# Patient Record
Sex: Female | Born: 1970 | Race: Black or African American | Hispanic: No | Marital: Married | Smoking: Former smoker
Health system: Southern US, Community
[De-identification: ages and names within clinical notes are randomized; demographics above are authoritative.]

## PROBLEM LIST (undated history)

## (undated) ENCOUNTER — Ambulatory Visit (HOSPITAL_COMMUNITY): Admission: EM

## (undated) DIAGNOSIS — R51 Headache: Secondary | ICD-10-CM

## (undated) DIAGNOSIS — I639 Cerebral infarction, unspecified: Secondary | ICD-10-CM

## (undated) DIAGNOSIS — I2510 Atherosclerotic heart disease of native coronary artery without angina pectoris: Secondary | ICD-10-CM

## (undated) DIAGNOSIS — I10 Essential (primary) hypertension: Secondary | ICD-10-CM

## (undated) DIAGNOSIS — E46 Unspecified protein-calorie malnutrition: Secondary | ICD-10-CM

## (undated) DIAGNOSIS — E8809 Other disorders of plasma-protein metabolism, not elsewhere classified: Secondary | ICD-10-CM

## (undated) DIAGNOSIS — Z87442 Personal history of urinary calculi: Secondary | ICD-10-CM

## (undated) DIAGNOSIS — I219 Acute myocardial infarction, unspecified: Secondary | ICD-10-CM

## (undated) DIAGNOSIS — E119 Type 2 diabetes mellitus without complications: Secondary | ICD-10-CM

## (undated) DIAGNOSIS — J189 Pneumonia, unspecified organism: Secondary | ICD-10-CM

## (undated) DIAGNOSIS — I214 Non-ST elevation (NSTEMI) myocardial infarction: Secondary | ICD-10-CM

## (undated) DIAGNOSIS — R519 Headache, unspecified: Secondary | ICD-10-CM

## (undated) DIAGNOSIS — S36209A Unspecified injury of unspecified part of pancreas, initial encounter: Secondary | ICD-10-CM

## (undated) HISTORY — DX: Unspecified protein-calorie malnutrition: E46

## (undated) HISTORY — DX: Atherosclerotic heart disease of native coronary artery without angina pectoris: I25.10

## (undated) HISTORY — PX: LAPAROSCOPIC CHOLECYSTECTOMY: SUR755

## (undated) HISTORY — PX: FRACTURE SURGERY: SHX138

## (undated) HISTORY — DX: Other disorders of plasma-protein metabolism, not elsewhere classified: E88.09

## (undated) HISTORY — DX: Non-ST elevation (NSTEMI) myocardial infarction: I21.4

## (undated) HISTORY — PX: TUBAL LIGATION: SHX77

---

## 2007-07-02 ENCOUNTER — Emergency Department (HOSPITAL_COMMUNITY): Admission: EM | Admit: 2007-07-02 | Discharge: 2007-07-03 | Payer: Self-pay | Admitting: Emergency Medicine

## 2007-07-30 HISTORY — PX: FACIAL RECONSTRUCTION SURGERY: SHX631

## 2007-08-08 ENCOUNTER — Emergency Department (HOSPITAL_COMMUNITY): Admission: EM | Admit: 2007-08-08 | Discharge: 2007-08-09 | Payer: Self-pay | Admitting: Emergency Medicine

## 2007-12-24 ENCOUNTER — Emergency Department (HOSPITAL_COMMUNITY): Admission: EM | Admit: 2007-12-24 | Discharge: 2007-12-25 | Payer: Self-pay | Admitting: Emergency Medicine

## 2008-03-22 ENCOUNTER — Emergency Department (HOSPITAL_COMMUNITY): Admission: EM | Admit: 2008-03-22 | Discharge: 2008-03-22 | Payer: Self-pay | Admitting: Emergency Medicine

## 2008-04-22 ENCOUNTER — Emergency Department (HOSPITAL_COMMUNITY): Admission: EM | Admit: 2008-04-22 | Discharge: 2008-04-22 | Payer: Self-pay | Admitting: Family Medicine

## 2008-05-26 ENCOUNTER — Emergency Department (HOSPITAL_COMMUNITY): Admission: EM | Admit: 2008-05-26 | Discharge: 2008-05-26 | Payer: Self-pay | Admitting: Emergency Medicine

## 2008-06-10 ENCOUNTER — Ambulatory Visit (HOSPITAL_COMMUNITY): Admission: RE | Admit: 2008-06-10 | Discharge: 2008-06-10 | Payer: Self-pay | Admitting: Oral Surgery

## 2008-06-27 ENCOUNTER — Emergency Department (HOSPITAL_COMMUNITY): Admission: EM | Admit: 2008-06-27 | Discharge: 2008-06-27 | Payer: Self-pay | Admitting: Emergency Medicine

## 2008-07-31 ENCOUNTER — Emergency Department (HOSPITAL_COMMUNITY): Admission: EM | Admit: 2008-07-31 | Discharge: 2008-07-31 | Payer: Self-pay | Admitting: Family Medicine

## 2008-11-25 ENCOUNTER — Emergency Department (HOSPITAL_COMMUNITY): Admission: EM | Admit: 2008-11-25 | Discharge: 2008-11-25 | Payer: Self-pay | Admitting: Emergency Medicine

## 2009-01-09 ENCOUNTER — Emergency Department (HOSPITAL_COMMUNITY): Admission: EM | Admit: 2009-01-09 | Discharge: 2009-01-09 | Payer: Self-pay | Admitting: Family Medicine

## 2009-08-29 ENCOUNTER — Emergency Department (HOSPITAL_COMMUNITY): Admission: EM | Admit: 2009-08-29 | Discharge: 2009-08-29 | Payer: Self-pay | Admitting: Family Medicine

## 2009-09-24 ENCOUNTER — Emergency Department (HOSPITAL_COMMUNITY): Admission: EM | Admit: 2009-09-24 | Discharge: 2009-09-24 | Payer: Self-pay | Admitting: Emergency Medicine

## 2010-04-07 ENCOUNTER — Emergency Department (HOSPITAL_COMMUNITY): Admission: EM | Admit: 2010-04-07 | Discharge: 2010-04-07 | Payer: Self-pay | Admitting: Emergency Medicine

## 2010-11-07 LAB — GLUCOSE, CAPILLARY: Glucose-Capillary: 172 mg/dL — ABNORMAL HIGH (ref 70–99)

## 2010-12-11 NOTE — Op Note (Signed)
[name], [name]             ACCOUNT NO.:  [id number]   MEDICAL RECORD NO.:  [id number]          PATIENT TYPE:  AMB   LOCATION:  SDS                          FACILITY:  [facility]   PHYSICIAN:  Georgia Lopes, M.D.  DATE OF BIRTH:  Aug 19, 1970   DATE OF PROCEDURE:  06/10/2008  DATE OF DISCHARGE:  06/10/2008                               OPERATIVE REPORT   PREOPERATIVE DIAGNOSIS:  Left zygoma fracture.   POSTOPERATIVE DIAGNOSIS:  Left zygoma fracture.   PROCEDURE:  Open reduction left zygomatic arch fracture.   SURGEON:  Georgia Lopes, MD   ANESTHESIA:  General oral.   ESTIMATED BLOOD LOSS:  Minimal.   COMPLICATIONS:  None.   SPECIMENS:  None.   PROCEDURE:  The patient was taken to the operating room and placed on  the table on supine position.  General anesthesia was administered  intravenously and oral endotracheal tube was placed and secured, the  eyelids are lubricated and protected.  The table was turned and the  patient was prepped and draped after shaving a small area in the  temporal region in the left side.  Then 1% lidocaine of 1:100,000  epinephrine was infiltrated total 4.5 mL in the left temporal region.  Then a #15 blade was used to make a 3-cm incision through the skin and  subcutaneous tissue.  Bleeding vessels were cauterized with Bovie  electrocautery.  Then dissection was carried down to the deep layer of  temporal fascia, and the plane was developed here at the wound  inferiorly under the zygomatic arch region using a Therapist, nutritional.  Then  a zygomatic arch elevator was placed so that the tip of the instrument  was underneath the depressed zygomatic arch fracture and bimanual  elevating pressure was used to reduce the fracture.  Then, the area was  cauterized and closed with 4-0 chromic and 4-0 Prolene for skin.  The  patient was extubated and taken to the recovery room breathing  spontaneously and in good condition.     ______________________________  Georgia Lopes, M.D.     SMJ/MEDQ  D:  06/10/2008  T:  06/10/2008  Job:  [id number]

## 2011-04-18 LAB — URINE CULTURE

## 2011-04-18 LAB — URINE MICROSCOPIC-ADD ON

## 2011-04-18 LAB — POCT CARDIAC MARKERS: Operator id: 288831

## 2011-04-18 LAB — DIFFERENTIAL
Basophils Relative: 0
Lymphocytes Relative: 37
Lymphs Abs: 3.8
Monocytes Absolute: 0.5
Monocytes Relative: 5

## 2011-04-18 LAB — CBC
HCT: 46.5 — ABNORMAL HIGH
Hemoglobin: 15.7 — ABNORMAL HIGH
MCV: 94

## 2011-04-18 LAB — BASIC METABOLIC PANEL
BUN: 7
Calcium: 9.5
GFR calc Af Amer: >60
GFR calc non Af Amer: >60

## 2011-04-18 LAB — URINALYSIS, ROUTINE W REFLEX MICROSCOPIC
Glucose, UA: >1000 — AB
Hgb urine dipstick: NEGATIVE
Protein, ur: NEGATIVE
Specific Gravity, Urine: 1.03
pH: 6

## 2011-04-24 LAB — CBC
HCT: 40.9
MCV: 94.1
RBC: 4.35
RDW: 13

## 2011-04-24 LAB — URINE MICROSCOPIC-ADD ON

## 2011-04-24 LAB — POCT I-STAT, CHEM 8
BUN: 8
Calcium, Ion: 1.13
Chloride: 101
Creatinine, Ser: 0.6
Glucose, Bld: 383 — ABNORMAL HIGH
HCT: 41
Hemoglobin: 13.9
Potassium: 2.9 — ABNORMAL LOW
Sodium: 139
TCO2: 27

## 2011-04-24 LAB — DIFFERENTIAL
Basophils Absolute: 0.1
Eosinophils Absolute: 0.2
Eosinophils Relative: 3
Lymphs Abs: 3.1

## 2011-04-24 LAB — HCG, QUANTITATIVE, PREGNANCY: hCG, Beta Chain, Quant, S: <2

## 2011-04-24 LAB — URINALYSIS, ROUTINE W REFLEX MICROSCOPIC
Bilirubin Urine: NEGATIVE
Ketones, ur: 40 — AB
Nitrite: NEGATIVE
pH: 7

## 2011-04-24 LAB — GC/CHLAMYDIA PROBE AMP, GENITAL
Chlamydia, DNA Probe: NEGATIVE
GC Probe Amp, Genital: NEGATIVE

## 2011-04-24 LAB — WET PREP, GENITAL: Yeast Wet Prep HPF POC: NONE SEEN

## 2011-04-29 LAB — GLUCOSE, CAPILLARY

## 2011-04-29 LAB — URINALYSIS, ROUTINE W REFLEX MICROSCOPIC
Bilirubin Urine: NEGATIVE
Hgb urine dipstick: NEGATIVE
Specific Gravity, Urine: 1.041 — ABNORMAL HIGH
pH: 6.5

## 2011-04-29 LAB — POCT I-STAT, CHEM 8
BUN: 9
Chloride: 106
Creatinine, Ser: 0.7
Glucose, Bld: 334 — ABNORMAL HIGH
Potassium: 3.9
Sodium: 140

## 2011-04-29 LAB — URINE MICROSCOPIC-ADD ON

## 2011-04-30 LAB — URINE MICROSCOPIC-ADD ON

## 2011-04-30 LAB — POCT CARDIAC MARKERS
CKMB, poc: <1 — ABNORMAL LOW
Myoglobin, poc: 37.3
Troponin i, poc: <0.05

## 2011-04-30 LAB — CBC
HCT: 47.1 — ABNORMAL HIGH
MCV: 93.7
MCV: 95.8
Platelets: 267
Platelets: 270
RBC: 4.86
RDW: 13.1
WBC: 5.1
WBC: 8.8

## 2011-04-30 LAB — BASIC METABOLIC PANEL
BUN: 6
Chloride: 104
Creatinine, Ser: 0.65
GFR calc non Af Amer: >60
Glucose, Bld: 261 — ABNORMAL HIGH
Potassium: 4

## 2011-04-30 LAB — URINALYSIS, ROUTINE W REFLEX MICROSCOPIC
Bilirubin Urine: NEGATIVE
Hgb urine dipstick: NEGATIVE
Ketones, ur: 15 — AB
Specific Gravity, Urine: >1.046 — ABNORMAL HIGH
pH: 6

## 2011-04-30 LAB — DIFFERENTIAL
Basophils Absolute: 0.1
Eosinophils Absolute: 0.2
Eosinophils Relative: 2
Lymphocytes Relative: 38
Monocytes Absolute: 0.4

## 2011-04-30 LAB — COMPREHENSIVE METABOLIC PANEL
ALT: 13
AST: 16
Albumin: 3.7
CO2: 25
Chloride: 105
Creatinine, Ser: 0.42
GFR calc Af Amer: >60
Potassium: 3.3 — ABNORMAL LOW
Sodium: 137
Total Bilirubin: 0.8

## 2011-04-30 LAB — GLUCOSE, CAPILLARY
Glucose-Capillary: 253 — ABNORMAL HIGH
Glucose-Capillary: 285 — ABNORMAL HIGH

## 2011-05-06 LAB — URINALYSIS, ROUTINE W REFLEX MICROSCOPIC
Bilirubin Urine: NEGATIVE
Ketones, ur: NEGATIVE
Leukocytes, UA: NEGATIVE
Nitrite: NEGATIVE
Specific Gravity, Urine: 1.043 — ABNORMAL HIGH
Urobilinogen, UA: 2 — ABNORMAL HIGH
pH: 6.5

## 2011-05-06 LAB — URINE MICROSCOPIC-ADD ON

## 2011-05-06 LAB — PREGNANCY, URINE: Preg Test, Ur: NEGATIVE

## 2012-05-03 ENCOUNTER — Emergency Department (HOSPITAL_COMMUNITY)
Admission: EM | Admit: 2012-05-03 | Discharge: 2012-05-03 | Disposition: A | Discharge status: Home / Self Care | Payer: Self-pay | Attending: Emergency Medicine | Admitting: Emergency Medicine

## 2012-05-03 ENCOUNTER — Encounter (HOSPITAL_COMMUNITY): Payer: Self-pay | Admitting: Emergency Medicine

## 2012-05-03 DIAGNOSIS — Z794 Long term (current) use of insulin: Secondary | ICD-10-CM | POA: Insufficient documentation

## 2012-05-03 DIAGNOSIS — F17200 Nicotine dependence, unspecified, uncomplicated: Secondary | ICD-10-CM | POA: Insufficient documentation

## 2012-05-03 DIAGNOSIS — I10 Essential (primary) hypertension: Secondary | ICD-10-CM | POA: Insufficient documentation

## 2012-05-03 DIAGNOSIS — Z79899 Other long term (current) drug therapy: Secondary | ICD-10-CM | POA: Insufficient documentation

## 2012-05-03 DIAGNOSIS — E119 Type 2 diabetes mellitus without complications: Secondary | ICD-10-CM | POA: Insufficient documentation

## 2012-05-03 DIAGNOSIS — K047 Periapical abscess without sinus: Secondary | ICD-10-CM | POA: Insufficient documentation

## 2012-05-03 HISTORY — DX: Essential (primary) hypertension: I10

## 2012-05-03 MED ORDER — OXYCODONE-ACETAMINOPHEN 5-325 MG PO TABS: 2.0000 | ORAL_TABLET | ORAL | Status: DC | PRN

## 2012-05-03 MED ORDER — LORATADINE 10 MG PO TABS: 10.0000 mg | ORAL_TABLET | Freq: Every day | ORAL | Status: DC

## 2012-05-03 MED ORDER — TRIAMCINOLONE ACETONIDE 0.1 % EX CREA: TOPICAL_CREAM | Freq: Four times a day (QID) | CUTANEOUS | Status: DC | PRN

## 2012-05-03 MED ORDER — PENICILLIN V POTASSIUM 500 MG PO TABS: 1000.0000 mg | ORAL_TABLET | Freq: Two times a day (BID) | ORAL | Status: DC

## 2012-05-03 MED ORDER — OXYCODONE-ACETAMINOPHEN 5-325 MG PO TABS
2.0000 | ORAL_TABLET | Freq: Once | ORAL | Status: AC
  Administered 2012-05-03: 2 via ORAL
  Filled 2012-05-03: qty 2

## 2012-05-03 MED ORDER — DIPHENHYDRAMINE HCL 25 MG PO TABS: 50.0000 mg | ORAL_TABLET | ORAL | Status: DC | PRN

## 2012-05-03 NOTE — ED Notes (Signed)
Pt sts left sided dental pain x 3 days and rash after taking OTC meds for toothache; pt noted to be htn at present but tearful with pain and sts takes meds for htn

## 2012-05-03 NOTE — ED Provider Notes (Signed)
History  This chart was scribed for Lindsey Horn, MD by Erskine Emery. This patient was seen in room TR10C/TR10C and the patient's care was started at 17:50.   CSN: [id number]  Arrival date & time 05/03/12  1537   None     Chief Complaint  Patient presents with  . Dental Pain    (Consider location/radiation/quality/duration/timing/severity/associated sxs/prior treatment) The history is provided by the patient. No language interpreter was used.  [name] is a 41 y.o. female who presents to the Emergency Department complaining of left sided dental pain for the past 3 days. Pt denies any associated emesis, chest pain, SOB, or abdominal pain. Pt reports she had a similar episode in February. Pt was cared for in [facility] and was supposed to have the tooth pulled but was never treated because the pain subsided on its own. Pt reports applying some toothache medicine topically that created a burning sensation and generalized rash so she ceased taking the medication. Pt has a h/o DM and HTN but is otherwise healthy.  Pt does not have a dentist.  Past Medical History  Diagnosis Date  . Diabetes mellitus   . Hypertension     History reviewed. No pertinent past surgical history.  History reviewed. No pertinent family history.  History  Substance Use Topics  . Smoking status: Current Every Day Smoker  . Smokeless tobacco: Not on file  . Alcohol Use: No    OB History    Grav Para Term Preterm Abortions TAB SAB Ect Mult Living                  Review of Systems 10 Systems reviewed and are negative for acute change except as noted in the HPI.   Allergies  Hydrochlorothiazide; Morphine and related; Sulfa antibiotics; and Eugenol  Home Medications   Current Outpatient Rx  Name Route Sig Dispense Refill  . EUGENOL SOLN Oral Take 1 application by mouth daily as needed. For tooth pain    . INSULIN GLARGINE 100 UNIT/ML [place] SOLN Subcutaneous Inject 25-75 Units into the skin 2  (two) times daily. 25 units in the am & 75 units as night    . INSULIN LISPRO (HUMAN) 100 UNIT/ML [place] SOLN Subcutaneous Inject 5-20 Units into the skin 3 (three) times daily before meals. Takes 5-20 units as directed per sliding scale    . LISINOPRIL 30 MG PO TABS Oral Take 30 mg by mouth daily.    Marland Kitchen PRAMOXINE-MENTHOL 1-1 % EX CREA Apply externally Apply 1 application topically daily as needed. For hives    . DIPHENHYDRAMINE HCL 25 MG PO TABS Oral Take 2 tablets (50 mg total) by mouth every 4 (four) hours as needed for itching. 20 tablet 0  . LORATADINE 10 MG PO TABS Oral Take 1 tablet (10 mg total) by mouth daily. One po daily x 5 days 5 tablet 0  . OXYCODONE-ACETAMINOPHEN 5-325 MG PO TABS Oral Take 2 tablets by mouth every 4 (four) hours as needed for pain. 20 tablet 0  . PENICILLIN V POTASSIUM 500 MG PO TABS Oral Take 2 tablets (1,000 mg total) by mouth 2 (two) times daily. X 7 days 28 tablet 0  . TRIAMCINOLONE ACETONIDE 0.1 % EX CREA Topical Apply topically 4 (four) times daily as needed. To rash up to 1 week 15 g 0    Triage Vitals: BP 203/110  Pulse 109  Temp 99.2 F (37.3 C) (Oral)  Resp 16  SpO2 99%  Physical Exam  Nursing note and vitals reviewed. Constitutional:       Awake, alert, nontoxic appearance.  HENT:  Head: Atraumatic.  Mouth/Throat: Oropharynx is clear and moist. No oropharyngeal exudate.       Tooth #13: tender to percussion with localized genvial tenderness and no fluctuants or gingival swelling.   Eyes: Right eye exhibits no discharge. Left eye exhibits no discharge.  Neck: Neck supple.  Cardiovascular: Normal rate, regular rhythm and normal heart sounds.   No murmur heard. Pulmonary/Chest: Effort normal and breath sounds normal. No respiratory distress. She has no wheezes. She exhibits no tenderness.  Abdominal: Soft. There is no tenderness. There is no rebound.  Musculoskeletal: She exhibits no tenderness.       Baseline ROM, no obvious new focal weakness.    Lymphadenopathy:    She has no cervical adenopathy.  Neurological:       Mental status and motor strength appears baseline for patient and situation.  Skin: No rash noted.       Tiny erythematous papules on forearms bilaterally.  Psychiatric: She has a normal mood and affect.    ED Course  Procedures (including critical care time) DIAGNOSTIC STUDIES: Oxygen Saturation is 99% on room air, normal by my interpretation.    COORDINATION OF CARE: 17:50--Patient / Family / Caregiver informed of clinical course, understand medical decision-making process, and agree with plan.   Labs Reviewed - No data to display No results found.   1. Dental abscess       MDM  Patient / Family / Caregiver informed of clinical course, understand medical decision-making process, and agree with plan. I personally performed the services described in this documentation, which was scribed in my presence. The recorded information has been reviewed and considered. I doubt any other EMC precluding discharge at this time including, but not necessarily limited to the following:airway compromise.    Lindsey Horn, MD 05/04/12 [phone]

## 2012-09-29 ENCOUNTER — Emergency Department (HOSPITAL_COMMUNITY)
Admission: EM | Admit: 2012-09-29 | Discharge: 2012-09-29 | Disposition: A | Discharge status: Home / Self Care | Payer: No Typology Code available for payment source | Attending: Emergency Medicine | Admitting: Emergency Medicine

## 2012-09-29 ENCOUNTER — Emergency Department (HOSPITAL_COMMUNITY): Payer: No Typology Code available for payment source

## 2012-09-29 ENCOUNTER — Encounter (HOSPITAL_COMMUNITY): Payer: Self-pay | Admitting: *Deleted

## 2012-09-29 DIAGNOSIS — Y9389 Activity, other specified: Secondary | ICD-10-CM | POA: Insufficient documentation

## 2012-09-29 DIAGNOSIS — E119 Type 2 diabetes mellitus without complications: Secondary | ICD-10-CM | POA: Insufficient documentation

## 2012-09-29 DIAGNOSIS — W010XXA Fall on same level from slipping, tripping and stumbling without subsequent striking against object, initial encounter: Secondary | ICD-10-CM | POA: Insufficient documentation

## 2012-09-29 DIAGNOSIS — I10 Essential (primary) hypertension: Secondary | ICD-10-CM | POA: Insufficient documentation

## 2012-09-29 DIAGNOSIS — S20229A Contusion of unspecified back wall of thorax, initial encounter: Secondary | ICD-10-CM | POA: Insufficient documentation

## 2012-09-29 DIAGNOSIS — Y929 Unspecified place or not applicable: Secondary | ICD-10-CM | POA: Insufficient documentation

## 2012-09-29 DIAGNOSIS — Z794 Long term (current) use of insulin: Secondary | ICD-10-CM | POA: Insufficient documentation

## 2012-09-29 DIAGNOSIS — W1809XA Striking against other object with subsequent fall, initial encounter: Secondary | ICD-10-CM | POA: Insufficient documentation

## 2012-09-29 DIAGNOSIS — F17200 Nicotine dependence, unspecified, uncomplicated: Secondary | ICD-10-CM | POA: Insufficient documentation

## 2012-09-29 DIAGNOSIS — Z79899 Other long term (current) drug therapy: Secondary | ICD-10-CM | POA: Insufficient documentation

## 2012-09-29 DIAGNOSIS — S0990XA Unspecified injury of head, initial encounter: Secondary | ICD-10-CM | POA: Insufficient documentation

## 2012-09-29 MED ORDER — HYDROCODONE-ACETAMINOPHEN 5-325 MG PO TABS
2.0000 | ORAL_TABLET | Freq: Once | ORAL | Status: AC
  Administered 2012-09-29: 2 via ORAL
  Filled 2012-09-29: qty 2

## 2012-09-29 MED ORDER — NAPROXEN 375 MG PO TABS: 375.0000 mg | ORAL_TABLET | Freq: Two times a day (BID) | ORAL | Status: DC | PRN

## 2012-09-29 NOTE — ED Notes (Signed)
PT was stepping out of her tahoe and slipped on ice, falling back and hitting the step on her truck with the back of her head/neck.  No loc.  Presently c/o of occipital pain, neck and shoulder pain.

## 2012-10-01 ENCOUNTER — Emergency Department (HOSPITAL_COMMUNITY): Payer: No Typology Code available for payment source

## 2012-10-01 ENCOUNTER — Encounter (HOSPITAL_COMMUNITY): Payer: Self-pay

## 2012-10-01 ENCOUNTER — Emergency Department (HOSPITAL_COMMUNITY)
Admission: EM | Admit: 2012-10-01 | Discharge: 2012-10-01 | Disposition: A | Discharge status: Home / Self Care | Payer: No Typology Code available for payment source | Attending: Emergency Medicine | Admitting: Emergency Medicine

## 2012-10-01 DIAGNOSIS — Y939 Activity, unspecified: Secondary | ICD-10-CM | POA: Insufficient documentation

## 2012-10-01 DIAGNOSIS — Z794 Long term (current) use of insulin: Secondary | ICD-10-CM | POA: Insufficient documentation

## 2012-10-01 DIAGNOSIS — Y929 Unspecified place or not applicable: Secondary | ICD-10-CM | POA: Insufficient documentation

## 2012-10-01 DIAGNOSIS — IMO0002 Reserved for concepts with insufficient information to code with codable children: Secondary | ICD-10-CM | POA: Insufficient documentation

## 2012-10-01 DIAGNOSIS — I10 Essential (primary) hypertension: Secondary | ICD-10-CM | POA: Insufficient documentation

## 2012-10-01 DIAGNOSIS — S46909A Unspecified injury of unspecified muscle, fascia and tendon at shoulder and upper arm level, unspecified arm, initial encounter: Secondary | ICD-10-CM | POA: Insufficient documentation

## 2012-10-01 DIAGNOSIS — F17200 Nicotine dependence, unspecified, uncomplicated: Secondary | ICD-10-CM | POA: Insufficient documentation

## 2012-10-01 DIAGNOSIS — W1809XA Striking against other object with subsequent fall, initial encounter: Secondary | ICD-10-CM | POA: Insufficient documentation

## 2012-10-01 DIAGNOSIS — E119 Type 2 diabetes mellitus without complications: Secondary | ICD-10-CM | POA: Insufficient documentation

## 2012-10-01 DIAGNOSIS — R51 Headache: Secondary | ICD-10-CM | POA: Insufficient documentation

## 2012-10-01 DIAGNOSIS — M542 Cervicalgia: Secondary | ICD-10-CM | POA: Insufficient documentation

## 2012-10-01 DIAGNOSIS — W010XXA Fall on same level from slipping, tripping and stumbling without subsequent striking against object, initial encounter: Secondary | ICD-10-CM | POA: Insufficient documentation

## 2012-10-01 DIAGNOSIS — Z79899 Other long term (current) drug therapy: Secondary | ICD-10-CM | POA: Insufficient documentation

## 2012-10-01 MED ORDER — LISINOPRIL 20 MG PO TABS: 10.0000 mg | ORAL_TABLET | Freq: Every day | ORAL | Status: DC

## 2012-10-01 MED ORDER — TRAMADOL HCL 50 MG PO TABS: 50.0000 mg | ORAL_TABLET | Freq: Four times a day (QID) | ORAL | Status: DC | PRN

## 2012-10-01 NOTE — Progress Notes (Signed)
Orthopedic Tech Progress Note Patient Details:  [name] 04-12-1971 [id number]  Ortho Devices Ortho Device/Splint Location: (R) UE Ortho Device/Splint Interventions: Application   Jennye Moccasin 10/01/2012, 3:37 PM

## 2012-10-01 NOTE — ED Provider Notes (Signed)
History    42 year old female presenting after a slip and fall on the ice. Patient fell backwards and struck the back of her head against running board of her SUV and fell onto her butt. Posterior headache, neck and butt pain since then. No loss of consciousness. No acute visual changes. No use of blood thinning medication. No nausea or vomiting. Patient has been in Lip[facility] he since her accident with little difficulty. No intervention prior to arrival.   CSN: [id number]  Arrival date & time 09/29/12  1205   First MD Initiated Contact with Patient 09/29/12 1248      Chief Complaint  Patient presents with  . Fall    (Consider location/radiation/quality/duration/timing/severity/associated sxs/prior treatment) HPI  Past Medical History  Diagnosis Date  . Diabetes mellitus   . Hypertension     Past Surgical History  Procedure Laterality Date  . Cholecystectomy    . Tubal ligation      No family history on file.  History  Substance Use Topics  . Smoking status: Current Every Day Smoker -- 0.50 packs/day    Types: Cigarettes  . Smokeless tobacco: Not on file  . Alcohol Use: No    OB History   Grav Para Term Preterm Abortions TAB SAB Ect Mult Living                  Review of Systems  All systems reviewed and negative, other than as noted in HPI.   Allergies  Hydrochlorothiazide; Morphine and related; Sulfa antibiotics; and Eugenol  Home Medications   Current Outpatient Rx  Name  Route  Sig  Dispense  Refill  . Ascorbic Acid (VITAMIN C PO)   Oral   Take 1 tablet by mouth daily.         . Cyanocobalamin (VITAMIN B12 PO)   Oral   Take 1 tablet by mouth daily.         . insulin glargine (LANTUS) 100 UNIT/ML injection   Subcutaneous   Inject 25-75 Units into the skin 2 (two) times daily. 25 units in the am & 75 units as night         . insulin lispro (HUMALOG) 100 UNIT/ML injection   Subcutaneous   Inject 5-20 Units into the skin 3 (three) times daily  before meals. Takes 5-20 units as directed per sliding scale         . lisinopril (PRINIVIL,ZESTRIL) 30 MG tablet   Oral   Take 30 mg by mouth daily.         Marland Kitchen OVER THE COUNTER MEDICATION   Oral   Take 1 tablet by mouth daily. Sinus tablet         . OVER THE COUNTER MEDICATION   Nasal   Place 2 sprays into the nose 2 (two) times daily. Sinus nasal spray         . naproxen (NAPROSYN) 375 MG tablet   Oral   Take 1 tablet (375 mg total) by mouth 2 (two) times daily as needed (pain).   20 tablet   0     BP 204/111  Pulse 95  Temp(Src) 99.3 F (37.4 C) (Oral)  Resp 16  SpO2 98%  LMP 09/15/2012  Physical Exam  Nursing note and vitals reviewed. Constitutional: She is oriented to person, place, and time. She appears well-developed and well-nourished. No distress.  HENT:  Head: Normocephalic and atraumatic.  Eyes: Conjunctivae are normal. Right eye exhibits no discharge. Left eye exhibits no discharge.  Neck: Neck supple.  Cardiovascular: Normal rate, regular rhythm and normal heart sounds.  Exam reveals no gallop and no friction rub.   No murmur heard. Pulmonary/Chest: Effort normal and breath sounds normal. No respiratory distress.  Abdominal: Soft. She exhibits no distension. There is no tenderness.  Musculoskeletal: She exhibits no edema and no tenderness.  Mild blind mid cervical cervical spine tenderness. Tenderness over the sacrum and coccygeal region.  Neurological: She is alert and oriented to person, place, and time. No cranial nerve deficit. She exhibits normal muscle tone. Coordination normal.  Skin: Skin is warm and dry.  Psychiatric: She has a normal mood and affect. Her behavior is normal. Thought content normal.    ED Course  Procedures (including critical care time)  Labs Reviewed - No data to display No results found.  Dg Sacrum/coccyx  09/29/2012  *RADIOLOGY REPORT*  Clinical Data: Fall, pain.  SACRUM AND COCCYX - 2+ VIEW  Comparison: None.   Findings: Imaged bones, joints and soft tissues appear normal.  IMPRESSION: Negative exam.   Original Report Authenticated By: Holley Dexter, M.D.    Ct Head Wo Contrast  09/29/2012  *RADIOLOGY REPORT*  Clinical Data:  Fall.  Occipital headache. Posterior neck pain.  CT HEAD WITHOUT e  CONTRAST CT CERVICAL SPINE WITHOUT CONTRAST  Technique:  Multidetector CT imaging of the head and cervical spine was performed following the standard protocol without intravenous contrast.  Multiplanar CT image reconstructions of the cervical spine were also generated.  Comparison:  05/26/2008  CT HEAD  Findings: No skull fracture or intracranial hemorrhage.  No intracranial mass lesion detected on this unenhanced exam.  No CT evidence of large acute infarct.  IMPRESSION: No skull fracture or intracranial hemorrhage.  CT CERVICAL SPINE  Findings: No acute cervical spine fracture.  Mild straightening of the cervical spine may be related to head positioning or spasm.  Advanced atherosclerotic type changes with calcification carotid bifurcation.  IMPRESSION: No acute cervical spine fracture.  Please see above.   Original Report Authenticated By: Lacy Duverney, M.D.    Ct Cervical Spine Wo Contrast  09/29/2012  *RADIOLOGY REPORT*  Clinical Data:  Fall.  Occipital headache. Posterior neck pain.  CT HEAD WITHOUT e  CONTRAST CT CERVICAL SPINE WITHOUT CONTRAST  Technique:  Multidetector CT imaging of the head and cervical spine was performed following the standard protocol without intravenous contrast.  Multiplanar CT image reconstructions of the cervical spine were also generated.  Comparison:  05/26/2008  CT HEAD  Findings: No skull fracture or intracranial hemorrhage.  No intracranial mass lesion detected on this unenhanced exam.  No CT evidence of large acute infarct.  IMPRESSION: No skull fracture or intracranial hemorrhage.  CT CERVICAL SPINE  Findings: No acute cervical spine fracture.  Mild straightening of the cervical spine  may be related to head positioning or spasm.  Advanced atherosclerotic type changes with calcification carotid bifurcation.  IMPRESSION: No acute cervical spine fracture.  Please see above.   Original Report Authenticated By: Lacy Duverney, M.D.     1. Minor head injury, initial encounter   2. Back contusion, unspecified laterality, initial encounter       MDM  41-y/o female with head, neck and lower back pain after mechanical fall. Nonfocal neurological examination. Imaging unremarkable. Suspect contusion/strain. Head instructions were discussed. As needed NSAIDs. Emergent return cautions discussed.        Raeford Razor, MD 10/01/12 [phone]

## 2012-10-01 NOTE — ED Notes (Signed)
Returned from xray

## 2012-10-01 NOTE — ED Provider Notes (Signed)
History     CSN: [id number]  Arrival date & time 10/01/12  1300   First MD Initiated Contact with Patient 10/01/12 1314      No chief complaint on file.   (Consider location/radiation/quality/duration/timing/severity/associated sxs/prior treatment) HPI Comments: 42 y/o female presents to the ED with worsening right shoulder and lower back pain x 2 days after a fall. She slipped on ice after getting out of her car falling on her tailbone and hitting her head posteriorly against the step of the car.  Was seen in the ED 2 days ago for the same complaint and prescribed naproxen which has not provided any relief. Shoulder pain is 10/10, constant, "sharp and shooting" radiating to the right shoulder blade aggravated with shoulder movement. Lower back pain is 6/10, dull and constant. Also states worsening 8/10, throbbing headache at back of head. Denies syncope, fever, chills, shortness of breath, urinary/bowel incontinence. Imaging done two days ago unremarkable. She had xray of her sacrum/coccyx, CT head and CT c-spine. No shoulder imaging performed at that time.  The history is provided by the patient.    Past Medical History  Diagnosis Date  . Diabetes mellitus   . Hypertension     Past Surgical History  Procedure Laterality Date  . Cholecystectomy    . Tubal ligation      History reviewed. No pertinent family history.  History  Substance Use Topics  . Smoking status: Current Every Day Smoker -- 0.50 packs/day    Types: Cigarettes  . Smokeless tobacco: Not on file  . Alcohol Use: No    OB History   Grav Para Term Preterm Abortions TAB SAB Ect Mult Living                  Review of Systems  Constitutional: Negative for fever and chills.  HENT: Positive for neck pain. Negative for neck stiffness.   Eyes: Negative for visual disturbance.  Respiratory: Negative for shortness of breath.   Cardiovascular: Negative for chest pain.  Musculoskeletal: Positive for back pain and  arthralgias (positive for right shoulder pain).  Neurological: Positive for headaches. Negative for dizziness and syncope.  Psychiatric/Behavioral: Negative for confusion.  All other systems reviewed and are negative.    Allergies  Hydrochlorothiazide; Morphine and related; Sulfa antibiotics; and Eugenol  Home Medications   Current Outpatient Rx  Name  Route  Sig  Dispense  Refill  . Ascorbic Acid (VITAMIN C PO)   Oral   Take 1 tablet by mouth daily.         . Cyanocobalamin (VITAMIN B12 PO)   Oral   Take 1 tablet by mouth daily.         . insulin glargine (LANTUS) 100 UNIT/ML injection   Subcutaneous   Inject 25-75 Units into the skin 2 (two) times daily. 25 units in the am & 75 units as night         . insulin lispro (HUMALOG) 100 UNIT/ML injection   Subcutaneous   Inject 5-20 Units into the skin 3 (three) times daily before meals. Takes 5-20 units as directed per sliding scale         . lisinopril (PRINIVIL,ZESTRIL) 30 MG tablet   Oral   Take 30 mg by mouth daily.         . naproxen (NAPROSYN) 375 MG tablet   Oral   Take 1 tablet (375 mg total) by mouth 2 (two) times daily as needed (pain).   20 tablet  0   . naproxen sodium (ANAPROX) 220 MG tablet   Oral   Take 220 mg by mouth 2 (two) times daily as needed (for pain).         Marland Kitchen OVER THE COUNTER MEDICATION   Oral   Take 1 tablet by mouth daily. Sinus tablet         . OVER THE COUNTER MEDICATION   Nasal   Place 2 sprays into the nose 2 (two) times daily. Sinus nasal spray           BP 210/103  Pulse 83  Temp(Src) 98.6 F (37 C) (Oral)  Resp 20  SpO2 98%  LMP 09/15/2012  Physical Exam  Nursing note and vitals reviewed. Constitutional: She is oriented to person, place, and time. She appears well-developed and well-nourished. No distress.  HENT:  Head: Normocephalic and atraumatic.  Mouth/Throat: Oropharynx is clear and moist.  Eyes: Conjunctivae and EOM are normal.  Neck: Normal  range of motion. Neck supple.  Full ROM of neck, however pain with movement.   Cardiovascular: Normal rate, regular rhythm, normal heart sounds and intact distal pulses.   Pulmonary/Chest: Effort normal and breath sounds normal. No respiratory distress.  Abdominal: Soft. Bowel sounds are normal. There is no tenderness.  Musculoskeletal: She exhibits no edema.  Decreased R shoulder ROM with flexion to 110 degrees due to pain. Pain with lateral rotation. Full ROM of left shoulder. Normal grip strength bilateral. Tenderness to palpation of R trapezius with spasm noted. Tenderness to palpation of lumbosacral and coccygeal region.  Neurological: She is alert and oriented to person, place, and time. She has normal strength. No cranial nerve deficit or sensory deficit. Coordination normal.  Sensation intact bilaterally. Normal gait.  Skin: Skin is warm and dry. She is not diaphoretic.  Psychiatric: She has a normal mood and affect. Her behavior is normal.    ED Course  Procedures (including critical care time)  Labs Reviewed - No data to display Dg Sacrum/coccyx  09/29/2012  *RADIOLOGY REPORT*  Clinical Data: Fall, pain.  SACRUM AND COCCYX - 2+ VIEW  Comparison: None.  Findings: Imaged bones, joints and soft tissues appear normal.  IMPRESSION: Negative exam.   Original Report Authenticated By: Holley Dexter, M.D.    Ct Head Wo Contrast  09/29/2012  *RADIOLOGY REPORT*  Clinical Data:  Fall.  Occipital headache. Posterior neck pain.  CT HEAD WITHOUT e  CONTRAST CT CERVICAL SPINE WITHOUT CONTRAST  Technique:  Multidetector CT imaging of the head and cervical spine was performed following the standard protocol without intravenous contrast.  Multiplanar CT image reconstructions of the cervical spine were also generated.  Comparison:  05/26/2008  CT HEAD  Findings: No skull fracture or intracranial hemorrhage.  No intracranial mass lesion detected on this unenhanced exam.  No CT evidence of large acute  infarct.  IMPRESSION: No skull fracture or intracranial hemorrhage.  CT CERVICAL SPINE  Findings: No acute cervical spine fracture.  Mild straightening of the cervical spine may be related to head positioning or spasm.  Advanced atherosclerotic type changes with calcification carotid bifurcation.  IMPRESSION: No acute cervical spine fracture.  Please see above.   Original Report Authenticated By: Lacy Duverney, M.D.    Ct Cervical Spine Wo Contrast  09/29/2012  *RADIOLOGY REPORT*  Clinical Data:  Fall.  Occipital headache. Posterior neck pain.  CT HEAD WITHOUT e  CONTRAST CT CERVICAL SPINE WITHOUT CONTRAST  Technique:  Multidetector CT imaging of the head and cervical spine was performed  following the standard protocol without intravenous contrast.  Multiplanar CT image reconstructions of the cervical spine were also generated.  Comparison:  05/26/2008  CT HEAD  Findings: No skull fracture or intracranial hemorrhage.  No intracranial mass lesion detected on this unenhanced exam.  No CT evidence of large acute infarct.  IMPRESSION: No skull fracture or intracranial hemorrhage.  CT CERVICAL SPINE  Findings: No acute cervical spine fracture.  Mild straightening of the cervical spine may be related to head positioning or spasm.  Advanced atherosclerotic type changes with calcification carotid bifurcation.  IMPRESSION: No acute cervical spine fracture.  Please see above.   Original Report Authenticated By: Lacy Duverney, M.D.      1. Fall, subsequent encounter   2. Shoulder pain, right   3. Hypertension       MDM  42 year old female with continuing pain after being discharged 2 days ago after a fall. No abnormalities seen on initial imaging at that visit. I did obtain imaging of her shoulder today which was normal. I will discharge her with Ultram as I do not fill narcotic pain medication as necessary at this time. Sling provided. Her blood pressure is also elevated today and she has not been taking the  lisinopril for "very long time". I placed her back on lisinopril. She does not have a PCP, resource guide given. Importance of followup explained in detail. Patient states understanding of plan and is agreeable. She is stable for discharge.        Trevor Mace, PA-C 10/01/12 1522  Trevor Mace, PA-C 10/01/12 1527

## 2012-10-01 NOTE — ED Notes (Signed)
Right shoulder pain since slipping in the ice Tuesday. Landed on concrete surface.

## 2012-10-01 NOTE — ED Notes (Signed)
Pt presents with R shoulder pain after slipping on ice on Tuesday.  Pt reports she landed on her back, struck her head on step of SUV.  -LOC, pt reports she was holding onto door handle with fall pulling R arm.  Pt reports pain from base of her neck into R shoulder and into axilla.

## 2012-10-02 NOTE — ED Provider Notes (Signed)
Medical screening examination/treatment/procedure(s) were performed by non-physician practitioner and as supervising physician I was immediately available for consultation/collaboration.    Senay Sistrunk D Brytani Voth, MD 10/02/12 0433 

## 2013-06-14 ENCOUNTER — Encounter (HOSPITAL_COMMUNITY): Payer: Self-pay | Admitting: Emergency Medicine

## 2013-06-14 ENCOUNTER — Emergency Department (HOSPITAL_COMMUNITY)
Admission: EM | Admit: 2013-06-14 | Discharge: 2013-06-14 | Discharge status: Against Medical Advice | Payer: Self-pay | Attending: Emergency Medicine | Admitting: Emergency Medicine

## 2013-06-14 DIAGNOSIS — I10 Essential (primary) hypertension: Secondary | ICD-10-CM | POA: Insufficient documentation

## 2013-06-14 DIAGNOSIS — E119 Type 2 diabetes mellitus without complications: Secondary | ICD-10-CM | POA: Insufficient documentation

## 2013-06-14 DIAGNOSIS — F17200 Nicotine dependence, unspecified, uncomplicated: Secondary | ICD-10-CM | POA: Insufficient documentation

## 2013-06-14 LAB — CBC WITH DIFFERENTIAL/PLATELET
Basophils Relative: 0 % (ref 0–1)
Eosinophils Absolute: 0.2 10*3/uL (ref 0.0–0.7)
Eosinophils Relative: 2 % (ref 0–5)
Hemoglobin: 14.5 g/dL (ref 12.0–15.0)
Lymphs Abs: 3.4 10*3/uL (ref 0.7–4.0)
MCH: 31.4 pg (ref 26.0–34.0)
MCHC: 36.2 g/dL — ABNORMAL HIGH (ref 30.0–36.0)
MCV: 86.8 fL (ref 78.0–100.0)
Monocytes Absolute: 0.4 10*3/uL (ref 0.1–1.0)
Monocytes Relative: 4 % (ref 3–12)
Neutrophils Relative %: 59 % (ref 43–77)
RBC: 4.62 MIL/uL (ref 3.87–5.11)

## 2013-06-14 LAB — COMPREHENSIVE METABOLIC PANEL
Albumin: 3.9 g/dL (ref 3.5–5.2)
Alkaline Phosphatase: 83 U/L (ref 39–117)
BUN: 12 mg/dL (ref 6–23)
Calcium: 9.2 mg/dL (ref 8.4–10.5)
Creatinine, Ser: 0.51 mg/dL (ref 0.50–1.10)
GFR calc Af Amer: >90 mL/min (ref >90)
Glucose, Bld: 405 mg/dL — ABNORMAL HIGH (ref 70–99)
Total Protein: 7.4 g/dL (ref 6.0–8.3)

## 2013-06-14 NOTE — ED Notes (Signed)
The pt states she does not want to wait any longer, I explained to the pt that we are getting a bed ready for her and that her labs have resulted but she says she will return later if she continues to feel bad

## 2013-06-14 NOTE — ED Notes (Signed)
Pt c/o htn today while at follow up visit at [facility] and sent here for eval; pt c/o generalized HA x years

## 2013-07-17 ENCOUNTER — Encounter (HOSPITAL_COMMUNITY): Payer: Self-pay | Admitting: Emergency Medicine

## 2013-07-17 ENCOUNTER — Emergency Department (HOSPITAL_COMMUNITY)
Admission: EM | Admit: 2013-07-17 | Discharge: 2013-07-17 | Disposition: A | Discharge status: Home / Self Care | Payer: Self-pay | Attending: Emergency Medicine | Admitting: Emergency Medicine

## 2013-07-17 DIAGNOSIS — I10 Essential (primary) hypertension: Secondary | ICD-10-CM | POA: Insufficient documentation

## 2013-07-17 DIAGNOSIS — Z794 Long term (current) use of insulin: Secondary | ICD-10-CM | POA: Insufficient documentation

## 2013-07-17 DIAGNOSIS — Z79899 Other long term (current) drug therapy: Secondary | ICD-10-CM | POA: Insufficient documentation

## 2013-07-17 DIAGNOSIS — H538 Other visual disturbances: Secondary | ICD-10-CM | POA: Insufficient documentation

## 2013-07-17 DIAGNOSIS — F17200 Nicotine dependence, unspecified, uncomplicated: Secondary | ICD-10-CM | POA: Insufficient documentation

## 2013-07-17 DIAGNOSIS — E119 Type 2 diabetes mellitus without complications: Secondary | ICD-10-CM | POA: Insufficient documentation

## 2013-07-17 DIAGNOSIS — R51 Headache: Secondary | ICD-10-CM | POA: Insufficient documentation

## 2013-07-17 NOTE — ED Provider Notes (Signed)
Medical screening examination/treatment/procedure(s) were performed by non-physician practitioner and as supervising physician I was immediately available for consultation/collaboration.    Olivia Mackie, MD 07/17/13 [phone]

## 2013-07-17 NOTE — ED Notes (Signed)
The pt is c/o a headache  .  She was off her bp meds for 2-3 months .  She just started back on the same meds a month ago.  She no longer has a regular doctor.  She saw a doctor in [place] in a free standing clinic.  She has had a headache every day for one month

## 2013-07-17 NOTE — ED Provider Notes (Signed)
CSN: [id number]     Arrival date & time 07/17/13  1308 History   First MD Initiated Contact with Patient 07/17/13 0601     Chief Complaint  Patient presents with  . Hypertension   (Consider location/radiation/quality/duration/timing/severity/associated sxs/prior Treatment) HPI Comments: Patient is a 42 year old female with past medical history of diabetes hypertension who presents to the emergency department complaining of headaches intermittently over the past 2-3 months. She was off of her blood pressure medications for about 2 months, went to an urgent care and was started back up on lisinopril. She does not have a primary care physician. Headaches have been on-and-off throughout the day, worse at night. With the headaches she occasionally has blurry vision. Denies chest pain, shortness of breath, nausea, vomiting, abdominal pain, weakness. Admits to lower extremity swelling, worse at night, however states this is nothing new. It is noted on arrival to the ED her blood pressure was 185/104, rechecked a few minutes later once she sat down and was 179/88.  Patient is a 42 y.o. female presenting with hypertension. The history is provided by the patient.  Hypertension Associated symptoms include headaches.    Past Medical History  Diagnosis Date  . Diabetes mellitus   . Hypertension    Past Surgical History  Procedure Laterality Date  . Cholecystectomy    . Tubal ligation     No family history on file. History  Substance Use Topics  . Smoking status: Current Every Day Smoker -- 0.50 packs/day    Types: Cigarettes  . Smokeless tobacco: Not on file  . Alcohol Use: No   OB History   Grav Para Term Preterm Abortions TAB SAB Ect Mult Living                 Review of Systems  Eyes: Positive for visual disturbance.  Neurological: Positive for headaches.  All other systems reviewed and are negative.    Allergies  Hydrochlorothiazide; Morphine and related; Sulfa antibiotics;  Eugenol; and Percocet  Home Medications   Current Outpatient Rx  Name  Route  Sig  Dispense  Refill  . insulin glargine (LANTUS) 100 UNIT/ML injection   Subcutaneous   Inject 25-75 Units into the skin 2 (two) times daily. 25 units in the am & 75 units as night         . insulin lispro (HUMALOG) 100 UNIT/ML injection   Subcutaneous   Inject 5-20 Units into the skin 3 (three) times daily before meals. Takes 5-20 units as directed per sliding scale         . lisinopril (PRINIVIL,ZESTRIL) 30 MG tablet   Oral   Take 30 mg by mouth daily.          BP 160/91  Pulse 81  Temp(Src) 98.8 F (37.1 C)  Resp 18  Ht 4' 11.5" (1.511 m)  Wt 202 lb (91.627 kg)  BMI 40.13 kg/m2  SpO2 98%  LMP 07/07/2013 Physical Exam  Nursing note and vitals reviewed. Constitutional: She is oriented to person, place, and time. She appears well-developed and well-nourished. No distress.  HENT:  Head: Normocephalic and atraumatic.  Mouth/Throat: Oropharynx is clear and moist.  Eyes: Conjunctivae and EOM are normal. Pupils are equal, round, and reactive to light.  Neck: Normal range of motion. Neck supple.  Cardiovascular: Normal rate, regular rhythm and normal heart sounds.   Pulmonary/Chest: Effort normal and breath sounds normal.  Musculoskeletal: Normal range of motion. She exhibits no edema.  Neurological: She is alert and oriented  to person, place, and time. She has normal strength. No cranial nerve deficit or sensory deficit. She displays a negative Romberg sign. Coordination and gait normal.  Skin: Skin is warm and dry. She is not diaphoretic.  Psychiatric: She has a normal mood and affect. Her behavior is normal.    ED Course  Procedures (including critical care time) Labs Review Labs Reviewed - No data to display Imaging Review No results found.  EKG Interpretation   None       MDM   1. Hypertension   2. Headache    Patient presenting with hypertension and headaches. She is  well appearing and in no apparent distress. Blood pressure 185/104 initially, a few minutes later 179/88 without any intervention. Blood pressure remained stable throughout entire visit, 160/91 one hour later. No red flags concerning patient's headache, unremarkable neurologic exam, no focal deficits, afebrile, no meningeal signs. No chest pain. She is stable for discharge, advised her to followup at the wellness clinic as she does not have any insurance at this time. Continue taking her lisinopril, monitor blood pressure at home. Return precautions given. Patient states understanding of treatment care plan and is agreeable.     Trevor Mace, PA-C 07/17/13 [phone]

## 2013-07-29 ENCOUNTER — Encounter (HOSPITAL_COMMUNITY): Payer: Self-pay | Admitting: Emergency Medicine

## 2013-07-29 ENCOUNTER — Emergency Department (HOSPITAL_COMMUNITY)
Admission: EM | Admit: 2013-07-29 | Discharge: 2013-07-29 | Disposition: A | Discharge status: Home / Self Care | Payer: Self-pay | Attending: Emergency Medicine | Admitting: Emergency Medicine

## 2013-07-29 DIAGNOSIS — Z79899 Other long term (current) drug therapy: Secondary | ICD-10-CM | POA: Insufficient documentation

## 2013-07-29 DIAGNOSIS — I10 Essential (primary) hypertension: Secondary | ICD-10-CM

## 2013-07-29 DIAGNOSIS — Z794 Long term (current) use of insulin: Secondary | ICD-10-CM | POA: Insufficient documentation

## 2013-07-29 DIAGNOSIS — E119 Type 2 diabetes mellitus without complications: Secondary | ICD-10-CM | POA: Insufficient documentation

## 2013-07-29 DIAGNOSIS — F17200 Nicotine dependence, unspecified, uncomplicated: Secondary | ICD-10-CM | POA: Insufficient documentation

## 2013-07-29 DIAGNOSIS — Z202 Contact with and (suspected) exposure to infections with a predominantly sexual mode of transmission: Secondary | ICD-10-CM

## 2013-07-29 MED ORDER — AZITHROMYCIN 250 MG PO TABS
1000.0000 mg | ORAL_TABLET | Freq: Once | ORAL | Status: AC
  Administered 2013-07-29: 1000 mg via ORAL
  Filled 2013-07-29: qty 4

## 2013-07-29 MED ORDER — LISINOPRIL 30 MG PO TABS: 30.0000 mg | ORAL_TABLET | Freq: Every day | ORAL | Status: DC

## 2013-07-29 MED ORDER — AMLODIPINE BESYLATE 5 MG PO TABS: 5.0000 mg | ORAL_TABLET | Freq: Every day | ORAL | Status: DC

## 2013-07-29 NOTE — ED Notes (Signed)
Pt states her husband was dx with chlamydia and needs a prescription for same.  Pt in NAD, A&O.

## 2013-07-29 NOTE — Discharge Instructions (Signed)
Hypertension As your heart beats, it forces blood through your arteries. This force is your blood pressure. If the pressure is too high, it is called hypertension (HTN) or high blood pressure. HTN is dangerous because you may have it and not know it. High blood pressure may mean that your heart has to work harder to pump blood. Your arteries may be narrow or stiff. The extra work puts you at risk for heart disease, stroke, and other problems.  Blood pressure consists of two numbers, a higher number over a lower, 110/72, for example. It is stated as "110 over 72." The ideal is below 120 for the top number (systolic) and under 80 for the bottom (diastolic). Write down your blood pressure today. You should pay close attention to your blood pressure if you have certain conditions such as:  Heart failure.  Prior heart attack.  Diabetes  Chronic kidney disease.  Prior stroke.  Multiple risk factors for heart disease. To see if you have HTN, your blood pressure should be measured while you are seated with your arm held at the level of the heart. It should be measured at least twice. A one-time elevated blood pressure reading (especially in the Emergency Department) does not mean that you need treatment. There may be conditions in which the blood pressure is different between your right and left arms. It is important to see your caregiver soon for a recheck. Most people have essential hypertension which means that there is not a specific cause. This type of high blood pressure may be lowered by changing lifestyle factors such as:  Stress.  Smoking.  Lack of exercise.  Excessive weight.  Drug/tobacco/alcohol use.  Eating less salt. Most people do not have symptoms from high blood pressure until it has caused damage to the body. Effective treatment can often prevent, delay or reduce that damage. TREATMENT  When a cause has been identified, treatment for high blood pressure is directed at the  cause. There are a large number of medications to treat HTN. These fall into several categories, and your caregiver will help you select the medicines that are best for you. Medications may have side effects. You should review side effects with your caregiver. If your blood pressure stays high after you have made lifestyle changes or started on medicines,   Your medication(s) may need to be changed.  Other problems may need to be addressed.  Be certain you understand your prescriptions, and know how and when to take your medicine.  Be sure to follow up with your caregiver within the time frame advised (usually within two weeks) to have your blood pressure rechecked and to review your medications.  If you are taking more than one medicine to lower your blood pressure, make sure you know how and at what times they should be taken. Taking two medicines at the same time can result in blood pressure that is too low. SEEK IMMEDIATE MEDICAL CARE IF:  You develop a severe headache, blurred or changing vision, or confusion.  You have unusual weakness or numbness, or a faint feeling.  You have severe chest or abdominal pain, vomiting, or breathing problems. MAKE SURE YOU:   Understand these instructions.  Will watch your condition.  Will get help right away if you are not doing well or get worse. Document Released: 07/15/2005 Document Revised: 10/07/2011 Document Reviewed: 03/04/2008 [facility] Patient Information 2014 [employer], [place]. Chlamydia, Female Chlamydia is an infection caused by bacteria. It is spread through sexual contact. Chlamydia  can be in different areas of the body. These areas include the cervix, urethra, throat, or rectum. If you are infected, you must finish all treatments and follow up with a caregiver.  CAUSES  Chlamydia is a sexually transmitted disease. It is passed from an infected partner during intimate contact. This contact could be with the genitals, mouth, or rectal  area. Infections can also be passed from mothers to babies during birth. SYMPTOMS  There may not be any symptoms. This is often the case early in the infection. Symptoms you may notice include:  Mild pain and discomfort when urinating.  Inflammation of the rectum.  Vaginal discharge.  Painful intercourse.  Abdominal pain.  Bleeding between menstrual periods. DIAGNOSIS  To diagnose this infection, your caregiver will do a pelvic exam. Cultures will be taken of the vagina, cervix, urine, and possibly the rectum to see if the infection is chlamydia. TREATMENT You will be given antibiotic medicines. Any sexual partners should also be treated, even if they do not show symptoms. Take the medicine for the prescribed length of time. If you are pregnant, do not take tetracycline-type antibiotics. HOME CARE INSTRUCTIONS   Take your antibiotics as directed. Finish them even if you start to feel better.  Only take over-the-counter or prescription medicines for pain, discomfort, or fever as directed by your caregiver.  Inform any sexual partners about the infection. They should be treated also.  Do not have sexual contact until your caregiver tells you it is okay.  Get plenty of rest.  Eat a well-balanced diet, and drink enough fluids to keep your urine clear or pale yellow.  Keep all follow-up appointments and tests. SEEK IMMEDIATE MEDICAL CARE IF:   Your symptoms return.  You have a fever. MAKE SURE YOU:   Understand these instructions.  Will watch your condition.  Will get help right away if you are not doing well or get worse. Document Released: 04/24/2005 Document Revised: 10/07/2011 Document Reviewed: 03/02/2008 [facility] Patient Information 2014 [place], [place].

## 2013-07-29 NOTE — ED Provider Notes (Signed)
CSN: [id number]     Arrival date & time 07/29/13  0911 History   First MD Initiated Contact with Patient 07/29/13 0919     Chief Complaint  Patient presents with  . Exposure to STD   (Consider location/radiation/quality/duration/timing/severity/associated sxs/prior Treatment) Patient is a 43 y.o. female presenting with STD exposure.  Exposure to STD   Pt with two complaints. First she reports her husband was diagnosed with Chlamydia during and ED visit a few days ago. She has had unprotected sex with him since then but denies any symptoms. No vaginal bleeding, dysuria or pelvic pain.   She also reports she has been out of her lisinopril for about 2 weeks. She denies any specific HTN related symptoms but states her BP was still elevated even while taking her meds. She has new PCP appointment scheduled in about a week.   Past Medical History  Diagnosis Date  . Diabetes mellitus   . Hypertension    Past Surgical History  Procedure Laterality Date  . Cholecystectomy    . Tubal ligation    . Facial reconstruction surgery     History reviewed. No pertinent family history. History  Substance Use Topics  . Smoking status: Current Every Day Smoker -- 0.01 packs/day    Types: Cigarettes  . Smokeless tobacco: Never Used  . Alcohol Use: No   OB History   Grav Para Term Preterm Abortions TAB SAB Ect Mult Living                 Review of Systems All other systems reviewed and are negative except as noted in HPI.   Allergies  Hydrochlorothiazide; Morphine and related; Sulfa antibiotics; Eugenol; and Percocet  Home Medications   Current Outpatient Rx  Name  Route  Sig  Dispense  Refill  . insulin glargine (LANTUS) 100 UNIT/ML injection   Subcutaneous   Inject 25-75 Units into the skin 2 (two) times daily. 25 units in the am & 75 units as night         . insulin lispro (HUMALOG) 100 UNIT/ML injection   Subcutaneous   Inject 5-20 Units into the skin 3 (three) times daily before  meals. Takes 5-20 units as directed per sliding scale         . lisinopril (PRINIVIL,ZESTRIL) 30 MG tablet   Oral   Take 30 mg by mouth daily.          BP 213/109  Pulse 97  Temp(Src) 98.4 F (36.9 C) (Oral)  Resp 17  SpO2 97%  LMP 07/07/2013 Physical Exam  Nursing note and vitals reviewed. Constitutional: She is oriented to person, place, and time. She appears well-developed and well-nourished.  HENT:  Head: Normocephalic and atraumatic.  Eyes: EOM are normal. Pupils are equal, round, and reactive to light.  Neck: Normal range of motion. Neck supple.  Cardiovascular: Normal rate, normal heart sounds and intact distal pulses.   Pulmonary/Chest: Effort normal and breath sounds normal.  Abdominal: Bowel sounds are normal. She exhibits no distension. There is no tenderness.  Musculoskeletal: Normal range of motion. She exhibits no edema and no tenderness.  Neurological: She is alert and oriented to person, place, and time. She has normal strength. No cranial nerve deficit or sensory deficit.  Skin: Skin is warm and dry. No rash noted.  Psychiatric: She has a normal mood and affect.    ED Course  Procedures (including critical care time) Labs Review Labs Reviewed - No data to display Imaging Review No  results found.  EKG Interpretation   None       MDM   1. Exposure to STD   2. HTN (hypertension)     Pt offered screening for other STDs including gonorrhea, trichomonas, HIV, syphilis etc, but states she will defer that to her PCP. She just wants treatment for chlamydia today. Will also refill Lisinopril and add Norvasc 5mg  until she can followup with PCP.     Mariella Blackwelder B. Bernette Mayers, MD 07/29/13 [phone]

## 2013-08-26 ENCOUNTER — Inpatient Hospital Stay: Payer: Self-pay | Admitting: Internal Medicine

## 2013-09-01 ENCOUNTER — Emergency Department (INDEPENDENT_AMBULATORY_CARE_PROVIDER_SITE_OTHER)
Admission: EM | Admit: 2013-09-01 | Discharge: 2013-09-01 | Disposition: A | Discharge status: Nursing Home (Includes ICF) | Payer: Self-pay | Source: Home / Self Care | Attending: Emergency Medicine | Admitting: Emergency Medicine

## 2013-09-01 ENCOUNTER — Encounter (HOSPITAL_COMMUNITY): Payer: Self-pay | Admitting: Emergency Medicine

## 2013-09-01 ENCOUNTER — Emergency Department (HOSPITAL_COMMUNITY)
Admission: EM | Admit: 2013-09-01 | Discharge: 2013-09-01 | Disposition: A | Discharge status: Home / Self Care | Payer: Self-pay | Attending: Emergency Medicine | Admitting: Emergency Medicine

## 2013-09-01 ENCOUNTER — Emergency Department (INDEPENDENT_AMBULATORY_CARE_PROVIDER_SITE_OTHER): Payer: Self-pay

## 2013-09-01 DIAGNOSIS — F17200 Nicotine dependence, unspecified, uncomplicated: Secondary | ICD-10-CM | POA: Insufficient documentation

## 2013-09-01 DIAGNOSIS — Z79899 Other long term (current) drug therapy: Secondary | ICD-10-CM | POA: Insufficient documentation

## 2013-09-01 DIAGNOSIS — R197 Diarrhea, unspecified: Secondary | ICD-10-CM | POA: Insufficient documentation

## 2013-09-01 DIAGNOSIS — E119 Type 2 diabetes mellitus without complications: Secondary | ICD-10-CM | POA: Insufficient documentation

## 2013-09-01 DIAGNOSIS — Z794 Long term (current) use of insulin: Secondary | ICD-10-CM | POA: Insufficient documentation

## 2013-09-01 DIAGNOSIS — E1100 Type 2 diabetes mellitus with hyperosmolarity without nonketotic hyperglycemic-hyperosmolar coma (NKHHC): Secondary | ICD-10-CM

## 2013-09-01 DIAGNOSIS — J069 Acute upper respiratory infection, unspecified: Secondary | ICD-10-CM | POA: Insufficient documentation

## 2013-09-01 DIAGNOSIS — I10 Essential (primary) hypertension: Secondary | ICD-10-CM | POA: Insufficient documentation

## 2013-09-01 DIAGNOSIS — R1084 Generalized abdominal pain: Secondary | ICD-10-CM | POA: Insufficient documentation

## 2013-09-01 DIAGNOSIS — R739 Hyperglycemia, unspecified: Secondary | ICD-10-CM

## 2013-09-01 DIAGNOSIS — Z3202 Encounter for pregnancy test, result negative: Secondary | ICD-10-CM | POA: Insufficient documentation

## 2013-09-01 DIAGNOSIS — R112 Nausea with vomiting, unspecified: Secondary | ICD-10-CM | POA: Insufficient documentation

## 2013-09-01 DIAGNOSIS — R69 Illness, unspecified: Principal | ICD-10-CM

## 2013-09-01 DIAGNOSIS — J111 Influenza due to unidentified influenza virus with other respiratory manifestations: Secondary | ICD-10-CM

## 2013-09-01 DIAGNOSIS — E1101 Type 2 diabetes mellitus with hyperosmolarity with coma: Secondary | ICD-10-CM

## 2013-09-01 LAB — URINALYSIS, ROUTINE W REFLEX MICROSCOPIC
Bilirubin Urine: NEGATIVE
Glucose, UA: >1000 mg/dL — AB
HGB URINE DIPSTICK: NEGATIVE
KETONES UR: NEGATIVE mg/dL
LEUKOCYTES UA: NEGATIVE
Nitrite: NEGATIVE
PROTEIN: NEGATIVE mg/dL
Specific Gravity, Urine: 1.02 (ref 1.005–1.030)
Urobilinogen, UA: 0.2 mg/dL (ref 0.0–1.0)
pH: 6 (ref 5.0–8.0)

## 2013-09-01 LAB — POCT I-STAT, CHEM 8
BUN: 10 mg/dL (ref 6–23)
CALCIUM ION: 1.3 mmol/L — AB (ref 1.12–1.23)
CREATININE: 0.8 mg/dL (ref 0.50–1.10)
Chloride: 97 mEq/L (ref 96–112)
GLUCOSE: 462 mg/dL — AB (ref 70–99)
HCT: 46 % (ref 36.0–46.0)
Hemoglobin: 15.6 g/dL — ABNORMAL HIGH (ref 12.0–15.0)
POTASSIUM: 4 meq/L (ref 3.7–5.3)
Sodium: 133 mEq/L — ABNORMAL LOW (ref 137–147)
TCO2: 24 mmol/L (ref 0–100)

## 2013-09-01 LAB — COMPREHENSIVE METABOLIC PANEL
ALT: 14 U/L (ref 0–35)
AST: 17 U/L (ref 0–37)
Albumin: 3.4 g/dL — ABNORMAL LOW (ref 3.5–5.2)
Alkaline Phosphatase: 87 U/L (ref 39–117)
BILIRUBIN TOTAL: 0.4 mg/dL (ref 0.3–1.2)
BUN: 9 mg/dL (ref 6–23)
CHLORIDE: 98 meq/L (ref 96–112)
CO2: 27 meq/L (ref 19–32)
CREATININE: 0.6 mg/dL (ref 0.50–1.10)
Calcium: 8.9 mg/dL (ref 8.4–10.5)
GFR calc Af Amer: >90 mL/min (ref >90)
GFR calc non Af Amer: >90 mL/min (ref >90)
Glucose, Bld: 266 mg/dL — ABNORMAL HIGH (ref 70–99)
Potassium: 4.3 mEq/L (ref 3.7–5.3)
Sodium: 135 mEq/L — ABNORMAL LOW (ref 137–147)
Total Protein: 7.2 g/dL (ref 6.0–8.3)

## 2013-09-01 LAB — CBC
HEMATOCRIT: 40.4 % (ref 36.0–46.0)
Hemoglobin: 14.3 g/dL (ref 12.0–15.0)
MCH: 31.8 pg (ref 26.0–34.0)
MCHC: 35.4 g/dL (ref 30.0–36.0)
MCV: 89.8 fL (ref 78.0–100.0)
PLATELETS: 270 10*3/uL (ref 150–400)
RBC: 4.5 MIL/uL (ref 3.87–5.11)
RDW: 13.5 % (ref 11.5–15.5)
WBC: 7.9 10*3/uL (ref 4.0–10.5)

## 2013-09-01 LAB — GLUCOSE, CAPILLARY
Glucose-Capillary: 270 mg/dL — ABNORMAL HIGH (ref 70–99)
Glucose-Capillary: 209 mg/dL — ABNORMAL HIGH (ref 70–99)

## 2013-09-01 LAB — URINE MICROSCOPIC-ADD ON

## 2013-09-01 LAB — POCT PREGNANCY, URINE: Preg Test, Ur: NEGATIVE

## 2013-09-01 LAB — LIPASE, BLOOD: LIPASE: 34 U/L (ref 11–59)

## 2013-09-01 LAB — PREGNANCY, URINE: Preg Test, Ur: NEGATIVE

## 2013-09-01 MED ORDER — BENZONATATE 100 MG PO CAPS: 100.0000 mg | ORAL_CAPSULE | Freq: Three times a day (TID) | ORAL | Status: DC

## 2013-09-01 MED ORDER — INSULIN LISPRO 100 UNIT/ML ~~LOC~~ SOLN: 5.0000 [IU] | Freq: Three times a day (TID) | SUBCUTANEOUS | Status: DC

## 2013-09-01 MED ORDER — INSULIN GLARGINE 100 UNIT/ML ~~LOC~~ SOLN: 25.0000 [IU] | Freq: Every day | SUBCUTANEOUS | Status: DC

## 2013-09-01 MED ORDER — SODIUM CHLORIDE 0.9 % IV SOLN
INTRAVENOUS | Status: DC
Start: 2013-09-01 — End: 2013-09-01
  Administered 2013-09-01 (×2): via INTRAVENOUS

## 2013-09-01 MED ORDER — DM-GUAIFENESIN ER 30-600 MG PO TB12: 1.0000 | ORAL_TABLET | Freq: Two times a day (BID) | ORAL | Status: DC

## 2013-09-01 MED ORDER — ONDANSETRON HCL 4 MG/2ML IJ SOLN
4.0000 mg | Freq: Once | INTRAMUSCULAR | Status: AC
  Administered 2013-09-01: 4 mg via INTRAVENOUS
  Filled 2013-09-01: qty 2

## 2013-09-01 MED ORDER — KETOROLAC TROMETHAMINE 30 MG/ML IJ SOLN
30.0000 mg | Freq: Once | INTRAMUSCULAR | Status: AC
  Administered 2013-09-01: 30 mg via INTRAVENOUS
  Filled 2013-09-01: qty 1

## 2013-09-01 MED ORDER — ONDANSETRON 4 MG PO TBDP
ORAL_TABLET | ORAL | Status: AC
  Filled 2013-09-01: qty 2

## 2013-09-01 MED ORDER — SODIUM CHLORIDE 0.9 % IV BOLUS (SEPSIS)
1000.0000 mL | Freq: Once | INTRAVENOUS | Status: AC
  Administered 2013-09-01: 1000 mL via INTRAVENOUS

## 2013-09-01 MED ORDER — ONDANSETRON 4 MG PO TBDP
8.0000 mg | ORAL_TABLET | Freq: Once | ORAL | Status: AC
  Administered 2013-09-01: 8 mg via ORAL

## 2013-09-01 NOTE — ED Notes (Signed)
Lab called, Lipase added on to existing specimens

## 2013-09-01 NOTE — ED Provider Notes (Signed)
Chief Complaint   Chief Complaint  Patient presents with  . Emesis    History of Present Illness   [name] is a 43 year old diabetic female who has had a two-day history of diarrhea, nausea, vomiting, crampy, lower abdominal pain, and headache. She's had a temperature of up to 101, chills, myalgias, sore throat, nasal congestion, rhinorrhea, productive cough, and wheezing. She states her home blood sugars have been running between 600 and 700.  Review of Systems   Other than as noted above, the patient denies any of the following symptoms: Systemic:  No fevers, chills, sweats, or myalgias. Eye:  No redness or discharge. ENT:  No ear pain, headache, nasal congestion, drainage, sinus pressure, or sore throat. Neck:  No neck pain, stiffness, or swollen glands. Lungs:  No cough, sputum production, hemoptysis, wheezing, chest tightness, shortness of breath or chest pain. GI:  No abdominal pain, nausea, vomiting or diarrhea.  PMFSH   Past medical history, family history, social history, meds, and allergies were reviewed. She is allergic to morphine, hydrochlorothiazide, and sulfa. She takes Humalog by sliding scale, Lantus insulin 75 units at bedtime and 25 units in the morning, lisinopril, and amlodipine. She has type 2 diabetes and high blood pressure.  Physical exam   Vital signs:  BP 176/93  Pulse 110  Temp(Src) 98.4 F (36.9 C) (Oral)  Resp 18  SpO2 100%  LMP 08/29/2013 General:  Alert and oriented.  In no distress.  Skin warm and dry. Eye:  No conjunctival injection or drainage. Lids were normal. ENT:  TMs and canals were normal, without erythema or inflammation.  Nasal mucosa was clear and uncongested, without drainage.  Mucous membranes were moist.  Pharynx was clear with no exudate or drainage.  There were no oral ulcerations or lesions. Neck:  Supple, no adenopathy, tenderness or mass. Lungs:  No respiratory distress.  Lungs were clear to auscultation,  without wheezes, rales or rhonchi.  Breath sounds were clear and equal bilaterally.  Heart:  Regular rhythm, without gallops, murmers or rubs. Skin:  Clear, warm, and dry, without rash or lesions.  Labs   Results for orders placed during the hospital encounter of 09/01/13  POCT I-STAT, CHEM 8      Result Value Range   Sodium 133 (*) 137 - 147 mEq/L   Potassium 4.0  3.7 - 5.3 mEq/L   Chloride 97  96 - 112 mEq/L   BUN 10  6 - 23 mg/dL   Creatinine, Ser 1.610.80  0.50 - 1.10 mg/dL   Glucose, Bld 096462 (*) 70 - 99 mg/dL   Calcium, Ion 0.451.30 (*) 1.12 - 1.23 mmol/L   TCO2 24  0 - 100 mmol/L   Hemoglobin 15.6 (*) 12.0 - 15.0 g/dL   HCT 40.946.0  81.136.0 - 91.446.0 %     Radiology   Dg Chest 2 View  09/01/2013   CLINICAL DATA:  Fever  EXAM: CHEST  2 VIEW  COMPARISON:  09/24/2009  FINDINGS: Normal heart size. Clear lungs. No pneumothorax. No pleural effusion.  IMPRESSION: No active cardiopulmonary disease.   Electronically Signed   By: Maryclare BeanArt  Hoss M.D.   On: 09/01/2013 16:28    Course in [facility]gent [facility]   She was started on IV normal saline.  Assessment     The primary encounter diagnosis was Influenza-like illness. A diagnosis of Hyperosmolar non-ketotic state in patient with type 2 diabetes mellitus was also pertinent to this visit.  She is dehydrated and in need IV  rehydration and blood sugar control.  Plan    The patient was transferred to the ED via [facility] in stable condition.  Medical Decision Making     43 year old diabetic female with 2 day history of flu like illness:  Diarrhea, nausea, vomiting, crampy abdominal pain, headache, fever, chills, aches, sore throat, productive cough and wheezing.  States her home CBGs were very hight in 600 to 700 range.  Here her iStat 8 shows a glucose of 476 and Na of 133.  She will need IV fluids and glucose control.  Her CXR was normal.    Reuben Likes, MD 09/01/13 [phone]

## 2013-09-01 NOTE — Discharge Instructions (Signed)
We have determined that your problem requires further evaluation in the emergency department.  We will take care of your transport there.  Once at the emergency department, you will be evaluated by a provider and they will order whatever treatment or tests they deem necessary.  We cannot guarantee that they will do any specific test or do any specific treatment.  ° °

## 2013-09-01 NOTE — ED Notes (Signed)
Pt  Re[ports  Symptoms  Of  Body aches         Diarrhea          /.  Vomiting         With     Symptoms       X sev  Days  Pt  States  Her  Blood  Sugar  Was   High  At  Home today

## 2013-09-01 NOTE — ED Notes (Signed)
[facility] patient had [facility] for 2 days, afebrile but has had aches and chills, also c/o headahce.  [facility] now [facility], hx of diabetes and htn

## 2013-09-01 NOTE — Discharge Instructions (Signed)
Call for a follow up appointment with a Family or Primary Care Provider.  Return if Symptoms worsen.   Take medication as prescribed.  Continue to monitor your blood sugars. Drink plenty of fluids.

## 2013-09-01 NOTE — ED Provider Notes (Signed)
CSN: [id number]     Arrival date & time 09/01/13  1714 History   First MD Initiated Contact with Patient 09/01/13 1735     Chief Complaint  Patient presents with  . Hyperglycemia  . Cough  . Nausea   (Consider location/radiation/quality/duration/timing/severity/associated sxs/prior Treatment) HPI Comments: [name] is a 43 year old poorly controlled diabetic female who has had a two-day history of vomiting and diarrhea who was evaluated by Urgent Care today and was sent to the ED due to her elevated glucose.  She reports a productive cough, sore throat, nasal congestion for several days.  She also reports associated frontal headache. She reports her home glucose run high and she took 40 humalog today, 20 units prior to arival at the Urgent care, and 25 units of Lantus.   She also reports a fever of 101 yesterday, did not take antipyretics today. No PCP Patient's last menstrual period was 08/29/2013.   The history is provided by the patient and medical records. No language interpreter was used.    Past Medical History  Diagnosis Date  . Diabetes mellitus   . Hypertension    Past Surgical History  Procedure Laterality Date  . Cholecystectomy    . Tubal ligation    . Facial reconstruction surgery     No family history on file. History  Substance Use Topics  . Smoking status: Current Every Day Smoker -- 0.01 packs/day    Types: Cigarettes  . Smokeless tobacco: Never Used  . Alcohol Use: No   OB History   Grav Para Term Preterm Abortions TAB SAB Ect Mult Living                 Review of Systems  Constitutional: Positive for fever and chills.  HENT: Positive for congestion, rhinorrhea and sore throat.   Gastrointestinal: Positive for nausea, vomiting and diarrhea.  Endocrine: Positive for polydipsia and polyuria.  Genitourinary: Positive for frequency. Negative for dysuria.  Neurological: Positive for headaches.    Allergies  Eugenol; Hydrochlorothiazide;  Morphine and related; Percocet; and Sulfa antibiotics  Home Medications   Current Outpatient Rx  Name  Route  Sig  Dispense  Refill  . amLODipine (NORVASC) 5 MG tablet   Oral   Take 1 tablet (5 mg total) by mouth daily.   30 tablet   0   . insulin glargine (LANTUS) 100 UNIT/ML injection   Subcutaneous   Inject 25-75 Units into the skin 2 (two) times daily. 25 units in the am & 75 units as night         . insulin lispro (HUMALOG) 100 UNIT/ML injection   Subcutaneous   Inject 5-20 Units into the skin 3 (three) times daily before meals. Takes 5-20 units as directed per sliding scale         . lisinopril (PRINIVIL,ZESTRIL) 30 MG tablet   Oral   Take 1 tablet (30 mg total) by mouth daily.   30 tablet   0   . benzonatate (TESSALON) 100 MG capsule   Oral   Take 1 capsule (100 mg total) by mouth every 8 (eight) hours.   21 capsule   0   . dextromethorphan-guaiFENesin (MUCINEX DM) 30-600 MG per 12 hr tablet   Oral   Take 1 tablet by mouth 2 (two) times daily.   12 tablet   0   . insulin glargine (LANTUS) 100 UNIT/ML injection   Subcutaneous   Inject 0.25-0.75 mLs (25-75 Units total) into the skin at bedtime.  10 mL   11   . insulin lispro (HUMALOG) 100 UNIT/ML injection   Subcutaneous   Inject 5-20 Units into the skin 3 (three) times daily with meals. 5-20 units per sliding scale.   10 mL   11    BP 125/74  Pulse 73  Temp(Src) 98.9 F (37.2 C) (Oral)  Resp 20  SpO2 99%  LMP 08/29/2013 Physical Exam  Nursing note and vitals reviewed. Constitutional: She appears well-developed and well-nourished. No distress.  HENT:  Head: Normocephalic and atraumatic.  Nose: Rhinorrhea present. Right sinus exhibits frontal sinus tenderness. Left sinus exhibits frontal sinus tenderness.  Mouth/Throat: Mucous membranes are dry. Posterior oropharyngeal edema present. No oropharyngeal exudate or posterior oropharyngeal erythema.  Eyes: EOM are normal. Pupils are equal, round,  and reactive to light. No scleral icterus.  Neck: Neck supple.  Cardiovascular: Normal rate and regular rhythm.   Pulmonary/Chest: Effort normal and breath sounds normal. No respiratory distress. She has no wheezes. She has no rales.  Abdominal: Soft. There is generalized tenderness. There is no rebound, no guarding, no tenderness at McBurney's point and negative Murphy's sign.  Mild generalized tenderness to palpation.  Neurological: She is alert. She is not disoriented. No cranial nerve deficit or sensory deficit. She exhibits normal muscle tone. Coordination normal.  Psychiatric: She has a normal mood and affect. Her speech is normal and behavior is normal.    ED Course  Procedures (including critical care time) Labs Review Labs Reviewed  GLUCOSE, CAPILLARY - Abnormal; Notable for the following:    Glucose-Capillary 270 (*)    All other components within normal limits  COMPREHENSIVE METABOLIC PANEL - Abnormal; Notable for the following:    Sodium 135 (*)    Glucose, Bld 266 (*)    Albumin 3.4 (*)    All other components within normal limits  URINALYSIS, ROUTINE W REFLEX MICROSCOPIC - Abnormal; Notable for the following:    Glucose, UA >1000 (*)    All other components within normal limits  GLUCOSE, CAPILLARY - Abnormal; Notable for the following:    Glucose-Capillary 209 (*)    All other components within normal limits  CBC  PREGNANCY, URINE  LIPASE, BLOOD  URINE MICROSCOPIC-ADD ON  POCT PREGNANCY, URINE   Imaging Review Dg Chest 2 View  09/01/2013   CLINICAL DATA:  Fever  EXAM: CHEST  2 VIEW  COMPARISON:  09/24/2009  FINDINGS: Normal heart size. Clear lungs. No pneumothorax. No pleural effusion.  IMPRESSION: No active cardiopulmonary disease.   Electronically Signed   By: Maryclare BeanArt  Hoss M.D.   On: 09/01/2013 16:28    EKG Interpretation   None       MDM   1. Hyperglycemia   2. Diarrhea   3. Upper respiratory virus    Pt presents via UC due to blood glucose 478, was  given a bolus and CBG is 270 on arrival to ED.  Patient is in NAD. Chest XR performed at UC was without acute findings, Chem 8 was performed, will evaluate electrolytes after fluids and Liver due to vomiting. CBC shows no abnormality.  CMP shoes glucose 266, Na 135.  Likely viral sinus infection/upper respiratory symtoms. GI symptoms due to elevated glucose. Anion gap 10. Re-eval: Patient reports headache improving, requesting to be discharged home.  Was able to tolerate PO fluids and crackers in ED. CBG: 209 Discussed patient history, condition, and labs with Dr. Rubin PayorPickering who agrees the patient can be evaluated as an out-pt. Will refill insulin due to  the patient being low. Discussed lab results, imaging results, and treatment plan with the patient. Return precautions given. Reports understanding and no other concerns at this time.  Patient is stable for discharge at this time.  Meds given in ED:  Medications  sodium chloride 0.9 % bolus 1,000 mL (0 mLs Intravenous Stopped 09/01/13 1949)  ketorolac (TORADOL) 30 MG/ML injection 30 mg (30 mg Intravenous Given 09/01/13 1920)  ondansetron (ZOFRAN) injection 4 mg (4 mg Intravenous Given 09/01/13 1920)    Discharge Medication List as of 09/01/2013  9:36 PM    START taking these medications   Details  benzonatate (TESSALON) 100 MG capsule Take 1 capsule (100 mg total) by mouth every 8 (eight) hours., Starting 09/01/2013, Until Discontinued, Print    dextromethorphan-guaiFENesin (MUCINEX DM) 30-600 MG per 12 hr tablet Take 1 tablet by mouth 2 (two) times daily., Starting 09/01/2013, Until Discontinued, Print    !! insulin glargine (LANTUS) 100 UNIT/ML injection Inject 0.25-0.75 mLs (25-75 Units total) into the skin at bedtime., Starting 09/01/2013, Until Discontinued, Print    !! insulin lispro (HUMALOG) 100 UNIT/ML injection Inject 5-20 Units into the skin 3 (three) times daily with meals. 5-20 units per sliding scale., Starting 09/01/2013, Until Discontinued,  Print     !! - Potential duplicate medications found. Please discuss with provider.        Clabe Seal, PA-C 09/02/13 1447

## 2013-09-06 NOTE — ED Provider Notes (Signed)
Medical screening examination/treatment/procedure(s) were performed by non-physician practitioner and as supervising physician I was immediately available for consultation/collaboration.  EKG Interpretation   None        Juliet RudeNathan R. Rubin PayorPickering, MD 09/06/13 2039

## 2014-01-26 ENCOUNTER — Ambulatory Visit: Payer: Self-pay

## 2014-02-12 ENCOUNTER — Emergency Department (HOSPITAL_COMMUNITY): Payer: Self-pay

## 2014-02-12 ENCOUNTER — Emergency Department (HOSPITAL_COMMUNITY)
Admission: EM | Admit: 2014-02-12 | Discharge: 2014-02-12 | Disposition: A | Discharge status: Home / Self Care | Payer: Self-pay | Attending: Emergency Medicine | Admitting: Emergency Medicine

## 2014-02-12 ENCOUNTER — Encounter (HOSPITAL_COMMUNITY): Payer: Self-pay | Admitting: Emergency Medicine

## 2014-02-12 DIAGNOSIS — W108XXA Fall (on) (from) other stairs and steps, initial encounter: Secondary | ICD-10-CM | POA: Insufficient documentation

## 2014-02-12 DIAGNOSIS — Z7982 Long term (current) use of aspirin: Secondary | ICD-10-CM | POA: Insufficient documentation

## 2014-02-12 DIAGNOSIS — S9030XA Contusion of unspecified foot, initial encounter: Secondary | ICD-10-CM | POA: Insufficient documentation

## 2014-02-12 DIAGNOSIS — S90122A Contusion of left lesser toe(s) without damage to nail, initial encounter: Secondary | ICD-10-CM

## 2014-02-12 DIAGNOSIS — Y9301 Activity, walking, marching and hiking: Secondary | ICD-10-CM | POA: Insufficient documentation

## 2014-02-12 DIAGNOSIS — Z794 Long term (current) use of insulin: Secondary | ICD-10-CM | POA: Insufficient documentation

## 2014-02-12 DIAGNOSIS — Z79899 Other long term (current) drug therapy: Secondary | ICD-10-CM | POA: Insufficient documentation

## 2014-02-12 DIAGNOSIS — M25579 Pain in unspecified ankle and joints of unspecified foot: Secondary | ICD-10-CM | POA: Insufficient documentation

## 2014-02-12 DIAGNOSIS — I10 Essential (primary) hypertension: Secondary | ICD-10-CM | POA: Insufficient documentation

## 2014-02-12 DIAGNOSIS — S9032XA Contusion of left foot, initial encounter: Secondary | ICD-10-CM

## 2014-02-12 DIAGNOSIS — M25572 Pain in left ankle and joints of left foot: Secondary | ICD-10-CM

## 2014-02-12 DIAGNOSIS — Y9289 Other specified places as the place of occurrence of the external cause: Secondary | ICD-10-CM | POA: Insufficient documentation

## 2014-02-12 DIAGNOSIS — S90129A Contusion of unspecified lesser toe(s) without damage to nail, initial encounter: Secondary | ICD-10-CM | POA: Insufficient documentation

## 2014-02-12 DIAGNOSIS — F17200 Nicotine dependence, unspecified, uncomplicated: Secondary | ICD-10-CM | POA: Insufficient documentation

## 2014-02-12 DIAGNOSIS — E119 Type 2 diabetes mellitus without complications: Secondary | ICD-10-CM | POA: Insufficient documentation

## 2014-02-12 MED ORDER — TRAMADOL HCL 50 MG PO TABS
100.0000 mg | ORAL_TABLET | Freq: Once | ORAL | Status: AC
  Administered 2014-02-12: 100 mg via ORAL
  Filled 2014-02-12: qty 2

## 2014-02-12 MED ORDER — TRAMADOL HCL 50 MG PO TABS: 50.0000 mg | ORAL_TABLET | Freq: Four times a day (QID) | ORAL | Status: DC | PRN

## 2014-02-12 MED ORDER — NAPROXEN 500 MG PO TABS: 500.0000 mg | ORAL_TABLET | Freq: Two times a day (BID) | ORAL | Status: DC

## 2014-02-12 NOTE — ED Provider Notes (Signed)
Medical screening examination/treatment/procedure(s) were performed by non-physician practitioner and as supervising physician I was immediately available for consultation/collaboration.   EKG Interpretation None        Megan E Docherty, MD 02/12/14 0957 

## 2014-02-12 NOTE — ED Provider Notes (Signed)
CSN: [id number]     Arrival date & time 02/12/14  0630 History   First MD Initiated Contact with Patient 02/12/14 [phone]     Chief Complaint  Patient presents with  . Toe Injury     (Consider location/radiation/quality/duration/timing/severity/associated sxs/prior Treatment)  HPI Comments: Patient presents with complaint of left foot and ankle injury that began acutely at approximately 10:30 PM yesterday. Patient states she was walking downstairs and misstepped. She had immediate pain in her left great toe, left foot, and left ankle. She is unable to bear weight on her foot. She took extra strength Tylenol without relief. No numbness or tingling. Patient denies knee or hip pain. No treatments prior to arrival. No other injuries. Onset of symptoms acute. Course is constant. Walking, palpation, movement the pain worse. Nothing makes it better.  The history is provided by the patient.    Past Medical History  Diagnosis Date  . Diabetes mellitus   . Hypertension    Past Surgical History  Procedure Laterality Date  . Cholecystectomy    . Tubal ligation    . Facial reconstruction surgery     No family history on file. History  Substance Use Topics  . Smoking status: Current Every Day Smoker -- 0.01 packs/day    Types: Cigarettes  . Smokeless tobacco: Never Used  . Alcohol Use: No   OB History   Grav Para Term Preterm Abortions TAB SAB Ect Mult Living                 Review of Systems  Constitutional: Positive for activity change.  Musculoskeletal: Positive for arthralgias and gait problem. Negative for back pain, joint swelling and neck pain.  Skin: Negative for wound.  Neurological: Negative for weakness and numbness.    Allergies  Eugenol; Hydrochlorothiazide; Morphine and related; Percocet; and Sulfa antibiotics  Home Medications   Prior to Admission medications   Medication Sig Start Date End Date Taking? Authorizing Provider  amLODipine (NORVASC) 5 MG tablet Take 1  tablet (5 mg total) by mouth daily. 07/29/13   Charles B. Bernette Mayers, MD  benzonatate (TESSALON) 100 MG capsule Take 1 capsule (100 mg total) by mouth every 8 (eight) hours. 09/01/13   Lauren Doretha Imus, PA-C  dextromethorphan-guaiFENesin [facility] DM) 30-600 MG per 12 hr tablet Take 1 tablet by mouth 2 (two) times daily. 09/01/13   Lauren Doretha Imus, PA-C  insulin glargine (LANTUS) 100 UNIT/ML injection Inject 25-75 Units into the skin 2 (two) times daily. 25 units in the am & 75 units as night    Historical Provider, MD  insulin glargine (LANTUS) 100 UNIT/ML injection Inject 0.25-0.75 mLs (25-75 Units total) into the skin at bedtime. 09/01/13   Lauren Doretha Imus, PA-C  insulin lispro (HUMALOG) 100 UNIT/ML injection Inject 5-20 Units into the skin 3 (three) times daily before meals. Takes 5-20 units as directed per sliding scale    Historical Provider, MD  insulin lispro (HUMALOG) 100 UNIT/ML injection Inject 5-20 Units into the skin 3 (three) times daily with meals. 5-20 units per sliding scale. 09/01/13   Lauren Doretha Imus, PA-C  lisinopril (PRINIVIL,ZESTRIL) 30 MG tablet Take 1 tablet (30 mg total) by mouth daily. 07/29/13   Charles B. Bernette Mayers, MD   BP 175/91  Pulse 95  Temp(Src) 98.3 F (36.8 C) (Oral)  Ht 4\' 11"  (1.499 m)  Wt 183 lb (83.008 kg)  BMI 36.94 kg/m2  SpO2 99%  LMP 02/05/2014  Physical Exam  Nursing note and vitals reviewed. Constitutional:  She appears well-developed and well-nourished.  HENT:  Head: Normocephalic and atraumatic.  Eyes: Pupils are equal, round, and reactive to light.  Neck: Normal range of motion. Neck supple.  Cardiovascular: Exam reveals no decreased pulses.   Pulses:      Dorsalis pedis pulses are 2+ on the left side.       Posterior tibial pulses are 2+ on the left side.  Musculoskeletal: She exhibits tenderness. She exhibits no edema.       Left hip: Normal.       Left knee: Normal.       Left ankle: She exhibits decreased range of motion. She exhibits no swelling.  Tenderness. Lateral malleolus and medial malleolus tenderness found. No head of 5th metatarsal and no proximal fibula tenderness found. Achilles tendon normal.       Left foot: She exhibits decreased range of motion, tenderness and bony tenderness.       Feet:  Neurological: She is alert. No sensory deficit.  Motor, sensation, and vascular distal to the injury is fully intact.   Skin: Skin is warm and dry.  Psychiatric: She has a normal mood and affect.    ED Course  Procedures (including critical care time) Labs Review Labs Reviewed - No data to display  Imaging Review Dg Ankle Complete Left  02/12/2014   CLINICAL DATA:  Injury.  Left ankle pain.  EXAM: LEFT ANKLE COMPLETE - 3+ VIEW  COMPARISON:  None.  FINDINGS: No fracture. Ankle mortise is normally spaced and aligned. There is soft tissue swelling that predominates laterally. Small plantar calcaneal spur.  IMPRESSION: No fracture or dislocation.   Electronically Signed   By: Amie Portlandavid  Ormond M.D.   On: 02/12/2014 08:12   Dg Foot Complete Left  02/12/2014   CLINICAL DATA:  Left foot and ankle injury.  Pain.  EXAM: LEFT FOOT - COMPLETE 3+ VIEW  COMPARISON:  08/29/2009  FINDINGS: There is no evidence of fracture or dislocation. There is no evidence of arthropathy or other focal bone abnormality. Soft tissues are unremarkable.  IMPRESSION: Negative.   Electronically Signed   By: Amie Portlandavid  Ormond M.D.   On: 02/12/2014 08:11     EKG Interpretation None      7:20 AM Patient seen and examined. Work-up initiated. Medications ordered.   Vital signs reviewed and are as follows: Filed Vitals:   02/12/14 0643  BP: 175/91  Pulse: 95  Temp: 98.3 F (36.8 C)   8:22 AM X-rays are negative. Pt informed. Will give crutches and ACE wrap.   Patient was counseled on RICE protocol and told to rest injury, use ice for no longer than 15 minutes every hour, compress the area, and elevate above the level of their heart as much as possible to reduce  swelling. Questions answered. Patient verbalized understanding.    Patient counseled on use of narcotic pain medications. Counseled not to combine these medications with others containing tylenol. Urged not to drink alcohol, drive, or perform any other activities that requires focus while taking these medications. The patient verbalizes understanding and agrees with the plan.   MDM   Final diagnoses:  Contusion of foot including toes, left, initial encounter  Ankle pain, left   Patient with foot contusion and possible ankle sprain. X-rays are negative. Avoid NSAIDs due to hypertension. Will give crutches and Ace wrap for conservative management. Orthopedic followup not improved. Lower extremity is neurovascularly intact.    Renne CriglerJoshua Providence Stivers, PA-C 02/12/14 [phone]

## 2014-02-12 NOTE — Discharge Instructions (Signed)
Please read and follow all provided instructions.  Your diagnoses today include:  1. Contusion of foot including toes, left, initial encounter   2. Ankle pain, left     Tests performed today include:  An x-ray of the affected area - does NOT show any broken bones  Vital signs. See below for your results today.   Medications prescribed:   Tramadol - narcotic-like pain medication  DO NOT drive or perform any activities that require you to be awake and alert because this medicine can make you drowsy.   Take any prescribed medications only as directed.  Home care instructions:   Follow any educational materials contained in this packet  Follow R.I.C.E. Protocol:  R - rest your injury   I  - use ice on injury without applying directly to skin  C - compress injury with bandage or splint  E - elevate the injury as much as possible  Follow-up instructions: Please follow-up with your primary care provider or the provided orthopedic physician (bone specialist) if you continue to have significant pain or trouble walking in 1 week. In this case you may have a severe injury that requires further care.   Return instructions:   Please return if your toes are numb or tingling, appear gray or blue, or you have severe pain (also elevate leg and loosen splint or wrap if you were given one)  Please return to the Emergency Department if you experience worsening symptoms.   Please return if you have any other emergent concerns.  Additional Information:  Your vital signs today were: BP 175/91   Pulse 95   Temp(Src) 98.3 F (36.8 C) (Oral)   Ht 4\' 11"  (1.499 m)   Wt 183 lb (83.008 kg)   BMI 36.94 kg/m2   SpO2 99%   LMP 02/05/2014 If your blood pressure (BP) was elevated above 135/85 this visit, please have this repeated by your doctor within one month. -------------- If prescribed crutches for your injury: use crutches with non-weight bearing for the first few days. Then, you may walk as  the pain allows, or as instructed. Start gradually with weight bearing on the affected side. Once you can walk pain free, then try jogging. When you can run forwards, then you can try moving side-to-side. If you cannot walk without crutches in one week, you need a re-check. --------------

## 2014-02-12 NOTE — ED Notes (Signed)
Pt. Stated, I was walking and missed a step and my toe felt like it went back.

## 2014-05-28 ENCOUNTER — Encounter (HOSPITAL_COMMUNITY): Payer: Self-pay | Admitting: Emergency Medicine

## 2014-05-28 ENCOUNTER — Emergency Department (HOSPITAL_COMMUNITY): Payer: Self-pay

## 2014-05-28 ENCOUNTER — Emergency Department (HOSPITAL_COMMUNITY)
Admission: EM | Admit: 2014-05-28 | Discharge: 2014-05-28 | Disposition: A | Discharge status: Home / Self Care | Payer: Self-pay | Attending: Emergency Medicine | Admitting: Emergency Medicine

## 2014-05-28 DIAGNOSIS — M6289 Other specified disorders of muscle: Secondary | ICD-10-CM

## 2014-05-28 DIAGNOSIS — Z9889 Other specified postprocedural states: Secondary | ICD-10-CM | POA: Insufficient documentation

## 2014-05-28 DIAGNOSIS — Z720 Tobacco use: Secondary | ICD-10-CM | POA: Insufficient documentation

## 2014-05-28 DIAGNOSIS — M952 Other acquired deformity of head: Secondary | ICD-10-CM | POA: Insufficient documentation

## 2014-05-28 DIAGNOSIS — I10 Essential (primary) hypertension: Secondary | ICD-10-CM | POA: Insufficient documentation

## 2014-05-28 DIAGNOSIS — Z794 Long term (current) use of insulin: Secondary | ICD-10-CM | POA: Insufficient documentation

## 2014-05-28 DIAGNOSIS — E119 Type 2 diabetes mellitus without complications: Secondary | ICD-10-CM | POA: Insufficient documentation

## 2014-05-28 DIAGNOSIS — M6281 Muscle weakness (generalized): Secondary | ICD-10-CM | POA: Insufficient documentation

## 2014-05-28 DIAGNOSIS — Z3202 Encounter for pregnancy test, result negative: Secondary | ICD-10-CM | POA: Insufficient documentation

## 2014-05-28 DIAGNOSIS — R531 Weakness: Secondary | ICD-10-CM

## 2014-05-28 DIAGNOSIS — G43409 Hemiplegic migraine, not intractable, without status migrainosus: Secondary | ICD-10-CM | POA: Insufficient documentation

## 2014-05-28 LAB — RAPID URINE DRUG SCREEN, HOSP PERFORMED
Amphetamines: NOT DETECTED
BARBITURATES: NOT DETECTED
BENZODIAZEPINES: NOT DETECTED
COCAINE: NOT DETECTED
Opiates: NOT DETECTED
Tetrahydrocannabinol: NOT DETECTED

## 2014-05-28 LAB — COMPREHENSIVE METABOLIC PANEL
ALBUMIN: 3.2 g/dL — AB (ref 3.5–5.2)
ALT: 10 U/L (ref 0–35)
AST: 12 U/L (ref 0–37)
Alkaline Phosphatase: 87 U/L (ref 39–117)
Anion gap: 13 (ref 5–15)
BUN: 9 mg/dL (ref 6–23)
CALCIUM: 8.5 mg/dL (ref 8.4–10.5)
CO2: 23 mEq/L (ref 19–32)
Chloride: 97 mEq/L (ref 96–112)
Creatinine, Ser: 0.51 mg/dL (ref 0.50–1.10)
GFR calc non Af Amer: >90 mL/min (ref >90)
Glucose, Bld: 387 mg/dL — ABNORMAL HIGH (ref 70–99)
Potassium: 3.5 mEq/L — ABNORMAL LOW (ref 3.7–5.3)
Sodium: 133 mEq/L — ABNORMAL LOW (ref 137–147)
TOTAL PROTEIN: 6.8 g/dL (ref 6.0–8.3)
Total Bilirubin: 0.5 mg/dL (ref 0.3–1.2)

## 2014-05-28 LAB — URINALYSIS, ROUTINE W REFLEX MICROSCOPIC
BILIRUBIN URINE: NEGATIVE
Glucose, UA: >1000 mg/dL — AB
Hgb urine dipstick: NEGATIVE
KETONES UR: NEGATIVE mg/dL
Leukocytes, UA: NEGATIVE
NITRITE: NEGATIVE
PH: 6.5 (ref 5.0–8.0)
Protein, ur: NEGATIVE mg/dL
Specific Gravity, Urine: 1.031 — ABNORMAL HIGH (ref 1.005–1.030)
Urobilinogen, UA: 0.2 mg/dL (ref 0.0–1.0)

## 2014-05-28 LAB — DIFFERENTIAL
BASOS ABS: 0.1 10*3/uL (ref 0.0–0.1)
BASOS PCT: 1 % (ref 0–1)
EOS PCT: 2 % (ref 0–5)
Eosinophils Absolute: 0.1 10*3/uL (ref 0.0–0.7)
Lymphocytes Relative: 43 % (ref 12–46)
Lymphs Abs: 3 10*3/uL (ref 0.7–4.0)
Monocytes Absolute: 0.3 10*3/uL (ref 0.1–1.0)
Monocytes Relative: 5 % (ref 3–12)
Neutro Abs: 3.6 10*3/uL (ref 1.7–7.7)
Neutrophils Relative %: 49 % (ref 43–77)

## 2014-05-28 LAB — CBG MONITORING, ED: Glucose-Capillary: 303 mg/dL — ABNORMAL HIGH (ref 70–99)

## 2014-05-28 LAB — I-STAT TROPONIN, ED: Troponin i, poc: 0.01 ng/mL (ref 0.00–0.08)

## 2014-05-28 LAB — CBC
HCT: 38.2 % (ref 36.0–46.0)
Hemoglobin: 13.7 g/dL (ref 12.0–15.0)
MCH: 31.3 pg (ref 26.0–34.0)
MCHC: 35.9 g/dL (ref 30.0–36.0)
MCV: 87.2 fL (ref 78.0–100.0)
PLATELETS: 301 10*3/uL (ref 150–400)
RBC: 4.38 MIL/uL (ref 3.87–5.11)
RDW: 12.6 % (ref 11.5–15.5)
WBC: 7.2 10*3/uL (ref 4.0–10.5)

## 2014-05-28 LAB — POC URINE PREG, ED: Preg Test, Ur: NEGATIVE

## 2014-05-28 LAB — PROTIME-INR
INR: 1.22 (ref 0.00–1.49)
Prothrombin Time: 15.5 seconds — ABNORMAL HIGH (ref 11.6–15.2)

## 2014-05-28 LAB — URINE MICROSCOPIC-ADD ON

## 2014-05-28 LAB — ETHANOL

## 2014-05-28 LAB — APTT: aPTT: 28 seconds (ref 24–37)

## 2014-05-28 MED ORDER — DIPHENHYDRAMINE HCL 50 MG/ML IJ SOLN
25.0000 mg | Freq: Once | INTRAMUSCULAR | Status: AC
  Administered 2014-05-28: 25 mg via INTRAVENOUS
  Filled 2014-05-28: qty 1

## 2014-05-28 MED ORDER — SODIUM CHLORIDE 0.9 % IV BOLUS (SEPSIS)
500.0000 mL | Freq: Once | INTRAVENOUS | Status: AC
  Administered 2014-05-28: 500 mL via INTRAVENOUS

## 2014-05-28 MED ORDER — LISINOPRIL 20 MG PO TABS
30.0000 mg | ORAL_TABLET | Freq: Once | ORAL | Status: AC
  Administered 2014-05-28: 30 mg via ORAL
  Filled 2014-05-28 (×2): qty 1

## 2014-05-28 MED ORDER — METOCLOPRAMIDE HCL 5 MG/ML IJ SOLN
10.0000 mg | Freq: Once | INTRAMUSCULAR | Status: AC
  Administered 2014-05-28: 10 mg via INTRAVENOUS
  Filled 2014-05-28: qty 2

## 2014-05-28 MED ORDER — ACETAMINOPHEN 500 MG PO TABS
1000.0000 mg | ORAL_TABLET | Freq: Once | ORAL | Status: AC
  Administered 2014-05-28: 1000 mg via ORAL
  Filled 2014-05-28: qty 2

## 2014-05-28 MED ORDER — LISINOPRIL 30 MG PO TABS: 30.0000 mg | ORAL_TABLET | Freq: Every day | ORAL | Status: DC

## 2014-05-28 MED ORDER — KETOROLAC TROMETHAMINE 30 MG/ML IJ SOLN
30.0000 mg | Freq: Once | INTRAMUSCULAR | Status: AC
  Administered 2014-05-28: 30 mg via INTRAVENOUS
  Filled 2014-05-28: qty 1

## 2014-05-28 NOTE — ED Notes (Signed)
Pt presents to department for evaluation of headache and L sided numbness. Onset of symptoms last night @ 8:30pm. Upon arrival L sided facial droop noted. Pt also states entire L side of body feels numb. Also reports blurred vision and nausea/vomiting. Pt is alert and oriented x4.

## 2014-05-28 NOTE — Discharge Instructions (Signed)

## 2014-05-28 NOTE — ED Notes (Signed)
Pt given gingerale and crackers 

## 2014-05-28 NOTE — ED Provider Notes (Signed)
I saw and evaluated the patient, reviewed the resident's note and I agree with the findings and plan.   EKG Interpretation None     I agree with the dictated note the patient presented reporting that she had a facial droop that she noticed upon awakening from sleep. She also noted some headache in association with this. There were no associated fevers chills or infectious symptoms.  Patient's initial physical examination did have complete cranial nerve VII involvement but in addition to this some 3 out of 5 motor weakness on the left.  Patient's case was consult with neurology. MRI did not reveal any acute abnormalities. At this point time findings most consistent with complex migraine. Treatment was initiated for this which resulted in good symptom resolution. The patient has hypertension which has been untreated due to medication noncompliance. Patient's lisinopril will be refilled and she is instructed on the necessity of primary care physician follow-up.  Arby BarretteMarcy Domenica Weightman, MD 05/28/14 [phone]

## 2014-05-28 NOTE — Consult Note (Signed)
Consult Reason for Consult:left facial droop and weakness Referring Physician: Dr Donnald GarrePfeiffer [facility]  CC: headache and left sided weakness  HPI: [name] is an 43 y.o. female presenting for evaluation of acute onset left facial droop, left sided weakness and headache. Has associated headache, N/V and left sided paresthesias. Last known normal was 8:30pm on 10/30, awoke at 5am and noted facial droop. Has history of migraines in the past but nothing like this.   SBP in the ED has been elevated at 186-207. Patient notes this is her "typical BP". Does not see a PCP on a regular basis, has been prescribed BP meds in the past but not currently taking any.   MRI brain and MRA head completed and were unremarkable.   Past Medical History  Diagnosis Date  . Diabetes mellitus   . Hypertension     Past Surgical History  Procedure Laterality Date  . Cholecystectomy    . Tubal ligation    . Facial reconstruction surgery      No family history on file.  Social History:  reports that she has been smoking Cigarettes.  She has been smoking about 0.01 packs per day. She has never used smokeless tobacco. She reports that she does not drink alcohol or use illicit drugs.  Allergies  Allergen Reactions  . Eugenol Hives and Other (See Comments)    Burns mouth  . Hydrochlorothiazide Anaphylaxis  . Morphine And Related Anaphylaxis  . Percocet [Oxycodone-Acetaminophen] Anaphylaxis and Nausea And Vomiting  . Shellfish Allergy Anaphylaxis  . Sulfa Antibiotics Anaphylaxis  . Tomato Anaphylaxis    Medications: Prior to Admission:  (Not in a hospital admission)  ROS: Out of a complete 14 system review, the patient complains of only the following symptoms, and all other reviewed systems are negative.  Physical Examination: Blood pressure 186/110, pulse 82, temperature 98.8 F (37.1 C), temperature source Oral, resp. rate 20, height 4\' 11"  (1.499 m), weight 80.74 kg (178 lb), last menstrual  period 05/19/2014, SpO2 100.00%.  Neurologic Examination Mental Status: Alert, oriented, thought content appropriate.  Speech fluent without evidence of aphasia.  Able to follow 3 step commands without difficulty. Cranial Nerves: II: funduscopic exam wnl bilaterally, visual fields grossly normal, pupils equal, round, reactive to light and accommodation III,IV, VI: ptosis not present, extra-ocular motions intact bilaterally V,VII: smile symmetric,lower left facial weakness with smiling VIII: hearing normal bilaterally XI: trapezius strength/neck flexion strength normal bilaterally XII: tongue strength normal  Motor: Right : Upper extremity    Left:     Upper extremity 5/5 deltoid       4+ deltoid 5/5 biceps      5-5 biceps  5/5 triceps      5-5 triceps 5/5 hand grip      5-/5 hand grip  Lower extremity     Lower extremity 5/5 hip flexor      5-/5 hip flexor 5/5 quadricep      5-/5 quadriceps  5/5 hamstrings     5-/5 hamstrings 5/5 plantar flexion       5-/5 plantar flexion 5/5 plantar extension     5-/5 plantar extension Tone and bulk:normal tone throughout; no atrophy noted Sensory:subjective paresthesias of left side but LT intact throughout Deep Tendon Reflexes: 1+ and symmetric throughout Cerebellar: normal finger-to-nose, Gait: defer due to multiple leads  Laboratory Studies:   Basic Metabolic Panel:  Recent Labs Lab 05/28/14 0900  NA 133*  K 3.5*  CL 97  CO2 23  GLUCOSE 387*  BUN 9  CREATININE 0.51  CALCIUM 8.5    Liver Function Tests:  Recent Labs Lab 05/28/14 0900  AST 12  ALT 10  ALKPHOS 87  BILITOT 0.5  PROT 6.8  ALBUMIN 3.2*   No results found for this basename: LIPASE, AMYLASE,  in the last 168 hours No results found for this basename: AMMONIA,  in the last 168 hours  CBC:  Recent Labs Lab 05/28/14 0900  WBC 7.2  NEUTROABS 3.6  HGB 13.7  HCT 38.2  MCV 87.2  PLT 301    Cardiac Enzymes: No results found for this basename: CKTOTAL,  CKMB, CKMBINDEX, TROPONINI,  in the last 168 hours  BNP: No components found with this basename: POCBNP,   CBG:  Recent Labs Lab 05/28/14 0956  GLUCAP 303*    Microbiology: Results for orders placed during the hospital encounter of 12/24/07  WET PREP, GENITAL     Status: Abnormal   Collection Time    12/24/07 11:11 PM      Result Value Ref Range Status   Yeast Wet Prep HPF POC NONE SEEN   Final   Trich, Wet Prep NONE SEEN   Final   Clue Cells Wet Prep HPF POC FEW (*)  Final   WBC, Wet Prep HPF POC FEW (*)  Final    Coagulation Studies:  Recent Labs  05/28/14 0900  LABPROT 15.5*  INR 1.22    Urinalysis:  Recent Labs Lab 05/28/14 0951  COLORURINE GREEN*  LABSPEC 1.031*  PHURINE 6.5  GLUCOSEU >1000*  HGBUR NEGATIVE  BILIRUBINUR NEGATIVE  KETONESUR NEGATIVE  PROTEINUR NEGATIVE  UROBILINOGEN 0.2  NITRITE NEGATIVE  LEUKOCYTESUR NEGATIVE    Lipid Panel:  No results found for this basename: chol, trig, hdl, cholhdl, vldl, ldlcalc    HgbA1C:  No results found for this basename: HGBA1C    Urine Drug Screen:     Component Value Date/Time   LABOPIA NONE DETECTED 05/28/2014 0951   COCAINSCRNUR NONE DETECTED 05/28/2014 0951   LABBENZ NONE DETECTED 05/28/2014 0951   AMPHETMU NONE DETECTED 05/28/2014 0951   THCU NONE DETECTED 05/28/2014 0951   LABBARB NONE DETECTED 05/28/2014 0951    Alcohol Level:  Recent Labs Lab 05/28/14 0900  ETH <11    Imaging: Ct Head Wo Contrast  05/28/2014   CLINICAL DATA:  Left body numbness some blurry vision. Left facial weakness and droop. Stroke.  EXAM: CT HEAD WITHOUT CONTRAST  TECHNIQUE: Contiguous axial images were obtained from the base of the skull through the vertex without intravenous contrast.  COMPARISON:  09/29/2012  FINDINGS: No mass effect, midline shift, or acute hemorrhage. Ventricular system and extra-axial space are unremarkable. Brain parenchyma is stable. There is subtle patchy low density in the frontal  lobe white matter. Mastoid air cells clear. No skull fracture.  IMPRESSION: No acute intracranial pathology.  Patchy white matter disease in the frontal lobes. This can be seen with hypertension and chronic ischemic changes. Correlate clinically.   Electronically Signed   By: Maryclare BeanArt  Hoss M.D.   On: 05/28/2014 10:02   Mr Maxine GlennMra Head Wo Contrast  05/28/2014   CLINICAL DATA:  Left-sided weakness.  EXAM: MRI HEAD WITHOUT CONTRAST  MRA HEAD WITHOUT CONTRAST  TECHNIQUE: Multiplanar, multiecho pulse sequences of the brain and surrounding structures were obtained without intravenous contrast. Angiographic images of the head were obtained using MRA technique without contrast.  COMPARISON:  CT head 05/28/2014  FINDINGS: MRI HEAD FINDINGS  Negative for acute infarct.  Multiple small hyperintensities  in the frontal and parietal white matter bilaterally. No cortical infarct. Brainstem and cerebellum are normal.  Cerebral volume normal.  Ventricle size normal.  Negative for hemorrhage or fluid collection. Negative for mass or edema.  Pituitary normal in size.  Paranasal sinuses are clear.  MRA HEAD FINDINGS  Both vertebral arteries are widely patent to the basilar. Basilar widely patent. AICA, superior cerebellar, and posterior cerebral arteries are patent bilaterally. Tiny left PICA.  Internal carotid artery widely patent bilaterally. Anterior and middle cerebral arteries are widely patent bilaterally.  Negative for cerebral aneurysm.  IMPRESSION: Negative for acute infarct.  Scattered small white matter hyperintensities bilaterally which may be related to chronic microvascular ischemia. Correlate with risk factors. Vasculitis and migraine headaches could also give this appearance  Negative MRA head.   Electronically Signed   By: Marlan Palau M.D.   On: 05/28/2014 11:59   Mr Laqueta Jean ZO Contrast  05/28/2014   CLINICAL DATA:  Left-sided weakness.  EXAM: MRI HEAD WITHOUT CONTRAST  MRA HEAD WITHOUT CONTRAST  TECHNIQUE:  Multiplanar, multiecho pulse sequences of the brain and surrounding structures were obtained without intravenous contrast. Angiographic images of the head were obtained using MRA technique without contrast.  COMPARISON:  CT head 05/28/2014  FINDINGS: MRI HEAD FINDINGS  Negative for acute infarct.  Multiple small hyperintensities in the frontal and parietal white matter bilaterally. No cortical infarct. Brainstem and cerebellum are normal.  Cerebral volume normal.  Ventricle size normal.  Negative for hemorrhage or fluid collection. Negative for mass or edema.  Pituitary normal in size.  Paranasal sinuses are clear.  MRA HEAD FINDINGS  Both vertebral arteries are widely patent to the basilar. Basilar widely patent. AICA, superior cerebellar, and posterior cerebral arteries are patent bilaterally. Tiny left PICA.  Internal carotid artery widely patent bilaterally. Anterior and middle cerebral arteries are widely patent bilaterally.  Negative for cerebral aneurysm.  IMPRESSION: Negative for acute infarct.  Scattered small white matter hyperintensities bilaterally which may be related to chronic microvascular ischemia. Correlate with risk factors. Vasculitis and migraine headaches could also give this appearance  Negative MRA head.   Electronically Signed   By: Marlan Palau M.D.   On: 05/28/2014 11:59   Dg Chest Portable 1 View  05/28/2014   CLINICAL DATA:  Left-sided chest pain  EXAM: PORTABLE CHEST - 1 VIEW  COMPARISON:  None.  FINDINGS: The heart size and mediastinal contours are within normal limits. Both lungs are clear. The visualized skeletal structures are unremarkable.  IMPRESSION: No active disease.   Electronically Signed   By: Maryclare Bean M.D.   On: 05/28/2014 10:29     Assessment/Plan:  43y/o with prior history of HTN and migraine headaches presenting for acute onset left facial droop and left sided weakness. Associated symptoms of headache, nausea, photophobia and marked HTN (though notes this is  her baseline). MRI brain and MRA head unremarkable. Symptoms are most consistent with a complex migraine headache. Differential would also include a MRI negative infarct though history more consistent with migraine.   -agree with IV reglan, toradol and benadryl -if no improvement can consider IV depacon 500mg  x 2 doses and/or IV magnesium sulfate 1000mg  x 1 -will need outpatient neurology and primary care follow up  Elspeth Cho, DO [facility] [phone]  If 7pm- 7am, please page neurology on call as listed in AMION. 05/28/2014, 12:32 PM

## 2014-05-28 NOTE — ED Provider Notes (Signed)
CSN: [id number]     Arrival date & time 05/28/14  [id number] History   First MD Initiated Contact with Patient 05/28/14 0848     Chief Complaint  Patient presents with  . Numbness  . Headache    Patient is a 43 y.o. female presenting with Acute Neurological Problem. The history is provided by the patient.  Cerebrovascular Accident This is a new problem. The current episode started yesterday (last normal at 8:30pm; woke up at 5am and noticed facial droop). The problem occurs constantly. The problem has been unchanged. Associated symptoms include headaches, nausea, numbness, vomiting and weakness. Pertinent negatives include no abdominal pain, coughing, fever or rash. Nothing aggravates the symptoms. She has tried nothing for the symptoms. Improvement on treatment: n/a.    Past Medical History  Diagnosis Date  . Diabetes mellitus   . Hypertension    Past Surgical History  Procedure Laterality Date  . Cholecystectomy    . Tubal ligation    . Facial reconstruction surgery     No family history on file. History  Substance Use Topics  . Smoking status: Current Every Day Smoker -- 0.01 packs/day    Types: Cigarettes  . Smokeless tobacco: Never Used  . Alcohol Use: No   OB History   Grav Para Term Preterm Abortions TAB SAB Ect Mult Living                 Review of Systems  Constitutional: Negative for fever.  Respiratory: Negative for cough.   Gastrointestinal: Positive for nausea and vomiting. Negative for abdominal pain.  Skin: Negative for rash.  Neurological: Positive for facial asymmetry, weakness, numbness and headaches.  All other systems reviewed and are negative.   Allergies  Eugenol; Hydrochlorothiazide; Morphine and related; Percocet; Shellfish allergy; Sulfa antibiotics; and Tomato  Home Medications   Prior to Admission medications   Medication Sig Start Date End Date Taking? Authorizing Provider  insulin glargine (LANTUS) 100 UNIT/ML injection Inject 25-75 Units  into the skin 2 (two) times daily. 25 units in the am & 75 units as night    Historical Provider, MD  insulin lispro (HUMALOG) 100 UNIT/ML injection Inject 5-20 Units into the skin 3 (three) times daily before meals. Takes 5-20 units as directed per sliding scale    Historical Provider, MD   BP 207/109  Pulse 107  Temp(Src) 98.8 F (37.1 C) (Oral)  Resp 18  Ht 4\' 11"  (1.499 m)  Wt 178 lb (80.74 kg)  BMI 35.93 kg/m2  SpO2 99%  Physical Exam  Constitutional: She is oriented to person, place, and time. She appears well-developed and well-nourished. She is cooperative. No distress.  HENT:  Head: Normocephalic and atraumatic.  Right Ear: External ear normal.  Left Ear: External ear normal.  Eyes: Left eye exhibits abnormal extraocular motion (will not track past midline laterally).  Neck: Normal range of motion and phonation normal.  Cardiovascular: Normal rate and regular rhythm.   Pulmonary/Chest: Effort normal and breath sounds normal. No respiratory distress. She has no wheezes. She has no rales.  Abdominal: Soft. She exhibits no distension. There is no tenderness. There is no rebound and no guarding.  Neurological: She is alert and oriented to person, place, and time.  Left face upper and lower droop; left sided pronator drift; left sided strength 3/5 in upper and lower extremity; Right 5/5; sensation subjectively diminished in left upper and lower extremity; moving all extremities spontaneously  Skin: Skin is warm and dry. No rash noted. She  is not diaphoretic.    ED Course  Procedures   Labs Review  Results for orders placed during the hospital encounter of 05/28/14  ETHANOL      Result Value Ref Range   Alcohol, Ethyl (B) <11  0 - 11 mg/dL  PROTIME-INR      Result Value Ref Range   Prothrombin Time 15.5 (*) 11.6 - 15.2 seconds   INR 1.22  0.00 - 1.49  APTT      Result Value Ref Range   aPTT 28  24 - 37 seconds  CBC      Result Value Ref Range   WBC 7.2  4.0 - 10.5  K/uL   RBC 4.38  3.87 - 5.11 MIL/uL   Hemoglobin 13.7  12.0 - 15.0 g/dL   HCT 16.1  09.6 - 04.5 %   MCV 87.2  78.0 - 100.0 fL   MCH 31.3  26.0 - 34.0 pg   MCHC 35.9  30.0 - 36.0 g/dL   RDW 40.9  81.1 - 91.4 %   Platelets 301  150 - 400 K/uL  DIFFERENTIAL      Result Value Ref Range   Neutrophils Relative % 49  43 - 77 %   Neutro Abs 3.6  1.7 - 7.7 K/uL   Lymphocytes Relative 43  12 - 46 %   Lymphs Abs 3.0  0.7 - 4.0 K/uL   Monocytes Relative 5  3 - 12 %   Monocytes Absolute 0.3  0.1 - 1.0 K/uL   Eosinophils Relative 2  0 - 5 %   Eosinophils Absolute 0.1  0.0 - 0.7 K/uL   Basophils Relative 1  0 - 1 %   Basophils Absolute 0.1  0.0 - 0.1 K/uL  COMPREHENSIVE METABOLIC PANEL      Result Value Ref Range   Sodium 133 (*) 137 - 147 mEq/L   Potassium 3.5 (*) 3.7 - 5.3 mEq/L   Chloride 97  96 - 112 mEq/L   CO2 23  19 - 32 mEq/L   Glucose, Bld 387 (*) 70 - 99 mg/dL   BUN 9  6 - 23 mg/dL   Creatinine, Ser 7.82  0.50 - 1.10 mg/dL   Calcium 8.5  8.4 - 95.6 mg/dL   Total Protein 6.8  6.0 - 8.3 g/dL   Albumin 3.2 (*) 3.5 - 5.2 g/dL   AST 12  0 - 37 U/L   ALT 10  0 - 35 U/L   Alkaline Phosphatase 87  39 - 117 U/L   Total Bilirubin 0.5  0.3 - 1.2 mg/dL   GFR calc non Af Amer >90  >90 mL/min   GFR calc Af Amer >90  >90 mL/min   Anion gap 13  5 - 15  URINE RAPID DRUG SCREEN (HOSP PERFORMED)      Result Value Ref Range   Opiates NONE DETECTED  NONE DETECTED   Cocaine NONE DETECTED  NONE DETECTED   Benzodiazepines NONE DETECTED  NONE DETECTED   Amphetamines NONE DETECTED  NONE DETECTED   Tetrahydrocannabinol NONE DETECTED  NONE DETECTED   Barbiturates NONE DETECTED  NONE DETECTED  URINALYSIS, ROUTINE W REFLEX MICROSCOPIC      Result Value Ref Range   Color, Urine GREEN (*) YELLOW   APPearance CLEAR  CLEAR   Specific Gravity, Urine 1.031 (*) 1.005 - 1.030   pH 6.5  5.0 - 8.0   Glucose, UA >1000 (*) NEGATIVE mg/dL   Hgb urine dipstick NEGATIVE  NEGATIVE   Bilirubin Urine NEGATIVE   NEGATIVE   Ketones, ur NEGATIVE  NEGATIVE mg/dL   Protein, ur NEGATIVE  NEGATIVE mg/dL   Urobilinogen, UA 0.2  0.0 - 1.0 mg/dL   Nitrite NEGATIVE  NEGATIVE   Leukocytes, UA NEGATIVE  NEGATIVE  URINE MICROSCOPIC-ADD ON      Result Value Ref Range   Squamous Epithelial / LPF FEW (*) RARE  I-STAT TROPOININ, ED      Result Value Ref Range   Troponin i, poc 0.01  0.00 - 0.08 ng/mL   Comment 3           CBG MONITORING, ED      Result Value Ref Range   Glucose-Capillary 303 (*) 70 - 99 mg/dL   Comment 1 Notify RN     Comment 2 Documented in Chart    POC URINE PREG, ED      Result Value Ref Range   Preg Test, Ur NEGATIVE  NEGATIVE     Imaging Review Ct Head Wo Contrast  05/28/2014   CLINICAL DATA:  Left body numbness some blurry vision. Left facial weakness and droop. Stroke.  EXAM: CT HEAD WITHOUT CONTRAST  TECHNIQUE: Contiguous axial images were obtained from the base of the skull through the vertex without intravenous contrast.  COMPARISON:  09/29/2012  FINDINGS: No mass effect, midline shift, or acute hemorrhage. Ventricular system and extra-axial space are unremarkable. Brain parenchyma is stable. There is subtle patchy low density in the frontal lobe white matter. Mastoid air cells clear. No skull fracture.  IMPRESSION: No acute intracranial pathology.  Patchy white matter disease in the frontal lobes. This can be seen with hypertension and chronic ischemic changes. Correlate clinically.   Electronically Signed   By: Maryclare Bean M.D.   On: 05/28/2014 10:02   Mr [name] Head Wo Contrast  05/28/2014   CLINICAL DATA:  Left-sided weakness.  EXAM: MRI HEAD WITHOUT CONTRAST  MRA HEAD WITHOUT CONTRAST  TECHNIQUE: Multiplanar, multiecho pulse sequences of the brain and surrounding structures were obtained without intravenous contrast. Angiographic images of the head were obtained using MRA technique without contrast.  COMPARISON:  CT head 05/28/2014  FINDINGS: MRI HEAD FINDINGS  Negative for acute  infarct.  Multiple small hyperintensities in the frontal and parietal white matter bilaterally. No cortical infarct. Brainstem and cerebellum are normal.  Cerebral volume normal.  Ventricle size normal.  Negative for hemorrhage or fluid collection. Negative for mass or edema.  Pituitary normal in size.  Paranasal sinuses are clear.  MRA HEAD FINDINGS  Both vertebral arteries are widely patent to the basilar. Basilar widely patent. AICA, superior cerebellar, and posterior cerebral arteries are patent bilaterally. Tiny left PICA.  Internal carotid artery widely patent bilaterally. Anterior and middle cerebral arteries are widely patent bilaterally.  Negative for cerebral aneurysm.  IMPRESSION: Negative for acute infarct.  Scattered small white matter hyperintensities bilaterally which may be related to chronic microvascular ischemia. Correlate with risk factors. Vasculitis and migraine headaches could also give this appearance  Negative MRA head.   Electronically Signed   By: Marlan Palau M.D.   On: 05/28/2014 11:59   Mr Laqueta Jean ZO Contrast  05/28/2014   CLINICAL DATA:  Left-sided weakness.  EXAM: MRI HEAD WITHOUT CONTRAST  MRA HEAD WITHOUT CONTRAST  TECHNIQUE: Multiplanar, multiecho pulse sequences of the brain and surrounding structures were obtained without intravenous contrast. Angiographic images of the head were obtained using MRA technique without contrast.  COMPARISON:  CT head 05/28/2014  FINDINGS: MRI HEAD FINDINGS  Negative for acute infarct.  Multiple small hyperintensities in the frontal and parietal white matter bilaterally. No cortical infarct. Brainstem and cerebellum are normal.  Cerebral volume normal.  Ventricle size normal.  Negative for hemorrhage or fluid collection. Negative for mass or edema.  Pituitary normal in size.  Paranasal sinuses are clear.  MRA HEAD FINDINGS  Both vertebral arteries are widely patent to the basilar. Basilar widely patent. AICA, superior cerebellar, and posterior  cerebral arteries are patent bilaterally. Tiny left PICA.  Internal carotid artery widely patent bilaterally. Anterior and middle cerebral arteries are widely patent bilaterally.  Negative for cerebral aneurysm.  IMPRESSION: Negative for acute infarct.  Scattered small white matter hyperintensities bilaterally which may be related to chronic microvascular ischemia. Correlate with risk factors. Vasculitis and migraine headaches could also give this appearance  Negative MRA head.   Electronically Signed   By: Marlan Palauharles  Clark M.D.   On: 05/28/2014 11:59   Dg Chest Portable 1 View  05/28/2014   CLINICAL DATA:  Left-sided chest pain  EXAM: PORTABLE CHEST - 1 VIEW  COMPARISON:  None.  FINDINGS: The heart size and mediastinal contours are within normal limits. Both lungs are clear. The visualized skeletal structures are unremarkable.  IMPRESSION: No active disease.   Electronically Signed   By: Maryclare BeanArt  Hoss M.D.   On: 05/28/2014 10:29   EKG with NSR; Rate 77; No ST elevation or depression  MDM   Final diagnoses:  Left-sided weakness    43 y.o. female with a past medical history of DM, HTN, tobacco use (every day smoker). Presents to the ED due to headache and left sided facial droop. Also noted to have left upper and lower extremity weakness on exam. Per patient and female visitor at bedside, she was last "normal" at 8:30pm last night. She woke up at 5am with these symptoms. Because of the timeline (over 12 hours since last normal), Code Stroke was not called, however we are concerned she may have had a CVA vs hypertensive emergency vs complex migraine.   10:05 AM CT head with no acute process; will obtain MRI/MRA of the brain  12:39 PM Patient still with left sided facial droop of the upper and lower face, although improved from arrival; left upper extremity 5/5 strength; left lower extremity 4/5 with dorsiflexion and plantar flexion; neurology consulted; patient still hypertensive with headache; migraine  cocktail ordered  MRI/MRA negative. Per neurology, likely a complex migraine - I agree with this assessment. Will control her pain and reassess.   2:38 PM  Patient reports her headache is "better" after migraine cocktail, appears more comfortable; BP elevated; takes Lisinopril 30mg  PO at home (has been out of her medication); will give her a dose here  Her BP improved. Headache resolved. On repeat neuro exam still has upper and lower facial weakness with smiling and raising her eyebrows, improved from arrival. Ambulates with a steady gait, normal strength in bilateral upper and lower extremities  Strict return precautions discussed and given in writing. Rx for home lisinopril given since she has been out. Resources given to follow up with PCP. She was discharged home.   This case managed in conjunction with my attending, Dr. Donnald GarrePfeiffer.   Lindsey GlennAnn Lamark Schue-Marie Kluge, MD 05/28/14 [phone]

## 2014-05-28 NOTE — ED Notes (Signed)
Pt comfortable with discharge and follow up instructions. Prescriptions x1 

## 2014-05-28 NOTE — ED Notes (Signed)
Neurologist at bedside. 

## 2014-11-04 ENCOUNTER — Other Ambulatory Visit: Payer: Self-pay | Admitting: Family Medicine

## 2014-11-04 DIAGNOSIS — Z1231 Encounter for screening mammogram for malignant neoplasm of breast: Secondary | ICD-10-CM

## 2014-11-16 ENCOUNTER — Ambulatory Visit: Payer: Self-pay

## 2016-02-02 ENCOUNTER — Encounter (HOSPITAL_COMMUNITY): Payer: Self-pay | Admitting: Family Medicine

## 2016-02-02 ENCOUNTER — Ambulatory Visit (HOSPITAL_COMMUNITY)
Admission: EM | Admit: 2016-02-02 | Discharge: 2016-02-02 | Disposition: A | Discharge status: Home / Self Care | Payer: BLUE CROSS/BLUE SHIELD | Attending: Family Medicine | Admitting: Family Medicine

## 2016-02-02 DIAGNOSIS — I10 Essential (primary) hypertension: Secondary | ICD-10-CM | POA: Diagnosis not present

## 2016-02-02 DIAGNOSIS — R112 Nausea with vomiting, unspecified: Secondary | ICD-10-CM

## 2016-02-02 DIAGNOSIS — R739 Hyperglycemia, unspecified: Secondary | ICD-10-CM | POA: Diagnosis not present

## 2016-02-02 LAB — POCT I-STAT, CHEM 8
BUN: 12 mg/dL (ref 6–20)
CREATININE: 0.5 mg/dL (ref 0.44–1.00)
Calcium, Ion: 1.28 mmol/L (ref 1.13–1.30)
Chloride: 102 mmol/L (ref 101–111)
Glucose, Bld: 404 mg/dL — ABNORMAL HIGH (ref 65–99)
HEMATOCRIT: 46 % (ref 36.0–46.0)
Hemoglobin: 15.6 g/dL — ABNORMAL HIGH (ref 12.0–15.0)
Potassium: 4.2 mmol/L (ref 3.5–5.1)
Sodium: 136 mmol/L (ref 135–145)
TCO2: 24 mmol/L (ref 0–100)

## 2016-02-02 MED ORDER — ONDANSETRON 8 MG PO TBDP: 4.0000 mg | ORAL_TABLET | Freq: Once | ORAL | Status: DC

## 2016-02-02 MED ORDER — ONDANSETRON 4 MG PO TBDP
8.0000 mg | ORAL_TABLET | Freq: Once | ORAL | Status: AC
  Administered 2016-02-02: 8 mg via ORAL

## 2016-02-02 MED ORDER — ONDANSETRON 4 MG PO TBDP
ORAL_TABLET | ORAL | Status: AC
  Filled 2016-02-02: qty 2

## 2016-02-02 NOTE — ED Provider Notes (Signed)
CSN: [id number]     Arrival date & time 02/02/16  1310 History   First MD Initiated Contact with Patient 02/02/16 1502     Chief Complaint  Patient presents with  . Nausea  . Dizziness   (Consider location/radiation/quality/duration/timing/severity/associated sxs/prior Treatment) HPI  History of general malaise, headache, nausea, mild diarrhea. Patient took a BCs at the beginning of symptom onset which made symptoms worse per patient. She states that he checked her blood sugar and it was 360. 10 units of Humalog with only mild improvement in symptoms. Patient states she has poor sugar control due to the cost of her Lantus. States that it is common for her blood glucose to run in the 400-600 range. Denies any dysuria, frequency, fevers, chest pain, shortness of breath, palpitations, LOC.    Past Medical History  Diagnosis Date  . Diabetes mellitus   . Hypertension    Past Surgical History  Procedure Laterality Date  . Cholecystectomy    . Tubal ligation    . Facial reconstruction surgery     Family History  Problem Relation Age of Onset  . Hypertension Mother   . Diabetes Mother   . Hypertension Father   . Diabetes Father    Social History  Substance Use Topics  . Smoking status: Current Every Day Smoker -- 0.01 packs/day    Types: Cigarettes  . Smokeless tobacco: Never Used  . Alcohol Use: No   OB History    No data available     Review of Systems Per HPI with all other pertinent systems negative.   Allergies  Eugenol; Hydrochlorothiazide; Morphine and related; Percocet; Shellfish allergy; Sulfa antibiotics; and Tomato  Home Medications   Prior to Admission medications   Medication Sig Start Date End Date Taking? Authorizing Provider  insulin glargine (LANTUS) 100 UNIT/ML injection Inject 25-75 Units into the skin 2 (two) times daily. 25 units in the am & 75 units as night   Yes Historical Provider, MD  insulin lispro (HUMALOG) 100 UNIT/ML injection Inject 5-20  Units into the skin 3 (three) times daily before meals. Takes 5-20 units as directed per sliding scale   Yes Historical Provider, MD  lisinopril (PRINIVIL,ZESTRIL) 30 MG tablet Take 1 tablet (30 mg total) by mouth daily. 05/28/14  Yes [name], MD  MetFORMIN HCl (GLUCOPHAGE PO) Take by mouth.    Historical Provider, MD  ondansetron (ZOFRAN-ODT) 8 MG disintegrating tablet Take 0.5 tablets (4 mg total) by mouth once. 02/02/16   Ozella Rocksavid J Matty Vanroekel, MD  triamcinolone (NASACORT ALLERGY 24HR) 55 MCG/ACT AERO nasal inhaler Place 1 spray into both nostrils every other day.    Historical Provider, MD   Meds Ordered and Administered this Visit   Medications  ondansetron (ZOFRAN-ODT) disintegrating tablet 8 mg (8 mg Oral Given 02/02/16 1534)    BP 192/106 mmHg  Pulse 96  Temp(Src) 98.4 F (36.9 C) (Oral)  Resp 16  SpO2 99%  LMP 01/29/2016 No data found.   Physical Exam Physical Exam  Constitutional: oriented to person, place, and time. appears well-developed and well-nourished. No distress.  HENT:  Head: Normocephalic and atraumatic.  Eyes: EOMI. PERRL.  Neck: Normal range of motion.  Cardiovascular: RRR, no m/r/g, 2+ distal pulses,  Pulmonary/Chest: Effort normal and breath sounds normal. No respiratory distress.  Abdominal: Soft. Bowel sounds are normal. NonTTP, no distension.  Musculoskeletal: Normal range of motion. Non ttp, no effusion.  Neurological: alert and oriented to person, place, and time.  Skin: Skin is warm. No  rash noted. non diaphoretic.  Psychiatric: normal mood and affect. behavior is normal. Judgment and thought content normal.   ED Course  Procedures (including critical care time)  Labs Review Labs Reviewed  POCT I-STAT, CHEM 8 - Abnormal; Notable for the following:    Glucose, Bld 404 (*)    Hemoglobin 15.6 (*)    All other components within normal limits    Imaging Review No results found.   Visual Acuity Review  Right Eye Distance:   Left Eye Distance:    Bilateral Distance:    Right Eye Near:   Left Eye Near:    Bilateral Near:         MDM   1. Hyperglycemia   2. Non-intractable vomiting with nausea, vomiting of unspecified type   3. Essential hypertension    Difficult to say whether patient's current episode is from a viral gastroenteritis infection or onset of diabetic gastroparesis. Basic chemistries drawn showing hyperglycemia without metabolic acidosis. Given the fact the patient typically is in this range of hyperglycemia we will not treat at this time. Patient to treat when she gets home. Patient with emergency room if her symptoms get worse. Counseled patient significantly on the importance of tighter hyperglycemic control. Zofran 8 mg given in clinic. Patient go home and take blood pressure medications.  Ozella Rocksavid J Shriley Joffe, MD 02/02/16 [phone]

## 2016-02-02 NOTE — Discharge Instructions (Signed)
The cause of your symptoms is likely from a combination of high sugars causing gastroparesis as well as a possible viral got infection called gastroenteritis. Please drink plenty of fluids, increase your insulin dosing to keep her sugars below 200, and uses Zofran for additional relief. If you're unable to get relief where she does get worse please go to the emergency room.

## 2016-02-02 NOTE — ED Notes (Signed)
Pt c/o feeling light headed, nauseas and weak onset yest Reports her sugar level this am was 367 Has not had Glucophage x2 months... Also does not take lisinopril regularly ... BP today = 192/106 A&O x4... NAD... Family at bedside.

## 2016-08-07 ENCOUNTER — Other Ambulatory Visit: Payer: Self-pay | Admitting: *Deleted

## 2016-08-07 ENCOUNTER — Emergency Department (HOSPITAL_COMMUNITY): Payer: BLUE CROSS/BLUE SHIELD

## 2016-08-07 ENCOUNTER — Inpatient Hospital Stay (HOSPITAL_COMMUNITY)
Admission: EM | Admit: 2016-08-07 | Discharge: 2016-08-19 | DRG: 233 | Disposition: A | Discharge status: Rehabilitation Facility | Payer: BLUE CROSS/BLUE SHIELD | Attending: Cardiothoracic Surgery | Admitting: Cardiothoracic Surgery

## 2016-08-07 ENCOUNTER — Encounter (HOSPITAL_COMMUNITY)
Admission: EM | Disposition: A | Discharge status: Rehabilitation Facility | Payer: Self-pay | Source: Home / Self Care | Attending: Cardiothoracic Surgery

## 2016-08-07 ENCOUNTER — Encounter (HOSPITAL_COMMUNITY): Payer: Self-pay | Admitting: Emergency Medicine

## 2016-08-07 DIAGNOSIS — I10 Essential (primary) hypertension: Secondary | ICD-10-CM | POA: Diagnosis present

## 2016-08-07 DIAGNOSIS — Z01810 Encounter for preprocedural cardiovascular examination: Secondary | ICD-10-CM | POA: Diagnosis not present

## 2016-08-07 DIAGNOSIS — R414 Neurologic neglect syndrome: Secondary | ICD-10-CM

## 2016-08-07 DIAGNOSIS — E1159 Type 2 diabetes mellitus with other circulatory complications: Secondary | ICD-10-CM | POA: Diagnosis not present

## 2016-08-07 DIAGNOSIS — E1151 Type 2 diabetes mellitus with diabetic peripheral angiopathy without gangrene: Secondary | ICD-10-CM | POA: Diagnosis present

## 2016-08-07 DIAGNOSIS — Z6834 Body mass index (BMI) 34.0-34.9, adult: Secondary | ICD-10-CM

## 2016-08-07 DIAGNOSIS — I25110 Atherosclerotic heart disease of native coronary artery with unstable angina pectoris: Secondary | ICD-10-CM | POA: Diagnosis present

## 2016-08-07 DIAGNOSIS — Z79899 Other long term (current) drug therapy: Secondary | ICD-10-CM

## 2016-08-07 DIAGNOSIS — R29810 Facial weakness: Secondary | ICD-10-CM | POA: Diagnosis not present

## 2016-08-07 DIAGNOSIS — R079 Chest pain, unspecified: Secondary | ICD-10-CM | POA: Diagnosis present

## 2016-08-07 DIAGNOSIS — D62 Acute posthemorrhagic anemia: Secondary | ICD-10-CM | POA: Diagnosis not present

## 2016-08-07 DIAGNOSIS — Z951 Presence of aortocoronary bypass graft: Secondary | ICD-10-CM

## 2016-08-07 DIAGNOSIS — J9811 Atelectasis: Secondary | ICD-10-CM | POA: Diagnosis not present

## 2016-08-07 DIAGNOSIS — E669 Obesity, unspecified: Secondary | ICD-10-CM

## 2016-08-07 DIAGNOSIS — Z9114 Patient's other noncompliance with medication regimen: Secondary | ICD-10-CM | POA: Diagnosis not present

## 2016-08-07 DIAGNOSIS — E785 Hyperlipidemia, unspecified: Secondary | ICD-10-CM | POA: Diagnosis present

## 2016-08-07 DIAGNOSIS — E1165 Type 2 diabetes mellitus with hyperglycemia: Secondary | ICD-10-CM | POA: Diagnosis present

## 2016-08-07 DIAGNOSIS — I69351 Hemiplegia and hemiparesis following cerebral infarction affecting right dominant side: Secondary | ICD-10-CM | POA: Diagnosis not present

## 2016-08-07 DIAGNOSIS — G8191 Hemiplegia, unspecified affecting right dominant side: Secondary | ICD-10-CM | POA: Diagnosis present

## 2016-08-07 DIAGNOSIS — D72829 Elevated white blood cell count, unspecified: Secondary | ICD-10-CM | POA: Diagnosis present

## 2016-08-07 DIAGNOSIS — I639 Cerebral infarction, unspecified: Secondary | ICD-10-CM | POA: Diagnosis not present

## 2016-08-07 DIAGNOSIS — I2510 Atherosclerotic heart disease of native coronary artery without angina pectoris: Secondary | ICD-10-CM | POA: Diagnosis not present

## 2016-08-07 DIAGNOSIS — E1169 Type 2 diabetes mellitus with other specified complication: Secondary | ICD-10-CM | POA: Diagnosis not present

## 2016-08-07 DIAGNOSIS — I25810 Atherosclerosis of coronary artery bypass graft(s) without angina pectoris: Secondary | ICD-10-CM | POA: Diagnosis not present

## 2016-08-07 DIAGNOSIS — F17210 Nicotine dependence, cigarettes, uncomplicated: Secondary | ICD-10-CM | POA: Diagnosis present

## 2016-08-07 DIAGNOSIS — I69398 Other sequelae of cerebral infarction: Secondary | ICD-10-CM | POA: Diagnosis not present

## 2016-08-07 DIAGNOSIS — I214 Non-ST elevation (NSTEMI) myocardial infarction: Secondary | ICD-10-CM | POA: Diagnosis present

## 2016-08-07 DIAGNOSIS — E46 Unspecified protein-calorie malnutrition: Secondary | ICD-10-CM | POA: Diagnosis present

## 2016-08-07 DIAGNOSIS — Z794 Long term (current) use of insulin: Secondary | ICD-10-CM

## 2016-08-07 DIAGNOSIS — E118 Type 2 diabetes mellitus with unspecified complications: Secondary | ICD-10-CM

## 2016-08-07 DIAGNOSIS — R0789 Other chest pain: Secondary | ICD-10-CM | POA: Diagnosis not present

## 2016-08-07 DIAGNOSIS — L8990 Pressure ulcer of unspecified site, unspecified stage: Secondary | ICD-10-CM | POA: Insufficient documentation

## 2016-08-07 DIAGNOSIS — G43909 Migraine, unspecified, not intractable, without status migrainosus: Secondary | ICD-10-CM | POA: Diagnosis present

## 2016-08-07 DIAGNOSIS — R0682 Tachypnea, not elsewhere classified: Secondary | ICD-10-CM

## 2016-08-07 DIAGNOSIS — E8770 Fluid overload, unspecified: Secondary | ICD-10-CM | POA: Diagnosis not present

## 2016-08-07 DIAGNOSIS — J9589 Other postprocedural complications and disorders of respiratory system, not elsewhere classified: Secondary | ICD-10-CM

## 2016-08-07 DIAGNOSIS — J189 Pneumonia, unspecified organism: Secondary | ICD-10-CM | POA: Diagnosis not present

## 2016-08-07 DIAGNOSIS — I249 Acute ischemic heart disease, unspecified: Secondary | ICD-10-CM | POA: Diagnosis not present

## 2016-08-07 DIAGNOSIS — I6340 Cerebral infarction due to embolism of unspecified cerebral artery: Secondary | ICD-10-CM | POA: Diagnosis not present

## 2016-08-07 DIAGNOSIS — IMO0001 Reserved for inherently not codable concepts without codable children: Secondary | ICD-10-CM | POA: Diagnosis present

## 2016-08-07 DIAGNOSIS — R262 Difficulty in walking, not elsewhere classified: Secondary | ICD-10-CM

## 2016-08-07 DIAGNOSIS — Z9689 Presence of other specified functional implants: Secondary | ICD-10-CM

## 2016-08-07 DIAGNOSIS — R269 Unspecified abnormalities of gait and mobility: Secondary | ICD-10-CM | POA: Diagnosis not present

## 2016-08-07 DIAGNOSIS — I6312 Cerebral infarction due to embolism of basilar artery: Secondary | ICD-10-CM | POA: Diagnosis not present

## 2016-08-07 HISTORY — DX: Headache: R51

## 2016-08-07 HISTORY — DX: Pneumonia, unspecified organism: J18.9

## 2016-08-07 HISTORY — PX: CARDIAC CATHETERIZATION: SHX172

## 2016-08-07 HISTORY — DX: Type 2 diabetes mellitus without complications: E11.9

## 2016-08-07 HISTORY — DX: Personal history of urinary calculi: Z87.442

## 2016-08-07 HISTORY — DX: Non-ST elevation (NSTEMI) myocardial infarction: I21.4

## 2016-08-07 HISTORY — DX: Headache, unspecified: R51.9

## 2016-08-07 HISTORY — DX: Unspecified injury of unspecified part of pancreas, initial encounter: S36.209A

## 2016-08-07 LAB — BASIC METABOLIC PANEL
ANION GAP: 7 (ref 5–15)
BUN: 9 mg/dL (ref 6–20)
CALCIUM: 8.9 mg/dL (ref 8.9–10.3)
CO2: 21 mmol/L — ABNORMAL LOW (ref 22–32)
Chloride: 109 mmol/L (ref 101–111)
Creatinine, Ser: 0.52 mg/dL (ref 0.44–1.00)
GFR calc Af Amer: >60 mL/min (ref >60)
Glucose, Bld: 107 mg/dL — ABNORMAL HIGH (ref 65–99)
Potassium: 3.1 mmol/L — ABNORMAL LOW (ref 3.5–5.1)
SODIUM: 137 mmol/L (ref 135–145)

## 2016-08-07 LAB — CBC
HEMATOCRIT: 38.2 % (ref 36.0–46.0)
HEMOGLOBIN: 13.5 g/dL (ref 12.0–15.0)
MCH: 30.3 pg (ref 26.0–34.0)
MCHC: 35.3 g/dL (ref 30.0–36.0)
MCV: 85.7 fL (ref 78.0–100.0)
Platelets: 269 10*3/uL (ref 150–400)
RBC: 4.46 MIL/uL (ref 3.87–5.11)
RDW: 13.5 % (ref 11.5–15.5)
WBC: 10.8 10*3/uL — ABNORMAL HIGH (ref 4.0–10.5)

## 2016-08-07 LAB — GLUCOSE, CAPILLARY
GLUCOSE-CAPILLARY: 127 mg/dL — AB (ref 65–99)
Glucose-Capillary: 264 mg/dL — ABNORMAL HIGH (ref 65–99)
Glucose-Capillary: 398 mg/dL — ABNORMAL HIGH (ref 65–99)

## 2016-08-07 LAB — HEPATIC FUNCTION PANEL
ALBUMIN: 3.2 g/dL — AB (ref 3.5–5.0)
ALT: 12 U/L — ABNORMAL LOW (ref 14–54)
AST: 16 U/L (ref 15–41)
Alkaline Phosphatase: 86 U/L (ref 38–126)
BILIRUBIN TOTAL: 0.7 mg/dL (ref 0.3–1.2)
Bilirubin, Direct: <0.1 mg/dL — ABNORMAL LOW (ref 0.1–0.5)
Total Protein: 6.9 g/dL (ref 6.5–8.1)

## 2016-08-07 LAB — APTT: APTT: 26 s (ref 24–36)

## 2016-08-07 LAB — I-STAT TROPONIN, ED
Troponin i, poc: 0.03 ng/mL (ref 0.00–0.08)
Troponin i, poc: 0.08 ng/mL (ref 0.00–0.08)

## 2016-08-07 LAB — TROPONIN I
TROPONIN I: 0.1 ng/mL — AB (ref <0.03)
TROPONIN I: 0.06 ng/mL — AB (ref <0.03)
Troponin I: 0.06 ng/mL (ref <0.03)

## 2016-08-07 LAB — PROTIME-INR
INR: 1.16
PROTHROMBIN TIME: 14.8 s (ref 11.4–15.2)

## 2016-08-07 LAB — HEPARIN LEVEL (UNFRACTIONATED): HEPARIN UNFRACTIONATED: 1.18 [IU]/mL — AB (ref 0.30–0.70)

## 2016-08-07 LAB — POCT ACTIVATED CLOTTING TIME: ACTIVATED CLOTTING TIME: 268 s

## 2016-08-07 LAB — CBG MONITORING, ED: Glucose-Capillary: 102 mg/dL — ABNORMAL HIGH (ref 65–99)

## 2016-08-07 LAB — LIPASE, BLOOD: Lipase: 26 U/L (ref 11–51)

## 2016-08-07 SURGERY — LEFT HEART CATH AND CORONARY ANGIOGRAPHY

## 2016-08-07 MED ORDER — MIDAZOLAM HCL 2 MG/2ML IJ SOLN
INTRAMUSCULAR | Status: AC
  Filled 2016-08-07: qty 2

## 2016-08-07 MED ORDER — ONDANSETRON HCL 4 MG/2ML IJ SOLN
4.0000 mg | Freq: Four times a day (QID) | INTRAMUSCULAR | Status: DC | PRN
  Administered 2016-08-08: 4 mg via INTRAVENOUS
  Filled 2016-08-07 (×2): qty 2

## 2016-08-07 MED ORDER — FENTANYL CITRATE (PF) 100 MCG/2ML IJ SOLN
INTRAMUSCULAR | Status: DC | PRN
  Administered 2016-08-07: 50 ug via INTRAVENOUS
  Administered 2016-08-07: 25 ug via INTRAVENOUS

## 2016-08-07 MED ORDER — ZOLPIDEM TARTRATE 5 MG PO TABS: 5.0000 mg | ORAL_TABLET | Freq: Every evening | ORAL | Status: DC | PRN

## 2016-08-07 MED ORDER — ACETAMINOPHEN 325 MG PO TABS
650.0000 mg | ORAL_TABLET | ORAL | Status: DC | PRN
  Administered 2016-08-07: 650 mg via ORAL
  Filled 2016-08-07 (×2): qty 2

## 2016-08-07 MED ORDER — NITROGLYCERIN 1 MG/10 ML FOR IR/CATH LAB
INTRA_ARTERIAL | Status: AC
  Filled 2016-08-07: qty 10

## 2016-08-07 MED ORDER — NITROGLYCERIN IN D5W 200-5 MCG/ML-% IV SOLN
INTRAVENOUS | Status: DC | PRN
  Administered 2016-08-07: 20 ug/min via INTRAVENOUS

## 2016-08-07 MED ORDER — ACETAMINOPHEN 325 MG PO TABS
650.0000 mg | ORAL_TABLET | ORAL | Status: DC | PRN
  Administered 2016-08-08 (×2): 650 mg via ORAL
  Filled 2016-08-07: qty 2

## 2016-08-07 MED ORDER — VERAPAMIL HCL 2.5 MG/ML IV SOLN
INTRAVENOUS | Status: DC | PRN
  Administered 2016-08-07: 10 mL via INTRA_ARTERIAL

## 2016-08-07 MED ORDER — ASPIRIN 81 MG PO CHEW
324.0000 mg | CHEWABLE_TABLET | Freq: Once | ORAL | Status: AC
  Administered 2016-08-07: 324 mg via ORAL
  Filled 2016-08-07: qty 4

## 2016-08-07 MED ORDER — TRIAMCINOLONE ACETONIDE 55 MCG/ACT NA AERO
1.0000 | INHALATION_SPRAY | Freq: Every day | NASAL | Status: DC
  Filled 2016-08-07: qty 10.8

## 2016-08-07 MED ORDER — HEPARIN (PORCINE) IN NACL 2-0.9 UNIT/ML-% IJ SOLN
INTRAMUSCULAR | Status: AC
  Filled 2016-08-07: qty 500

## 2016-08-07 MED ORDER — SODIUM CHLORIDE 0.9 % WEIGHT BASED INFUSION
1.0000 mL/kg/h | INTRAVENOUS | Status: AC
  Administered 2016-08-07: 15:00:00 1 mL/kg/h via INTRAVENOUS

## 2016-08-07 MED ORDER — IOPAMIDOL (ISOVUE-370) INJECTION 76%
INTRAVENOUS | Status: AC
  Filled 2016-08-07: qty 100

## 2016-08-07 MED ORDER — NITROGLYCERIN 0.4 MG SL SUBL
0.4000 mg | SUBLINGUAL_TABLET | SUBLINGUAL | Status: AC | PRN
Start: 2016-08-07 — End: 2016-08-07
  Administered 2016-08-07 (×3): 0.4 mg via SUBLINGUAL
  Filled 2016-08-07: qty 1

## 2016-08-07 MED ORDER — NITROGLYCERIN 0.4 MG SL SUBL: 0.4000 mg | SUBLINGUAL_TABLET | SUBLINGUAL | Status: DC | PRN

## 2016-08-07 MED ORDER — ALBUTEROL SULFATE (2.5 MG/3ML) 0.083% IN NEBU: 5.0000 mg | INHALATION_SOLUTION | Freq: Once | RESPIRATORY_TRACT | Status: DC

## 2016-08-07 MED ORDER — MIDAZOLAM HCL 2 MG/2ML IJ SOLN
INTRAMUSCULAR | Status: DC | PRN
  Administered 2016-08-07 (×3): 1 mg via INTRAVENOUS

## 2016-08-07 MED ORDER — HEPARIN SODIUM (PORCINE) 1000 UNIT/ML IJ SOLN
INTRAMUSCULAR | Status: AC
  Filled 2016-08-07: qty 1

## 2016-08-07 MED ORDER — ADENOSINE 12 MG/4ML IV SOLN
INTRAVENOUS | Status: AC
  Filled 2016-08-07: qty 16

## 2016-08-07 MED ORDER — LIDOCAINE HCL (PF) 1 % IJ SOLN
INTRAMUSCULAR | Status: DC | PRN
  Administered 2016-08-07: 3 mL via INTRADERMAL

## 2016-08-07 MED ORDER — ~~LOC~~ CARDIAC SURGERY, PATIENT & FAMILY EDUCATION
Freq: Once | Status: AC
  Administered 2016-08-07: 21:00:00
  Filled 2016-08-07: qty 1

## 2016-08-07 MED ORDER — HEPARIN BOLUS VIA INFUSION
3500.0000 [IU] | Freq: Once | INTRAVENOUS | Status: AC
  Administered 2016-08-07: 3500 [IU] via INTRAVENOUS
  Filled 2016-08-07: qty 3500

## 2016-08-07 MED ORDER — FENTANYL CITRATE (PF) 100 MCG/2ML IJ SOLN
25.0000 ug | INTRAMUSCULAR | Status: DC | PRN
  Administered 2016-08-07 – 2016-08-08 (×5): 50 ug via INTRAVENOUS
  Filled 2016-08-07 (×5): qty 2

## 2016-08-07 MED ORDER — MOVING RIGHT ALONG BOOK
Freq: Once | Status: AC
  Administered 2016-08-07: 21:00:00
  Filled 2016-08-07: qty 1

## 2016-08-07 MED ORDER — SODIUM CHLORIDE 0.9% FLUSH: 3.0000 mL | Freq: Two times a day (BID) | INTRAVENOUS | Status: DC

## 2016-08-07 MED ORDER — ONDANSETRON HCL 4 MG/2ML IJ SOLN
4.0000 mg | Freq: Once | INTRAMUSCULAR | Status: AC
  Administered 2016-08-07: 4 mg via INTRAVENOUS
  Filled 2016-08-07: qty 2

## 2016-08-07 MED ORDER — INSULIN ASPART 100 UNIT/ML ~~LOC~~ SOLN: 10.0000 [IU] | Freq: Three times a day (TID) | SUBCUTANEOUS | Status: DC

## 2016-08-07 MED ORDER — MAGNESIUM HYDROXIDE 400 MG/5ML PO SUSP
30.0000 mL | ORAL | Status: DC | PRN
  Administered 2016-08-08: 30 mL via ORAL
  Filled 2016-08-07 (×2): qty 30

## 2016-08-07 MED ORDER — FENTANYL CITRATE (PF) 100 MCG/2ML IJ SOLN
INTRAMUSCULAR | Status: AC
  Filled 2016-08-07: qty 2

## 2016-08-07 MED ORDER — HEPARIN SODIUM (PORCINE) 5000 UNIT/ML IJ SOLN: 5000.0000 [IU] | Freq: Three times a day (TID) | INTRAMUSCULAR | Status: DC

## 2016-08-07 MED ORDER — SODIUM CHLORIDE 0.9 % IV SOLN: 250.0000 mL | INTRAVENOUS | Status: DC | PRN

## 2016-08-07 MED ORDER — INSULIN ASPART 100 UNIT/ML ~~LOC~~ SOLN
0.0000 [IU] | Freq: Three times a day (TID) | SUBCUTANEOUS | Status: DC
  Administered 2016-08-07: 8 [IU] via SUBCUTANEOUS
  Administered 2016-08-08: 3 [IU] via SUBCUTANEOUS
  Administered 2016-08-08: 07:00:00 2 [IU] via SUBCUTANEOUS

## 2016-08-07 MED ORDER — ENOXAPARIN SODIUM 40 MG/0.4ML ~~LOC~~ SOLN: 40.0000 mg | SUBCUTANEOUS | Status: DC

## 2016-08-07 MED ORDER — HYDRALAZINE HCL 20 MG/ML IJ SOLN
10.0000 mg | Freq: Three times a day (TID) | INTRAMUSCULAR | Status: DC | PRN
  Administered 2016-08-08: 10 mg via INTRAVENOUS
  Filled 2016-08-07: qty 1

## 2016-08-07 MED ORDER — HEPARIN (PORCINE) IN NACL 100-0.45 UNIT/ML-% IJ SOLN
700.0000 [IU]/h | INTRAMUSCULAR | Status: DC
  Administered 2016-08-07: 700 [IU]/h via INTRAVENOUS
  Filled 2016-08-07: qty 250

## 2016-08-07 MED ORDER — SODIUM CHLORIDE 0.9% FLUSH: 3.0000 mL | INTRAVENOUS | Status: DC | PRN

## 2016-08-07 MED ORDER — ALPRAZOLAM 0.25 MG PO TABS: 0.2500 mg | ORAL_TABLET | Freq: Two times a day (BID) | ORAL | Status: DC | PRN

## 2016-08-07 MED ORDER — IOPAMIDOL (ISOVUE-370) INJECTION 76%
INTRAVENOUS | Status: DC | PRN
  Administered 2016-08-07: 210 mL via INTRA_ARTERIAL

## 2016-08-07 MED ORDER — LIDOCAINE HCL (PF) 1 % IJ SOLN
INTRAMUSCULAR | Status: AC
  Filled 2016-08-07: qty 30

## 2016-08-07 MED ORDER — ONDANSETRON HCL 4 MG/2ML IJ SOLN
4.0000 mg | Freq: Four times a day (QID) | INTRAMUSCULAR | Status: DC | PRN
Start: 2016-08-07 — End: 2016-08-08
  Administered 2016-08-07: 17:00:00 4 mg via INTRAVENOUS

## 2016-08-07 MED ORDER — ONDANSETRON HCL 4 MG/2ML IJ SOLN: 4.0000 mg | Freq: Four times a day (QID) | INTRAMUSCULAR | Status: DC | PRN

## 2016-08-07 MED ORDER — VERAPAMIL HCL 2.5 MG/ML IV SOLN
INTRAVENOUS | Status: AC
  Filled 2016-08-07: qty 2

## 2016-08-07 MED ORDER — HYDRALAZINE HCL 20 MG/ML IJ SOLN: 10.0000 mg | Freq: Three times a day (TID) | INTRAMUSCULAR | Status: DC | PRN

## 2016-08-07 MED ORDER — NITROGLYCERIN IN D5W 200-5 MCG/ML-% IV SOLN
20.0000 ug/min | INTRAVENOUS | Status: DC
  Administered 2016-08-07: 20 ug/min via INTRAVENOUS

## 2016-08-07 MED ORDER — ASPIRIN 81 MG PO CHEW
81.0000 mg | CHEWABLE_TABLET | Freq: Every day | ORAL | Status: DC
  Administered 2016-08-08: 81 mg via ORAL
  Filled 2016-08-07: qty 1

## 2016-08-07 MED ORDER — HEPARIN (PORCINE) IN NACL 2-0.9 UNIT/ML-% IJ SOLN
INTRAMUSCULAR | Status: DC | PRN
  Administered 2016-08-07: 1000 mL

## 2016-08-07 MED ORDER — LISINOPRIL 10 MG PO TABS
20.0000 mg | ORAL_TABLET | Freq: Once | ORAL | Status: AC
  Administered 2016-08-07: 18:00:00 20 mg via ORAL
  Filled 2016-08-07: qty 2

## 2016-08-07 MED ORDER — POTASSIUM CHLORIDE CRYS ER 20 MEQ PO TBCR
40.0000 meq | EXTENDED_RELEASE_TABLET | Freq: Once | ORAL | Status: AC
  Administered 2016-08-07: 40 meq via ORAL
  Filled 2016-08-07: qty 2

## 2016-08-07 MED ORDER — HEPARIN SODIUM (PORCINE) 1000 UNIT/ML IJ SOLN
INTRAMUSCULAR | Status: DC | PRN
  Administered 2016-08-07: 4000 [IU] via INTRAVENOUS
  Administered 2016-08-07: 1000 [IU] via INTRAVENOUS
  Administered 2016-08-07: 4000 [IU] via INTRAVENOUS

## 2016-08-07 MED ORDER — NITROGLYCERIN IN D5W 200-5 MCG/ML-% IV SOLN
INTRAVENOUS | Status: AC
  Filled 2016-08-07: qty 250

## 2016-08-07 MED ORDER — INSULIN ASPART 100 UNIT/ML ~~LOC~~ SOLN
0.0000 [IU] | Freq: Every day | SUBCUTANEOUS | Status: DC
  Administered 2016-08-07: 5 [IU] via SUBCUTANEOUS

## 2016-08-07 MED ORDER — INSULIN ASPART PROT & ASPART (70-30 MIX) 100 UNIT/ML ~~LOC~~ SUSP
30.0000 [IU] | Freq: Two times a day (BID) | SUBCUTANEOUS | Status: DC
  Administered 2016-08-07 – 2016-08-08 (×3): 30 [IU] via SUBCUTANEOUS
  Filled 2016-08-07 (×2): qty 10

## 2016-08-07 MED ORDER — HEPARIN (PORCINE) IN NACL 100-0.45 UNIT/ML-% IJ SOLN
1050.0000 [IU]/h | INTRAMUSCULAR | Status: DC
  Administered 2016-08-07: 20:00:00 700 [IU]/h via INTRAVENOUS
  Administered 2016-08-08: 1050 [IU]/h via INTRAVENOUS
  Filled 2016-08-07 (×2): qty 250

## 2016-08-07 MED ORDER — SODIUM CHLORIDE 0.9% FLUSH
3.0000 mL | Freq: Two times a day (BID) | INTRAVENOUS | Status: DC
  Administered 2016-08-08: 3 mL via INTRAVENOUS

## 2016-08-07 SURGICAL SUPPLY — 17 items
CATH EXPO 5F FL3.5 (CATHETERS) ×3 IMPLANT
CATH INFINITI 5 FR AR1 MOD (CATHETERS) ×3 IMPLANT
CATH INFINITI 5FR AL1 (CATHETERS) ×3 IMPLANT
CATH INFINITI JR4 5F (CATHETERS) ×3 IMPLANT
CATH LAUNCHER 5F EBU3.0 (CATHETERS) ×1 IMPLANT
CATHETER LAUNCHER 5F EBU3.0 (CATHETERS) ×3
DEVICE RAD COMP TR BAND LRG (VASCULAR PRODUCTS) ×3 IMPLANT
GLIDESHEATH SLEND A-KIT 6F 22G (SHEATH) ×3 IMPLANT
GUIDEWIRE INQWIRE 1.5J.035X260 (WIRE) ×1 IMPLANT
GUIDEWIRE PRESSURE COMET II (WIRE) ×3 IMPLANT
INQWIRE 1.5J .035X260CM (WIRE) ×3
KIT ENCORE 26 ADVANTAGE (KITS) ×3 IMPLANT
KIT HEART LEFT (KITS) ×3 IMPLANT
PACK CARDIAC CATHETERIZATION (CUSTOM PROCEDURE TRAY) ×3 IMPLANT
TRANSDUCER W/STOPCOCK (MISCELLANEOUS) ×3 IMPLANT
TUBING CIL FLEX 10 FLL-RA (TUBING) ×3 IMPLANT
WIRE HI TORQ VERSACORE-J 145CM (WIRE) ×3 IMPLANT

## 2016-08-07 NOTE — Progress Notes (Signed)
TR BAND REMOVAL  LOCATION:    right radial  DEFLATED PER PROTOCOL:    Yes.    TIME BAND OFF / DRESSING APPLIED:    19:30   SITE UPON ARRIVAL:    Level 0  SITE AFTER BAND REMOVAL:    Level 0  CIRCULATION SENSATION AND MOVEMENT:    Within Normal Limits   Yes.    COMMENTS:   Post TR band instructions given. Pt tolerated well. 

## 2016-08-07 NOTE — Consult Note (Signed)
[address]       [place] [address]             [phone]        [name] [facility] Medical Record #[id number] Date of Birth: 03-Dec-1970  Referring: No ref. provider found Primary Care: No PCP Per Patient  Chief Complaint:    Chief Complaint  Patient presents with  . Shortness of Breath    History of Present Illness:   The patient is a 46 year old female who presented to ER On today's date for he presented after an episode of substernal chest pain at approximately 1 AM. The pain was severe radiated 10 over 10. Associated with diaphoresis as well as significant shortness of breath. She also felt quite weak. EMS was called and she was given aspirin and nitroglycerin and the pain continued. She has significant cardiac risk factors including poorly controlled diabetes. Her initial EKG showed no significant acute EKG changes. She was seen by cardiology in consultation. It was felt that she should proceed promptly to the cardiac catheterization lab. She was found to have severe multivessel coronary artery disease and we are consult and for consideration of CABG. Peak troponin I so far 0.10. She has not had any previous similar episodes in the past.  Current Activity/ Functional Status: Patient is independent with mobility/ambulation, transfers, ADL's, IADL's.   Zubrod Score: At the time of surgery this patient's most appropriate activity status/level should be described as: [x]     0    Normal activity, no symptoms []     1    Restricted in physical strenuous activity but ambulatory, able to do out light work []     2    Ambulatory and capable of self care, unable to do work activities, up and about                 more than 50%  Of the time                            []     3    Only limited self care, in bed greater than 50% of waking hours []     4    Completely disabled, no self care, confined to bed or chair []     5    Moribund  Past Medical  History:  Diagnosis Date  . History of kidney stones    "passed them"  . Hypertension   . Migraine    "daily" (08/07/2016)  . Pancreatic injury    "damage to pancrease related to gallbladder OR"  . Pneumonia X 1  . Type II diabetes mellitus (HCC)     Past Surgical History:  Procedure Laterality Date  . CARDIAC CATHETERIZATION  08/07/2016  . CARDIAC CATHETERIZATION N/A 08/07/2016   Procedure: Left Heart Cath and Coronary Angiography;  Surgeon: Lyn Records, MD;  Location[facility] INVASIVE CV LAB;  Service: Cardiovascular;  Laterality: N/A;  . CARDIAC CATHETERIZATION N/A 08/07/2016   Procedure: Intravascular Pressure Wire/FFR Study;  Surgeon: Lyn Records, MD;  Location: [facility] INVASIVE CV LAB;  Service: Cardiovascular;  Laterality: N/A;  . FACIAL RECONSTRUCTION SURGERY  2009   "facial fractures"  . FRACTURE SURGERY    . LAPAROSCOPIC CHOLECYSTECTOMY    . TUBAL LIGATION      History  Smoking Status  . Current Every Day Smoker  . Packs/day: 0.50  . Years: 20.00  .  Types: Cigarettes  Smokeless Tobacco  . Never Used    History  Alcohol Use No    Social History   Social History  . Marital status: Married    Spouse name: N/A  . Number of children: N/A  . Years of education: N/A   Occupational History  . Supervisor [employer]   Social History Main Topics  . Smoking status: Current Every Day Smoker    Packs/day: 0.50    Years: 20.00    Types: Cigarettes  . Smokeless tobacco: Never Used  . Alcohol use No  . Drug use: No  . Sexual activity: Yes    Birth control/ protection: None   Other Topics Concern  . Not on file   Social History Narrative   Lives with husband and daughter    Allergies  Allergen Reactions  . Eugenol Hives and Other (See Comments)    Burns mouth  . Hydrochlorothiazide Anaphylaxis  . Morphine And Related Anaphylaxis  . Percocet [Oxycodone-Acetaminophen] Anaphylaxis and Nausea And Vomiting  . Shellfish Allergy Anaphylaxis  . Sulfa Antibiotics  Anaphylaxis  . Tomato Anaphylaxis    Current Facility-Administered Medications  Medication Dose Route Frequency Provider Last Rate Last Dose  . fentaNYL (SUBLIMAZE) injection 25-50 mcg  25-50 mcg Intravenous Q2H PRN Joline Salt Barrett, PA-C   50 mcg at 08/07/16 0957  . heparin ADULT infusion 100 units/mL (25000 units/253mL sodium chloride 0.45%)  700 Units/hr Intravenous Continuous Marquita Palms, RPH 7 mL/hr at 08/07/16 0803 700 Units/hr at 08/07/16 0803  . insulin aspart protamine- aspart (NOVOLOG MIX 70/30) injection 30 Units  30 Units Subcutaneous BID WC Rhonda G Barrett, PA-C      . ondansetron (ZOFRAN) injection 4 mg  4 mg Intravenous Q6H PRN Rhonda G Barrett, PA-C      . triamcinolone (NASACORT) nasal inhaler 1 spray  1 spray Each Nare Daily Rhonda G Barrett, PA-C        Prescriptions Prior to Admission  Medication Sig Dispense Refill Last Dose  . insulin lispro (HUMALOG) 100 UNIT/ML injection Inject 5-20 Units into the skin 3 (three) times daily before meals. Takes 5-20 units as directed per sliding scale   08/06/2016 at Unknown time  . triamcinolone (NASACORT ALLERGY 24HR) 55 MCG/ACT AERO nasal inhaler Place 1 spray into both nostrils daily.    08/06/2016 at Unknown time  . insulin NPH-regular Human (NOVOLIN 70/30) (70-30) 100 UNIT/ML injection Inject 25-75 Units into the skin 2 (two) times daily. 75 units in the morning and 25 units in the evening   Not Taking at Unknown time    Family History  Problem Relation Age of Onset  . Hypertension Mother   . Diabetes Mother   . Hypertension Father   . Diabetes Father   . Heart attack Father 53    had CABG    Review of Systems  Constitutional: Positive for diaphoresis. Negative for chills, fever, malaise/fatigue and weight loss.  HENT: Positive for congestion. Negative for ear discharge, ear pain, hearing loss, nosebleeds, sore throat and tinnitus.   Eyes: Negative for blurred vision, double vision, photophobia, pain, discharge and redness.        Sees floaters Diagnosed with bilat cateracts  Respiratory: Positive for shortness of breath. Negative for cough, hemoptysis, sputum production, wheezing and stridor.   Cardiovascular: Positive for chest pain, orthopnea, claudication, leg swelling and PND. Negative for palpitations.  Gastrointestinal: Positive for abdominal pain, constipation and nausea. Negative for blood in stool, diarrhea, heartburn, melena and vomiting.  Genitourinary: Positive for frequency and urgency. Negative for dysuria, flank pain and hematuria.  Musculoskeletal: Negative.   Skin: Negative for itching and rash.  Neurological: Positive for tingling, sensory change, loss of consciousness and headaches. Negative for tremors, speech change, focal weakness, seizures and weakness.       Has passed out d/yt low sugars  Endo/Heme/Allergies: Positive for environmental allergies and polydipsia. Bruises/bleeds easily.  Psychiatric/Behavioral: Negative for depression, hallucinations, memory loss, substance abuse and suicidal ideas. The patient is nervous/anxious. The patient does not have insomnia.    Physical Exam: BP (!) 181/90 (BP Location: Right Arm)   Pulse 96   Temp 98.6 F (37 C) (Oral)   Resp 15   Ht 4\' 11"  (1.499 m)   Wt 172 lb (78 kg)   LMP 07/19/2016   SpO2 99%   BMI 34.74 kg/m   Physical Exam  Constitutional: She appears acutely ill.  HENT:  Nose: Nose normal.  Mouth/Throat: No dental caries. Oropharynx is clear. Pharynx is normal.  Upper - edentulous  Eyes: Conjunctivae are normal. Pupils are equal, round, and reactive to light.  Neck: Normal range of motion and thyroid normal. Neck supple. No JVD present. No neck adenopathy. No thyromegaly present.  Pulmonary/Chest: Effort normal and breath sounds normal. She has no wheezes. She has no rales. She exhibits no tenderness.  Abdominal: Soft. Bowel sounds are normal. She exhibits no distension and no mass. There is no hepatomegaly. There is no  tenderness.  Musculoskeletal: She exhibits no edema, tenderness or deformity.  Neurological: She is alert and oriented to person, place, and time. She has normal motor skills.  Skin: Skin is warm. No rash noted. No cyanosis. No jaundice or pallor. Nails show no clubbing.      Diagnostic Studies & Laboratory data:     Recent Radiology Findings:   Dg Chest 2 View  Result Date: 08/07/2016 CLINICAL DATA:  Shortness of breath with pain across the top of the chest tonight. EXAM: CHEST  2 VIEW COMPARISON:  05/28/2014 FINDINGS: Normal heart size and pulmonary vascularity. No focal airspace disease or consolidation in the lungs. No blunting of costophrenic angles. No pneumothorax. Mediastinal contours appear intact. Mild degenerative changes in the spine. Surgical clips in the right upper quadrant. IMPRESSION: No active cardiopulmonary disease. Electronically Signed   By: Burman Nieves M.D.   On: 08/07/2016 02:18      The left ventricular systolic function is normal.  The left ventricular ejection fraction is 50-55% by visual estimate.  1st Mrg lesion, 75 %stenosed.  Ost 2nd Mrg to 2nd Mrg lesion, 50 %stenosed.  Ost 3rd Mrg to 3rd Mrg lesion, 90 %stenosed.  3rd Mrg lesion, 75 %stenosed.  Lat 3rd Mrg lesion, 90 %stenosed.  Ost LPDA to LPDA lesion, 50 %stenosed.  Prox LAD to Mid LAD lesion, 80 %stenosed.  Ost 1st Diag to 1st Diag lesion, 85 %stenosed.    Severe diffuse diabetic coronary disease with segmental mid LAD stenosis in the 70-80% range, with abnormal FFR of 0.79 from contrast-induced hyperemia; severe first and second diagonal disease (the diagonals are relatively small).  Severe diffuse disease in the circumflex territory with 90% obstruction in the mid body of the first obtuse marginal, diffuse disease in the second obtuse marginal which is a relatively small branching vessel, 80 and 70% tandem stenoses in the large trifurcating third obtuse marginal, and moderate diffuse  disease in the circumflex PDA and left ventricular branches. Left dominant coronary circulation.  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 The right coronary is nondominant.  Selective engagement was not possible. It appears that the RCA supplies only the right ventricle.  Overall normal LV function with EF 50%. Mild mid anterior wall hypokinesis. Elevated end-diastolic pressure.  RECOMMENDATIONS:   TCTS consultation to consider LIMA to LAD and saphenous vein grafting of the circumflex and diagonal branches with critical disease. Also, consider grafting the nondominant right coronary if large enough since it was never selectively engaged and likely has coronary disease if it is still patent.  IV heparin  IV nitroglycerin  Hold thienopyridine.   Indications   NSTEMI (non-ST elevated myocardial infarction) (HCC) [I21.4 (ICD-10-CM)]  Procedural Details/Technique   Technical Details The right radial area was sterilely prepped and draped. Intravenous sedation with Versed and fentanyl was administered. 1% Xylocaine was infiltrated to achieve local analgesia. A double wall stick with an angiocath was utilized to obtain intra-arterial access. The modified Seldinger technique was used to place a 56F " Slender" sheath in the right radial artery. Weight based heparin was administered. Coronary angiography was done using 5 F catheters. Right coronary angiography was performed with a JR4. Left ventricular hemodymic recordings and angiography was done using the JR 4 catheter and hand injection. Left coronary angiography was performed with a JL 3.5 cm.  Hemostasis was achieved using a pneumatic band.  During this procedure the patient is administered a total of Versed 3 mg and Fentanyl 75 mg to achieve and maintain moderate conscious sedation. The patient's heart rate, blood pressure, and oxygen saturation are monitored continuously during the procedure. The period of  conscious sedation is 61 minutes, of which I was present face-to-face 100% of this time.   Estimated blood loss <50 mL.  During this procedure the patient was administered the following to achieve and maintain moderate conscious sedation: Versed 3 mg, Fentanyl 75 mcg, while the patient's heart rate, blood pressure, and oxygen saturation were continuously monitored. The period of conscious sedation was 61 minutes, of which I was present face-to-face 100% of this time.    Coronary Findings   Dominance: Left  Left Anterior Descending  Prox LAD to Mid LAD lesion, 80% stenosed. The lesion is segmental, tubular and eccentric. Pressure wire/FFR was performed on the lesion. FFR: 0.78.  First Diagonal Branch  Vessel is small in size.  Ost 1st Diag to 1st Diag lesion, 85% stenosed.  Lateral First Diagonal Branch  Vessel is small in size.  Second Diagonal Branch  Vessel is small in size.  Ramus Intermedius  Vessel is small.  Left Circumflex  First Obtuse Marginal Branch  Vessel is small in size.  1st Mrg lesion, 75% stenosed.  Second Obtuse Marginal Branch  Vessel is small in size.  Ost 2nd Mrg to 2nd Mrg lesion, 50% stenosed.  Lateral Second Obtuse Marginal Branch  Vessel is small in size.  Third Obtuse Marginal Branch  Ost 3rd Mrg to 3rd Mrg lesion, 90% stenosed.  3rd Mrg lesion, 75% stenosed.  Lateral Third Obtuse Marginal Branch  Lat 3rd Mrg lesion, 90% stenosed.  Left Posterior Descending Artery  Ost LPDA to LPDA lesion, 50% stenosed. The lesion is tubular.  Wall Motion   Resting               Left Heart   Left Ventricle The left ventricular size is normal. The left ventricular systolic function is normal. The left ventricular ejection fraction is 50-55% by visual estimate.    Coronary Diagrams   Diagnostic  Diagram          I have independently reviewed the above radiologic studies.  Recent Lab Findings: Lab Results  Component Value Date   WBC 10.8 (H)  08/07/2016   HGB 13.5 08/07/2016   HCT 38.2 08/07/2016   PLT 269 08/07/2016   GLUCOSE 107 (H) 08/07/2016   ALT 12 (L) 08/07/2016   AST 16 08/07/2016   NA 137 08/07/2016   K 3.1 (L) 08/07/2016   CL 109 08/07/2016   CREATININE 0.52 08/07/2016   BUN 9 08/07/2016   CO2 21 (L) 08/07/2016   INR 1.16 08/07/2016      Assessment / Plan:   Severe multivessel coronary artery disease, with significant distal disease but also proximal disease  Poorly controlled diabetes mellitus. Poorly controlled hypertension Unknown lipid status Multiple allergies reported as causing anaphylaxis- she says caused her "heart to race" Risks and options discussed with the patient and her husband  Plan: CABG Friday   Delight Ovens MD      [address] [place] [place] [address] Office [phone]   Beeper [phone]

## 2016-08-07 NOTE — ED Notes (Signed)
Permit signed

## 2016-08-07 NOTE — Care Management Note (Signed)
Case Management Note  Patient Details  Name: [name] MRN: [id number] Date of Birth: 06/01/1971  Subjective/Objective:    Presents with chest pain, elevated trop, s/p cath has severe CAD,plan for CABG.  NCM will cont to follow for dc needs.                Action/Plan:   Expected Discharge Date:                  Expected Discharge Plan:  Home/Self Care  In-House Referral:     Discharge planning Services  CM Consult  Post Acute Care Choice:    Choice offered to:     DME Arranged:    DME Agency:     HH Arranged:    HH Agency:     Status of Service:  In process, will continue to follow  If discussed at Long Length of Stay Meetings, dates discussed:    Additional Comments:  Leone Havenaylor, Revis Whalin Clinton, RN 08/07/2016, 3:53 PM

## 2016-08-07 NOTE — H&P (Signed)
History and Physical   Patient ID: [name] MRN: [id number], DOB/AGE: July 29, 1971 46 y.o. Date of Encounter: 08/07/2016  Primary Physician: No PCP Per Patient Primary Cardiologist: New  Chief Complaint:  NSTEMI  HPI: [name] is a 46 y.o. female with a history of DM, HTN. She has not had rx for HTN, has been able to get OTC insulin.  Pt was fine yesterday am. Last pm, she took pm insulin after supper. She was wakened about 1 am with substernal chest pain, 10/10, diaphoretic, very sob. Felt weak. Got something to drink, but no change. CBG 118.   EMS called, in ER she got ASA and SL NTG x 3, which helped the pain but it has not resolved. Still SOB.   She has never had this before.    Past Medical History:  Diagnosis Date  . Diabetes mellitus   . Hypertension     Surgical History:  Past Surgical History:  Procedure Laterality Date  . CHOLECYSTECTOMY    . FACIAL RECONSTRUCTION SURGERY    . TUBAL LIGATION       I have reviewed the patient's current medications. Prior to Admission medications   Medication Sig Start Date End Date Taking? Authorizing Provider  insulin lispro (HUMALOG) 100 UNIT/ML injection Inject 5-20 Units into the skin 3 (three) times daily before meals. Takes 5-20 units as directed per sliding scale   Yes Historical Provider, MD  triamcinolone (NASACORT ALLERGY 24HR) 55 MCG/ACT AERO nasal inhaler Place 1 spray into both nostrils daily.    Yes Historical Provider, MD  insulin NPH-regular Human (NOVOLIN 70/30) (70-30) 100 UNIT/ML injection Inject 25-75 Units into the skin 2 (two) times daily. 75 units in the morning and 25 units in the evening    Historical Provider, MD   Scheduled Meds: . potassium chloride  40 mEq Oral Once   Continuous Infusions: . heparin 700 Units/hr (08/07/16 0803)   PRN Meds:.  Allergies:  Allergies  Allergen Reactions  . Eugenol Hives and Other (See Comments)    Burns mouth  . Hydrochlorothiazide  Anaphylaxis  . Morphine And Related Anaphylaxis  . Percocet [Oxycodone-Acetaminophen] Anaphylaxis and Nausea And Vomiting  . Shellfish Allergy Anaphylaxis  . Sulfa Antibiotics Anaphylaxis  . Tomato Anaphylaxis    Social History   Social History  . Marital status: Married    Spouse name: N/A  . Number of children: N/A  . Years of education: N/A   Occupational History  . Supervisor [employer]   Social History Main Topics  . Smoking status: Current Every Day Smoker    Packs/day: 0.01    Types: Cigarettes  . Smokeless tobacco: Never Used  . Alcohol use No  . Drug use: No  . Sexual activity: Yes    Birth control/ protection: None   Other Topics Concern  . Not on file   Social History Narrative   Lives with husband and daughter    Family History  Problem Relation Age of Onset  . Hypertension Mother   . Diabetes Mother   . Hypertension Father   . Diabetes Father   . Heart attack Father 9    had CABG   Family Status  Relation Status  . Mother Alive  . Father Deceased    Review of Systems:   Full 14-point review of systems otherwise negative except as noted above.  Physical Exam: Blood pressure 153/87, pulse 90, temperature 98.1 F (36.7 C), temperature source Oral, resp. rate 18, height 4\' 11"  (  1.499 m), weight 172 lb (78 kg), last menstrual period 07/19/2016, SpO2 100 %. General: Well developed, well nourished,female in no acute distress. Head: Normocephalic, atraumatic, sclera non-icteric, no xanthomas, nares are without discharge. Dentition: good Neck: No carotid bruits. JVD not elevated. No thyromegally Lungs: Good expansion bilaterally. without wheezes or rhonchi.  Heart: Regular rate and rhythm with S1 S2.  No S3 or S4.  No murmur, no rubs, or gallops appreciated. Abdomen: Soft, non-tender, non-distended with normoactive bowel sounds. No hepatomegaly. No rebound/guarding. No obvious abdominal masses. Msk:  Strength and tone appear normal for age. No joint  deformities or effusions, no spine or costo-vertebral angle tenderness. Extremities: No clubbing or cyanosis. No edema.  Distal pedal pulses are 2+ in 4 extrem Neuro: Alert and oriented X 3. Moves all extremities spontaneously. No focal deficits noted. Psych:  Responds to questions appropriately with a normal affect. Skin: No rashes or lesions noted  Labs:   Lab Results  Component Value Date   WBC 10.8 (H) 08/07/2016   HGB 13.5 08/07/2016   HCT 38.2 08/07/2016   MCV 85.7 08/07/2016   PLT 269 08/07/2016    Recent Labs  08/07/16 0620  INR 1.16     Recent Labs Lab 08/07/16 0215 08/07/16 0237  NA  --  137  K  --  3.1*  CL  --  109  CO2  --  21*  BUN  --  9  CREATININE  --  0.52  CALCIUM  --  8.9  PROT 6.9  --   BILITOT 0.7  --   ALKPHOS 86  --   ALT 12*  --   AST 16  --   GLUCOSE  --  107*    Recent Labs  08/07/16 0621  TROPONINI 0.10*    Recent Labs  08/07/16 0239 08/07/16 0448  TROPIPOC 0.03 0.08    Radiology/Studies: Dg Chest 2 View  Result Date: 08/07/2016 CLINICAL DATA:  Shortness of breath with pain across the top of the chest tonight. EXAM: CHEST  2 VIEW COMPARISON:  05/28/2014 FINDINGS: Normal heart size and pulmonary vascularity. No focal airspace disease or consolidation in the lungs. No blunting of costophrenic angles. No pneumothorax. Mediastinal contours appear intact. Mild degenerative changes in the spine. Surgical clips in the right upper quadrant. IMPRESSION: No active cardiopulmonary disease. Electronically Signed   By: Burman Nieves M.D.   On: 08/07/2016 02:18     Cardiac Cath: n/a  Echo: n/a  ECG: 01/10 SR, mild J point elevation V1&2 is unchanged from 2015 Diffuse ST depression, minimal is different from 2015  ASSESSMENT AND PLAN:  Principal Problem:   ACS (acute coronary syndrome) (HCC) - pt with troponin trending up, although not very high - ECG is not a STEMI - however, with ongoing pain, mult CRFs, not controlled, cath  indicated - The risks and benefits of a cardiac catheterization including, but not limited to, death, stroke, MI, kidney damage and bleeding were discussed with the patient who indicates understanding and agrees to proceed.   Otherwise, assess for CRFs and their control. Active Problems:   Hypertension   Diabetes mellitus   Signed, Leanna Battles 08/07/2016 10:10 AM Beeper [phone]  History and all data above reviewed.  Patient examined.  I agree with the findings as above.  Chest pain started at rest at 1 am.  Consistent with new onset rest angina.  Significant cardiovascular risk with poorly controlled A1C.  Still with chest pain but without acute EKG changes.  The patient exam reveals COR:RRR, no rub   ,  Lungs: Clear  ,  Abd: Positive bowel sounds, no rebound no guarding, Ext No edema  .  All available labs, radiology testing, previous records reviewed. Agree with documented assessment and plan. Unstable angina:  Plan for urgent cath given ongoing pain.  The patient understands that risks included but are not limited to stroke (1 in 1000), death (1 in 1000), kidney failure [usually temporary] (1 in 500), bleeding (1 in 200), allergic reaction [possibly serious] (1 in 200).  The patient understands and agrees to proceed.   DM:  She needs a primary care physician and we will arrange that follow up before discharge.  Dyslipidemia: No recent lipids but I will start Lipitor 80 mg.     Fayrene FearingJames Cesario Weidinger  10:15 AM  08/07/2016

## 2016-08-07 NOTE — Progress Notes (Signed)
ANTICOAGULATION CONSULT NOTE - FOLLOW UP  Pharmacy Consult:  Heparin Indication: chest pain/ACS s/p cath, awaiting TCTS evaluation  Allergies  Allergen Reactions  . Eugenol Hives and Other (See Comments)    Burns mouth  . Hydrochlorothiazide Anaphylaxis  . Morphine And Related Anaphylaxis  . Percocet [Oxycodone-Acetaminophen] Anaphylaxis and Nausea And Vomiting  . Shellfish Allergy Anaphylaxis  . Sulfa Antibiotics Anaphylaxis  . Tomato Anaphylaxis    Patient Measurements: Height: 4\' 11"  (149.9 cm) Weight: 172 lb (78 kg) IBW/kg (Calculated) : 43.2 Heparin Dosing Weight: 61 kg  Vital Signs: Temp: 98.6 F (37 C) (01/10 1237) Temp Source: Oral (01/10 1237) BP: 181/90 (01/10 1237) Pulse Rate: 96 (01/10 1237)  Labs:  Recent Labs  08/07/16 0237 08/07/16 0620 08/07/16 0621 08/07/16 1355 08/07/16 1538  HGB 13.5  --   --   --   --   HCT 38.2  --   --   --   --   PLT 269  --   --   --   --   APTT  --  26  --   --   --   LABPROT  --  14.8  --   --   --   INR  --  1.16  --   --   --   HEPARINUNFRC  --   --   --  1.18*  --   CREATININE 0.52  --   --   --   --   TROPONINI  --   --  0.10*  --  0.06*    Estimated Creatinine Clearance: 80.9 mL/min (by C-G formula based on SCr of 0.52 mg/dL).    Assessment: [age] YOF with history of DM2 and HTN presented with sudden onset SOB, weakness, nausea, diaphoresis and chest pain.  Patient was started on IV heparin and then taken to the cath lab.  Pharmacy consulted to resume IV heparin now while awaiting TCTS evaluation.  No bleeding reported.   Goal of Therapy:  Heparin level 0.3-0.7 units/ml Monitor platelets by anticoagulation protocol: Yes    Plan:  - D/C heparin SQ - Resume heparin gtt at 700 units/hr, no bolus post cath - Check 6 hr heparin level - Daily heparin level and CBC   Izik Bingman D. Laney Potashang, PharmD, BCPS Pager:  [phone] 08/07/2016, 5:22 PM

## 2016-08-07 NOTE — ED Triage Notes (Signed)
Per EMS, pt from home. Pt woke up in the middle of the night feeling hot with chills, SOB, diaphoretic. Pt reports she also had some chest pain across chest. CBG 118. HX diabetes and HTN. Pt took both humulin & novolog before bed. Pt reports not taking Lisinopril past year.

## 2016-08-07 NOTE — ED Provider Notes (Signed)
[facility]-EMERGENCY DEPT Provider Note   CSN: [id number] Arrival date & time: 08/07/16  0130   By signing my name below, I, [name], attest that this documentation has been prepared under the direction and in the presence of Lindsey Rhineonald Marie Chow, MD  Electronically Signed: Clovis PuAvnee Vega, ED Scribe. 08/07/16. 2:09 AM.   History   Chief Complaint Chief Complaint  Patient presents with  . Shortness of Breath   The history is provided by the patient. No language interpreter was used.  Shortness of Breath  This is a new problem. The current episode started 1 to 2 hours ago. The problem has not changed since onset.Associated symptoms include chest pain and leg swelling. Pertinent negatives include no fever, no vomiting and no abdominal pain. She has tried nothing for the symptoms. Associated medical issues do not include past MI.   HPI Comments:  [name] is a 46 y.o. female, with a hx of DM, HTN and PSHx of cholecystectomy, who presents to the Emergency Department complaining of sudden onset SOB x prior to arrival. Pt also reports weakness, nausea, diaphoresis and chest pain which she describes as a pressure. Pt states it feels like there is a cinder block on her chest. Pt states she felt fine before she went to sleep. No alleviating factors noted. Pt denies a fever, vomiting, lower abdominal pain, a hx of stroke and a hx of MI.  Pt is a smoker.   Past Medical History:  Diagnosis Date  . Diabetes mellitus   . Hypertension     There are no active problems to display for this patient.   Past Surgical History:  Procedure Laterality Date  . CHOLECYSTECTOMY    . FACIAL RECONSTRUCTION SURGERY    . TUBAL LIGATION      OB History    No data available       Home Medications    Prior to Admission medications   Medication Sig Start Date End Date Taking? Authorizing Provider  insulin glargine (LANTUS) 100 UNIT/ML injection Inject 25-75 Units into the skin 2 (two) times daily.  25 units in the am & 75 units as night    Historical Provider, MD  insulin lispro (HUMALOG) 100 UNIT/ML injection Inject 5-20 Units into the skin 3 (three) times daily before meals. Takes 5-20 units as directed per sliding scale    Historical Provider, MD  lisinopril (PRINIVIL,ZESTRIL) 30 MG tablet Take 1 tablet (30 mg total) by mouth daily. 05/28/14   Lindsey GlennAnn Batista, MD  MetFORMIN HCl (GLUCOPHAGE PO) Take by mouth.    Historical Provider, MD  ondansetron (ZOFRAN-ODT) 8 MG disintegrating tablet Take 0.5 tablets (4 mg total) by mouth once. 02/02/16   [name], MD  triamcinolone (NASACORT ALLERGY 24HR) 55 MCG/ACT AERO nasal inhaler Place 1 spray into both nostrils every other day.    Historical Provider, MD    Family History Family History  Problem Relation Age of Onset  . Hypertension Mother   . Diabetes Mother   . Hypertension Father   . Diabetes Father     Social History Social History  Substance Use Topics  . Smoking status: Current Every Day Smoker    Packs/day: 0.01    Types: Cigarettes  . Smokeless tobacco: Never Used  . Alcohol use No     Allergies   Eugenol; Hydrochlorothiazide; Morphine and related; Percocet [oxycodone-acetaminophen]; Shellfish allergy; Sulfa antibiotics; and Tomato   Review of Systems Review of Systems  Constitutional: Positive for diaphoresis. Negative for fever.  Respiratory:  Positive for shortness of breath.   Cardiovascular: Positive for chest pain and leg swelling.  Gastrointestinal: Positive for nausea. Negative for abdominal pain and vomiting.  Neurological: Positive for weakness.  All other systems reviewed and are negative.  Physical Exam Updated Vital Signs BP 152/75 (BP Location: Right Arm)   Pulse 80   Temp 98.1 F (36.7 C) (Oral)   Resp 15   Ht 4\' 11"  (1.499 m)   Wt 172 lb (78 kg)   LMP 07/19/2016   SpO2 100%   BMI 34.74 kg/m   Physical Exam CONSTITUTIONAL: Well developed/well nourished HEAD:  Normocephalic/atraumatic EYES: EOMI/PERRL ENMT: Mucous membranes moist NECK: supple no meningeal signs SPINE/BACK:entire spine nontender CV: S1/S2 noted, no murmurs/rubs/gallops noted LUNGS: Lungs are clear to auscultation bilaterally, no apparent distress ABDOMEN: soft, nontender, no rebound or guarding, bowel sounds noted throughout abdomen GU:no cva tenderness NEURO: Pt is awake/alert/appropriate, moves all extremitiesx4.  No facial droop.   EXTREMITIES: pulses normal/equal, full ROM SKIN: warm, color normal PSYCH: no abnormalities of mood noted, alert and oriented to situation   ED Treatments / Results  DIAGNOSTIC STUDIES:  Oxygen Saturation is 100% on RA, normal by my interpretation.    COORDINATION OF CARE:  2:07 AM Discussed treatment plan with pt at bedside and pt agreed to plan.  Labs (all labs ordered are listed, but only abnormal results are displayed) Labs Reviewed  BASIC METABOLIC PANEL - Abnormal; Notable for the following:       Result Value   Potassium 3.1 (*)    CO2 21 (*)    Glucose, Bld 107 (*)    All other components within normal limits  CBC - Abnormal; Notable for the following:    WBC 10.8 (*)    All other components within normal limits  HEPATIC FUNCTION PANEL - Abnormal; Notable for the following:    Albumin 3.2 (*)    ALT 12 (*)    Bilirubin, Direct <0.1 (*)    All other components within normal limits  TROPONIN I - Abnormal; Notable for the following:    Troponin I 0.10 (*)    All other components within normal limits  CBG MONITORING, ED - Abnormal; Notable for the following:    Glucose-Capillary 102 (*)    All other components within normal limits  LIPASE, BLOOD  APTT  PROTIME-INR  I-STAT TROPOININ, ED  I-STAT TROPOININ, ED    EKG  EKG Interpretation  Date/Time:  Wednesday August 07 2016 03:59:49 EST Ventricular Rate:  100 PR Interval:    QRS Duration: 82 QT Interval:  403 QTC Calculation: 520 R Axis:   75 Text  Interpretation:  Sinus tachycardia LAE, consider biatrial enlargement Borderline ST depression, diffuse leads Borderline ST elevation, anterior leads Prolonged QT interval Baseline wander in lead(s) V2 V5 rate is faster when compared to prior Confirmed by Bebe Shaggy  MD, Sanja Elizardo ([id number]) on 08/07/2016 4:04:45 AM       Radiology Dg Chest 2 View  Result Date: 08/07/2016 CLINICAL DATA:  Shortness of breath with pain across the top of the chest tonight. EXAM: CHEST  2 VIEW COMPARISON:  05/28/2014 FINDINGS: Normal heart size and pulmonary vascularity. No focal airspace disease or consolidation in the lungs. No blunting of costophrenic angles. No pneumothorax. Mediastinal contours appear intact. Mild degenerative changes in the spine. Surgical clips in the right upper quadrant. IMPRESSION: No active cardiopulmonary disease. Electronically Signed   By: Burman Nieves M.D.   On: 08/07/2016 02:18    Procedures Procedures CRITICAL  CARE Performed by: Joya Gaskins Total critical care time: 33 minutes Critical care time was exclusive of separately billable procedures and treating other patients. Critical care was necessary to treat or prevent imminent or life-threatening deterioration. Critical care was time spent personally by me on the following activities: development of treatment plan with patient and/or surrogate as well as nursing, discussions with consultants, evaluation of patient's response to treatment, examination of patient, obtaining history from patient or surrogate, ordering and performing treatments and interventions, ordering and review of laboratory studies, ordering and review of radiographic studies, pulse oximetry and re-evaluation of patient's condition. PATIENT WITH NON-STEMI, PATIENT GIVEN ASA/NITRO AND HEPARIN ORDERED.  CARDIOLOGY CONSULTED   Medications Ordered in ED Medications  nitroGLYCERIN (NITROSTAT) SL tablet 0.4 mg (0.4 mg Sublingual Given 08/07/16 0405)  aspirin chewable  tablet 324 mg (324 mg Oral Given 08/07/16 0222)     Initial Impression / Assessment and Plan / ED Course  I have reviewed the triage vital signs and the nursing notes.  Pertinent labs & imaging results that were available during my care of the patient were reviewed by me and considered in my medical decision making (see chart for details).  Clinical Course     4:05 AM Pt reporting continued chest pain/pressure Repeat EKG unchanged Pt receiving NTG at this time Will need repeat EKG HEART score - 4 5:17 AM Pt with rising troponin No EKG changes Her pain is now improved with NTG Will need admission D/w cardiology fellow, he requests repeat troponin in one hour 7:28 AM Troponin elevated Pt with non-stemi Her pain is controlled at this time She is awake/alert, resting comfortably Will place on heparin for non-stemi Will admit to cardiology  Final Clinical Impressions(s) / ED Diagnoses   Final diagnoses:  Non-STEMI (non-ST elevated myocardial infarction) [facility])    New Prescriptions New Prescriptions   No medications on file  I personally performed the services described in this documentation, which was scribed in my presence. The recorded information has been reviewed and is accurate.        Lindsey Rhine, MD 08/07/16 [phone]

## 2016-08-07 NOTE — Interval H&P Note (Signed)
Cath Lab Visit (complete for each Cath Lab visit)  Clinical Evaluation Leading to the Procedure:   ACS: Yes.    Non-ACS:    Anginal Classification: CCS IV  Anti-ischemic medical therapy: Minimal Therapy (1 class of medications)  Non-Invasive Test Results: No non-invasive testing performed  Prior CABG: No previous CABG      History and Physical Interval Note:  08/07/2016 10:49 AM  [name]  has presented today for surgery, with the diagnosis of urgent  The various methods of treatment have been discussed with the patient and family. After consideration of risks, benefits and other options for treatment, the patient has consented to  Procedure(s): Left Heart Cath and Coronary Angiography (N/A) as a surgical intervention .  The patient's history has been reviewed, patient examined, no change in status, stable for surgery.  I have reviewed the patient's chart and labs.  Questions were answered to the patient's satisfaction.     Lyn RecordsHenry W Ibeth Fahmy III

## 2016-08-07 NOTE — Progress Notes (Signed)
ANTICOAGULATION CONSULT NOTE - Initial Consult  Pharmacy Consult for Heparin Indication: chest pain/ACS  Allergies  Allergen Reactions  . Eugenol Hives and Other (See Comments)    Burns mouth  . Hydrochlorothiazide Anaphylaxis  . Morphine And Related Anaphylaxis  . Percocet [Oxycodone-Acetaminophen] Anaphylaxis and Nausea And Vomiting  . Shellfish Allergy Anaphylaxis  . Sulfa Antibiotics Anaphylaxis  . Tomato Anaphylaxis    Patient Measurements: Height: 4\' 11"  (149.9 cm) Weight: 172 lb (78 kg) IBW/kg (Calculated) : 43.2 Heparin Dosing Weight: 61 kg  Vital Signs: Temp: 98.1 F (36.7 C) (01/10 0138) Temp Source: Oral (01/10 0138) BP: 148/100 (01/10 0715) Pulse Rate: 85 (01/10 0715)  Labs:  Recent Labs  08/07/16 0237 08/07/16 0620 08/07/16 0621  HGB 13.5  --   --   HCT 38.2  --   --   PLT 269  --   --   APTT  --  26  --   LABPROT  --  14.8  --   INR  --  1.16  --   CREATININE 0.52  --   --   TROPONINI  --   --  0.10*    Estimated Creatinine Clearance: 80.9 mL/min (by C-G formula based on SCr of 0.52 mg/dL).   Medical History: Past Medical History:  Diagnosis Date  . Diabetes mellitus   . Hypertension     Medications:   (Not in a hospital admission) Scheduled:  Infusions:   Assessment: 46yo female with history of DM2 and HTN presents with sudden onset SOB, weakness, nausea, diaphoresis and chest pain. Pharmacy is consulted to dose heparin for ACS/chest pain.   Goal of Therapy:  Heparin level 0.3-0.7 units/ml Monitor platelets by anticoagulation protocol: Yes   Plan:  Give 3500 units bolus x 1 Start heparin infusion at 700 units/hr Check anti-Xa level in 6 hours and daily while on heparin Continue to monitor H&H and platelets  Arlean Hoppingorey M. Newman PiesBall, PharmD, BCPS Clinical Pharmacist Pager [phone] 08/07/2016,7:32 AM

## 2016-08-07 NOTE — Progress Notes (Signed)
Patient complained of headache, PA paged and patient given fentanyl 50 mcg IV slowly and tylenol 650 mg PO for pain. No other pain medication available. Patient complained of headache without relief and high blood pressure. Patient on Nitroglycerin infusion at 20 mcg with continuing pain below left breast. Symptoms variable and change from pressure, to sharp pain at times. O2 2 liters on and patient resting in bed. Denies shortness of breath. Complained of nausea and given zofran 4mg  IV at 1635. Notified PA H Meng about patients headache and elevated blood pressure. Blood pressure slowly elevating, 216/93 at 1700. Orders given for lisinopril 20 mg po. Given at 1744 when available from pyxis. Sleeping quietly after lisinopril given. Blood pressure slowly decreasing. Report given to Sander Radonoselle Zarsona RN at [phone]. (order for hydralazine given my Azalee CourseHao Meng PA. Not given because of concern with patients severe allergies. Azalee CourseHao Meng PA notified and aware that blood pressure slowly decreasing with lisinopril given.)

## 2016-08-07 NOTE — ED Notes (Signed)
Pt continues to c/o pressure over left chest "like someone is pushing on it" and occasional sharp pains under left breast.

## 2016-08-08 ENCOUNTER — Inpatient Hospital Stay (HOSPITAL_COMMUNITY): Payer: BLUE CROSS/BLUE SHIELD

## 2016-08-08 DIAGNOSIS — I214 Non-ST elevation (NSTEMI) myocardial infarction: Principal | ICD-10-CM

## 2016-08-08 DIAGNOSIS — Z01810 Encounter for preprocedural cardiovascular examination: Secondary | ICD-10-CM

## 2016-08-08 DIAGNOSIS — I25110 Atherosclerotic heart disease of native coronary artery with unstable angina pectoris: Secondary | ICD-10-CM

## 2016-08-08 DIAGNOSIS — R079 Chest pain, unspecified: Secondary | ICD-10-CM

## 2016-08-08 LAB — VAS US DOPPLER PRE CABG
LEFT ECA DIAS: -11 cm/s
LEFT VERTEBRAL DIAS: 17 cm/s
Left CCA dist dias: -30 cm/s
Left CCA dist sys: -93 cm/s
Left CCA prox dias: 24 cm/s
Left CCA prox sys: 99 cm/s
Left ICA dist dias: -30 cm/s
Left ICA dist sys: -78 cm/s
Left ICA prox dias: -24 cm/s
Left ICA prox sys: -70 cm/s
RIGHT ECA DIAS: -11 cm/s
RIGHT VERTEBRAL DIAS: 24 cm/s
Right CCA prox dias: 18 cm/s
Right CCA prox sys: 101 cm/s
Right cca dist sys: -135 cm/s

## 2016-08-08 LAB — ECHOCARDIOGRAM COMPLETE
HEIGHTINCHES: 59 in
WEIGHTICAEL: 2752 [oz_av]

## 2016-08-08 LAB — URINALYSIS, COMPLETE (UACMP) WITH MICROSCOPIC
Bilirubin Urine: NEGATIVE
Glucose, UA: >=500 mg/dL — AB
Hgb urine dipstick: NEGATIVE
Ketones, ur: NEGATIVE mg/dL
Leukocytes, UA: NEGATIVE
Nitrite: NEGATIVE
Protein, ur: NEGATIVE mg/dL
Specific Gravity, Urine: 1.01 (ref 1.005–1.030)
pH: 5.5 (ref 5.0–8.0)

## 2016-08-08 LAB — COMPREHENSIVE METABOLIC PANEL
ALBUMIN: 2.7 g/dL — AB (ref 3.5–5.0)
ALK PHOS: 75 U/L (ref 38–126)
ALT: 10 U/L — AB (ref 14–54)
AST: 16 U/L (ref 15–41)
Anion gap: 6 (ref 5–15)
BUN: 8 mg/dL (ref 6–20)
CALCIUM: 8.5 mg/dL — AB (ref 8.9–10.3)
CO2: 22 mmol/L (ref 22–32)
CREATININE: 0.55 mg/dL (ref 0.44–1.00)
Chloride: 109 mmol/L (ref 101–111)
GFR calc Af Amer: >60 mL/min (ref >60)
GFR calc non Af Amer: >60 mL/min (ref >60)
GLUCOSE: 168 mg/dL — AB (ref 65–99)
Potassium: 3.6 mmol/L (ref 3.5–5.1)
SODIUM: 137 mmol/L (ref 135–145)
Total Bilirubin: 0.4 mg/dL (ref 0.3–1.2)
Total Protein: 5.7 g/dL — ABNORMAL LOW (ref 6.5–8.1)

## 2016-08-08 LAB — GLUCOSE, CAPILLARY
GLUCOSE-CAPILLARY: 147 mg/dL — AB (ref 65–99)
GLUCOSE-CAPILLARY: 186 mg/dL — AB (ref 65–99)
GLUCOSE-CAPILLARY: 135 mg/dL — AB (ref 65–99)
Glucose-Capillary: 79 mg/dL (ref 65–99)

## 2016-08-08 LAB — PULMONARY FUNCTION TEST
FEF 25-75 Post: 1.54 L/sec
FEF 25-75 Pre: 2.27 L/sec
FEF2575-%Change-Post: -32 %
FEF2575-%Pred-Post: 66 %
FEF2575-%Pred-Pre: 97 %
FEV1-%Change-Post: -7 %
FEV1-%Pred-Post: 92 %
FEV1-%Pred-Pre: 99 %
FEV1-Post: 1.86 L
FEV1-Pre: 2.01 L
FEV1FVC-%Change-Post: 1 %
FEV1FVC-%Pred-Pre: 99 %
FEV6-%Change-Post: -7 %
FEV6-%Pred-Post: 92 %
FEV6-%Pred-Pre: 99 %
FEV6-Post: 2.22 L
FEV6-Pre: 2.4 L
FEV6FVC-%Change-Post: 1 %
FEV6FVC-%Pred-Post: 102 %
FEV6FVC-%Pred-Pre: 101 %
FVC-%Change-Post: -8 %
FVC-%Pred-Post: 90 %
FVC-%Pred-Pre: 98 %
FVC-Post: 2.23 L
FVC-Pre: 2.44 L
Post FEV1/FVC ratio: 83 %
Post FEV6/FVC ratio: 99 %
Pre FEV1/FVC ratio: 82 %
Pre FEV6/FVC Ratio: 98 %

## 2016-08-08 LAB — CBC
HCT: 35.1 % — ABNORMAL LOW (ref 36.0–46.0)
HEMOGLOBIN: 12 g/dL (ref 12.0–15.0)
MCH: 30 pg (ref 26.0–34.0)
MCHC: 34.2 g/dL (ref 30.0–36.0)
MCV: 87.8 fL (ref 78.0–100.0)
Platelets: 223 10*3/uL (ref 150–400)
RBC: 4 MIL/uL (ref 3.87–5.11)
RDW: 14.3 % (ref 11.5–15.5)
WBC: 9.7 10*3/uL (ref 4.0–10.5)

## 2016-08-08 LAB — HEPARIN LEVEL (UNFRACTIONATED)
HEPARIN UNFRACTIONATED: 0.43 [IU]/mL (ref 0.30–0.70)
HEPARIN UNFRACTIONATED: 0.13 [IU]/mL — AB (ref 0.30–0.70)
Heparin Unfractionated: 0.29 IU/mL — ABNORMAL LOW (ref 0.30–0.70)

## 2016-08-08 LAB — BLOOD GAS, ARTERIAL
Acid-Base Excess: 1.8 mmol/L (ref 0.0–2.0)
Bicarbonate: 24.3 mmol/L (ref 20.0–28.0)
Drawn by: 244901
FIO2: 21
O2 Saturation: 98.5 %
Patient temperature: 98.6
pCO2 arterial: 28.8 mmHg — ABNORMAL LOW (ref 32.0–48.0)
pH, Arterial: 7.536 — ABNORMAL HIGH (ref 7.350–7.450)
pO2, Arterial: 110 mmHg — ABNORMAL HIGH (ref 83.0–108.0)

## 2016-08-08 LAB — LIPID PANEL
Cholesterol: 120 mg/dL (ref 0–200)
HDL: 35 mg/dL — AB (ref >40)
LDL CALC: 67 mg/dL (ref 0–99)
Total CHOL/HDL Ratio: 3.4 RATIO
Triglycerides: 89 mg/dL (ref <150)
VLDL: 18 mg/dL (ref 0–40)

## 2016-08-08 LAB — SURGICAL PCR SCREEN
MRSA, PCR: NEGATIVE
Staphylococcus aureus: NEGATIVE

## 2016-08-08 LAB — PROTIME-INR
INR: 1.15
Prothrombin Time: 14.8 seconds (ref 11.4–15.2)

## 2016-08-08 LAB — HEMOGLOBIN A1C
Hgb A1c MFr Bld: 13.3 % — ABNORMAL HIGH (ref 4.8–5.6)
MEAN PLASMA GLUCOSE: 335 mg/dL

## 2016-08-08 LAB — TROPONIN I: Troponin I: 0.07 ng/mL (ref <0.03)

## 2016-08-08 LAB — APTT: aPTT: 43 seconds — ABNORMAL HIGH (ref 24–36)

## 2016-08-08 MED ORDER — DEXTROSE 5 % IV SOLN
1.5000 g | INTRAVENOUS | Status: AC
  Administered 2016-08-09: .75 g via INTRAVENOUS
  Administered 2016-08-09: 1.5 g via INTRAVENOUS
  Filled 2016-08-08: qty 1.5

## 2016-08-08 MED ORDER — SODIUM CHLORIDE 0.9 % IV SOLN
INTRAVENOUS | Status: AC
  Administered 2016-08-09: 1.5 [IU]/h via INTRAVENOUS
  Filled 2016-08-08: qty 2.5

## 2016-08-08 MED ORDER — NITROGLYCERIN IN D5W 200-5 MCG/ML-% IV SOLN
2.0000 ug/min | INTRAVENOUS | Status: DC
  Filled 2016-08-08: qty 250

## 2016-08-08 MED ORDER — DOPAMINE-DEXTROSE 3.2-5 MG/ML-% IV SOLN
0.0000 ug/kg/min | INTRAVENOUS | Status: AC
  Administered 2016-08-09: 3 ug/kg/min via INTRAVENOUS
  Filled 2016-08-08: qty 250

## 2016-08-08 MED ORDER — DEXMEDETOMIDINE HCL IN NACL 400 MCG/100ML IV SOLN: 0.1000 ug/kg/h | INTRAVENOUS | Status: DC

## 2016-08-08 MED ORDER — METOPROLOL TARTRATE 25 MG PO TABS
25.0000 mg | ORAL_TABLET | Freq: Two times a day (BID) | ORAL | Status: DC
  Administered 2016-08-08 (×2): 25 mg via ORAL
  Filled 2016-08-08 (×2): qty 1

## 2016-08-08 MED ORDER — BISACODYL 5 MG PO TBEC: 5.0000 mg | DELAYED_RELEASE_TABLET | Freq: Once | ORAL | Status: DC

## 2016-08-08 MED ORDER — ATORVASTATIN CALCIUM 40 MG PO TABS
40.0000 mg | ORAL_TABLET | Freq: Every day | ORAL | Status: DC
  Administered 2016-08-08: 40 mg via ORAL
  Filled 2016-08-08: qty 1

## 2016-08-08 MED ORDER — EPINEPHRINE PF 1 MG/ML IJ SOLN: 0.0000 ug/min | INTRAVENOUS | Status: DC

## 2016-08-08 MED ORDER — TRANEXAMIC ACID (OHS) BOLUS VIA INFUSION: 15.0000 mg/kg | INTRAVENOUS | Status: DC

## 2016-08-08 MED ORDER — MAGNESIUM SULFATE 50 % IJ SOLN
40.0000 meq | INTRAMUSCULAR | Status: DC
  Filled 2016-08-08: qty 10

## 2016-08-08 MED ORDER — PLASMA-LYTE 148 IV SOLN: INTRAVENOUS | Status: DC

## 2016-08-08 MED ORDER — SODIUM CHLORIDE 0.9 % IV SOLN: INTRAVENOUS | Status: DC

## 2016-08-08 MED ORDER — DEXTROSE 5 % IV SOLN: 750.0000 mg | INTRAVENOUS | Status: DC

## 2016-08-08 MED ORDER — SODIUM CHLORIDE 0.9 % IV SOLN: 1.5000 mg/kg/h | INTRAVENOUS | Status: DC

## 2016-08-08 MED ORDER — ALBUTEROL SULFATE (2.5 MG/3ML) 0.083% IN NEBU
2.5000 mg | INHALATION_SOLUTION | Freq: Once | RESPIRATORY_TRACT | Status: AC
  Administered 2016-08-08: 14:00:00 2.5 mg via RESPIRATORY_TRACT

## 2016-08-08 MED ORDER — DEXTROSE 5 % IV SOLN
750.0000 mg | INTRAVENOUS | Status: DC
  Filled 2016-08-08: qty 750

## 2016-08-08 MED ORDER — VANCOMYCIN HCL 10 G IV SOLR
1250.0000 mg | INTRAVENOUS | Status: AC
  Administered 2016-08-09: 1250 mg via INTRAVENOUS
  Filled 2016-08-08: qty 1250

## 2016-08-08 MED ORDER — VANCOMYCIN HCL 10 G IV SOLR: 1250.0000 mg | INTRAVENOUS | Status: DC

## 2016-08-08 MED ORDER — MAGNESIUM SULFATE 50 % IJ SOLN: 40.0000 meq | INTRAMUSCULAR | Status: DC

## 2016-08-08 MED ORDER — METOPROLOL TARTRATE 12.5 MG HALF TABLET: 12.5000 mg | ORAL_TABLET | Freq: Once | ORAL | Status: DC

## 2016-08-08 MED ORDER — POTASSIUM CHLORIDE 2 MEQ/ML IV SOLN
80.0000 meq | INTRAVENOUS | Status: DC
  Filled 2016-08-08: qty 40

## 2016-08-08 MED ORDER — CHLORHEXIDINE GLUCONATE CLOTH 2 % EX PADS
6.0000 | MEDICATED_PAD | Freq: Once | CUTANEOUS | Status: AC
  Administered 2016-08-08: 6 via TOPICAL

## 2016-08-08 MED ORDER — TRANEXAMIC ACID (OHS) PUMP PRIME SOLUTION: 2.0000 mg/kg | INTRAVENOUS | Status: DC

## 2016-08-08 MED ORDER — DOPAMINE-DEXTROSE 3.2-5 MG/ML-% IV SOLN: 0.0000 ug/kg/min | INTRAVENOUS | Status: DC

## 2016-08-08 MED ORDER — LISINOPRIL 10 MG PO TABS
20.0000 mg | ORAL_TABLET | Freq: Every day | ORAL | Status: DC
  Administered 2016-08-08: 20 mg via ORAL
  Filled 2016-08-08: qty 2

## 2016-08-08 MED ORDER — TRANEXAMIC ACID 1000 MG/10ML IV SOLN
1.5000 mg/kg/h | INTRAVENOUS | Status: DC
  Filled 2016-08-08: qty 25

## 2016-08-08 MED ORDER — SODIUM CHLORIDE 0.9 % IV SOLN
INTRAVENOUS | Status: DC
  Filled 2016-08-08: qty 30

## 2016-08-08 MED ORDER — CEFUROXIME SODIUM 1.5 G IJ SOLR: 1.5000 g | INTRAMUSCULAR | Status: DC

## 2016-08-08 MED ORDER — PROMETHAZINE HCL 25 MG/ML IJ SOLN
25.0000 mg | Freq: Once | INTRAMUSCULAR | Status: AC
  Administered 2016-08-08: 25 mg via INTRAVENOUS
  Filled 2016-08-08: qty 1

## 2016-08-08 MED ORDER — PHENYLEPHRINE HCL 10 MG/ML IJ SOLN: 30.0000 ug/min | INTRAVENOUS | Status: DC

## 2016-08-08 MED ORDER — TRANEXAMIC ACID (OHS) PUMP PRIME SOLUTION
2.0000 mg/kg | INTRAVENOUS | Status: DC
  Filled 2016-08-08: qty 1.56

## 2016-08-08 MED ORDER — FLEET ENEMA 7-19 GM/118ML RE ENEM
1.0000 | ENEMA | Freq: Every day | RECTAL | Status: DC | PRN
  Administered 2016-08-08: 1 via RECTAL
  Filled 2016-08-08 (×2): qty 1

## 2016-08-08 MED ORDER — PHENYLEPHRINE HCL 10 MG/ML IJ SOLN
30.0000 ug/min | INTRAVENOUS | Status: DC
  Filled 2016-08-08: qty 2

## 2016-08-08 MED ORDER — PLASMA-LYTE 148 IV SOLN
INTRAVENOUS | Status: AC
  Administered 2016-08-09: 4 mL
  Filled 2016-08-08: qty 2.5

## 2016-08-08 MED ORDER — EPINEPHRINE PF 1 MG/ML IJ SOLN
0.0000 ug/min | INTRAVENOUS | Status: DC
  Filled 2016-08-08: qty 4

## 2016-08-08 MED ORDER — POTASSIUM CHLORIDE 2 MEQ/ML IV SOLN: 80.0000 meq | INTRAVENOUS | Status: DC

## 2016-08-08 MED ORDER — DEXMEDETOMIDINE HCL IN NACL 400 MCG/100ML IV SOLN
0.1000 ug/kg/h | INTRAVENOUS | Status: DC
  Filled 2016-08-08: qty 100

## 2016-08-08 MED ORDER — TEMAZEPAM 15 MG PO CAPS: 15.0000 mg | ORAL_CAPSULE | Freq: Once | ORAL | Status: DC | PRN

## 2016-08-08 MED ORDER — TRANEXAMIC ACID (OHS) BOLUS VIA INFUSION
15.0000 mg/kg | INTRAVENOUS | Status: AC
  Administered 2016-08-09: 1170 mg via INTRAVENOUS
  Filled 2016-08-08: qty 1170

## 2016-08-08 MED ORDER — CHLORHEXIDINE GLUCONATE 0.12 % MT SOLN
15.0000 mL | Freq: Once | OROMUCOSAL | Status: AC
Start: 2016-08-09 — End: 2016-08-09
  Administered 2016-08-09: 15 mL via OROMUCOSAL
  Filled 2016-08-08: qty 15

## 2016-08-08 MED ORDER — NITROGLYCERIN IN D5W 200-5 MCG/ML-% IV SOLN: 2.0000 ug/min | INTRAVENOUS | Status: DC

## 2016-08-08 MED ORDER — CHLORHEXIDINE GLUCONATE CLOTH 2 % EX PADS
6.0000 | MEDICATED_PAD | Freq: Once | CUTANEOUS | Status: AC
  Administered 2016-08-09: 6 via TOPICAL

## 2016-08-08 NOTE — Progress Notes (Signed)
CARDIAC REHAB PHASE I   Pt in bed, c/o headache, declines ambulation at this time. Cardiac surgery pre-op education completed with pt and husband at bedside. Reviewed IS, sternal precautions, activity progression, cardiac surgery booklet and cardiac surgery guidelines. Pt verbalized understanding. Pt has instructions to view cardiac surgery videos. Pt in bed, call bell within reach. Will follow post-op.   [phone] Joylene GrapesEmily C Sir Mallis, RN, BSN 08/08/2016 8:38 AM

## 2016-08-08 NOTE — Progress Notes (Signed)
Patient Name: [name] Date of Encounter: 08/08/2016  Primary Cardiologist: Dr. [facility] Problem List     Principal Problem:   Hypertension Active Problems:   ACS (acute coronary syndrome) (HCC)   Diabetes mellitus with cardiac complication (HCC)   Non-STEMI (non-ST elevated myocardial infarction) (HCC)   NSTEMI (non-ST elevated myocardial infarction) (HCC)     Subjective   Headache and nausea this AM and not feeling well.  No chest pain but she is just uncomfortable.    Inpatient Medications    Scheduled Meds: . aspirin  81 mg Oral Daily  . insulin aspart  0-15 Units Subcutaneous TID WC  . insulin aspart  0-5 Units Subcutaneous QHS  . insulin aspart protamine- aspart  30 Units Subcutaneous BID WC  . sodium chloride flush  3 mL Intravenous Q12H  . sodium chloride flush  3 mL Intravenous Q12H  . triamcinolone  1 spray Each Nare Daily   Continuous Infusions: . heparin 900 Units/hr (08/08/16 0500)   PRN Meds: sodium chloride, sodium chloride, acetaminophen, acetaminophen, ALPRAZolam, fentaNYL (SUBLIMAZE) injection, hydrALAZINE, magnesium hydroxide, nitroGLYCERIN, ondansetron (ZOFRAN) IV, ondansetron (ZOFRAN) IV, ondansetron (ZOFRAN) IV, sodium chloride flush, sodium chloride flush, zolpidem   Vital Signs    Vitals:   08/08/16 0300 08/08/16 0400 08/08/16 0500 08/08/16 0804  BP: (!) 150/82 (!) 165/83 (!) 151/83 (!) 166/79  Pulse: 88 86 84 87  Resp:    16  Temp:    98.6 F (37 C)  TempSrc:    Oral  SpO2: 99% 100% 100% 100%  Weight:      Height:        Intake/Output Summary (Last 24 hours) at 08/08/16 0956 Last data filed at 08/08/16 0807  Gross per 24 hour  Intake          1135.24 ml  Output             2150 ml  Net         -1014.76 ml   Filed Weights   08/07/16 0139  Weight: 172 lb (78 kg)    Physical Exam    GEN: NAD.  Neck:  No JVD Cardiac: Regular Rate and Rhythm, no murmurs, rubs, or gallops.  No edema.  Radials/DP/PT 2+   and equal bilaterally. Right wrist without bruising or bleeding Respiratory:  Respirations  regular and unlabored, clear to auscultation bilaterally. GI: Soft, nontender, nondistended, BS + x 4. Skin: warm and dry, no rash. Neuro:   Strength and sensation are intact. Psych:  AAOx3.  Normal affect.  Labs    CBC  Recent Labs  08/07/16 0237 08/08/16 0304  WBC 10.8* 9.7  HGB 13.5 12.0  HCT 38.2 35.1*  MCV 85.7 87.8  PLT 269 223   Basic Metabolic Panel  Recent Labs  08/07/16 0237 08/08/16 0304  NA 137 137  K 3.1* 3.6  CL 109 109  CO2 21* 22  GLUCOSE 107* 168*  BUN 9 8  CREATININE 0.52 0.55  CALCIUM 8.9 8.5*   Liver Function Tests  Recent Labs  08/07/16 0215 08/08/16 0304  AST 16 16  ALT 12* 10*  ALKPHOS 86 75  BILITOT 0.7 0.4  PROT 6.9 5.7*  ALBUMIN 3.2* 2.7*    Recent Labs  08/07/16 0215  LIPASE 26   Cardiac Enzymes  Recent Labs  08/07/16 1538 08/07/16 2053 08/08/16 0304  TROPONINI 0.06* 0.06* 0.07*   BNP Invalid input(s): POCBNP D-Dimer No results for input(s): DDIMER in the last 72 hours.  Hemoglobin A1C  Recent Labs  08/07/16 1538  HGBA1C 13.3*   Fasting Lipid Panel  Recent Labs  08/08/16 0304  CHOL 120  HDL 35*  LDLCALC 67  TRIG 89  CHOLHDL 3.4   Thyroid Function Tests No results for input(s): TSH, T4TOTAL, T3FREE, THYROIDAB in the last 72 hours.  Invalid input(s): FREET3  Telemetry    NSR - Personally Reviewed  ECG    NSR, rate 84, axis WNL, intervals WNL, lateral T wave inversion in I, AVL  - Personally Reviewed  Radiology    Dg Chest 2 View  Result Date: 08/07/2016 CLINICAL DATA:  Shortness of breath with pain across the top of the chest tonight. EXAM: CHEST  2 VIEW COMPARISON:  05/28/2014 FINDINGS: Normal heart size and pulmonary vascularity. No focal airspace disease or consolidation in the lungs. No blunting of costophrenic angles. No pneumothorax. Mediastinal contours appear intact. Mild degenerative changes  in the spine. Surgical clips in the right upper quadrant. IMPRESSION: No active cardiopulmonary disease. Electronically Signed   By: Burman NievesWilliam  Stevens M.D.   On: 08/07/2016 02:18    Cardiac Studies   Cath  The left ventricular systolic function is normal.  The left ventricular ejection fraction is 50-55% by visual estimate.  1st Mrg lesion, 75 %stenosed.  Ost 2nd Mrg to 2nd Mrg lesion, 50 %stenosed.  Ost 3rd Mrg to 3rd Mrg lesion, 90 %stenosed.  3rd Mrg lesion, 75 %stenosed.  Lat 3rd Mrg lesion, 90 %stenosed.  Ost LPDA to LPDA lesion, 50 %stenosed.  Prox LAD to Mid LAD lesion, 80 %stenosed. Ost 1st Diag to 1st Diag lesion, 85 %stenosed.  Patient Profile     [name] is a 46 y.o. female with a history of DM, HTN.   Presented with unstable angina and elevated troponin.  Diabetes is poorly controlled.    Assessment & Plan    NSTEMI:    Three vessel CAD.  CABG on Friday.   Continue heparin.  However, she is quite uncomfortable with headache.  I will stop the IV NTG.   Echo pending.   DM:    Poor control.  Consult diabetes management  Lab Results  Component Value Date   HGBA1C 13.3 (H) 08/07/2016   DYSLIPIDEMIA:  Start Lipitor Moderate dose.  Lab Results  Component Value Date   CHOL 120 08/08/2016   TRIG 89 08/08/2016   HDL 35 (L) 08/08/2016   LDLCALC 67 08/08/2016   HTN:  BP not well controlled.   Given lisinopril yesterday.  I will add this to her daily meds.   Signed, Rollene RotundaJames Jammy Plotkin, MD  08/08/2016, 9:56 AM

## 2016-08-08 NOTE — Progress Notes (Signed)
Pre-op Cardiac Surgery  Carotid Findings:  40-59% right ICA stenosis.  1-39% left ICA stenosis.  Vertebral artery flow is antegrade.   Upper Extremity Right Left  Brachial Pressures 168T 163T  Radial Waveforms T T  Ulnar Waveforms T T  Palmar Arch (Allen's Test) WNL WNL   Findings:      Lower  Extremity Right Left  Dorsalis Pedis    Anterior Tibial 205T 209T  Posterior Tibial 216T 232T  Ankle/Brachial Indices 1.29 1.4    Findings:  WNL

## 2016-08-08 NOTE — Progress Notes (Signed)
ANTICOAGULATION CONSULT NOTE - FOLLOW UP  Pharmacy Consult:  Heparin Indication: CAD - plan for CABG on Friday  Allergies  Allergen Reactions  . Eugenol Hives and Other (See Comments)    Burns mouth  . Hydrochlorothiazide Anaphylaxis  . Morphine And Related Anaphylaxis  . Percocet [Oxycodone-Acetaminophen] Anaphylaxis and Nausea And Vomiting  . Shellfish Allergy Anaphylaxis  . Sulfa Antibiotics Anaphylaxis  . Tomato Anaphylaxis    Patient Measurements: Height: 4\' 11"  (149.9 cm) Weight: 172 lb (78 kg) IBW/kg (Calculated) : 43.2 Heparin Dosing Weight: 61 kg  Vital Signs: Temp: 98.6 F (37 C) (01/11 0804) Temp Source: Oral (01/11 0804) BP: 166/79 (01/11 0804) Pulse Rate: 87 (01/11 0804)  Labs:  Recent Labs  08/07/16 0237 08/07/16 0620  08/07/16 1355 08/07/16 1538 08/07/16 2053 08/08/16 0028 08/08/16 0304 08/08/16 0745  HGB 13.5  --   --   --   --   --   --  12.0  --   HCT 38.2  --   --   --   --   --   --  35.1*  --   PLT 269  --   --   --   --   --   --  223  --   APTT  --  26  --   --   --   --   --  43*  --   LABPROT  --  14.8  --   --   --   --   --  14.8  --   INR  --  1.16  --   --   --   --   --  1.15  --   HEPARINUNFRC  --   --   --  1.18*  --   --  0.13*  --  0.29*  CREATININE 0.52  --   --   --   --   --   --  0.55  --   TROPONINI  --   --   < >  --  0.06* 0.06*  --  0.07*  --   < > = values in this interval not displayed.  Estimated Creatinine Clearance: 80.9 mL/min (by C-G formula based on SCr of 0.55 mg/dL).  Assessment: 45 YOF on heparin (post-cath which showed CAD) while awaiting CABG (planned for Friday). Heparin level now very slightly subtherapeutic (0.29) on rate increase to 900 units/hr. CBC stable. No issues with line or bleeding reported per RN.  Goal of Therapy:  Heparin level 0.3-0.7 units/ml Monitor platelets by anticoagulation protocol: Yes   Plan:  - Increase heparin gtt to 1050 units/hr - 6h heparin level - Daily heparin  level/CBC - Monitor for s/sx bleeding - CABG for 1/12   Babs BertinHaley Evamarie Raetz, PharmD, BCPS Clinical Pharmacist 08/08/2016 10:39 AM

## 2016-08-08 NOTE — Progress Notes (Signed)
Inpatient Diabetes Program Recommendations  AACE/ADA: New Consensus Statement on Inpatient Glycemic Control (2015)  Target Ranges:  Prepandial:   less than 140 mg/dL      Peak postprandial:   less than 180 mg/dL (1-2 hours)      Critically ill patients:  140 - 180 mg/dL   Lab Results  Component Value Date   GLUCAP 147 (H) 08/08/2016   HGBA1C 13.3 (H) 08/07/2016    Review of Glycemic Control Results for [name], [name] (MRN [id number]) as of 08/08/2016 11:43  Ref. Range 08/07/2016 04:08 08/07/2016 12:49 08/07/2016 17:25 08/07/2016 20:41 08/08/2016 06:43  Glucose-Capillary Latest Ref Range: 65 - 99 mg/dL 119102 (H) 147127 (H) 829264 (H) 398 (H) 147 (H)   Diabetes history: DM2 Outpatient Diabetes medications: 70/30 75 units am + 25 units pm + Humalog 5-20 units tid Current orders for Inpatient glycemic control: 70/30 30 units bid + Novolog correction 0-15 units tid + 0-5 units hs  Inpatient Diabetes Program Recommendations:  Attempted to speak to patient but patient states "I'm not feeling well today". Will follow patient while in the hospital. Discussed educational needs with RN.  Thank you, Billy FischerJudy E. Danette Weinfeld, RN, MSN, CDE Inpatient Glycemic Control Team Team Pager [phone] (8am-5pm) 08/08/2016 11:51 AM

## 2016-08-08 NOTE — Anesthesia Preprocedure Evaluation (Addendum)
Anesthesia Evaluation  Patient identified by MRN, date of birth, ID band Patient awake    Reviewed: Allergy & Precautions, H&P , NPO status , Patient's Chart, lab work & pertinent test results  History of Anesthesia Complications Negative for: history of anesthetic complications  Airway Mallampati: II  TM Distance: >3 FB Neck ROM: Full    Dental  (+) Teeth Intact   Pulmonary Current Smoker,    breath sounds clear to auscultation       Cardiovascular Exercise Tolerance: Good hypertension, Pt. on medications + angina + CAD and + Past MI   Rhythm:Regular     Neuro/Psych  Headaches, negative neurological ROS  negative psych ROS   GI/Hepatic negative GI ROS, Neg liver ROS,   Endo/Other  diabetes, Insulin DependentMorbid obesity  Renal/GU negative Renal ROS  negative genitourinary   Musculoskeletal   Abdominal   Peds  Hematology negative hematology ROS (+)   Anesthesia Other Findings   Reproductive/Obstetrics negative OB ROS                            Anesthesia Physical Anesthesia Plan  ASA: IV  Anesthesia Plan: General   Post-op Pain Management:    Induction: Intravenous  Airway Management Planned: Oral ETT  Additional Equipment: Arterial line, CVP, PA Cath, TEE and Ultrasound Guidance Line Placement  Intra-op Plan: Utilization Of Total Body Hypothermia per surgeon request  Post-operative Plan: Post-operative intubation/ventilation  Informed Consent: I have reviewed the patients History and Physical, chart, labs and discussed the procedure including the risks, benefits and alternatives for the proposed anesthesia with the patient or authorized representative who has indicated his/her understanding and acceptance.   Dental advisory given  Plan Discussed with: CRNA and Surgeon  Anesthesia Plan Comments:        Anesthesia Quick Evaluation

## 2016-08-08 NOTE — Progress Notes (Signed)
Patient ID: [name], female   DOB: 15-May-1971, 46 y.o.   MRN: [id number]      [address]       [place] [address]             [phone]                 1 Day Post-Op Procedure(s) (LRB): Left Heart Cath and Coronary Angiography (N/A) Intravascular Pressure Wire/FFR Study (N/A)  LOS: 1 day   Subjective: Some nausea today , abdomen soft, no vomiting   Objective: Vital signs in last 24 hours: Patient Vitals for the past 24 hrs:  BP Temp Temp src Pulse Resp SpO2  08/08/16 1601 (!) 189/101 99.4 F (37.4 C) Oral 96 18 100 %  08/08/16 1500 (!) 180/92 - - - (!) 21 -  08/08/16 1415 (!) 181/81 - - - - -  08/08/16 1300 (!) 186/79 - - 76 12 100 %  08/08/16 1205 (!) 163/111 97.8 F (36.6 C) Oral 82 17 100 %  08/08/16 1100 (!) 160/88 - - 75 (!) 24 98 %  08/08/16 1000 (!) 164/91 - - 80 (!) 21 99 %  08/08/16 0804 (!) 166/79 98.6 F (37 C) Oral 87 16 100 %  08/08/16 0700 135/88 - - 96 - 100 %  08/08/16 0600 (!) 154/77 - - 85 - 100 %  08/08/16 0500 (!) 151/83 - - 84 - 100 %  08/08/16 0400 (!) 165/83 - - 86 - 100 %  08/08/16 0300 (!) 150/82 - - 88 - 99 %  08/08/16 0200 (!) 147/82 - - 84 (!) 24 100 %  08/08/16 0100 139/76 - - 81 - 99 %  08/07/16 2300 (!) 143/67 - - 81 20 98 %  08/07/16 2200 (!) 169/98 - - 86 (!) 26 100 %  08/07/16 2100 (!) 163/78 - - 85 18 100 %  08/07/16 2030 (!) 169/84 - - 87 15 100 %  08/07/16 2025 (!) 196/92 - - 84 17 100 %  08/07/16 2017 (!) 180/72 - Oral 96 14 100 %  08/07/16 2000 (!) 219/103 - - 86 13 100 %  08/07/16 1930 (!) 183/72 - - 92 (!) 25 98 %  08/07/16 1900 (!) 188/84 - - 85 (!) 23 99 %  08/07/16 1830 (!) 213/102 - - 84 (!) 24 99 %    Filed Weights   08/07/16 0139  Weight: 172 lb (78 kg)    Hemodynamic parameters for last 24 hours:    Intake/Output from previous day: 01/10 0701 - 01/11 0700 In: 895.2 [P.O.:720; I.V.:175.2] Out: 1950 [Urine:1950] Intake/Output this shift: Total I/O In: 581 [P.O.:360;  I.V.:221] Out: 200 [Urine:200]  Scheduled Meds: . aspirin  81 mg Oral Daily  . atorvastatin  40 mg Oral q1800  . [START ON 08/09/2016] chlorhexidine  15 mL Mouth/Throat Once  . Chlorhexidine Gluconate Cloth  6 each Topical Once   And  . [START ON 08/09/2016] Chlorhexidine Gluconate Cloth  6 each Topical Once  . insulin aspart  0-15 Units Subcutaneous TID WC  . insulin aspart  0-5 Units Subcutaneous QHS  . insulin aspart protamine- aspart  30 Units Subcutaneous BID WC  . lisinopril  20 mg Oral Daily  . metoprolol tartrate  25 mg Oral BID  . sodium chloride flush  3 mL Intravenous Q12H  . triamcinolone  1 spray Each Nare Daily   Continuous Infusions: . heparin 1,050 Units/hr (08/08/16 1100)   PRN Meds:.sodium chloride, acetaminophen,  ALPRAZolam, fentaNYL (SUBLIMAZE) injection, hydrALAZINE, magnesium hydroxide, nitroGLYCERIN, ondansetron (ZOFRAN) IV, sodium chloride flush, sodium phosphate, zolpidem  General appearance: alert and cooperative Neurologic: intact Heart: regular rate and rhythm, S1, S2 normal, no murmur, click, rub or gallop Lungs: clear to auscultation bilaterally Abdomen: soft, non-tender; bowel sounds normal; no masses,  no organomegaly Extremities: extremities normal, atraumatic, no cyanosis or edema and Homans sign is negative, no sign of DVT Wound: radiacl cath site ok  Lab Results: CBC: Recent Labs  08/07/16 0237 08/08/16 0304  WBC 10.8* 9.7  HGB 13.5 12.0  HCT 38.2 35.1*  PLT 269 223   BMET:  Recent Labs  08/07/16 0237 08/08/16 0304  NA 137 137  K 3.1* 3.6  CL 109 109  CO2 21* 22  GLUCOSE 107* 168*  BUN 9 8  CREATININE 0.52 0.55  CALCIUM 8.9 8.5*    PT/INR:  Recent Labs  08/08/16 0304  LABPROT 14.8  INR 1.15     Radiology Dg Chest 2 View  Result Date: 08/07/2016 CLINICAL DATA:  Shortness of breath with pain across the top of the chest tonight. EXAM: CHEST  2 VIEW COMPARISON:  05/28/2014 FINDINGS: Normal heart size and pulmonary  vascularity. No focal airspace disease or consolidation in the lungs. No blunting of costophrenic angles. No pneumothorax. Mediastinal contours appear intact. Mild degenerative changes in the spine. Surgical clips in the right upper quadrant. IMPRESSION: No active cardiopulmonary disease. Electronically Signed   By: Burman NievesWilliam  Stevens M.D.   On: 08/07/2016 02:18     Assessment/Plan: S/P Procedure(s) (LRB): Left Heart Cath and Coronary Angiography (N/A) Intravascular Pressure Wire/FFR Study (N/A)  Plan cabg, again reviewed plans with patient and husband. The goals risks and alternatives of the planned surgical procedure cabg  have been discussed with the patient in detail. The risks of the procedure including death, infection, stroke, myocardial infarction, bleeding, blood transfusion have all been discussed specifically.  I have quoted Mirjana Cherry-[name] a 3% of perioperative mortality and a complication rate as high as 40 %. The patient's questions have been answered.Shery Cherry-[name] is willing  to proceed with the planned procedure.   Delight OvensEdward B Thalia Turkington MD 08/08/2016 6:18 PM

## 2016-08-08 NOTE — Progress Notes (Signed)
ANTICOAGULATION CONSULT NOTE - FOLLOW UP  Pharmacy Consult:  Heparin Indication: CAD - plan for CABG on Friday  Allergies  Allergen Reactions  . Eugenol Hives and Other (See Comments)    Burns mouth  . Hydrochlorothiazide Anaphylaxis  . Morphine And Related Anaphylaxis  . Percocet [Oxycodone-Acetaminophen] Anaphylaxis and Nausea And Vomiting  . Shellfish Allergy Anaphylaxis  . Sulfa Antibiotics Anaphylaxis  . Tomato Anaphylaxis    Patient Measurements: Height: 4\' 11"  (149.9 cm) Weight: 172 lb (78 kg) IBW/kg (Calculated) : 43.2 Heparin Dosing Weight: 61 kg  Vital Signs: Temp Source: Oral (01/10 2017) BP: 143/67 (01/10 2300) Pulse Rate: 81 (01/10 2300)  Labs:  Recent Labs  08/07/16 0237 08/07/16 0620 08/07/16 0621 08/07/16 1355 08/07/16 1538 08/07/16 2053 08/08/16 0028  HGB 13.5  --   --   --   --   --   --   HCT 38.2  --   --   --   --   --   --   PLT 269  --   --   --   --   --   --   APTT  --  26  --   --   --   --   --   LABPROT  --  14.8  --   --   --   --   --   INR  --  1.16  --   --   --   --   --   HEPARINUNFRC  --   --   --  1.18*  --   --  0.13*  CREATININE 0.52  --   --   --   --   --   --   TROPONINI  --   --  0.10*  --  0.06* 0.06*  --     Estimated Creatinine Clearance: 80.9 mL/min (by C-G formula based on SCr of 0.52 mg/dL).  Assessment: 45 YOF on heparin (post cath which showed CAD) while awaiting CABG (planned for Friday). Heparin level subtherapeutic (0.13) on gtt at 700 units/hr. No issues with line or bleeding reported per RN.  Goal of Therapy:  Heparin level 0.3-0.7 units/ml Monitor platelets by anticoagulation protocol: Yes   Plan:  - Increase heparin gtt to 900 units/hr - Check 6 hr heparin level  Christoper Fabianaron Jani Ploeger, PharmD, BCPS Clinical pharmacist, pager [phone] 08/08/2016, 1:18 AM

## 2016-08-08 NOTE — Progress Notes (Signed)
ANTICOAGULATION CONSULT NOTE - FOLLOW UP    HL = 0.43 (goal 0.3 - 0.7 units/mL) Heparin dosing weight = 61 kg   Assessment: 45 YOF s/p cath to continue on IV heparin until CABG tomorrow.  Heparin level is therapeutic; no bleeding reported.   Plan: - Continue heparin gtt at 1050 units/hr - F/U AM labs vs post CABG    Viaan Knippenberg D. Laney Potashang, PharmD, BCPS 08/08/2016, 5:24 PM

## 2016-08-08 NOTE — Progress Notes (Addendum)
Patient had fleet enema and good results. Reported large bowel movement and feels much better now.  Nitroglycerin infusion discontinued at 1030, headache has improved since then.  Able to rest comfortable in bed at this time.

## 2016-08-08 NOTE — Progress Notes (Signed)
Echocardiogram 2D Echocardiogram has been performed.  [name] 08/08/2016, 3:24 PM

## 2016-08-09 ENCOUNTER — Inpatient Hospital Stay (HOSPITAL_COMMUNITY): Payer: BLUE CROSS/BLUE SHIELD

## 2016-08-09 ENCOUNTER — Inpatient Hospital Stay (HOSPITAL_COMMUNITY): Payer: BLUE CROSS/BLUE SHIELD | Admitting: Certified Registered Nurse Anesthetist

## 2016-08-09 ENCOUNTER — Encounter (HOSPITAL_COMMUNITY)
Admission: EM | Disposition: A | Discharge status: Rehabilitation Facility | Payer: Self-pay | Source: Home / Self Care | Attending: Cardiothoracic Surgery

## 2016-08-09 ENCOUNTER — Encounter (HOSPITAL_COMMUNITY): Payer: Self-pay | Admitting: Certified Registered Nurse Anesthetist

## 2016-08-09 DIAGNOSIS — Z951 Presence of aortocoronary bypass graft: Secondary | ICD-10-CM

## 2016-08-09 HISTORY — PX: CORONARY ARTERY BYPASS GRAFT: SHX141

## 2016-08-09 HISTORY — PX: TEE WITHOUT CARDIOVERSION: SHX5443

## 2016-08-09 LAB — PROTIME-INR
INR: 1.53
Prothrombin Time: 18.5 seconds — ABNORMAL HIGH (ref 11.4–15.2)

## 2016-08-09 LAB — GLUCOSE, CAPILLARY
GLUCOSE-CAPILLARY: 100 mg/dL — AB (ref 65–99)
GLUCOSE-CAPILLARY: 124 mg/dL — AB (ref 65–99)
GLUCOSE-CAPILLARY: 131 mg/dL — AB (ref 65–99)
GLUCOSE-CAPILLARY: 167 mg/dL — AB (ref 65–99)
GLUCOSE-CAPILLARY: 207 mg/dL — AB (ref 65–99)
GLUCOSE-CAPILLARY: 98 mg/dL (ref 65–99)
Glucose-Capillary: 95 mg/dL (ref 65–99)
Glucose-Capillary: 108 mg/dL — ABNORMAL HIGH (ref 65–99)
Glucose-Capillary: 141 mg/dL — ABNORMAL HIGH (ref 65–99)
Glucose-Capillary: 113 mg/dL — ABNORMAL HIGH (ref 65–99)
Glucose-Capillary: 99 mg/dL (ref 65–99)

## 2016-08-09 LAB — POCT I-STAT, CHEM 8
BUN: 10 mg/dL (ref 6–20)
BUN: 8 mg/dL (ref 6–20)
BUN: 9 mg/dL (ref 6–20)
BUN: 9 mg/dL (ref 6–20)
BUN: 9 mg/dL (ref 6–20)
BUN: 9 mg/dL (ref 6–20)
CALCIUM ION: 1.1 mmol/L — AB (ref 1.15–1.40)
CHLORIDE: 105 mmol/L (ref 101–111)
CHLORIDE: 100 mmol/L — AB (ref 101–111)
CHLORIDE: 102 mmol/L (ref 101–111)
CHLORIDE: 103 mmol/L (ref 101–111)
CREATININE: 0.5 mg/dL (ref 0.44–1.00)
CREATININE: 0.4 mg/dL — AB (ref 0.44–1.00)
CREATININE: 0.4 mg/dL — AB (ref 0.44–1.00)
CREATININE: 0.5 mg/dL (ref 0.44–1.00)
Calcium, Ion: 1.29 mmol/L (ref 1.15–1.40)
Calcium, Ion: 0.99 mmol/L — ABNORMAL LOW (ref 1.15–1.40)
Calcium, Ion: 1.24 mmol/L (ref 1.15–1.40)
Calcium, Ion: 1.07 mmol/L — ABNORMAL LOW (ref 1.15–1.40)
Calcium, Ion: 1.06 mmol/L — ABNORMAL LOW (ref 1.15–1.40)
Chloride: 106 mmol/L (ref 101–111)
Chloride: 106 mmol/L (ref 101–111)
Creatinine, Ser: 0.4 mg/dL — ABNORMAL LOW (ref 0.44–1.00)
Creatinine, Ser: 0.5 mg/dL (ref 0.44–1.00)
GLUCOSE: 107 mg/dL — AB (ref 65–99)
GLUCOSE: 128 mg/dL — AB (ref 65–99)
GLUCOSE: 138 mg/dL — AB (ref 65–99)
GLUCOSE: 145 mg/dL — AB (ref 65–99)
Glucose, Bld: 138 mg/dL — ABNORMAL HIGH (ref 65–99)
Glucose, Bld: 204 mg/dL — ABNORMAL HIGH (ref 65–99)
HCT: 25 % — ABNORMAL LOW (ref 36.0–46.0)
HCT: 26 % — ABNORMAL LOW (ref 36.0–46.0)
HCT: 24 % — ABNORMAL LOW (ref 36.0–46.0)
HCT: 36 % (ref 36.0–46.0)
HEMATOCRIT: 35 % — AB (ref 36.0–46.0)
HEMATOCRIT: 31 % — AB (ref 36.0–46.0)
HEMOGLOBIN: 10.5 g/dL — AB (ref 12.0–15.0)
Hemoglobin: 11.9 g/dL — ABNORMAL LOW (ref 12.0–15.0)
Hemoglobin: 8.5 g/dL — ABNORMAL LOW (ref 12.0–15.0)
Hemoglobin: 8.8 g/dL — ABNORMAL LOW (ref 12.0–15.0)
Hemoglobin: 8.2 g/dL — ABNORMAL LOW (ref 12.0–15.0)
Hemoglobin: 12.2 g/dL (ref 12.0–15.0)
POTASSIUM: 3.4 mmol/L — AB (ref 3.5–5.1)
POTASSIUM: 3.5 mmol/L (ref 3.5–5.1)
Potassium: 3.6 mmol/L (ref 3.5–5.1)
Potassium: 3.7 mmol/L (ref 3.5–5.1)
Potassium: 3.3 mmol/L — ABNORMAL LOW (ref 3.5–5.1)
Potassium: 4.2 mmol/L (ref 3.5–5.1)
SODIUM: 140 mmol/L (ref 135–145)
SODIUM: 140 mmol/L (ref 135–145)
Sodium: 139 mmol/L (ref 135–145)
Sodium: 140 mmol/L (ref 135–145)
Sodium: 139 mmol/L (ref 135–145)
Sodium: 140 mmol/L (ref 135–145)
TCO2: 24 mmol/L (ref 0–100)
TCO2: 24 mmol/L (ref 0–100)
TCO2: 26 mmol/L (ref 0–100)
TCO2: 24 mmol/L (ref 0–100)
TCO2: 25 mmol/L (ref 0–100)
TCO2: 23 mmol/L (ref 0–100)

## 2016-08-09 LAB — POCT I-STAT 3, ART BLOOD GAS (G3+)
ACID-BASE DEFICIT: 2 mmol/L (ref 0.0–2.0)
Acid-base deficit: 2 mmol/L (ref 0.0–2.0)
Acid-base deficit: 6 mmol/L — ABNORMAL HIGH (ref 0.0–2.0)
Bicarbonate: 25.1 mmol/L (ref 20.0–28.0)
Bicarbonate: 23.7 mmol/L (ref 20.0–28.0)
Bicarbonate: 19.4 mmol/L — ABNORMAL LOW (ref 20.0–28.0)
Bicarbonate: 22.5 mmol/L (ref 20.0–28.0)
O2 SAT: 100 %
O2 SAT: 98 %
O2 SAT: 99 %
O2 Saturation: 97 %
PCO2 ART: 46.2 mmHg (ref 32.0–48.0)
PCO2 ART: 38.1 mmHg (ref 32.0–48.0)
PH ART: 7.39 (ref 7.350–7.450)
PH ART: 7.337 — AB (ref 7.350–7.450)
PO2 ART: 108 mmHg (ref 83.0–108.0)
TCO2: 26 mmol/L (ref 0–100)
TCO2: 25 mmol/L (ref 0–100)
TCO2: 21 mmol/L (ref 0–100)
TCO2: 24 mmol/L (ref 0–100)
pCO2 arterial: 41.4 mmHg (ref 32.0–48.0)
pCO2 arterial: 36.2 mmHg (ref 32.0–48.0)
pH, Arterial: 7.318 — ABNORMAL LOW (ref 7.350–7.450)
pH, Arterial: 7.379 (ref 7.350–7.450)
pO2, Arterial: 311 mmHg — ABNORMAL HIGH (ref 83.0–108.0)
pO2, Arterial: 100 mmHg (ref 83.0–108.0)
pO2, Arterial: 119 mmHg — ABNORMAL HIGH (ref 83.0–108.0)

## 2016-08-09 LAB — HEMOGLOBIN AND HEMATOCRIT, BLOOD
HCT: 25.6 % — ABNORMAL LOW (ref 36.0–46.0)
Hemoglobin: 8.8 g/dL — ABNORMAL LOW (ref 12.0–15.0)

## 2016-08-09 LAB — ECHO TEE
AO mean calculated velocity dopler: 103 cm/s
AV pk vel: 153 cm/s
AVG: 5 mmHg
AVPG: 9 mmHg
LVOT area: 3.8 cm2
LVOTD: 22 mm
VTI: 26.8 cm

## 2016-08-09 LAB — CBC
HCT: 37.8 % (ref 36.0–46.0)
HCT: 30.7 % — ABNORMAL LOW (ref 36.0–46.0)
HCT: 29.4 % — ABNORMAL LOW (ref 36.0–46.0)
Hemoglobin: 13.1 g/dL (ref 12.0–15.0)
Hemoglobin: 10.6 g/dL — ABNORMAL LOW (ref 12.0–15.0)
Hemoglobin: 10.3 g/dL — ABNORMAL LOW (ref 12.0–15.0)
MCH: 30.1 pg (ref 26.0–34.0)
MCH: 30.1 pg (ref 26.0–34.0)
MCH: 30.6 pg (ref 26.0–34.0)
MCHC: 34.7 g/dL (ref 30.0–36.0)
MCHC: 34.5 g/dL (ref 30.0–36.0)
MCHC: 35 g/dL (ref 30.0–36.0)
MCV: 86.9 fL (ref 78.0–100.0)
MCV: 87.2 fL (ref 78.0–100.0)
MCV: 87.2 fL (ref 78.0–100.0)
PLATELETS: 265 10*3/uL (ref 150–400)
PLATELETS: 132 10*3/uL — AB (ref 150–400)
Platelets: 138 10*3/uL — ABNORMAL LOW (ref 150–400)
RBC: 4.35 MIL/uL (ref 3.87–5.11)
RBC: 3.52 MIL/uL — ABNORMAL LOW (ref 3.87–5.11)
RBC: 3.37 MIL/uL — ABNORMAL LOW (ref 3.87–5.11)
RDW: 14.1 % (ref 11.5–15.5)
RDW: 14.1 % (ref 11.5–15.5)
RDW: 14.2 % (ref 11.5–15.5)
WBC: 11.6 10*3/uL — ABNORMAL HIGH (ref 4.0–10.5)
WBC: 14.5 10*3/uL — AB (ref 4.0–10.5)
WBC: 14.4 10*3/uL — ABNORMAL HIGH (ref 4.0–10.5)

## 2016-08-09 LAB — TYPE AND SCREEN
ABO/RH(D): A POS
Antibody Screen: NEGATIVE

## 2016-08-09 LAB — PLATELET COUNT: Platelets: 175 10*3/uL (ref 150–400)

## 2016-08-09 LAB — POCT I-STAT 4, (NA,K, GLUC, HGB,HCT)
GLUCOSE: 132 mg/dL — AB (ref 65–99)
HCT: 29 % — ABNORMAL LOW (ref 36.0–46.0)
Hemoglobin: 9.9 g/dL — ABNORMAL LOW (ref 12.0–15.0)
Potassium: 3.2 mmol/L — ABNORMAL LOW (ref 3.5–5.1)
Sodium: 142 mmol/L (ref 135–145)

## 2016-08-09 LAB — CREATININE, SERUM
Creatinine, Ser: 0.64 mg/dL (ref 0.44–1.00)
GFR calc Af Amer: >60 mL/min (ref >60)
GFR calc non Af Amer: >60 mL/min (ref >60)

## 2016-08-09 LAB — HEMOGLOBIN A1C
Hgb A1c MFr Bld: 13.1 % — ABNORMAL HIGH (ref 4.8–5.6)
Mean Plasma Glucose: 329 mg/dL

## 2016-08-09 LAB — APTT: aPTT: 31 seconds (ref 24–36)

## 2016-08-09 LAB — MAGNESIUM: Magnesium: 3.1 mg/dL — ABNORMAL HIGH (ref 1.7–2.4)

## 2016-08-09 LAB — HEPARIN LEVEL (UNFRACTIONATED): HEPARIN UNFRACTIONATED: 0.35 [IU]/mL (ref 0.30–0.70)

## 2016-08-09 SURGERY — CORONARY ARTERY BYPASS GRAFTING (CABG)
Anesthesia: General | Site: Chest

## 2016-08-09 MED ORDER — DEXAMETHASONE SODIUM PHOSPHATE 10 MG/ML IJ SOLN
INTRAMUSCULAR | Status: AC
  Filled 2016-08-09: qty 1

## 2016-08-09 MED ORDER — ALBUMIN HUMAN 5 % IV SOLN
INTRAVENOUS | Status: DC | PRN
  Administered 2016-08-09: 12:00:00 via INTRAVENOUS

## 2016-08-09 MED ORDER — PROTAMINE SULFATE 10 MG/ML IV SOLN
INTRAVENOUS | Status: AC
  Filled 2016-08-09: qty 25

## 2016-08-09 MED ORDER — PHENYLEPHRINE HCL 10 MG/ML IJ SOLN
0.0000 ug/min | INTRAVENOUS | Status: DC
  Administered 2016-08-10: 40 ug/min via INTRAVENOUS
  Filled 2016-08-09 (×3): qty 2

## 2016-08-09 MED ORDER — DEXTROSE 5 % IV SOLN
1.5000 g | Freq: Two times a day (BID) | INTRAVENOUS | Status: AC
  Administered 2016-08-09 – 2016-08-10 (×3): 1.5 g via INTRAVENOUS
  Filled 2016-08-09 (×4): qty 1.5

## 2016-08-09 MED ORDER — FAMOTIDINE IN NACL 20-0.9 MG/50ML-% IV SOLN
20.0000 mg | Freq: Two times a day (BID) | INTRAVENOUS | Status: AC
  Administered 2016-08-09: 20 mg via INTRAVENOUS

## 2016-08-09 MED ORDER — LACTATED RINGERS IV SOLN
500.0000 mL | Freq: Once | INTRAVENOUS | Status: DC | PRN
Start: 2016-08-09 — End: 2016-08-16

## 2016-08-09 MED ORDER — DIPHENHYDRAMINE HCL 50 MG/ML IJ SOLN
INTRAMUSCULAR | Status: DC | PRN
  Administered 2016-08-09: 25 mg via INTRAVENOUS

## 2016-08-09 MED ORDER — SODIUM CHLORIDE 0.9 % IV SOLN
INTRAVENOUS | Status: DC | PRN
  Administered 2016-08-09: 1.5 mg/kg/h via INTRAVENOUS

## 2016-08-09 MED ORDER — METOPROLOL TARTRATE 12.5 MG HALF TABLET
12.5000 mg | ORAL_TABLET | Freq: Two times a day (BID) | ORAL | Status: DC
  Administered 2016-08-10 – 2016-08-15 (×11): 12.5 mg via ORAL
  Filled 2016-08-09 (×12): qty 1

## 2016-08-09 MED ORDER — FENTANYL CITRATE (PF) 100 MCG/2ML IJ SOLN
50.0000 ug | INTRAMUSCULAR | Status: DC | PRN
  Administered 2016-08-09: 100 ug via INTRAVENOUS
  Administered 2016-08-10: 50 ug via INTRAVENOUS
  Administered 2016-08-10: 100 ug via INTRAVENOUS
  Filled 2016-08-09 (×3): qty 2

## 2016-08-09 MED ORDER — SODIUM CHLORIDE 0.9 % IJ SOLN
INTRAMUSCULAR | Status: AC
Start: 2016-08-09 — End: 2016-08-09
  Filled 2016-08-09: qty 10

## 2016-08-09 MED ORDER — SODIUM CHLORIDE 0.9% FLUSH: 3.0000 mL | INTRAVENOUS | Status: DC | PRN

## 2016-08-09 MED ORDER — SODIUM CHLORIDE 0.9 % IV SOLN: INTRAVENOUS | Status: DC

## 2016-08-09 MED ORDER — MIDAZOLAM HCL 2 MG/2ML IJ SOLN: 2.0000 mg | INTRAMUSCULAR | Status: DC | PRN

## 2016-08-09 MED ORDER — FENTANYL CITRATE (PF) 250 MCG/5ML IJ SOLN
INTRAMUSCULAR | Status: AC
  Filled 2016-08-09: qty 5

## 2016-08-09 MED ORDER — ROCURONIUM BROMIDE 100 MG/10ML IV SOLN
INTRAVENOUS | Status: DC | PRN
  Administered 2016-08-09 (×3): 50 mg via INTRAVENOUS

## 2016-08-09 MED ORDER — LIDOCAINE 2% (20 MG/ML) 5 ML SYRINGE
INTRAMUSCULAR | Status: AC
  Filled 2016-08-09: qty 5

## 2016-08-09 MED ORDER — METOPROLOL TARTRATE 5 MG/5ML IV SOLN: 2.5000 mg | INTRAVENOUS | Status: DC | PRN

## 2016-08-09 MED ORDER — ONDANSETRON HCL 4 MG/2ML IJ SOLN
4.0000 mg | Freq: Four times a day (QID) | INTRAMUSCULAR | Status: DC | PRN
  Administered 2016-08-09: 4 mg via INTRAVENOUS
  Filled 2016-08-09: qty 2

## 2016-08-09 MED ORDER — HEPARIN SODIUM (PORCINE) 1000 UNIT/ML IJ SOLN
INTRAMUSCULAR | Status: AC
  Filled 2016-08-09: qty 1

## 2016-08-09 MED ORDER — SODIUM CHLORIDE 0.9 % IV SOLN: 250.0000 mL | INTRAVENOUS | Status: DC

## 2016-08-09 MED ORDER — VANCOMYCIN HCL IN DEXTROSE 1-5 GM/200ML-% IV SOLN
1000.0000 mg | Freq: Once | INTRAVENOUS | Status: AC
  Administered 2016-08-09: 1000 mg via INTRAVENOUS
  Filled 2016-08-09: qty 200

## 2016-08-09 MED ORDER — LACTATED RINGERS IV SOLN
INTRAVENOUS | Status: DC | PRN
  Administered 2016-08-09 (×4): via INTRAVENOUS

## 2016-08-09 MED ORDER — ALBUMIN HUMAN 5 % IV SOLN
250.0000 mL | INTRAVENOUS | Status: AC | PRN
  Administered 2016-08-09: 250 mL via INTRAVENOUS

## 2016-08-09 MED ORDER — SODIUM CHLORIDE 0.45 % IV SOLN
INTRAVENOUS | Status: DC | PRN
  Administered 2016-08-09: 14:00:00 via INTRAVENOUS

## 2016-08-09 MED ORDER — SODIUM CHLORIDE 0.9 % IR SOLN
Status: DC | PRN
  Administered 2016-08-09: 6000 mL

## 2016-08-09 MED ORDER — CHLORHEXIDINE GLUCONATE 0.12 % MT SOLN
15.0000 mL | OROMUCOSAL | Status: AC
  Administered 2016-08-09: 15 mL via OROMUCOSAL

## 2016-08-09 MED ORDER — ASPIRIN EC 325 MG PO TBEC
325.0000 mg | DELAYED_RELEASE_TABLET | Freq: Every day | ORAL | Status: DC
  Administered 2016-08-10 – 2016-08-15 (×5): 325 mg via ORAL
  Filled 2016-08-09 (×6): qty 1

## 2016-08-09 MED ORDER — SODIUM BICARBONATE 8.4 % IV SOLN
50.0000 meq | Freq: Once | INTRAVENOUS | Status: AC
  Administered 2016-08-09: 50 meq via INTRAVENOUS

## 2016-08-09 MED ORDER — METOCLOPRAMIDE HCL 5 MG/ML IJ SOLN
10.0000 mg | Freq: Four times a day (QID) | INTRAMUSCULAR | Status: AC
  Administered 2016-08-09 – 2016-08-14 (×19): 10 mg via INTRAVENOUS
  Filled 2016-08-09 (×18): qty 2

## 2016-08-09 MED ORDER — NITROGLYCERIN 0.2 MG/ML ON CALL CATH LAB
INTRAVENOUS | Status: DC | PRN
  Administered 2016-08-09: 40 ug via INTRAVENOUS

## 2016-08-09 MED ORDER — BISACODYL 10 MG RE SUPP: 10.0000 mg | Freq: Every day | RECTAL | Status: DC

## 2016-08-09 MED ORDER — SODIUM CHLORIDE 0.9 % IV SOLN
INTRAVENOUS | Status: DC | PRN
  Administered 2016-08-09: 09:00:00 via INTRAVENOUS

## 2016-08-09 MED ORDER — LIDOCAINE HCL (CARDIAC) 20 MG/ML IV SOLN
INTRAVENOUS | Status: DC | PRN
  Administered 2016-08-09: 40 mg via INTRAVENOUS

## 2016-08-09 MED ORDER — ROCURONIUM BROMIDE 50 MG/5ML IV SOSY
PREFILLED_SYRINGE | INTRAVENOUS | Status: AC
Start: 2016-08-09 — End: 2016-08-09
  Filled 2016-08-09: qty 5

## 2016-08-09 MED ORDER — SODIUM CHLORIDE 0.9 % IJ SOLN
INTRAMUSCULAR | Status: DC | PRN
  Administered 2016-08-09 (×3): 4 mL via TOPICAL

## 2016-08-09 MED ORDER — MIDAZOLAM HCL 10 MG/2ML IJ SOLN
INTRAMUSCULAR | Status: AC
  Filled 2016-08-09: qty 2

## 2016-08-09 MED ORDER — NITROGLYCERIN IN D5W 200-5 MCG/ML-% IV SOLN
0.0000 ug/min | INTRAVENOUS | Status: DC
  Administered 2016-08-09: 10 ug/min via INTRAVENOUS

## 2016-08-09 MED ORDER — LACTATED RINGERS IV SOLN: INTRAVENOUS | Status: DC

## 2016-08-09 MED ORDER — PANTOPRAZOLE SODIUM 40 MG PO TBEC
40.0000 mg | DELAYED_RELEASE_TABLET | Freq: Every day | ORAL | Status: DC
  Administered 2016-08-11 – 2016-08-15 (×5): 40 mg via ORAL
  Filled 2016-08-09 (×5): qty 1

## 2016-08-09 MED ORDER — FENTANYL CITRATE (PF) 100 MCG/2ML IJ SOLN
INTRAMUSCULAR | Status: DC | PRN
  Administered 2016-08-09: 50 ug via INTRAVENOUS
  Administered 2016-08-09: 150 ug via INTRAVENOUS
  Administered 2016-08-09 (×2): 100 ug via INTRAVENOUS
  Administered 2016-08-09: 50 ug via INTRAVENOUS
  Administered 2016-08-09: 150 ug via INTRAVENOUS
  Administered 2016-08-09: 200 ug via INTRAVENOUS
  Administered 2016-08-09: 100 ug via INTRAVENOUS
  Administered 2016-08-09: 50 ug via INTRAVENOUS
  Administered 2016-08-09: 100 ug via INTRAVENOUS
  Administered 2016-08-09: 250 ug via INTRAVENOUS
  Administered 2016-08-09: 100 ug via INTRAVENOUS
  Administered 2016-08-09: 250 ug via INTRAVENOUS
  Administered 2016-08-09: 100 ug via INTRAVENOUS

## 2016-08-09 MED ORDER — NITROPRUSSIDE SODIUM 25 MG/ML IV SOLN
0.0000 ug/kg/min | INTRAVENOUS | Status: DC
  Filled 2016-08-09: qty 2

## 2016-08-09 MED ORDER — HEPARIN SODIUM (PORCINE) 1000 UNIT/ML IJ SOLN
INTRAMUSCULAR | Status: DC | PRN
  Administered 2016-08-09: 21000 [IU] via INTRAVENOUS

## 2016-08-09 MED ORDER — VECURONIUM BROMIDE 10 MG IV SOLR
INTRAVENOUS | Status: DC | PRN
  Administered 2016-08-09 (×2): 5 mg via INTRAVENOUS

## 2016-08-09 MED ORDER — DIPHENHYDRAMINE HCL 50 MG/ML IJ SOLN
INTRAMUSCULAR | Status: AC
  Filled 2016-08-09: qty 1

## 2016-08-09 MED ORDER — PROPOFOL 10 MG/ML IV BOLUS
INTRAVENOUS | Status: DC | PRN
  Administered 2016-08-09: 30 mg via INTRAVENOUS
  Administered 2016-08-09 (×2): 50 mg via INTRAVENOUS

## 2016-08-09 MED ORDER — FENTANYL CITRATE (PF) 250 MCG/5ML IJ SOLN
INTRAMUSCULAR | Status: AC
  Filled 2016-08-09: qty 10

## 2016-08-09 MED ORDER — CHLORHEXIDINE GLUCONATE 0.12% ORAL RINSE (MEDLINE KIT): 15.0000 mL | Freq: Two times a day (BID) | OROMUCOSAL | Status: DC

## 2016-08-09 MED ORDER — SODIUM CHLORIDE 0.9 % IV SOLN
INTRAVENOUS | Status: DC
  Administered 2016-08-09: 2.2 [IU]/h via INTRAVENOUS
  Administered 2016-08-09: 3.2 [IU]/h via INTRAVENOUS
  Administered 2016-08-09: 1.9 [IU]/h via INTRAVENOUS
  Filled 2016-08-09 (×3): qty 2.5

## 2016-08-09 MED ORDER — NITROGLYCERIN IN D5W 200-5 MCG/ML-% IV SOLN
INTRAVENOUS | Status: DC | PRN
  Administered 2016-08-09: 5 ug/min via INTRAVENOUS

## 2016-08-09 MED ORDER — ATORVASTATIN CALCIUM 40 MG PO TABS
40.0000 mg | ORAL_TABLET | Freq: Every day | ORAL | Status: DC
  Administered 2016-08-10 – 2016-08-12 (×3): 40 mg via ORAL
  Filled 2016-08-09 (×3): qty 1

## 2016-08-09 MED ORDER — METOPROLOL TARTRATE 25 MG/10 ML ORAL SUSPENSION
12.5000 mg | Freq: Two times a day (BID) | ORAL | Status: DC
  Administered 2016-08-09: 12.5 mg
  Filled 2016-08-09: qty 5

## 2016-08-09 MED ORDER — MIDAZOLAM HCL 5 MG/5ML IJ SOLN
INTRAMUSCULAR | Status: DC | PRN
  Administered 2016-08-09 (×3): 2 mg via INTRAVENOUS
  Administered 2016-08-09: 4 mg via INTRAVENOUS

## 2016-08-09 MED ORDER — PHENYLEPHRINE HCL 10 MG/ML IJ SOLN
INTRAVENOUS | Status: DC | PRN
  Administered 2016-08-09: 40 ug/min via INTRAVENOUS

## 2016-08-09 MED ORDER — SODIUM CHLORIDE 0.9% FLUSH
3.0000 mL | Freq: Two times a day (BID) | INTRAVENOUS | Status: DC
  Administered 2016-08-10 – 2016-08-15 (×9): 3 mL via INTRAVENOUS

## 2016-08-09 MED ORDER — MAGNESIUM SULFATE 4 GM/100ML IV SOLN
4.0000 g | Freq: Once | INTRAVENOUS | Status: AC
  Administered 2016-08-09: 4 g via INTRAVENOUS
  Filled 2016-08-09: qty 100

## 2016-08-09 MED ORDER — INSULIN REGULAR BOLUS VIA INFUSION
0.0000 [IU] | Freq: Three times a day (TID) | INTRAVENOUS | Status: DC
  Filled 2016-08-09: qty 10

## 2016-08-09 MED ORDER — ASPIRIN 81 MG PO CHEW
324.0000 mg | CHEWABLE_TABLET | Freq: Every day | ORAL | Status: DC
  Administered 2016-08-12: 324 mg
  Filled 2016-08-09 (×2): qty 4

## 2016-08-09 MED ORDER — FENTANYL CITRATE (PF) 250 MCG/5ML IJ SOLN
INTRAMUSCULAR | Status: AC
  Filled 2016-08-09: qty 25

## 2016-08-09 MED ORDER — PROPOFOL 10 MG/ML IV BOLUS
INTRAVENOUS | Status: AC
Start: 2016-08-09 — End: 2016-08-09
  Filled 2016-08-09: qty 20

## 2016-08-09 MED ORDER — DOPAMINE-DEXTROSE 3.2-5 MG/ML-% IV SOLN
3.0000 ug/kg/min | INTRAVENOUS | Status: DC
  Administered 2016-08-09: 3 ug/kg/min via INTRAVENOUS
  Filled 2016-08-09: qty 250

## 2016-08-09 MED ORDER — LACTATED RINGERS IV SOLN
INTRAVENOUS | Status: DC
  Administered 2016-08-09: 14:00:00 via INTRAVENOUS

## 2016-08-09 MED ORDER — ORAL CARE MOUTH RINSE
15.0000 mL | Freq: Four times a day (QID) | OROMUCOSAL | Status: DC
  Administered 2016-08-09 – 2016-08-10 (×3): 15 mL via OROMUCOSAL

## 2016-08-09 MED ORDER — SODIUM CHLORIDE 0.9 % IV SOLN
30.0000 meq | Freq: Once | INTRAVENOUS | Status: AC
  Administered 2016-08-09: 30 meq via INTRAVENOUS
  Filled 2016-08-09: qty 15

## 2016-08-09 MED ORDER — BISACODYL 5 MG PO TBEC
10.0000 mg | DELAYED_RELEASE_TABLET | Freq: Every day | ORAL | Status: DC
  Administered 2016-08-10 – 2016-08-14 (×4): 10 mg via ORAL
  Filled 2016-08-09 (×4): qty 2

## 2016-08-09 MED ORDER — DOCUSATE SODIUM 100 MG PO CAPS
200.0000 mg | ORAL_CAPSULE | Freq: Every day | ORAL | Status: DC
  Administered 2016-08-10 – 2016-08-14 (×4): 200 mg via ORAL
  Filled 2016-08-09 (×4): qty 2

## 2016-08-09 MED ORDER — DEXMEDETOMIDINE HCL IN NACL 200 MCG/50ML IV SOLN
0.0000 ug/kg/h | INTRAVENOUS | Status: DC
  Administered 2016-08-09: 0.7 ug/kg/h via INTRAVENOUS
  Filled 2016-08-09: qty 50

## 2016-08-09 MED ORDER — PROTAMINE SULFATE 10 MG/ML IV SOLN
INTRAVENOUS | Status: DC | PRN
  Administered 2016-08-09 (×4): 40 mg via INTRAVENOUS
  Administered 2016-08-09 (×3): 20 mg via INTRAVENOUS

## 2016-08-09 MED ORDER — HEMOSTATIC AGENTS (NO CHARGE) OPTIME
TOPICAL | Status: DC | PRN
  Administered 2016-08-09 (×2): 1 via TOPICAL

## 2016-08-09 MED ORDER — ALPRAZOLAM 0.25 MG PO TABS: 0.2500 mg | ORAL_TABLET | Freq: Two times a day (BID) | ORAL | Status: DC | PRN

## 2016-08-09 MED FILL — Sodium Chloride IV Soln 0.9%: INTRAVENOUS | Qty: 2000 | Status: AC

## 2016-08-09 MED FILL — Electrolyte-R (PH 7.4) Solution: INTRAVENOUS | Qty: 5000 | Status: AC

## 2016-08-09 MED FILL — Potassium Chloride Inj 2 mEq/ML: INTRAVENOUS | Qty: 40 | Status: AC

## 2016-08-09 MED FILL — Magnesium Sulfate Inj 50%: INTRAMUSCULAR | Qty: 10 | Status: AC

## 2016-08-09 MED FILL — Heparin Sodium (Porcine) Inj 1000 Unit/ML: INTRAMUSCULAR | Qty: 10 | Status: AC

## 2016-08-09 MED FILL — Sodium Bicarbonate IV Soln 8.4%: INTRAVENOUS | Qty: 50 | Status: AC

## 2016-08-09 MED FILL — Lidocaine HCl IV Inj 20 MG/ML: INTRAVENOUS | Qty: 5 | Status: AC

## 2016-08-09 MED FILL — Heparin Sodium (Porcine) Inj 1000 Unit/ML: INTRAMUSCULAR | Qty: 30 | Status: AC

## 2016-08-09 MED FILL — Mannitol IV Soln 20%: INTRAVENOUS | Qty: 500 | Status: AC

## 2016-08-09 SURGICAL SUPPLY — 75 items
BAG DECANTER FOR FLEXI CONT (MISCELLANEOUS) ×3 IMPLANT
BANDAGE ACE 4X5 VEL STRL LF (GAUZE/BANDAGES/DRESSINGS) ×3 IMPLANT
BANDAGE ACE 6X5 VEL STRL LF (GAUZE/BANDAGES/DRESSINGS) ×3 IMPLANT
BATTERY MAXDRIVER (MISCELLANEOUS) ×3 IMPLANT
BLADE STERNUM SYSTEM 6 (BLADE) ×3 IMPLANT
BNDG GAUZE ELAST 4 BULKY (GAUZE/BANDAGES/DRESSINGS) ×3 IMPLANT
CANISTER SUCTION 2500CC (MISCELLANEOUS) ×3 IMPLANT
CATH CPB KIT GERHARDT (MISCELLANEOUS) ×3 IMPLANT
CATH THORACIC 28FR (CATHETERS) ×3 IMPLANT
CONN ST 1/4X3/8  BEN (MISCELLANEOUS) ×1
CONN ST 1/4X3/8 BEN (MISCELLANEOUS) ×2 IMPLANT
CRADLE DONUT ADULT HEAD (MISCELLANEOUS) ×3 IMPLANT
DRAIN CHANNEL 28F RND 3/8 FF (WOUND CARE) ×3 IMPLANT
DRAPE CARDIOVASCULAR INCISE (DRAPES) ×1
DRAPE SLUSH/WARMER DISC (DRAPES) ×3 IMPLANT
DRAPE SRG 135X102X78XABS (DRAPES) ×2 IMPLANT
DRSG AQUACEL AG ADV 3.5X14 (GAUZE/BANDAGES/DRESSINGS) ×3 IMPLANT
ELECT BLADE 4.0 EZ CLEAN MEGAD (MISCELLANEOUS) ×3
ELECT REM PT RETURN 9FT ADLT (ELECTROSURGICAL) ×6
ELECTRODE BLDE 4.0 EZ CLN MEGD (MISCELLANEOUS) ×2 IMPLANT
ELECTRODE REM PT RTRN 9FT ADLT (ELECTROSURGICAL) ×4 IMPLANT
FELT TEFLON 1X6 (MISCELLANEOUS) ×6 IMPLANT
GAUZE SPONGE 4X4 12PLY STRL (GAUZE/BANDAGES/DRESSINGS) ×6 IMPLANT
GLOVE BIO SURGEON STRL SZ 6.5 (GLOVE) ×24 IMPLANT
GLOVE BIOGEL PI IND STRL 6.5 (GLOVE) ×4 IMPLANT
GLOVE BIOGEL PI INDICATOR 6.5 (GLOVE) ×2
GOWN STRL REUS W/ TWL LRG LVL3 (GOWN DISPOSABLE) ×12 IMPLANT
GOWN STRL REUS W/TWL LRG LVL3 (GOWN DISPOSABLE) ×6
HEMOSTAT POWDER SURGIFOAM 1G (HEMOSTASIS) ×9 IMPLANT
HEMOSTAT SURGICEL 2X14 (HEMOSTASIS) ×3 IMPLANT
KIT BASIN OR (CUSTOM PROCEDURE TRAY) ×3 IMPLANT
KIT CATH SUCT 8FR (CATHETERS) ×3 IMPLANT
KIT ROOM TURNOVER OR (KITS) ×3 IMPLANT
KIT SUCTION CATH 14FR (SUCTIONS) ×6 IMPLANT
KIT VASOVIEW HEMOPRO VH 3000 (KITS) ×3 IMPLANT
LEAD PACING MYOCARDI (MISCELLANEOUS) ×6 IMPLANT
MARKER GRAFT CORONARY BYPASS (MISCELLANEOUS) ×9 IMPLANT
NS IRRIG 1000ML POUR BTL (IV SOLUTION) ×18 IMPLANT
PACK OPEN HEART (CUSTOM PROCEDURE TRAY) ×3 IMPLANT
PAD ARMBOARD 7.5X6 YLW CONV (MISCELLANEOUS) ×6 IMPLANT
PAD ELECT DEFIB RADIOL ZOLL (MISCELLANEOUS) ×3 IMPLANT
PENCIL BUTTON HOLSTER BLD 10FT (ELECTRODE) ×3 IMPLANT
PLATE STERNAL 2.3X208 14H 2-PK (Plate) ×3 IMPLANT
PUNCH AORTIC ROTATE  4.5MM 8IN (MISCELLANEOUS) ×3 IMPLANT
SCREW BONE LOCKING 2.3X9 (Screw) ×15 IMPLANT
SCREW LOCKING TI 2.3X11MM (Screw) ×12 IMPLANT
SET CARDIOPLEGIA MPS 5001102 (MISCELLANEOUS) ×3 IMPLANT
SPONGE GAUZE 4X4 12PLY STER LF (GAUZE/BANDAGES/DRESSINGS) ×6 IMPLANT
SPONGE LAP 18X18 X RAY DECT (DISPOSABLE) ×6 IMPLANT
SURGIFLO W/THROMBIN 8M KIT (HEMOSTASIS) ×3 IMPLANT
SUT BONE WAX W31G (SUTURE) ×3 IMPLANT
SUT MNCRL AB 4-0 PS2 18 (SUTURE) ×3 IMPLANT
SUT PROLENE 3 0 SH1 36 (SUTURE) ×3 IMPLANT
SUT PROLENE 4 0 TF (SUTURE) ×9 IMPLANT
SUT PROLENE 5 0 C 1 36 (SUTURE) ×3 IMPLANT
SUT PROLENE 6 0 C 1 30 (SUTURE) ×3 IMPLANT
SUT PROLENE 6 0 CC (SUTURE) ×9 IMPLANT
SUT PROLENE 7 0 BV1 MDA (SUTURE) ×6 IMPLANT
SUT PROLENE 8 0 BV175 6 (SUTURE) ×18 IMPLANT
SUT STEEL 5 V 56 M (SUTURE) ×3 IMPLANT
SUT STEEL 6MS V (SUTURE) ×3 IMPLANT
SUT STEEL SZ 6 DBL 3X14 BALL (SUTURE) ×3 IMPLANT
SUT VIC AB 1 CTX 18 (SUTURE) ×6 IMPLANT
SUT VIC AB 2-0 CT1 27 (SUTURE) ×1
SUT VIC AB 2-0 CT1 TAPERPNT 27 (SUTURE) ×2 IMPLANT
SUTURE E-PAK OPEN HEART (SUTURE) ×3 IMPLANT
SYSTEM SAHARA CHEST DRAIN ATS (WOUND CARE) ×3 IMPLANT
TAPE CLOTH SURG 4X10 WHT LF (GAUZE/BANDAGES/DRESSINGS) ×3 IMPLANT
TAPE PAPER 2X10 WHT MICROPORE (GAUZE/BANDAGES/DRESSINGS) ×3 IMPLANT
TOWEL OR 17X24 6PK STRL BLUE (TOWEL DISPOSABLE) ×6 IMPLANT
TOWEL OR 17X26 10 PK STRL BLUE (TOWEL DISPOSABLE) ×6 IMPLANT
TRAY FOLEY IC TEMP SENS 16FR (CATHETERS) ×3 IMPLANT
TUBING INSUFFLATION (TUBING) ×3 IMPLANT
UNDERPAD 30X30 (UNDERPADS AND DIAPERS) ×3 IMPLANT
WATER STERILE IRR 1000ML POUR (IV SOLUTION) ×6 IMPLANT

## 2016-08-09 NOTE — Anesthesia Procedure Notes (Signed)
Procedure Name: Intubation Date/Time: 08/09/2016 7:41 AM Performed by: Roderic Palau Pre-anesthesia Checklist: Patient identified, Emergency Drugs available, Suction available, Patient being monitored and Timeout performed Patient Re-evaluated:Patient Re-evaluated prior to inductionOxygen Delivery Method: Circle system utilized Preoxygenation: Pre-oxygenation with 100% oxygen Intubation Type: IV induction Ventilation: Mask ventilation without difficulty Laryngoscope Size: Mac and 3 Grade View: Grade I Tube type: Oral Tube size: 8.0 mm Number of attempts: 1 Airway Equipment and Method: Stylet Placement Confirmation: ETT inserted through vocal cords under direct vision,  positive ETCO2,  breath sounds checked- equal and bilateral and CO2 detector Secured at: 21 cm Tube secured with: Tape Dental Injury: Teeth and Oropharynx as per pre-operative assessment

## 2016-08-09 NOTE — Procedures (Signed)
Extubation Procedure Note  Patient Details:   Name: [name] DOB: 04/11/1971 MRN: [id number]   Airway Documentation:  Airway 8 mm (Active)  Secured at (cm) 21 cm 08/09/2016  1:35 PM  Measured From Lips 08/09/2016  1:35 PM  Secured Location Right 08/09/2016  1:35 PM  Secured By Pink Tape 08/09/2016  1:35 PM  Site Condition Dry 08/09/2016  1:35 PM   Positive cuff leak, nif -40, vc .7L. Pt extuabted to 4lpm [place], no distress noted. Incentive spirometer x10 breaths achieved 500-750cc's.  Evaluation  O2 sats: stable throughout Complications: No apparent complications Patient did tolerate procedure well. Bilateral Breath Sounds: Clear   Yes  Keilah Lemire Tobi BastosM Lindy Garczynski 08/09/2016, 6:07 PM

## 2016-08-09 NOTE — Anesthesia Procedure Notes (Signed)
Central Venous Catheter Insertion Performed by: Val EagleMOSER, Jenine Krisher, anesthesiologist Start/End1/06/2017 7:01 AM, 08/09/2016 7:12 AM Patient location: Pre-op. Preanesthetic checklist: patient identified, IV checked, site marked, risks and benefits discussed, surgical consent, monitors and equipment checked, pre-op evaluation, timeout performed and anesthesia consent Position: Trendelenburg Lidocaine 1% used for infiltration and patient sedated Hand hygiene performed  and maximum sterile barriers used  Catheter size: 8.5 Fr Sheath introducer Procedure performed using ultrasound guided technique. Ultrasound Notes:anatomy identified, needle tip was noted to be adjacent to the nerve/plexus identified, no ultrasound evidence of intravascular and/or intraneural injection and image(s) printed for medical record Attempts: 1 Following insertion, line sutured and dressing applied. Post procedure assessment: blood return through all ports, free fluid flow and no air  Patient tolerated the procedure well with no immediate complications.

## 2016-08-09 NOTE — Interval H&P Note (Signed)
Preop  Interval l Interval Note:  08/09/2016 7:16 AM  Antony SalmonGegorria Cherry-[name]  has presented today for surgery, with the diagnosis of CAD  The various methods of treatment have been discussed with the patient and family. After consideration of risks, benefits and other options for treatment, the patient has consented to  Procedure(s): CORONARY ARTERY BYPASS GRAFTING (CABG) (N/A) TRANSESOPHAGEAL ECHOCARDIOGRAM (TEE) (N/A) as a surgical intervention .  The patient's history has been reviewed, patient examined, no change in status, stable for surgery.  I have reviewed the patient's chart and labs.  Questions were answered to the patient's satisfaction.   Lab Results  Component Value Date   WBC 11.6 (H) 08/09/2016   HGB 13.1 08/09/2016   HCT 37.8 08/09/2016   PLT 265 08/09/2016   GLUCOSE 168 (H) 08/08/2016   CHOL 120 08/08/2016   TRIG 89 08/08/2016   HDL 35 (L) 08/08/2016   LDLCALC 67 08/08/2016   ALT 10 (L) 08/08/2016   AST 16 08/08/2016   NA 137 08/08/2016   K 3.6 08/08/2016   CL 109 08/08/2016   CREATININE 0.55 08/08/2016   BUN 8 08/08/2016   CO2 22 08/08/2016   INR 1.15 08/08/2016   HGBA1C 13.1 (H) 08/08/2016    Delight OvensEdward B Hiep Ollis

## 2016-08-09 NOTE — Anesthesia Procedure Notes (Signed)
Central Venous Catheter Insertion Performed by: Val EagleMOSER, Esaias Cleavenger, anesthesiologist Start/End1/06/2017 7:01 AM, 08/09/2016 7:12 AM Patient location: Pre-op. Preanesthetic checklist: patient identified, IV checked, site marked, risks and benefits discussed, surgical consent, monitors and equipment checked, pre-op evaluation, timeout performed and anesthesia consent Hand hygiene performed  and maximum sterile barriers used  PA cath was placed.Swan type:thermodilution Procedure performed using ultrasound guided technique. Ultrasound Notes:anatomy identified, needle tip was noted to be adjacent to the nerve/plexus identified, no ultrasound evidence of intravascular and/or intraneural injection and image(s) printed for medical record Attempts: 1 Patient tolerated the procedure well with no immediate complications.

## 2016-08-09 NOTE — Progress Notes (Signed)
  Echocardiogram Echocardiogram Transesophageal has been performed.  [name] 08/09/2016, 8:26 AM

## 2016-08-09 NOTE — H&P (View-Only) (Signed)
Patient ID: [name], female   DOB: 15-May-1971, 46 y.o.   MRN: [id number]      [address]       [place] [address]             [phone]                 1 Day Post-Op Procedure(s) (LRB): Left Heart Cath and Coronary Angiography (N/A) Intravascular Pressure Wire/FFR Study (N/A)  LOS: 1 day   Subjective: Some nausea today , abdomen soft, no vomiting   Objective: Vital signs in last 24 hours: Patient Vitals for the past 24 hrs:  BP Temp Temp src Pulse Resp SpO2  08/08/16 1601 (!) 189/101 99.4 F (37.4 C) Oral 96 18 100 %  08/08/16 1500 (!) 180/92 - - - (!) 21 -  08/08/16 1415 (!) 181/81 - - - - -  08/08/16 1300 (!) 186/79 - - 76 12 100 %  08/08/16 1205 (!) 163/111 97.8 F (36.6 C) Oral 82 17 100 %  08/08/16 1100 (!) 160/88 - - 75 (!) 24 98 %  08/08/16 1000 (!) 164/91 - - 80 (!) 21 99 %  08/08/16 0804 (!) 166/79 98.6 F (37 C) Oral 87 16 100 %  08/08/16 0700 135/88 - - 96 - 100 %  08/08/16 0600 (!) 154/77 - - 85 - 100 %  08/08/16 0500 (!) 151/83 - - 84 - 100 %  08/08/16 0400 (!) 165/83 - - 86 - 100 %  08/08/16 0300 (!) 150/82 - - 88 - 99 %  08/08/16 0200 (!) 147/82 - - 84 (!) 24 100 %  08/08/16 0100 139/76 - - 81 - 99 %  08/07/16 2300 (!) 143/67 - - 81 20 98 %  08/07/16 2200 (!) 169/98 - - 86 (!) 26 100 %  08/07/16 2100 (!) 163/78 - - 85 18 100 %  08/07/16 2030 (!) 169/84 - - 87 15 100 %  08/07/16 2025 (!) 196/92 - - 84 17 100 %  08/07/16 2017 (!) 180/72 - Oral 96 14 100 %  08/07/16 2000 (!) 219/103 - - 86 13 100 %  08/07/16 1930 (!) 183/72 - - 92 (!) 25 98 %  08/07/16 1900 (!) 188/84 - - 85 (!) 23 99 %  08/07/16 1830 (!) 213/102 - - 84 (!) 24 99 %    Filed Weights   08/07/16 0139  Weight: 172 lb (78 kg)    Hemodynamic parameters for last 24 hours:    Intake/Output from previous day: 01/10 0701 - 01/11 0700 In: 895.2 [P.O.:720; I.V.:175.2] Out: 1950 [Urine:1950] Intake/Output this shift: Total I/O In: 581 [P.O.:360;  I.V.:221] Out: 200 [Urine:200]  Scheduled Meds: . aspirin  81 mg Oral Daily  . atorvastatin  40 mg Oral q1800  . [START ON 08/09/2016] chlorhexidine  15 mL Mouth/Throat Once  . Chlorhexidine Gluconate Cloth  6 each Topical Once   And  . [START ON 08/09/2016] Chlorhexidine Gluconate Cloth  6 each Topical Once  . insulin aspart  0-15 Units Subcutaneous TID WC  . insulin aspart  0-5 Units Subcutaneous QHS  . insulin aspart protamine- aspart  30 Units Subcutaneous BID WC  . lisinopril  20 mg Oral Daily  . metoprolol tartrate  25 mg Oral BID  . sodium chloride flush  3 mL Intravenous Q12H  . triamcinolone  1 spray Each Nare Daily   Continuous Infusions: . heparin 1,050 Units/hr (08/08/16 1100)   PRN Meds:.sodium chloride, acetaminophen,  ALPRAZolam, fentaNYL (SUBLIMAZE) injection, hydrALAZINE, magnesium hydroxide, nitroGLYCERIN, ondansetron (ZOFRAN) IV, sodium chloride flush, sodium phosphate, zolpidem  General appearance: alert and cooperative Neurologic: intact Heart: regular rate and rhythm, S1, S2 normal, no murmur, click, rub or gallop Lungs: clear to auscultation bilaterally Abdomen: soft, non-tender; bowel sounds normal; no masses,  no organomegaly Extremities: extremities normal, atraumatic, no cyanosis or edema and Homans sign is negative, no sign of DVT Wound: radiacl cath site ok  Lab Results: CBC: Recent Labs  08/07/16 0237 08/08/16 0304  WBC 10.8* 9.7  HGB 13.5 12.0  HCT 38.2 35.1*  PLT 269 223   BMET:  Recent Labs  08/07/16 0237 08/08/16 0304  NA 137 137  K 3.1* 3.6  CL 109 109  CO2 21* 22  GLUCOSE 107* 168*  BUN 9 8  CREATININE 0.52 0.55  CALCIUM 8.9 8.5*    PT/INR:  Recent Labs  08/08/16 0304  LABPROT 14.8  INR 1.15     Radiology Dg Chest 2 View  Result Date: 08/07/2016 CLINICAL DATA:  Shortness of breath with pain across the top of the chest tonight. EXAM: CHEST  2 VIEW COMPARISON:  05/28/2014 FINDINGS: Normal heart size and pulmonary  vascularity. No focal airspace disease or consolidation in the lungs. No blunting of costophrenic angles. No pneumothorax. Mediastinal contours appear intact. Mild degenerative changes in the spine. Surgical clips in the right upper quadrant. IMPRESSION: No active cardiopulmonary disease. Electronically Signed   By: Burman NievesWilliam  Stevens M.D.   On: 08/07/2016 02:18     Assessment/Plan: S/P Procedure(s) (LRB): Left Heart Cath and Coronary Angiography (N/A) Intravascular Pressure Wire/FFR Study (N/A)  Plan cabg, again reviewed plans with patient and husband. The goals risks and alternatives of the planned surgical procedure cabg  have been discussed with the patient in detail. The risks of the procedure including death, infection, stroke, myocardial infarction, bleeding, blood transfusion have all been discussed specifically.  I have quoted [name] a 3% of perioperative mortality and a complication rate as high as 40 %. The patient's questions have been answered.[name] is willing  to proceed with the planned procedure.   Delight OvensEdward B Loren Vicens MD 08/08/2016 6:18 PM

## 2016-08-09 NOTE — Transfer of Care (Signed)
Immediate Anesthesia Transfer of Care Note  Patient: [name]  Procedure(s) Performed: Procedure(s): CORONARY ARTERY BYPASS GRAFTING (CABG) x three,SVG to distal circ OM3, SVG to Om1, LIMA to LAD-using left internal mammary artery and right leg greater saphenous vein harvested endoscopically, Sternal plating - right side (N/A) TRANSESOPHAGEAL ECHOCARDIOGRAM (TEE) (N/A)  Patient Location: ICU  Anesthesia Type:General  Level of Consciousness: sedated and Patient remains intubated per anesthesia plan  Airway & Oxygen Therapy: Patient remains intubated per anesthesia plan and Patient placed on Ventilator (see vital sign flow sheet for setting)  Post-op Assessment: Report given to RN and Post -op Vital signs reviewed and stable  Post vital signs: Reviewed and stable  Last Vitals:  Vitals:   08/09/16 0507 08/09/16 1330  BP: (!) 147/81 (!) 81/61  Pulse: 87   Resp: 18 (!) 0  Temp: 37.2 C 36.3 C    Last Pain:  Vitals:   08/09/16 0507  TempSrc: Oral  PainSc:          Complications: No apparent anesthesia complications

## 2016-08-09 NOTE — OR Nursing (Signed)
12:15 - 45 minute call to SICU charge nurse 

## 2016-08-09 NOTE — Anesthesia Postprocedure Evaluation (Signed)
Anesthesia Post Note  Patient: [name]  Procedure(s) Performed: Procedure(s) (LRB): CORONARY ARTERY BYPASS GRAFTING (CABG) x three,SVG to distal circ OM3, SVG to Om1, LIMA to LAD-using left internal mammary artery and right leg greater saphenous vein harvested endoscopically, Sternal plating - right side (N/A) TRANSESOPHAGEAL ECHOCARDIOGRAM (TEE) (N/A)  Patient location during evaluation: SICU Anesthesia Type: General Level of consciousness: sedated Pain management: pain level controlled Vital Signs Assessment: post-procedure vital signs reviewed and stable Respiratory status: patient remains intubated per anesthesia plan Cardiovascular status: stable Anesthetic complications: no       Last Vitals:  Vitals:   08/09/16 1400 08/09/16 1415  BP: (!) 89/57   Pulse: 95 93  Resp: 18 (!) 0  Temp: 36.3 C 36.3 C    Last Pain:  Vitals:   08/09/16 0507  TempSrc: Oral  PainSc:                  Deborah Dondero,W. EDMOND

## 2016-08-09 NOTE — OR Nursing (Signed)
12:50 - 20 minute call to SICU charge nurse 

## 2016-08-09 NOTE — Progress Notes (Signed)
Patient ID: [name], female   DOB: 04/22/1971, 46 y.o.   MRN: [id number]   SICU Evening Rounds:   Hemodynamically stable  CI = 2.5 on dop 3  About ready for extubation  Urine output good  CT output low  CBC    Component Value Date/Time   WBC 14.5 (H) 08/09/2016 1333   RBC 3.52 (L) 08/09/2016 1333   HGB 9.9 (L) 08/09/2016 1335   HCT 29.0 (L) 08/09/2016 1335   PLT 132 (L) 08/09/2016 1333   MCV 87.2 08/09/2016 1333   MCH 30.1 08/09/2016 1333   MCHC 34.5 08/09/2016 1333   RDW 14.1 08/09/2016 1333   LYMPHSABS 3.0 05/28/2014 0900   MONOABS 0.3 05/28/2014 0900   EOSABS 0.1 05/28/2014 0900   BASOSABS 0.1 05/28/2014 0900     BMET    Component Value Date/Time   NA 142 08/09/2016 1335   K 3.2 (L) 08/09/2016 1335   CL 103 08/09/2016 1217   CO2 22 08/08/2016 0304   GLUCOSE 132 (H) 08/09/2016 1335   BUN 9 08/09/2016 1217   CREATININE 0.50 08/09/2016 1217   CALCIUM 8.5 (L) 08/08/2016 0304   GFRNONAA >60 08/08/2016 0304   GFRAA >60 08/08/2016 0304     A/P:  Stable postop course. Continue current plans

## 2016-08-09 NOTE — Brief Op Note (Addendum)
      [address]       [place] [id number]             [phone]      08/09/2016  8:01 AM  PATIENT:  [name]  46 y.o. female  PRE-OPERATIVE DIAGNOSIS:  CAD  POST-OPERATIVE DIAGNOSIS:  CAD  PROCEDURE:  Procedure(s): CORONARY ARTERY BYPASS GRAFTING (CABG) x three,SVG to distal circ OM3, SVG to Om1, LIMA to LAD-using left internal mammary artery and right leg greater saphenous vein harvested endoscopically, Sternal plating - right side (N/A) TRANSESOPHAGEAL ECHOCARDIOGRAM (TEE) (N/A)  SVG to distal circ SVG to Om1 LIMA to LAD  SURGEON:  Surgeon(s) and Role:    * Delight OvensEdward B Salihah Peckham, MD - Primary  PHYSICIAN ASSISTANT:  Jari Favreessa Conte, PA-C   ANESTHESIA:   general  EBL:  No intake/output data recorded.  BLOOD ADMINISTERED:none  DRAINS: ROUTINE   LOCAL MEDICATIONS USED:  NONE  SPECIMEN:  No Specimen  DISPOSITION OF SPECIMEN:  N/A  COUNTS:  YES   DICTATION: .Other Dictation: Dictation Number pending  PLAN OF CARE: Admit to inpatient   PATIENT DISPOSITION:  ICU - intubated and hemodynamically stable.   Delay start of Pharmacological VTE agent (>24hrs) due to surgical blood loss or risk of bleeding: yes

## 2016-08-10 ENCOUNTER — Inpatient Hospital Stay (HOSPITAL_COMMUNITY): Payer: BLUE CROSS/BLUE SHIELD

## 2016-08-10 DIAGNOSIS — I10 Essential (primary) hypertension: Secondary | ICD-10-CM

## 2016-08-10 LAB — POCT I-STAT, CHEM 8
BUN: 13 mg/dL (ref 6–20)
CALCIUM ION: 1.14 mmol/L — AB (ref 1.15–1.40)
CREATININE: 0.5 mg/dL (ref 0.44–1.00)
Chloride: 104 mmol/L (ref 101–111)
Glucose, Bld: 223 mg/dL — ABNORMAL HIGH (ref 65–99)
HCT: 30 % — ABNORMAL LOW (ref 36.0–46.0)
HEMOGLOBIN: 10.2 g/dL — AB (ref 12.0–15.0)
Potassium: 4 mmol/L (ref 3.5–5.1)
Sodium: 139 mmol/L (ref 135–145)
TCO2: 22 mmol/L (ref 0–100)

## 2016-08-10 LAB — CBC
HCT: 29.2 % — ABNORMAL LOW (ref 36.0–46.0)
HCT: 30.8 % — ABNORMAL LOW (ref 36.0–46.0)
HEMOGLOBIN: 10.1 g/dL — AB (ref 12.0–15.0)
Hemoglobin: 10.2 g/dL — ABNORMAL LOW (ref 12.0–15.0)
MCH: 30.4 pg (ref 26.0–34.0)
MCH: 29.6 pg (ref 26.0–34.0)
MCHC: 34.6 g/dL (ref 30.0–36.0)
MCHC: 33.1 g/dL (ref 30.0–36.0)
MCV: 88 fL (ref 78.0–100.0)
MCV: 89.3 fL (ref 78.0–100.0)
Platelets: 179 10*3/uL (ref 150–400)
Platelets: 191 10*3/uL (ref 150–400)
RBC: 3.32 MIL/uL — AB (ref 3.87–5.11)
RBC: 3.45 MIL/uL — AB (ref 3.87–5.11)
RDW: 14.6 % (ref 11.5–15.5)
RDW: 14.7 % (ref 11.5–15.5)
WBC: 17.9 10*3/uL — ABNORMAL HIGH (ref 4.0–10.5)
WBC: 19.3 10*3/uL — AB (ref 4.0–10.5)

## 2016-08-10 LAB — BASIC METABOLIC PANEL
Anion gap: 5 (ref 5–15)
BUN: 11 mg/dL (ref 6–20)
CHLORIDE: 111 mmol/L (ref 101–111)
CO2: 23 mmol/L (ref 22–32)
Calcium: 7.6 mg/dL — ABNORMAL LOW (ref 8.9–10.3)
Creatinine, Ser: 0.7 mg/dL (ref 0.44–1.00)
GFR calc non Af Amer: >60 mL/min (ref >60)
Glucose, Bld: 107 mg/dL — ABNORMAL HIGH (ref 65–99)
POTASSIUM: 3.8 mmol/L (ref 3.5–5.1)
SODIUM: 139 mmol/L (ref 135–145)

## 2016-08-10 LAB — GLUCOSE, CAPILLARY
GLUCOSE-CAPILLARY: 108 mg/dL — AB (ref 65–99)
GLUCOSE-CAPILLARY: 119 mg/dL — AB (ref 65–99)
GLUCOSE-CAPILLARY: 106 mg/dL — AB (ref 65–99)
GLUCOSE-CAPILLARY: 129 mg/dL — AB (ref 65–99)
GLUCOSE-CAPILLARY: 115 mg/dL — AB (ref 65–99)
GLUCOSE-CAPILLARY: 115 mg/dL — AB (ref 65–99)
GLUCOSE-CAPILLARY: 112 mg/dL — AB (ref 65–99)
GLUCOSE-CAPILLARY: 217 mg/dL — AB (ref 65–99)
Glucose-Capillary: 118 mg/dL — ABNORMAL HIGH (ref 65–99)
Glucose-Capillary: 107 mg/dL — ABNORMAL HIGH (ref 65–99)
Glucose-Capillary: 105 mg/dL — ABNORMAL HIGH (ref 65–99)
Glucose-Capillary: 112 mg/dL — ABNORMAL HIGH (ref 65–99)
Glucose-Capillary: 117 mg/dL — ABNORMAL HIGH (ref 65–99)
Glucose-Capillary: 116 mg/dL — ABNORMAL HIGH (ref 65–99)
Glucose-Capillary: 111 mg/dL — ABNORMAL HIGH (ref 65–99)
Glucose-Capillary: 168 mg/dL — ABNORMAL HIGH (ref 65–99)

## 2016-08-10 LAB — CREATININE, SERUM
CREATININE: 0.77 mg/dL (ref 0.44–1.00)
GFR calc Af Amer: >60 mL/min (ref >60)

## 2016-08-10 LAB — MAGNESIUM
MAGNESIUM: 2.7 mg/dL — AB (ref 1.7–2.4)
MAGNESIUM: 2.3 mg/dL (ref 1.7–2.4)

## 2016-08-10 MED ORDER — FUROSEMIDE 10 MG/ML IJ SOLN
40.0000 mg | Freq: Once | INTRAMUSCULAR | Status: AC
  Administered 2016-08-10: 40 mg via INTRAVENOUS

## 2016-08-10 MED ORDER — INSULIN DETEMIR 100 UNIT/ML ~~LOC~~ SOLN: 30.0000 [IU] | Freq: Every day | SUBCUTANEOUS | Status: DC

## 2016-08-10 MED ORDER — FUROSEMIDE 10 MG/ML IJ SOLN
40.0000 mg | Freq: Once | INTRAMUSCULAR | Status: AC
  Administered 2016-08-10: 40 mg via INTRAVENOUS
  Filled 2016-08-10: qty 4

## 2016-08-10 MED ORDER — TRAMADOL HCL 50 MG PO TABS
50.0000 mg | ORAL_TABLET | Freq: Four times a day (QID) | ORAL | Status: DC | PRN
  Administered 2016-08-10 – 2016-08-16 (×15): 50 mg via ORAL
  Filled 2016-08-10 (×15): qty 1

## 2016-08-10 MED ORDER — SODIUM CHLORIDE 0.9 % IV SOLN
30.0000 meq | Freq: Once | INTRAVENOUS | Status: AC
  Administered 2016-08-10: 30 meq via INTRAVENOUS
  Filled 2016-08-10: qty 15

## 2016-08-10 MED ORDER — ENOXAPARIN SODIUM 40 MG/0.4ML ~~LOC~~ SOLN
40.0000 mg | Freq: Every day | SUBCUTANEOUS | Status: DC
  Administered 2016-08-10 – 2016-08-18 (×9): 40 mg via SUBCUTANEOUS
  Filled 2016-08-10 (×9): qty 0.4

## 2016-08-10 MED ORDER — TRAMADOL HCL 50 MG PO TABS: 50.0000 mg | ORAL_TABLET | Freq: Four times a day (QID) | ORAL | Status: DC

## 2016-08-10 MED ORDER — INSULIN ASPART 100 UNIT/ML ~~LOC~~ SOLN
6.0000 [IU] | Freq: Three times a day (TID) | SUBCUTANEOUS | Status: DC
  Administered 2016-08-10 – 2016-08-19 (×18): 6 [IU] via SUBCUTANEOUS

## 2016-08-10 MED ORDER — INSULIN DETEMIR 100 UNIT/ML ~~LOC~~ SOLN
30.0000 [IU] | Freq: Every day | SUBCUTANEOUS | Status: DC
  Administered 2016-08-10 – 2016-08-19 (×9): 30 [IU] via SUBCUTANEOUS
  Filled 2016-08-10 (×10): qty 0.3

## 2016-08-10 MED ORDER — FUROSEMIDE 10 MG/ML IJ SOLN: 40.0000 mg | Freq: Once | INTRAMUSCULAR | Status: DC

## 2016-08-10 MED ORDER — INSULIN ASPART 100 UNIT/ML ~~LOC~~ SOLN
0.0000 [IU] | SUBCUTANEOUS | Status: DC
  Administered 2016-08-10: 8 [IU] via SUBCUTANEOUS
  Administered 2016-08-10: 4 [IU] via SUBCUTANEOUS
  Administered 2016-08-11 (×5): 2 [IU] via SUBCUTANEOUS
  Administered 2016-08-11: 4 [IU] via SUBCUTANEOUS
  Administered 2016-08-11: 12 [IU] via SUBCUTANEOUS
  Administered 2016-08-12: 4 [IU] via SUBCUTANEOUS
  Administered 2016-08-12 – 2016-08-14 (×7): 2 [IU] via SUBCUTANEOUS

## 2016-08-10 NOTE — Progress Notes (Signed)
1 Day Post-Op Procedure(s) (LRB): CORONARY ARTERY BYPASS GRAFTING (CABG) x three,SVG to distal circ OM3, SVG to Om1, LIMA to LAD-using left internal mammary artery and right leg greater saphenous vein harvested endoscopically, Sternal plating - right side (N/A) TRANSESOPHAGEAL ECHOCARDIOGRAM (TEE) (N/A) Subjective: No complaints  Objective: Vital signs in last 24 hours: Temp:  [97.3 F (36.3 C)-101.3 F (38.5 C)] 99.3 F (37.4 C) (01/13 1200) Pulse Rate:  [88-111] 101 (01/13 1200) Cardiac Rhythm: Normal sinus rhythm (01/13 0722) Resp:  [0-40] 29 (01/13 1200) BP: (71-119)/(48-81) 97/66 (01/13 1200) SpO2:  [97 %-100 %] 99 % (01/13 1200) Arterial Line BP: (90-202)/(49-98) 137/60 (01/13 1200) FiO2 (%):  [40 %-50 %] 40 % (01/12 1713) Weight:  [88.6 kg (195 lb 5.2 oz)] 88.6 kg (195 lb 5.2 oz) (01/13 0615)  Hemodynamic parameters for last 24 hours: PAP: (18-35)/(7-26) 28/14 CO:  [3.6 L/min-5.3 L/min] 3.7 L/min CI:  [2.1 L/min/m2-3 L/min/m2] 2.1 L/min/m2  Intake/Output from previous day: 01/12 0701 - 01/13 0700 In: 5293.2 [I.V.:3588.2; Blood:490; IV Piggyback:1215] Out: 3950 [Urine:2115; Blood:1575; Chest Tube:260] Intake/Output this shift: Total I/O In: 262.5 [I.V.:262.5] Out: 185 [Urine:165; Chest Tube:20]  General appearance: slowed mentation Neurologic: intact Heart: regular rate and rhythm, S1, S2 normal, no murmur, click, rub or gallop Lungs: coarse bilat Extremities: edema mild Wound: dressing dry  Lab Results:  Recent Labs  08/09/16 1856 08/09/16 1857 08/10/16 0439  WBC 14.4*  --  17.9*  HGB 10.3* 12.2 10.1*  HCT 29.4* 36.0 29.2*  PLT 138*  --  179   BMET:  Recent Labs  08/08/16 0304  08/09/16 1857 08/10/16 0439  NA 137  < > 140 139  K 3.6  < > 4.2 3.8  CL 109  < > 106 111  CO2 22  --   --  23  GLUCOSE 168*  < > 204* 107*  BUN 8  < > 9 11  CREATININE 0.55  < > 0.50 0.70  CALCIUM 8.5*  --   --  7.6*  < > = values in this interval not displayed.   PT/INR:  Recent Labs  08/09/16 1333  LABPROT 18.5*  INR 1.53   ABG    Component Value Date/Time   PHART 7.379 08/09/2016 1854   HCO3 22.5 08/09/2016 1854   TCO2 23 08/09/2016 1857   ACIDBASEDEF 2.0 08/09/2016 1854   O2SAT 99.0 08/09/2016 1854   CBG (last 3)   Recent Labs  08/10/16 0844 08/10/16 0951 08/10/16 1048  GLUCAP 112* 117* 116*   CLINICAL DATA:  Postop.  Chest tube.  EXAM: PORTABLE CHEST 1 VIEW  COMPARISON:  August 09, 2016  FINDINGS: The ET and NG tubes have been removed. Other support apparatus is stable. No pneumothorax. Mild atelectasis in the left base. No other interval change or acute abnormality.  IMPRESSION: 1. Support apparatus as above. 2. No other acute abnormalities.   Electronically Signed   By: Gerome Sam III M.D   On: 08/10/2016 08:01  Assessment/Plan: S/P Procedure(s) (LRB): CORONARY ARTERY BYPASS GRAFTING (CABG) x three,SVG to distal circ OM3, SVG to Om1, LIMA to LAD-using left internal mammary artery and right leg greater saphenous vein harvested endoscopically, Sternal plating - right side (N/A) TRANSESOPHAGEAL ECHOCARDIOGRAM (TEE) (N/A)  She is hemodynamically labile but off pressors this am.  Volume excess: wt is 23 lbs over preop if accurate. Start diuretic.  Poorly controlled DM with preop Hgb A1c of 13.1. Start Levemir and SSI with meal coverage. Stop insulin drip.  DC chest  tubes, swan, arterial line.  Mobilize, IS.       LOS: 3 days    Alleen BorneBryan K Braddock Servellon 08/10/2016

## 2016-08-10 NOTE — Progress Notes (Signed)
Patient ID: [name], female   DOB: 12/29/1970, 46 y.o.   MRN: [id number]   SICU Evening Rounds:   Hemodynamically stable  Urine output good with lasix Difficult to mobilize  CBC    Component Value Date/Time   WBC 19.3 (H) 08/10/2016 1607   RBC 3.45 (L) 08/10/2016 1607   HGB 10.2 (L) 08/10/2016 1607   HCT 30.8 (L) 08/10/2016 1607   PLT 191 08/10/2016 1607   MCV 89.3 08/10/2016 1607   MCH 29.6 08/10/2016 1607   MCHC 33.1 08/10/2016 1607   RDW 14.7 08/10/2016 1607   LYMPHSABS 3.0 05/28/2014 0900   MONOABS 0.3 05/28/2014 0900   EOSABS 0.1 05/28/2014 0900   BASOSABS 0.1 05/28/2014 0900     BMET    Component Value Date/Time   NA 139 08/10/2016 1606   K 4.0 08/10/2016 1606   CL 104 08/10/2016 1606   CO2 23 08/10/2016 0439   GLUCOSE 223 (H) 08/10/2016 1606   BUN 13 08/10/2016 1606   CREATININE 0.77 08/10/2016 1607   CALCIUM 7.6 (L) 08/10/2016 0439   GFRNONAA >60 08/10/2016 1607   GFRAA >60 08/10/2016 1607     A/P:  Stable postop course. Continue current plans. Diurese

## 2016-08-10 NOTE — Progress Notes (Signed)
EKG CRITICAL VALUE     12 lead EKG performed.  Critical value noted.Renelda MomPatricia Keatton, RN notified.   Knolan Simien L, CCT 08/10/2016 2:33 PM

## 2016-08-11 ENCOUNTER — Inpatient Hospital Stay (HOSPITAL_COMMUNITY): Payer: BLUE CROSS/BLUE SHIELD

## 2016-08-11 LAB — GLUCOSE, CAPILLARY
GLUCOSE-CAPILLARY: 120 mg/dL — AB (ref 65–99)
GLUCOSE-CAPILLARY: 129 mg/dL — AB (ref 65–99)
GLUCOSE-CAPILLARY: 132 mg/dL — AB (ref 65–99)
GLUCOSE-CAPILLARY: 274 mg/dL — AB (ref 65–99)
GLUCOSE-CAPILLARY: 311 mg/dL — AB (ref 65–99)
Glucose-Capillary: 130 mg/dL — ABNORMAL HIGH (ref 65–99)
Glucose-Capillary: 137 mg/dL — ABNORMAL HIGH (ref 65–99)
Glucose-Capillary: 191 mg/dL — ABNORMAL HIGH (ref 65–99)

## 2016-08-11 LAB — BASIC METABOLIC PANEL
Anion gap: 6 (ref 5–15)
BUN: 15 mg/dL (ref 6–20)
CALCIUM: 7.9 mg/dL — AB (ref 8.9–10.3)
CO2: 25 mmol/L (ref 22–32)
Chloride: 107 mmol/L (ref 101–111)
Creatinine, Ser: 0.58 mg/dL (ref 0.44–1.00)
GFR calc Af Amer: >60 mL/min (ref >60)
GLUCOSE: 119 mg/dL — AB (ref 65–99)
Potassium: 4.1 mmol/L (ref 3.5–5.1)
Sodium: 138 mmol/L (ref 135–145)

## 2016-08-11 LAB — CBC
HCT: 29.4 % — ABNORMAL LOW (ref 36.0–46.0)
Hemoglobin: 9.9 g/dL — ABNORMAL LOW (ref 12.0–15.0)
MCH: 30.7 pg (ref 26.0–34.0)
MCHC: 33.7 g/dL (ref 30.0–36.0)
MCV: 91.3 fL (ref 78.0–100.0)
Platelets: 187 10*3/uL (ref 150–400)
RBC: 3.22 MIL/uL — ABNORMAL LOW (ref 3.87–5.11)
RDW: 15 % (ref 11.5–15.5)
WBC: 21.8 10*3/uL — ABNORMAL HIGH (ref 4.0–10.5)

## 2016-08-11 MED ORDER — DEXTROSE 5 % IV SOLN
2.0000 g | Freq: Three times a day (TID) | INTRAVENOUS | Status: DC
  Administered 2016-08-11 – 2016-08-19 (×24): 2 g via INTRAVENOUS
  Filled 2016-08-11 (×26): qty 2

## 2016-08-11 MED ORDER — POTASSIUM CHLORIDE CRYS ER 20 MEQ PO TBCR
20.0000 meq | EXTENDED_RELEASE_TABLET | Freq: Two times a day (BID) | ORAL | Status: DC
  Administered 2016-08-11 – 2016-08-15 (×10): 20 meq via ORAL
  Filled 2016-08-11 (×10): qty 1

## 2016-08-11 MED ORDER — FUROSEMIDE 10 MG/ML IJ SOLN
80.0000 mg | Freq: Two times a day (BID) | INTRAMUSCULAR | Status: DC
  Administered 2016-08-11 – 2016-08-15 (×9): 80 mg via INTRAVENOUS
  Filled 2016-08-11 (×9): qty 8

## 2016-08-11 NOTE — Plan of Care (Signed)
Problem: Activity: Goal: Risk for activity intolerance will decrease Outcome: Not Progressing Pt requiring 2-3 person assist for OOB. Little effort exhibited. Frequent demonstrations on correct use of coughing pillow without return demostration.  Problem: Cardiac: Goal: Hemodynamic stability will improve Outcome: Progressing Pt with adequate hemodynamics off pressors. Remains in NSR with rate of 90's-100's, SBP > 110's.  Problem: Respiratory: Goal: Respiratory status will improve Outcome: Not Progressing Pt unable to adequately perform pulm. toilet exercises of I.S./ cough, deep breath and flutter vavle. With coaching Pt gets 250 on I.S. and weak NP cough. Lungs sound are clear/diminished throughout and sats of 99% on 2L [place]. Will continue to monitor and assess.

## 2016-08-11 NOTE — Progress Notes (Signed)
Patient ID: [name], female   DOB: 08/18/1970, 46 y.o.   MRN: [id number]  SICU Evening Rounds:  Hemodynamically stable in sinus rhythm  Diuresing well today  Too fatigued to work with PT today when he came.

## 2016-08-11 NOTE — Progress Notes (Signed)
PT Cancellation Note  Patient Details Name: [name] MRN: [id number] DOB: 02/13/1971   Cancelled Treatment:    Reason Eval/Treat Not Completed: Fatigue/lethargy limiting ability to participate. Spoke with nsg, patient just transferred back to bed after being up for several hours this am. Per nsg, 2-3 person assist. Will attempt evaluation next day.   [name] [name] 08/11/2016, 11:13 AM [name], PT DPT  [phone]

## 2016-08-11 NOTE — Progress Notes (Signed)
2 Days Post-Op Procedure(s) (LRB): CORONARY ARTERY BYPASS GRAFTING (CABG) x three,SVG to distal circ OM3, SVG to Om1, LIMA to LAD-using left internal mammary artery and right leg greater saphenous vein harvested endoscopically, Sternal plating - right side (N/A) TRANSESOPHAGEAL ECHOCARDIOGRAM (TEE) (N/A) Subjective:  Responds with "shit on a stick" to any question I ask Feels weak  Objective: Vital signs in last 24 hours: Temp:  [99 F (37.2 C)-100 F (37.8 C)] 99.5 F (37.5 C) (01/14 0734) Pulse Rate:  [93-108] 93 (01/14 0700) Cardiac Rhythm: Normal sinus rhythm (01/13 2000) Resp:  [17-40] 31 (01/14 0700) BP: (97-136)/(63-81) 114/73 (01/14 0700) SpO2:  [99 %-100 %] 99 % (01/14 0700) Arterial Line BP: (130-165)/(55-68) 135/58 (01/13 1215) Weight:  [87.2 kg (192 lb 3.9 oz)] 87.2 kg (192 lb 3.9 oz) (01/14 0600)  Hemodynamic parameters for last 24 hours: PAP: (18-29)/(7-19) 22/12  Intake/Output from previous day: 01/13 0701 - 01/14 0700 In: 2639.1 [P.O.:1140; I.V.:564.1; IV Piggyback:315] Out: 1245 [Urine:1175; Chest Tube:70] Intake/Output this shift: No intake/output data recorded.  General appearance: slowed mentation Neurologic: intact Heart: regular rate and rhythm, S1, S2 normal, no murmur, click, rub or gallop Lungs: diminished breath sounds bibasilar Extremities: edema moderate Wound: dressing dry  Lab Results:  Recent Labs  08/10/16 1607 08/11/16 0330  WBC 19.3* 21.8*  HGB 10.2* 9.9*  HCT 30.8* 29.4*  PLT 191 187   BMET:  Recent Labs  08/10/16 0439 08/10/16 1606 08/10/16 1607 08/11/16 0330  NA 139 139  --  138  K 3.8 4.0  --  4.1  CL 111 104  --  107  CO2 23  --   --  25  GLUCOSE 107* 223*  --  119*  BUN 11 13  --  15  CREATININE 0.70 0.50 0.77 0.58  CALCIUM 7.6*  --   --  7.9*    PT/INR:  Recent Labs  08/09/16 1333  LABPROT 18.5*  INR 1.53   ABG    Component Value Date/Time   PHART 7.379 08/09/2016 1854   HCO3 22.5 08/09/2016 1854   TCO2 22 08/10/2016 1606   ACIDBASEDEF 2.0 08/09/2016 1854   O2SAT 99.0 08/09/2016 1854   CBG (last 3)   Recent Labs  08/11/16 0022 08/11/16 0333 08/11/16 0728  GLUCAP 129* 120* 132*    Assessment/Plan: S/P Procedure(s) (LRB): CORONARY ARTERY BYPASS GRAFTING (CABG) x three,SVG to distal circ OM3, SVG to Om1, LIMA to LAD-using left internal mammary artery and right leg greater saphenous vein harvested endoscopically, Sternal plating - right side (N/A) TRANSESOPHAGEAL ECHOCARDIOGRAM (TEE) (N/A)  She is hemodynamically stable in sinus rhythm  Volume excess: wt is 20 lbs over preop. Continue diuresis and replace K+. Keep foley in today to diurese.  Poorly controlled DM: glucose has been under good control on Levemir, meal coverage and sliding scale but not eating much yet.  Deconditioning: for 46 years old she will barely move and requires multiple nurses so will ask PT to see her.  Leukocytosis that is increasing and LLL atelectasis or infiltrate on CXR. Temp only 99. She is not doing much with IS and flutter valve and could be developing pneumonia so will start empiric Maxipime. I don't think she is going to cooperate with a sputum specimen.   LOS: 4 days    Alleen BorneBryan K Bartle 08/11/2016

## 2016-08-12 ENCOUNTER — Inpatient Hospital Stay (HOSPITAL_COMMUNITY): Payer: BLUE CROSS/BLUE SHIELD

## 2016-08-12 ENCOUNTER — Encounter (HOSPITAL_COMMUNITY): Payer: Self-pay | Admitting: *Deleted

## 2016-08-12 DIAGNOSIS — I639 Cerebral infarction, unspecified: Secondary | ICD-10-CM

## 2016-08-12 DIAGNOSIS — L8990 Pressure ulcer of unspecified site, unspecified stage: Secondary | ICD-10-CM | POA: Insufficient documentation

## 2016-08-12 DIAGNOSIS — Z951 Presence of aortocoronary bypass graft: Secondary | ICD-10-CM

## 2016-08-12 LAB — GLUCOSE, CAPILLARY
GLUCOSE-CAPILLARY: 145 mg/dL — AB (ref 65–99)
GLUCOSE-CAPILLARY: 166 mg/dL — AB (ref 65–99)
Glucose-Capillary: 103 mg/dL — ABNORMAL HIGH (ref 65–99)
Glucose-Capillary: 109 mg/dL — ABNORMAL HIGH (ref 65–99)
Glucose-Capillary: 114 mg/dL — ABNORMAL HIGH (ref 65–99)
Glucose-Capillary: 133 mg/dL — ABNORMAL HIGH (ref 65–99)

## 2016-08-12 LAB — BASIC METABOLIC PANEL
Anion gap: 7 (ref 5–15)
BUN: 19 mg/dL (ref 6–20)
CHLORIDE: 102 mmol/L (ref 101–111)
CO2: 28 mmol/L (ref 22–32)
CREATININE: 0.68 mg/dL (ref 0.44–1.00)
Calcium: 8.1 mg/dL — ABNORMAL LOW (ref 8.9–10.3)
GFR calc non Af Amer: >60 mL/min (ref >60)
Glucose, Bld: 102 mg/dL — ABNORMAL HIGH (ref 65–99)
POTASSIUM: 3.8 mmol/L (ref 3.5–5.1)
Sodium: 137 mmol/L (ref 135–145)

## 2016-08-12 LAB — CBC
HEMATOCRIT: 28.4 % — AB (ref 36.0–46.0)
Hemoglobin: 9.2 g/dL — ABNORMAL LOW (ref 12.0–15.0)
MCH: 29.8 pg (ref 26.0–34.0)
MCHC: 32.4 g/dL (ref 30.0–36.0)
MCV: 91.9 fL (ref 78.0–100.0)
Platelets: 246 10*3/uL (ref 150–400)
RBC: 3.09 MIL/uL — AB (ref 3.87–5.11)
RDW: 15.1 % (ref 11.5–15.5)
WBC: 19.5 10*3/uL — ABNORMAL HIGH (ref 4.0–10.5)

## 2016-08-12 NOTE — Progress Notes (Signed)
Spoke with patient in ICU. Patient is not quite up to her full mental capacity. States that she has seen Dr. Leilani AbleBetti Reese for her diabetes. States that she does not want to see her again. Has been to Urgent Care recently. Her mother was in the room and stated that she needed to find an endocrinologist. Also states that she does not take her insulin all the time. "only when she feels she needs it" HgbA1c is 13.1%. Coordinator will follow up with patient when out of ICU. Smith MinceKendra Kemba Hoppes RN BSN CDE

## 2016-08-12 NOTE — Evaluation (Signed)
Physical Therapy Evaluation Patient Details Name: [name] MRN: [id number] DOB: 06/30/1971 Today's Date: 08/12/2016   History of Present Illness  46 yo s/p CABGx 3 with PNA, pt with post op right weakness. PMHx: DM, HTN  Clinical Impression  Pt initially lethargic on arrival with ability to state name and situation. Pt with very limited strength and function of RUE grossly 1/5 and RLE grossly 2/5. Pt with decreased strength, transfers, gait, function, balance, cognition and inability to care for herself who will benefit from acute therapy to maximize mobility, function, adherence to precautions and balance. Recommend OT consult as well as CIR for D/C. Pt reports mom can be at home with her and that she was very independent PTA working overtime.   HR 89-98 during bed level activity with brief spike to 148 with initial transition to sitting sats 95% on RA    Follow Up Recommendations CIR;Supervision/Assistance - 24 hour    Equipment Recommendations  Rolling walker with 5" wheels    Recommendations for Other Services OT consult;Speech consult     Precautions / Restrictions Precautions Precautions: Fall;Sternal Precaution Comments: right hemiparesis and inattention      Mobility  Bed Mobility Overal bed mobility: Needs Assistance Bed Mobility: Rolling;Sidelying to Sit;Sit to Sidelying Rolling: Max assist Sidelying to sit: Max assist     Sit to sidelying: Max assist General bed mobility comments: max cues and assist to bend knee and roll bil, assist to elevate trunk from surface as well as bring RLE off of bed. With return to bed assist to bring RLE onto bed and control trunk to surface  Transfers Overall transfer level: Needs assistance   Transfers: Sit to/from Stand;Stand Pivot Transfers Sit to Stand: Mod assist Stand pivot transfers: Max assist       General transfer comment: mod assist with bil knees blocked to stand and control descent to bed and bSC. Max  assist with cues to take pivotal steps [facility]  Ambulation/Gait                Stairs            Wheelchair Mobility    Modified Rankin (Stroke Patients Only) Modified Rankin (Stroke Patients Only) Pre-Morbid Rankin Score: No symptoms Modified Rankin: Severe disability     Balance Overall balance assessment: Needs assistance Sitting-balance support: Bilateral upper extremity supported;Feet supported Sitting balance-Leahy Scale: Poor Sitting balance - Comments: EOB grossly 4 min with min-mod assist for balance due to left lean. On BSC pt leaning left onto LUE and aware of not being at midline but unable to correct Postural control: Left lateral lean   Standing balance-Leahy Scale: Zero                               Pertinent Vitals/Pain Pain Assessment: No/denies pain    Home Living Family/patient expects to be discharged to:: Private residence Living Arrangements: Spouse/significant other Available Help at Discharge: Family;Available 24 hours/day Type of Home: House       Home Layout: One level Home Equipment: None Additional Comments: pt lives with spouse who works but mom can assist at DC    Prior Function Level of Independence: Independent               Hand Dominance        Extremity/Trunk Assessment   Upper Extremity Assessment Upper Extremity Assessment: RUE deficits/detail;LUE deficits/detail RUE Deficits / Details: grossly  1/5 with pt stating intact sensation but not formally tested LUE Deficits / Details: grossly WFL at least 3/5    Lower Extremity Assessment Lower Extremity Assessment: RLE deficits/detail;LLE deficits/detail RLE Deficits / Details: 2/5 knee extension, 2/5 hip ABdct/ADD, 1/5 knee flexion LLE Deficits / Details: grossly WFL at least 3/5    Cervical / Trunk Assessment Cervical / Trunk Assessment: Normal  Communication   Communication: No difficulties  Cognition Arousal/Alertness:  Lethargic Behavior During Therapy: Flat affect Overall Cognitive Status: Impaired/Different from baseline Area of Impairment: Orientation;Attention;Memory;Following commands;Problem solving Orientation Level: Disoriented to;Time Current Attention Level: Sustained Memory: Decreased short-term memory;Decreased recall of precautions Following Commands: Follows one step commands inconsistently;Follows one step commands with increased time     Problem Solving: Slow processing;Decreased initiation;Difficulty sequencing;Requires verbal cues;Requires tactile cues General Comments: initially pt not oriented to day, month, year and lethargic. after education pt able to recall time and 2/5 sternal precautions    General Comments      Exercises General Exercises - Lower Extremity Short Arc Quad: AAROM;Right;5 reps;Supine Hip ABduction/ADduction: AROM;Right;Supine;5 reps   Assessment/Plan    PT Assessment Patient needs continued PT services  PT Problem List Decreased strength;Decreased mobility;Decreased safety awareness;Decreased coordination;Decreased activity tolerance;Decreased cognition;Cardiopulmonary status limiting activity;Decreased knowledge of precautions;Decreased balance;Decreased knowledge of use of DME          PT Treatment Interventions DME instruction;Therapeutic activities;Cognitive remediation;Gait training;Therapeutic exercise;Patient/family education;Stair training;Balance training;Functional mobility training;Neuromuscular re-education    PT Goals (Current goals can be found in the Care Plan section)  Acute Rehab PT Goals Patient Stated Goal: return to walking PT Goal Formulation: With patient Time For Goal Achievement: 08/26/16 Potential to Achieve Goals: Fair    Frequency Min 4X/week   Barriers to discharge Decreased caregiver support      Co-evaluation               End of Session Equipment Utilized During Treatment: Gait belt Activity Tolerance:  Patient tolerated treatment well Patient left: in bed;with call bell/phone within reach;with bed alarm set Nurse Communication: Mobility status;Precautions         Time: 1610-9604 PT Time Calculation (min) (ACUTE ONLY): 23 min   Charges:   PT Evaluation $PT Eval Moderate Complexity: 1 Procedure PT Treatments $Therapeutic Activity: 8-22 mins   PT G Codes:        Porchia Sinkler B Toni Demo 2016/09/11, 12:32 PM  Delaney Meigs, PT [phone]

## 2016-08-12 NOTE — Progress Notes (Signed)
Patient ID: [name], female   DOB: 03/14/1971, 46 y.o.   MRN: [id number] EVENING ROUNDS NOTE :     [address]       [place] [id number]             [phone]                 3 Days Post-Op Procedure(s) (LRB): CORONARY ARTERY BYPASS GRAFTING (CABG) x three,SVG to distal circ OM3, SVG to Om1, LIMA to LAD-using left internal mammary artery and right leg greater saphenous vein harvested endoscopically, Sternal plating - right side (N/A) TRANSESOPHAGEAL ECHOCARDIOGRAM (TEE) (N/A)  Total Length of Stay:  LOS: 5 days  BP (!) 167/83   Pulse (!) 102   Temp (!) 100.7 F (38.2 C) (Oral)   Resp (!) 24   Ht 4\' 11"  (1.499 m)   Wt 186 lb 1.1 oz (84.4 kg)   LMP 07/19/2016   SpO2 95%   BMI 37.58 kg/m   .Intake/Output      01/14 0701 - 01/15 0700 01/15 0701 - 01/16 0700   P.O. 580 660   I.V. (mL/kg)  50 (0.6)   Other     IV Piggyback 150 50   Total Intake(mL/kg) 730 (8.6) 760 (9)   Urine (mL/kg/hr) 1890 (0.9) 1070 (1.1)   Chest Tube     Total Output 1890 1070   Net -1160 -310          . sodium chloride Stopped (08/10/16 1501)  . sodium chloride    . sodium chloride    . lactated ringers    . lactated ringers 20 mL/hr at 08/11/16 0600  . nitroGLYCERIN Stopped (08/09/16 2030)     Lab Results  Component Value Date   WBC 19.5 (H) 08/12/2016   HGB 9.2 (L) 08/12/2016   HCT 28.4 (L) 08/12/2016   PLT 246 08/12/2016   GLUCOSE 102 (H) 08/12/2016   CHOL 120 08/08/2016   TRIG 89 08/08/2016   HDL 35 (L) 08/08/2016   LDLCALC 67 08/08/2016   ALT 10 (L) 08/08/2016   AST 16 08/08/2016   NA 137 08/12/2016   K 3.8 08/12/2016   CL 102 08/12/2016   CREATININE 0.68 08/12/2016   BUN 19 08/12/2016   CO2 28 08/12/2016   INR 1.53 08/09/2016   HGBA1C 13.1 (H) 08/08/2016   Ct negative for stroke Carotid dopplers done preop hgba1c done preop 13  More alert this afternoon, moving all extremities to command, right arm weaker then left   Delight OvensEdward B Danisha Brassfield  MD  Beeper [phone] Office [phone] 08/12/2016 6:24 PM

## 2016-08-12 NOTE — Progress Notes (Signed)
Inpatient Rehabilitation  PT has evaluated the patient and is recommending IP rehab, initiating a prescreen request.   Patient was screened by Weldon PickingSusan Karyme Mcconathy for appropriateness for an Inpatient Acute Rehab consult.  At this time, we are recommending Inpatient Rehab consult.  Please order consult if you are agreeable.  Weldon PickingSusan Esbeydi Manago PT Inpatient Rehab Admissions Coordinator Cell [phone] Office [phone]

## 2016-08-12 NOTE — Plan of Care (Signed)
0900 ti ct via bed and monitor   0930 from ct same fashion

## 2016-08-12 NOTE — Progress Notes (Signed)
Patient ID: [name], female   DOB: 1970-10-31, 46 y.o.   MRN: [id number] TCTS DAILY ICU PROGRESS NOTE                   [address]            [place] [address]          [phone]   3 Days Post-Op Procedure(s) (LRB): CORONARY ARTERY BYPASS GRAFTING (CABG) x three,SVG to distal circ OM3, SVG to Om1, LIMA to LAD-using left internal mammary artery and right leg greater saphenous vein harvested endoscopically, Sternal plating - right side (N/A) TRANSESOPHAGEAL ECHOCARDIOGRAM (TEE) (N/A)  Total Length of Stay:  LOS: 5 days   Subjective: Patient answers questions but slow, moves all extremities to command, trouble getting her ambulating   Objective: Vital signs in last 24 hours: Temp:  [97.8 F (36.6 C)-100 F (37.8 C)] 98.7 F (37.1 C) (01/15 0757) Pulse Rate:  [88-111] 95 (01/15 0600) Cardiac Rhythm: Normal sinus rhythm (01/15 0724) Resp:  [22-35] 27 (01/15 0600) BP: (84-146)/(54-87) 137/78 (01/15 0500) SpO2:  [93 %-100 %] 94 % (01/15 0600) Weight:  [186 lb 1.1 oz (84.4 kg)] 186 lb 1.1 oz (84.4 kg) (01/15 0600)  Filed Weights   08/10/16 0615 08/11/16 0600 08/12/16 0600  Weight: 195 lb 5.2 oz (88.6 kg) 192 lb 3.9 oz (87.2 kg) 186 lb 1.1 oz (84.4 kg)    Weight change: -6 lb 2.8 oz (-2.8 kg)   Hemodynamic parameters for last 24 hours:    Intake/Output from previous day: 01/14 0701 - 01/15 0700 In: 730 [P.O.:580; IV Piggyback:150] Out: 1890 [Urine:1890]  Intake/Output this shift: No intake/output data recorded.  Current Meds: Scheduled Meds: . aspirin EC  325 mg Oral Daily   Or  . aspirin  324 mg Per Tube Daily  . atorvastatin  40 mg Oral q1800  . bisacodyl  10 mg Oral Daily   Or  . bisacodyl  10 mg Rectal Daily  . ceFEPime (MAXIPIME) IV  2 g Intravenous Q8H  . docusate sodium  200 mg Oral Daily  . enoxaparin (LOVENOX) injection  40 mg Subcutaneous QHS  . furosemide  80 mg Intravenous BID  . insulin aspart  0-24 Units Subcutaneous  Q4H  . insulin aspart  6 Units Subcutaneous TID WC  . insulin detemir  30 Units Subcutaneous Daily  . metoCLOPramide (REGLAN) injection  10 mg Intravenous Q6H  . metoprolol tartrate  12.5 mg Oral BID   Or  . metoprolol tartrate  12.5 mg Per Tube BID  . pantoprazole  40 mg Oral Daily  . potassium chloride  20 mEq Oral BID  . sodium chloride flush  3 mL Intravenous Q12H   Continuous Infusions: . sodium chloride Stopped (08/10/16 1501)  . sodium chloride    . sodium chloride    . lactated ringers    . lactated ringers 20 mL/hr at 08/11/16 0600  . nitroGLYCERIN Stopped (08/09/16 2030)   PRN Meds:.sodium chloride, lactated ringers, metoprolol, ondansetron (ZOFRAN) IV, sodium chloride flush, traMADol  General appearance: appears older than stated age, distracted, fatigued and slowed mentation Neurologic: moves all extremities , right arm may be weaker then left, neglects the right  Heart: regular rate and rhythm, S1, S2 normal, no murmur, click, rub or gallop Lungs: diminished breath sounds bibasilar Abdomen: soft, non-tender; bowel sounds normal; no masses,  no organomegaly Extremities: extremities normal, atraumatic, no cyanosis or edema and Homans sign is negative, no sign of  DVT Wound: sternum intact  Lab Results: CBC: Recent Labs  08/11/16 0330 08/12/16 0428  WBC 21.8* 19.5*  HGB 9.9* 9.2*  HCT 29.4* 28.4*  PLT 187 246   BMET:  Recent Labs  08/11/16 0330 08/12/16 0428  NA 138 137  K 4.1 3.8  CL 107 102  CO2 25 28  GLUCOSE 119* 102*  BUN 15 19  CREATININE 0.58 0.68  CALCIUM 7.9* 8.1*    CMET: Lab Results  Component Value Date   WBC 19.5 (H) 08/12/2016   HGB 9.2 (L) 08/12/2016   HCT 28.4 (L) 08/12/2016   PLT 246 08/12/2016   GLUCOSE 102 (H) 08/12/2016   CHOL 120 08/08/2016   TRIG 89 08/08/2016   HDL 35 (L) 08/08/2016   LDLCALC 67 08/08/2016   ALT 10 (L) 08/08/2016   AST 16 08/08/2016   NA 137 08/12/2016   K 3.8 08/12/2016   CL 102 08/12/2016    CREATININE 0.68 08/12/2016   BUN 19 08/12/2016   CO2 28 08/12/2016   INR 1.53 08/09/2016   HGBA1C 13.1 (H) 08/08/2016      PT/INR:  Recent Labs  08/09/16 1333  LABPROT 18.5*  INR 1.53   Radiology: Dg Chest Port 1 View  Result Date: 08/11/2016 CLINICAL DATA:  Status post CABG. EXAM: PORTABLE CHEST 1 VIEW COMPARISON:  Yesterday. FINDINGS: The right jugular Swan-Ganz catheter has been removed and the sheath remains in place. The mediastinal and left chest tubes have been removed. Stable post CABG changes. Stable enlarged cardiac silhouette. No pneumothorax. Mildly increased dense left lower lobe airspace opacity. Mild increase in prominence of the pulmonary vasculature and interstitial markings. IMPRESSION: 1. Increased dense left lower lobe atelectasis or pneumonia. 2. Interval mild pulmonary vascular congestion and minimal interstitial pulmonary edema. Electronically Signed   By: Beckie SaltsSteven  Reid M.D.   On: 08/11/2016 07:26   Assessment/Plan: S/P Procedure(s) (LRB): CORONARY ARTERY BYPASS GRAFTING (CABG) x three,SVG to distal circ OM3, SVG to Om1, LIMA to LAD-using left internal mammary artery and right leg greater saphenous vein harvested endoscopically, Sternal plating - right side (N/A) TRANSESOPHAGEAL ECHOCARDIOGRAM (TEE) (N/A) Mobilize Diuresis Diabetes control Continue foley due to strict I&O, patient in ICU and urinary output monitoring renal function stable, slow moving , question neglecting right side- will get ct and neurology consult  Currently on iv antibiotics started yesterday with elevated wbc and atelectasis left lower lobe, no fever and no sputum production      [name] 08/12/2016 8:03 AM

## 2016-08-12 NOTE — Consult Note (Signed)
Admission H&P    Chief Complaint: right sided weakness/neglect HPI: Rosy Estabrook is an 46 y.o. female admitted with chest pain and is now s/p CABG.  Over the last 1-2d there has been some concern about the development of right sided weakness and neglect.  She has no prior h/o stroke.  She had a CT head non-con which was negative.  She did not get contrast and did not have a CT-A of the head and neck.  She was not taking ASA or statin at home.  She has no prior h/o stroke.  History of Stroke: No.  Date last known well:Unable to determine Time last known well: Unable to determine  tPA Given: No:  mRankin:  Past Medical History:  Diagnosis Date  . History of kidney stones    "passed them"  . Hypertension   . Migraine    "daily" (08/07/2016)  . Pancreatic injury    "damage to pancrease related to gallbladder OR"  . Pneumonia X 1  . Type II diabetes mellitus ([place])     Past Surgical History:  Procedure Laterality Date  . CARDIAC CATHETERIZATION  08/07/2016  . CARDIAC CATHETERIZATION N/A 08/07/2016   Procedure: Left Heart Cath and Coronary Angiography;  Surgeon: Belva Crome, MD;  Location: [facility];  Service: Cardiovascular;  Laterality: N/A;  . CARDIAC CATHETERIZATION N/A 08/07/2016   Procedure: Intravascular Pressure Wire/FFR Study;  Surgeon: Belva Crome, MD;  Location: [facility];  Service: Cardiovascular;  Laterality: N/A;  . FACIAL RECONSTRUCTION SURGERY  2009   "facial fractures"  . FRACTURE SURGERY    . LAPAROSCOPIC CHOLECYSTECTOMY    . TUBAL LIGATION      Family History  Problem Relation Age of Onset  . Hypertension Mother   . Diabetes Mother   . Hypertension Father   . Diabetes Father   . Heart attack Father 39    had CABG   Social History:  reports that she has been smoking Cigarettes.  She has a 10.00 pack-year smoking history. She has never used smokeless tobacco. She reports that she does not drink alcohol or use drugs.  Allergies:   Allergies  Allergen Reactions  . Eugenol Hives and Other (See Comments)    Burns mouth  . Hydrochlorothiazide Anaphylaxis  . Morphine And Related Anaphylaxis  . Percocet [Oxycodone-Acetaminophen] Anaphylaxis and Nausea And Vomiting  . Shellfish Allergy Anaphylaxis  . Sulfa Antibiotics Anaphylaxis  . Tomato Anaphylaxis    Medications Prior to Admission  Medication Sig Dispense Refill  . insulin lispro (HUMALOG) 100 UNIT/ML injection Inject 5-20 Units into the skin 3 (three) times daily before meals. Takes 5-20 units as directed per sliding scale    . triamcinolone (NASACORT ALLERGY 24HR) 55 MCG/ACT AERO nasal inhaler Place 1 spray into both nostrils daily.     . insulin NPH-regular Human (NOVOLIN 70/30) (70-30) 100 UNIT/ML injection Inject 25-75 Units into the skin 2 (two) times daily. 75 units in the morning and 25 units in the evening      ROS: Denies headache, swallow issues though family concerned about speech being off  Physical Examination: Blood pressure (!) 149/81, pulse 95, temperature 98.7 F (37.1 C), temperature source Oral, resp. rate (!) 30, height '4\' 11"'  (1.499 m), weight 84.4 kg (186 lb 1.1 oz), last menstrual period 07/19/2016, SpO2 98 %.  Neurologic Examination: MS - awake, alert, oriented, fluent speech, follows commands CN - no right facial droop Motor - has 3/5 right UE and 1/5 right  LE; normal on the left Sensory - intact both sides DTR - diminished but symmetric Coor - normal on left, unable to do on right Station/gait - not tested  Results for orders placed or performed during the hospital encounter of 08/07/16 (from the past 48 hour(s))  Glucose, capillary     Status: Abnormal   Collection Time: 08/10/16 11:47 AM  Result Value Ref Range   Glucose-Capillary 115 (H) 65 - 99 mg/dL  Glucose, capillary     Status: Abnormal   Collection Time: 08/10/16 12:25 PM  Result Value Ref Range   Glucose-Capillary 111 (H) 65 - 99 mg/dL  Glucose, capillary      Status: Abnormal   Collection Time: 08/10/16  1:17 PM  Result Value Ref Range   Glucose-Capillary 112 (H) 65 - 99 mg/dL  Glucose, capillary     Status: Abnormal   Collection Time: 08/10/16  4:06 PM  Result Value Ref Range   Glucose-Capillary 217 (H) 65 - 99 mg/dL   Comment 1 Venous Specimen    Comment 2 Notify RN   I-STAT, chem 8     Status: Abnormal   Collection Time: 08/10/16  4:06 PM  Result Value Ref Range   Sodium 139 135 - 145 mmol/L   Potassium 4.0 3.5 - 5.1 mmol/L   Chloride 104 101 - 111 mmol/L   BUN 13 6 - 20 mg/dL   Creatinine, Ser 0.50 0.44 - 1.00 mg/dL   Glucose, Bld 223 (H) 65 - 99 mg/dL   Calcium, Ion 1.14 (L) 1.15 - 1.40 mmol/L   TCO2 22 0 - 100 mmol/L   Hemoglobin 10.2 (L) 12.0 - 15.0 g/dL   HCT 30.0 (L) 36.0 - 46.0 %  Magnesium     Status: None   Collection Time: 08/10/16  4:07 PM  Result Value Ref Range   Magnesium 2.3 1.7 - 2.4 mg/dL  CBC     Status: Abnormal   Collection Time: 08/10/16  4:07 PM  Result Value Ref Range   WBC 19.3 (H) 4.0 - 10.5 K/uL   RBC 3.45 (L) 3.87 - 5.11 MIL/uL   Hemoglobin 10.2 (L) 12.0 - 15.0 g/dL   HCT 30.8 (L) 36.0 - 46.0 %   MCV 89.3 78.0 - 100.0 fL   MCH 29.6 26.0 - 34.0 pg   MCHC 33.1 30.0 - 36.0 g/dL   RDW 14.7 11.5 - 15.5 %   Platelets 191 150 - 400 K/uL  Creatinine, serum     Status: None   Collection Time: 08/10/16  4:07 PM  Result Value Ref Range   Creatinine, Ser 0.77 0.44 - 1.00 mg/dL   GFR calc non Af Amer >60 >60 mL/min   GFR calc Af Amer >60 >60 mL/min    Comment: (NOTE) The eGFR has been calculated using the CKD EPI equation. This calculation has not been validated in all clinical situations. eGFR's persistently <60 mL/min signify possible Chronic Kidney Disease.   Glucose, capillary     Status: Abnormal   Collection Time: 08/10/16  8:17 PM  Result Value Ref Range   Glucose-Capillary 168 (H) 65 - 99 mg/dL   Comment 1 Capillary Specimen    Comment 2 Notify RN   Glucose, capillary     Status: Abnormal    Collection Time: 08/11/16 12:22 AM  Result Value Ref Range   Glucose-Capillary 129 (H) 65 - 99 mg/dL   Comment 1 Capillary Specimen    Comment 2 Notify RN   Basic metabolic panel  Status: Abnormal   Collection Time: 08/11/16  3:30 AM  Result Value Ref Range   Sodium 138 135 - 145 mmol/L   Potassium 4.1 3.5 - 5.1 mmol/L   Chloride 107 101 - 111 mmol/L   CO2 25 22 - 32 mmol/L   Glucose, Bld 119 (H) 65 - 99 mg/dL   BUN 15 6 - 20 mg/dL   Creatinine, Ser 0.58 0.44 - 1.00 mg/dL   Calcium 7.9 (L) 8.9 - 10.3 mg/dL   GFR calc non Af Amer >60 >60 mL/min   GFR calc Af Amer >60 >60 mL/min    Comment: (NOTE) The eGFR has been calculated using the CKD EPI equation. This calculation has not been validated in all clinical situations. eGFR's persistently <60 mL/min signify possible Chronic Kidney Disease.    Anion gap 6 5 - 15  CBC     Status: Abnormal   Collection Time: 08/11/16  3:30 AM  Result Value Ref Range   WBC 21.8 (H) 4.0 - 10.5 K/uL   RBC 3.22 (L) 3.87 - 5.11 MIL/uL   Hemoglobin 9.9 (L) 12.0 - 15.0 g/dL   HCT 29.4 (L) 36.0 - 46.0 %   MCV 91.3 78.0 - 100.0 fL   MCH 30.7 26.0 - 34.0 pg   MCHC 33.7 30.0 - 36.0 g/dL   RDW 15.0 11.5 - 15.5 %   Platelets 187 150 - 400 K/uL  Glucose, capillary     Status: Abnormal   Collection Time: 08/11/16  3:33 AM  Result Value Ref Range   Glucose-Capillary 120 (H) 65 - 99 mg/dL   Comment 1 Venous Specimen    Comment 2 Notify RN   Glucose, capillary     Status: Abnormal   Collection Time: 08/11/16  7:28 AM  Result Value Ref Range   Glucose-Capillary 132 (H) 65 - 99 mg/dL   Comment 1 Capillary Specimen    Comment 2 Notify RN   Glucose, capillary     Status: Abnormal   Collection Time: 08/11/16 11:53 AM  Result Value Ref Range   Glucose-Capillary 130 (H) 65 - 99 mg/dL   Comment 1 Capillary Specimen    Comment 2 Notify RN   Glucose, capillary     Status: Abnormal   Collection Time: 08/11/16  3:19 PM  Result Value Ref Range    Glucose-Capillary 137 (H) 65 - 99 mg/dL   Comment 1 Capillary Specimen    Comment 2 Notify RN   Glucose, capillary     Status: Abnormal   Collection Time: 08/11/16  7:59 PM  Result Value Ref Range   Glucose-Capillary 311 (H) 65 - 99 mg/dL   Comment 1 Notify RN    Comment 2 Document in Chart   Glucose, capillary     Status: Abnormal   Collection Time: 08/11/16  8:11 PM  Result Value Ref Range   Glucose-Capillary 274 (H) 65 - 99 mg/dL   Comment 1 Capillary Specimen   Glucose, capillary     Status: Abnormal   Collection Time: 08/11/16 11:40 PM  Result Value Ref Range   Glucose-Capillary 191 (H) 65 - 99 mg/dL   Comment 1 Notify RN    Comment 2 Document in Chart   Glucose, capillary     Status: Abnormal   Collection Time: 08/12/16  3:49 AM  Result Value Ref Range   Glucose-Capillary 103 (H) 65 - 99 mg/dL   Comment 1 Notify RN    Comment 2 Document in Chart   Basic metabolic  panel     Status: Abnormal   Collection Time: 08/12/16  4:28 AM  Result Value Ref Range   Sodium 137 135 - 145 mmol/L   Potassium 3.8 3.5 - 5.1 mmol/L   Chloride 102 101 - 111 mmol/L   CO2 28 22 - 32 mmol/L   Glucose, Bld 102 (H) 65 - 99 mg/dL   BUN 19 6 - 20 mg/dL   Creatinine, Ser 0.68 0.44 - 1.00 mg/dL   Calcium 8.1 (L) 8.9 - 10.3 mg/dL   GFR calc non Af Amer >60 >60 mL/min   GFR calc Af Amer >60 >60 mL/min    Comment: (NOTE) The eGFR has been calculated using the CKD EPI equation. This calculation has not been validated in all clinical situations. eGFR's persistently <60 mL/min signify possible Chronic Kidney Disease.    Anion gap 7 5 - 15  CBC     Status: Abnormal   Collection Time: 08/12/16  4:28 AM  Result Value Ref Range   WBC 19.5 (H) 4.0 - 10.5 K/uL   RBC 3.09 (L) 3.87 - 5.11 MIL/uL   Hemoglobin 9.2 (L) 12.0 - 15.0 g/dL   HCT 28.4 (L) 36.0 - 46.0 %   MCV 91.9 78.0 - 100.0 fL   MCH 29.8 26.0 - 34.0 pg   MCHC 32.4 30.0 - 36.0 g/dL   RDW 15.1 11.5 - 15.5 %   Platelets 246 150 - 400 K/uL   Glucose, capillary     Status: Abnormal   Collection Time: 08/12/16  7:47 AM  Result Value Ref Range   Glucose-Capillary 145 (H) 65 - 99 mg/dL   Ct Head Wo Contrast  Result Date: 08/12/2016 CLINICAL DATA:  CABG last week.  Right facial droop. EXAM: CT HEAD WITHOUT CONTRAST TECHNIQUE: Contiguous axial images were obtained from the base of the skull through the vertex without intravenous contrast. COMPARISON:  MRI 05/28/2014, CT 05/28/2014 FINDINGS: Brain: Ventricle size normal.  Cerebral volume normal. Patchy hypodensities in the cerebral white matter bilaterally similar to prior studies and most consistent with chronic microvascular ischemia. Negative for intracranial hemorrhage. Negative for acute infarct or mass. Vascular: No hyperdense vessel or unexpected calcification. Skull: Negative Sinuses/Orbits: Negative Other: None IMPRESSION: No acute intracranial abnormality. Chronic white matter changes consistent with microvascular ischemia. Electronically Signed   By: Franchot Gallo M.D.   On: 08/12/2016 09:51   Dg Chest Port 1 View  Result Date: 08/12/2016 CLINICAL DATA:  Sore chest post CABG. EXAM: PORTABLE CHEST 1 VIEW COMPARISON:  08/11/2016 FINDINGS: Sternotomy wires and right IJ central venous sheath unchanged. Lungs are hypoinflated demonstrate mild prominence of the perihilar markings with slight interval improvement likely minimal vascular congestion. Stable mild left base opacification likely small effusion with atelectasis. Mild stable cardiomegaly. Remainder of the exam is unchanged. IMPRESSION: Hypoinflation with stable left base opacification likely small effusion with atelectasis. Mild stable cardiomegaly with possible mild vascular congestion improved. Right IJ central venous sheath unchanged. Electronically Signed   By: Marin Olp M.D.   On: 08/12/2016 08:07   Dg Chest Port 1 View  Result Date: 08/11/2016 CLINICAL DATA:  Status post CABG. EXAM: PORTABLE CHEST 1 VIEW COMPARISON:   Yesterday. FINDINGS: The right jugular Swan-Ganz catheter has been removed and the sheath remains in place. The mediastinal and left chest tubes have been removed. Stable post CABG changes. Stable enlarged cardiac silhouette. No pneumothorax. Mildly increased dense left lower lobe airspace opacity. Mild increase in prominence of the pulmonary vasculature and interstitial markings. IMPRESSION:  1. Increased dense left lower lobe atelectasis or pneumonia. 2. Interval mild pulmonary vascular congestion and minimal interstitial pulmonary edema. Electronically Signed   By: Claudie Revering M.D.   On: 08/11/2016 07:26   Assessment: 46 y.o. female with probable new stroke causing right sided weakness despite negative CT head non-con  Stroke Risk Factors - multiple  Plan: 1. HgbA1c, fasting lipid panel 2. MRI of the brain with and without contrast when safe to do so 3. PT consult, OT consult, Speech consult 4. Echocardiogram 5. CT-A of the head and neck 6. Prophylactic therapy - continue ASA and statin 7. Risk factor modification 7. Telemetry monitoring  Will be followed by stroke team.  Stroke education, treatment, and current plan discussed with patient/significant other: Yes.    Doren Custard 08/12/2016, 10:57 AM

## 2016-08-12 NOTE — Op Note (Signed)
NAMFranne Grip:  Vega, [name]      ACCOUNT NO.:  [id number]  MEDICAL RECORD NO.:  [id number]  LOCATION:  B18C                         FACILITY:  [facility]  PHYSICIAN:  Sheliah PlaneEdward Gilberto Streck, MD    DATE OF BIRTH:  Jan 20, 1971  DATE OF PROCEDURE:  08/09/2016 DATE OF DISCHARGE:                              OPERATIVE REPORT   POSTOPERATIVE DIAGNOSIS:  Coronary occlusive disease with unstable angina.  POSTOPERATIVE DIAGNOSIS:  Coronary occlusive disease with unstable angina.  SURGICAL PROCEDURE:  Coronary artery bypass grafting x3 with left internal mammary to the left anterior descending coronary artery, reverse saphenous vein graft to the first obtuse marginal, reverse saphenous vein graft to the third obtuse marginal with right leg, thigh, and calf, greater saphenous vein harvesting endoscopically.  Sternal plating, right side of sternum.  SURGEON:  Sheliah PlaneEdward Olivier Frayre, M.D.  FIRST ASSISTANT:  Jari Favreessa Conte, PA.  BRIEF HISTORY:  The patient is a 46 year old, diabetic female with longstanding poorly controlled hypertension, who presented with unstable anginal symptoms.  She underwent cardiac catheterization by Dr. Verdis PrimeHenry Smith.  This demonstrated significant 3-vessel coronary artery disease. 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  The posterior descending branches arose from the distal circumflex.  The vessel was very small and diffusely diseased and not bypassable.  The LAD had a proximal stenosis of greater than 80%. There was also significant distal disease.  The patient's circumflex system, the circumflex had 3 obtuse marginal branches.  The first and third were the largest, the second was much smaller, all were diffusely diseased, but the first and third were bypassable.  Because of the patient's anatomy, symptoms and history of diabetes, it was felt that coronary artery bypass grafting would offer the best long-term solution for her in  spite of her anatomy with significant distal disease.  Risks and options were discussed with her in detail.  She was agreeable and signed informed consent.  DESCRIPTION OF PROCEDURE:  With Swan-Ganz and arterial line monitors in place, the patient underwent general endotracheal anesthesia without incidence.  Skin, the chest, and legs was prepped with Betadine and draped in usual sterile manner.  A TEE probe was also placed. Appropriate time-out was performed.  We proceeded with harvesting the right greater saphenous vein in the thigh and calf.  Endoscopically, the vein was of excellent quality.  Median sternotomy was performed.  Left internal mammary artery was dissected down as a pedicle graft.  The distal artery was divided and had good free flow.  Pericardium was opened.  Overall, ventricular function appeared preserved.  The patient was systemically heparinized.  Ascending aorta was cannulated.  The right atrium was cannulated.  Aortic root vent cardioplegia needle was introduced into the ascending aorta.  The patient was placed on cardiopulmonary bypass 2.4 L/min/m2.  Sites of anastomosis were selected and dissected out of the epicardium.  It was noted at the time of cardiac catheterization, no definite ostium of the right coronary artery could be located anatomically.  The patient had no obvious right coronary artery.  The posterior descending branch was arising from the distal circumflex.  As noted, the first obtuse marginal was a small vessel, but was bypassable.  The second was very small.  The third vessel was slightly larger and longer, but had obvious distal disease. The patient's body temperature was cooled to 32 degrees.  Aortic crossclamp was applied and 500 mL of cold blood potassium cardioplegia was administered with diastolic arrest of the heart.  Myocardial septal temperature was monitored throughout the cross-clamp.  Attention was turned first to the OM1.  This vessel  was opened, admitted 1 mm probe. Using a running 8-0 Prolene, the segment of reverse saphenous vein graft was anastomosed to the first obtuse marginal.  The third obtuse marginal in the midportion of the vessel was opened and was approximately 1.2-1.3 mm in size.  Using a running 7-0 Prolene, distal anastomosis was performed.  Additional cold blood cardioplegia was administered down the vein grafts.  Attention was then turned to the left anterior descending coronary artery.  Between the mid distal third, the vessel was opened, admitted 1.5 mm probe distally.  Using a running 8-0 Prolene, left internal mammary artery was anastomosed to left anterior descending coronary artery.  With release of the bulldog on the mammary artery, there was appropriate rise in myocardial septal temperature.  With the crossclamp still in place, 2 punch aortotomies were performed and each of the two vein grafts were anastomosed to the ascending aorta.  The heart was allowed to passively fill and de-air, the aortic bulldog on the mammary artery was removed with prompt rise in myocardial septal temperature.  Aortic crossclamp was removed with crossclamp time of 85 minutes.  Sites of anastomosis were inspected and free of bleeding.  The patient spontaneously converted to a sinus rhythm.  Atrial and ventricular pacing wires were applied.  Graft marker was applied.  The patient was then ventilated and weaned from cardiopulmonary bypass.  She remained hemodynamically stable.  She was decannulated in usual fashion. Protamine sulfate was administered with operative field hemostatic. Atrial and ventricular pacing wires had been applied.  The left pleural tube and a Blake mediastinal drain were left in place.  Pericardium was loosely reapproximated.  The right side of the sternum was slightly more narrow and there was concern of the wires pulling through and thus, a KLS sternal plate was cut to appropriate length and  placed along the right sternal border with self tapping 10 mm screws.  This gave better purchase and security so the wires would not pull through.  The sternum was closed with #6 stainless steel wire.  Fascia was closed with interrupted 0 Vicryl, running 3-0 Vicryl in the subcutaneous tissue, and 4-0 subcuticular stitch with skin edges.  Dry dressings were applied. Sponge and needle counts were reported as correct after completion of the procedure.  The patient tolerated the procedure without obvious complication and was transferred to the Surgical Intensive Care Unit for further postoperative care.  Total pump time was 125 minutes.     Sheliah Plane, MD     EG/MEDQ  D:  08/11/2016  T:  08/12/2016  Job:  [id number]

## 2016-08-13 ENCOUNTER — Encounter (HOSPITAL_COMMUNITY): Payer: Self-pay | Admitting: Radiology

## 2016-08-13 ENCOUNTER — Inpatient Hospital Stay (HOSPITAL_COMMUNITY): Payer: BLUE CROSS/BLUE SHIELD

## 2016-08-13 DIAGNOSIS — E1169 Type 2 diabetes mellitus with other specified complication: Secondary | ICD-10-CM

## 2016-08-13 DIAGNOSIS — R0682 Tachypnea, not elsewhere classified: Secondary | ICD-10-CM

## 2016-08-13 DIAGNOSIS — I639 Cerebral infarction, unspecified: Secondary | ICD-10-CM

## 2016-08-13 DIAGNOSIS — G43909 Migraine, unspecified, not intractable, without status migrainosus: Secondary | ICD-10-CM

## 2016-08-13 DIAGNOSIS — D62 Acute posthemorrhagic anemia: Secondary | ICD-10-CM

## 2016-08-13 DIAGNOSIS — E8809 Other disorders of plasma-protein metabolism, not elsewhere classified: Secondary | ICD-10-CM

## 2016-08-13 DIAGNOSIS — E46 Unspecified protein-calorie malnutrition: Secondary | ICD-10-CM

## 2016-08-13 DIAGNOSIS — D72829 Elevated white blood cell count, unspecified: Secondary | ICD-10-CM

## 2016-08-13 DIAGNOSIS — E669 Obesity, unspecified: Secondary | ICD-10-CM

## 2016-08-13 DIAGNOSIS — I25810 Atherosclerosis of coronary artery bypass graft(s) without angina pectoris: Secondary | ICD-10-CM

## 2016-08-13 DIAGNOSIS — R7309 Other abnormal glucose: Secondary | ICD-10-CM

## 2016-08-13 DIAGNOSIS — E1159 Type 2 diabetes mellitus with other circulatory complications: Secondary | ICD-10-CM

## 2016-08-13 LAB — GLUCOSE, CAPILLARY
GLUCOSE-CAPILLARY: 106 mg/dL — AB (ref 65–99)
GLUCOSE-CAPILLARY: 123 mg/dL — AB (ref 65–99)
GLUCOSE-CAPILLARY: 131 mg/dL — AB (ref 65–99)
GLUCOSE-CAPILLARY: 144 mg/dL — AB (ref 65–99)
GLUCOSE-CAPILLARY: 62 mg/dL — AB (ref 65–99)
Glucose-Capillary: 149 mg/dL — ABNORMAL HIGH (ref 65–99)
Glucose-Capillary: 125 mg/dL — ABNORMAL HIGH (ref 65–99)

## 2016-08-13 LAB — CBC
HCT: 27.6 % — ABNORMAL LOW (ref 36.0–46.0)
Hemoglobin: 9 g/dL — ABNORMAL LOW (ref 12.0–15.0)
MCH: 30 pg (ref 26.0–34.0)
MCHC: 32.6 g/dL (ref 30.0–36.0)
MCV: 92 fL (ref 78.0–100.0)
Platelets: 318 10*3/uL (ref 150–400)
RBC: 3 MIL/uL — ABNORMAL LOW (ref 3.87–5.11)
RDW: 14.9 % (ref 11.5–15.5)
WBC: 14.7 10*3/uL — ABNORMAL HIGH (ref 4.0–10.5)

## 2016-08-13 LAB — BASIC METABOLIC PANEL
Anion gap: 7 (ref 5–15)
BUN: 17 mg/dL (ref 6–20)
CO2: 29 mmol/L (ref 22–32)
Calcium: 8 mg/dL — ABNORMAL LOW (ref 8.9–10.3)
Chloride: 101 mmol/L (ref 101–111)
Creatinine, Ser: 0.57 mg/dL (ref 0.44–1.00)
GFR calc Af Amer: >60 mL/min (ref >60)
GFR calc non Af Amer: >60 mL/min (ref >60)
Glucose, Bld: 67 mg/dL (ref 65–99)
Potassium: 3.6 mmol/L (ref 3.5–5.1)
Sodium: 137 mmol/L (ref 135–145)

## 2016-08-13 LAB — HEPATIC FUNCTION PANEL
ALT: 53 U/L (ref 14–54)
AST: 52 U/L — ABNORMAL HIGH (ref 15–41)
Albumin: 2.1 g/dL — ABNORMAL LOW (ref 3.5–5.0)
Alkaline Phosphatase: 71 U/L (ref 38–126)
Bilirubin, Direct: 0.2 mg/dL (ref 0.1–0.5)
Indirect Bilirubin: 0.6 mg/dL (ref 0.3–0.9)
Total Bilirubin: 0.8 mg/dL (ref 0.3–1.2)
Total Protein: 5.8 g/dL — ABNORMAL LOW (ref 6.5–8.1)

## 2016-08-13 MED ORDER — CLOPIDOGREL BISULFATE 75 MG PO TABS
75.0000 mg | ORAL_TABLET | Freq: Every day | ORAL | Status: DC
  Administered 2016-08-14 – 2016-08-19 (×6): 75 mg via ORAL
  Filled 2016-08-13 (×6): qty 1

## 2016-08-13 MED ORDER — SODIUM CHLORIDE 0.9% FLUSH
10.0000 mL | INTRAVENOUS | Status: DC | PRN
  Administered 2016-08-14: 20 mL
  Administered 2016-08-16 – 2016-08-19 (×5): 10 mL
  Filled 2016-08-13 (×6): qty 40

## 2016-08-13 MED ORDER — ATORVASTATIN CALCIUM 80 MG PO TABS
80.0000 mg | ORAL_TABLET | Freq: Every day | ORAL | Status: DC
  Administered 2016-08-13 – 2016-08-19 (×7): 80 mg via ORAL
  Filled 2016-08-13 (×7): qty 1

## 2016-08-13 MED ORDER — CLOPIDOGREL BISULFATE 75 MG PO TABS
300.0000 mg | ORAL_TABLET | Freq: Once | ORAL | Status: AC
  Administered 2016-08-13: 300 mg via ORAL
  Filled 2016-08-13: qty 4

## 2016-08-13 MED ORDER — IOPAMIDOL (ISOVUE-370) INJECTION 76%
INTRAVENOUS | Status: AC
  Administered 2016-08-13: 50 mL
  Filled 2016-08-13: qty 50

## 2016-08-13 MED FILL — Adenosine IV Soln 12 MG/4ML: INTRAVENOUS | Qty: 16 | Status: AC

## 2016-08-13 NOTE — Consult Note (Signed)
Physical Medicine and Rehabilitation Consult   Reason for Consult: Right sided weakness and right neglect Referring Physician: Dr. Tyrone Sage   HPI: [name] is a 46 y.o. female with history of HTN, migraines, T2DM--poorly controlled; who was admitted on 08/07/16 with severe CP and History taken from patient, but mainly chart review.  She reports family members available for assistance post-discharge. cardiac cath done revealing severe multivessel disease. She underwent CABG X 3 with sternal plating. Post op with lethargy and weakness. She was found to have right sided weakness with neglect.  CT head done yesterday and was negative for acute changes. Neurology consulted yesterday and recommended full work up for probable new stroke. Diabetes coordinator following to help manage uncontrolled BS. PT evaluation done yesterday revealing patient to have cognitive deficits, lethargy and right sided weakness affecting mobility. CIR recommended for follow up therapy.   Intraoperative TEE without PFO. BLE dopplers pending. CTA head/neck done today, results pending.  Discussed with Dr. Roda Shutters who feels that stroke likely embolic due to surgery--ASA and Plavix for stroke prevention and 30 day monitor after discharge.   Review of Systems  Constitutional: Positive for malaise/fatigue. Negative for chills and fever.  Cardiovascular: Positive for chest pain.  Gastrointestinal: Positive for constipation.  Musculoskeletal: Positive for myalgias.  Neurological: Positive for sensory change, speech change, focal weakness and weakness.  All other systems reviewed and are negative.  Past Medical History:  Diagnosis Date  . History of kidney stones    "passed them"  . Hypertension   . Migraine    "daily" (08/07/2016)  . Pancreatic injury    "damage to pancrease related to gallbladder OR"  . Pneumonia X 1  . Type II diabetes mellitus (HCC)     Past Surgical History:  Procedure Laterality  Date  . CARDIAC CATHETERIZATION  08/07/2016  . CARDIAC CATHETERIZATION N/A 08/07/2016   Procedure: Left Heart Cath and Coronary Angiography;  Surgeon: Lyn Records, MD;  Location: [facility] INVASIVE CV LAB;  Service: Cardiovascular;  Laterality: N/A;  . CARDIAC CATHETERIZATION N/A 08/07/2016   Procedure: Intravascular Pressure Wire/FFR Study;  Surgeon: Lyn Records, MD;  Location: [facility] INVASIVE CV LAB;  Service: Cardiovascular;  Laterality: N/A;  . CORONARY ARTERY BYPASS GRAFT N/A 08/09/2016   Procedure: CORONARY ARTERY BYPASS GRAFTING (CABG) x three,SVG to distal circ OM3, SVG to Om1, LIMA to LAD-using left internal mammary artery and right leg greater saphenous vein harvested endoscopically, Sternal plating - right side;  Surgeon: Delight Ovens, MD;  Location: [facility] OR;  Service: Open Heart Surgery;  Laterality: N/A;  . FACIAL RECONSTRUCTION SURGERY  2009   "facial fractures"  . FRACTURE SURGERY    . LAPAROSCOPIC CHOLECYSTECTOMY    . TEE WITHOUT CARDIOVERSION N/A 08/09/2016   Procedure: TRANSESOPHAGEAL ECHOCARDIOGRAM (TEE);  Surgeon: Delight Ovens, MD;  Location: [facility] OR;  Service: Open Heart Surgery;  Laterality: N/A;  . TUBAL LIGATION      Family History  Problem Relation Age of Onset  . Hypertension Mother   . Diabetes Mother   . Hypertension Father   . Diabetes Father   . Heart attack Father 70    had CABG    Social History:  reports that she has been smoking Cigarettes.  She has a 10.00 pack-year smoking history. She has never used smokeless tobacco. She reports that she does not drink alcohol or use drugs.    Allergies  Allergen Reactions  . Eugenol Hives and Other (See Comments)  Burns mouth  . Hydrochlorothiazide Anaphylaxis  . Morphine And Related Anaphylaxis  . Percocet [Oxycodone-Acetaminophen] Anaphylaxis and Nausea And Vomiting  . Shellfish Allergy Anaphylaxis  . Sulfa Antibiotics Anaphylaxis  . Tomato Anaphylaxis    Medications Prior to Admission  Medication Sig  Dispense Refill  . insulin lispro (HUMALOG) 100 UNIT/ML injection Inject 5-20 Units into the skin 3 (three) times daily before meals. Takes 5-20 units as directed per sliding scale    . triamcinolone (NASACORT ALLERGY 24HR) 55 MCG/ACT AERO nasal inhaler Place 1 spray into both nostrils daily.     . insulin NPH-regular Human (NOVOLIN 70/30) (70-30) 100 UNIT/ML injection Inject 25-75 Units into the skin 2 (two) times daily. 75 units in the morning and 25 units in the evening      Home: Home Living Family/patient expects to be discharged to:: Private residence Living Arrangements: Spouse/significant other Available Help at Discharge: Family, Available 24 hours/day Type of Home: House Home Layout: One level Home Equipment: None Additional Comments: pt lives with spouse who works but mom can assist at [employer]  Functional History: Prior Function Level of Independence: Independent Functional Status:  Mobility: Bed Mobility Overal bed mobility: Needs Assistance Bed Mobility: Rolling, Sidelying to Sit, Sit to Sidelying Rolling: Max assist Sidelying to sit: Max assist Sit to sidelying: Max assist General bed mobility comments: max cues and assist to bend knee and roll bil, assist to elevate trunk from surface as well as bring RLE off of bed. With return to bed assist to bring RLE onto bed and control trunk to surface Transfers Overall transfer level: Needs assistance Transfers: Sit to/from Stand, Stand Pivot Transfers Sit to Stand: Mod assist Stand pivot transfers: Max assist General transfer comment: mod assist with bil knees blocked to stand and control descent to bed and bSC. Max assist with cues to take pivotal steps to step bed <> BSC      ADL:    Cognition: Cognition Overall Cognitive Status: Impaired/Different from baseline Orientation Level: Oriented to person, Oriented to place, Oriented to situation Cognition Arousal/Alertness: Lethargic Behavior During Therapy: Flat  affect Overall Cognitive Status: Impaired/Different from baseline Area of Impairment: Orientation, Attention, Memory, Following commands, Problem solving Orientation Level: Disoriented to, Time Current Attention Level: Sustained Memory: Decreased short-term memory, Decreased recall of precautions Following Commands: Follows one step commands inconsistently, Follows one step commands with increased time Problem Solving: Slow processing, Decreased initiation, Difficulty sequencing, Requires verbal cues, Requires tactile cues General Comments: initially pt not oriented to day, month, year and lethargic. after education pt able to recall time and 2/5 sternal precautions   Blood pressure (!) 143/89, pulse 98, temperature 98.3 F (36.8 C), temperature source Oral, resp. rate (!) 30, height 4\' 11"  (1.499 m), weight 87.7 kg (193 lb 5.5 oz), last menstrual period 07/19/2016, SpO2 97 %. Physical Exam  Vitals reviewed. Constitutional: She appears well-developed.  Obese  HENT:  Head: Normocephalic and atraumatic.  Eyes: EOM are normal. Scleral icterus is present.  Neck: Normal range of motion. Neck supple.  Cardiovascular: Normal rate and regular rhythm.   Respiratory: No respiratory distress. She has no wheezes.  Poor inspiratory effort +Tachypnea  GI: Soft. Bowel sounds are normal.  Musculoskeletal: She exhibits no edema or tenderness.  Neurological:  Lethargic, but arousable Left facial weakness Motor: RUE: 0/5 proximal to distal RLE: 1/5 proximal to distal LUE: 4-/5 proximal to distal LLE: 3+/5 proximal to distal DTRs symmetric Sensation diminished to light touch throughout, L>R  Skin: Skin is warm and  dry.  Psychiatric: Her affect is blunt. Her speech is delayed. She is slowed.    Results for orders placed or performed during the hospital encounter of 08/07/16 (from the past 24 hour(s))  Glucose, capillary     Status: Abnormal   Collection Time: 08/12/16 12:55 PM  Result Value Ref  Range   Glucose-Capillary 109 (H) 65 - 99 mg/dL   Comment 1 Capillary Specimen    Comment 2 Notify RN   Glucose, capillary     Status: Abnormal   Collection Time: 08/12/16  4:12 PM  Result Value Ref Range   Glucose-Capillary 114 (H) 65 - 99 mg/dL   Comment 1 Capillary Specimen    Comment 2 Notify RN   Glucose, capillary     Status: Abnormal   Collection Time: 08/12/16  8:21 PM  Result Value Ref Range   Glucose-Capillary 166 (H) 65 - 99 mg/dL   Comment 1 Notify RN   Glucose, capillary     Status: Abnormal   Collection Time: 08/12/16 11:54 PM  Result Value Ref Range   Glucose-Capillary 133 (H) 65 - 99 mg/dL   Comment 1 Capillary Specimen   Glucose, capillary     Status: Abnormal   Collection Time: 08/13/16  4:31 AM  Result Value Ref Range   Glucose-Capillary 62 (L) 65 - 99 mg/dL   Comment 1 Venous Specimen   Basic metabolic panel     Status: Abnormal   Collection Time: 08/13/16  4:40 AM  Result Value Ref Range   Sodium 137 135 - 145 mmol/L   Potassium 3.6 3.5 - 5.1 mmol/L   Chloride 101 101 - 111 mmol/L   CO2 29 22 - 32 mmol/L   Glucose, Bld 67 65 - 99 mg/dL   BUN 17 6 - 20 mg/dL   Creatinine, Ser 0.980.57 0.44 - 1.00 mg/dL   Calcium 8.0 (L) 8.9 - 10.3 mg/dL   GFR calc non Af Amer >60 >60 mL/min   GFR calc Af Amer >60 >60 mL/min   Anion gap 7 5 - 15  CBC     Status: Abnormal   Collection Time: 08/13/16  4:40 AM  Result Value Ref Range   WBC 14.7 (H) 4.0 - 10.5 K/uL   RBC 3.00 (L) 3.87 - 5.11 MIL/uL   Hemoglobin 9.0 (L) 12.0 - 15.0 g/dL   HCT 11.927.6 (L) 14.736.0 - 82.946.0 %   MCV 92.0 78.0 - 100.0 fL   MCH 30.0 26.0 - 34.0 pg   MCHC 32.6 30.0 - 36.0 g/dL   RDW 56.214.9 13.011.5 - 86.515.5 %   Platelets 318 150 - 400 K/uL  Hepatic function panel     Status: Abnormal   Collection Time: 08/13/16  4:40 AM  Result Value Ref Range   Total Protein 5.8 (L) 6.5 - 8.1 g/dL   Albumin 2.1 (L) 3.5 - 5.0 g/dL   AST 52 (H) 15 - 41 U/L   ALT 53 14 - 54 U/L   Alkaline Phosphatase 71 38 - 126 U/L    Total Bilirubin 0.8 0.3 - 1.2 mg/dL   Bilirubin, Direct 0.2 0.1 - 0.5 mg/dL   Indirect Bilirubin 0.6 0.3 - 0.9 mg/dL  Glucose, capillary     Status: Abnormal   Collection Time: 08/13/16  6:03 AM  Result Value Ref Range   Glucose-Capillary 106 (H) 65 - 99 mg/dL   Comment 1 Capillary Specimen   Glucose, capillary     Status: Abnormal   Collection Time: 08/13/16  7:59 AM  Result Value Ref Range   Glucose-Capillary 149 (H) 65 - 99 mg/dL   Comment 1 Capillary Specimen    Comment 2 Notify RN    Ct Head Wo Contrast  Result Date: 08/12/2016 CLINICAL DATA:  CABG last week.  Right facial droop. EXAM: CT HEAD WITHOUT CONTRAST TECHNIQUE: Contiguous axial images were obtained from the base of the skull through the vertex without intravenous contrast. COMPARISON:  MRI 05/28/2014, CT 05/28/2014 FINDINGS: Brain: Ventricle size normal.  Cerebral volume normal. Patchy hypodensities in the cerebral white matter bilaterally similar to prior studies and most consistent with chronic microvascular ischemia. Negative for intracranial hemorrhage. Negative for acute infarct or mass. Vascular: No hyperdense vessel or unexpected calcification. Skull: Negative Sinuses/Orbits: Negative Other: None IMPRESSION: No acute intracranial abnormality. Chronic white matter changes consistent with microvascular ischemia. Electronically Signed   By: Marlan Palau M.D.   On: 08/12/2016 09:51   Dg Chest Port 1 View  Result Date: 08/12/2016 CLINICAL DATA:  Sore chest post CABG. EXAM: PORTABLE CHEST 1 VIEW COMPARISON:  08/11/2016 FINDINGS: Sternotomy wires and right IJ central venous sheath unchanged. Lungs are hypoinflated demonstrate mild prominence of the perihilar markings with slight interval improvement likely minimal vascular congestion. Stable mild left base opacification likely small effusion with atelectasis. Mild stable cardiomegaly. Remainder of the exam is unchanged. IMPRESSION: Hypoinflation with stable left base  opacification likely small effusion with atelectasis. Mild stable cardiomegaly with possible mild vascular congestion improved. Right IJ central venous sheath unchanged. Electronically Signed   By: Elberta Fortis M.D.   On: 08/12/2016 08:07    Assessment/Plan: Diagnosis: Suspected CVA Labs and images independently reviewed.  Records reviewed and summated above. Stroke: Continue secondary stroke prophylaxis and Risk Factor Modification listed below:   Antiplatelet therapy:  Blood Pressure Management:  Continue current medication with prn's with permisive HTN per primary team Statin Agent: Diabetes management:  Tobacco abuse:   R>L sided hemiparesis: fit for orthosis to prevent contractures (resting hand splint for day, wrist cock up splint at night, PRAFO, etc Motor recovery: Fluoxetine  1. Does the need for close, 24 hr/day medical supervision in concert with the patient's rehab needs make it unreasonable for this patient to be served in a less intensive setting? Yes  2. Co-Morbidities requiring supervision/potential complications: HTN (monitor and provide prns in accordance with increased physical exertion and pain), migraines (ensure pain does not limit therapies), T2DM with lability (Monitor in accordance with exercise and adjust meds as necessary), CAD s/p CABG X 3 (Monitor in accordance with increased physical activity and avoid UE resistance excercises), tachypnea (monitor RR and O2 Sats with increased physical exertion), hypoalbuminemia (maximize nutrition for overall health and wound healing), leukocytosis (cont to monitor for signs and symptoms of infection, further workup if indicated), ABLA (transfuse if necessary to ensure appropriate perfusion for increased activity tolerance), morbid obesity (Body mass index is 39.05 kg/m., diet and exercise education encourage weight loss to increase endurance and promote overall health) 3. Due to bladder management, safety, disease management,  medication administration, pain management and patient education, does the patient require 24 hr/day rehab nursing? Yes 4. Does the patient require coordinated care of a physician, rehab nurse, PT (1-2 hrs/day, 5 days/week), OT (1-2 hrs/day, 5 days/week) and SLP (1-2 hrs/day, 5 days/week) to address physical and functional deficits in the context of the above medical diagnosis(es)? Yes Addressing deficits in the following areas: balance, endurance, locomotion, strength, transferring, bowel/bladder control, bathing, dressing, toileting, cognition, speech and psychosocial support  5. Can the patient actively participate in an intensive therapy program of at least 3 hrs of therapy per day at least 5 days per week? Potentially 6. The potential for patient to make measurable gains while on inpatient rehab is excellent 7. Anticipated functional outcomes upon discharge from inpatient rehab are min assist  with PT, min assist with OT, supervision and min assist with SLP. 8. Estimated rehab length of stay to reach the above functional goals is: 20-25 days. 9. Does the patient have adequate social supports and living environment to accommodate these discharge functional goals? Potentially 10. Anticipated D/C setting: Home 11. Anticipated post D/C treatments: HH therapy and Home excercise program 12. Overall Rehab/Functional Prognosis: good  RECOMMENDATIONS: This patient's condition is appropriate for continued rehabilitative care in the following setting: CIR if adequate caregiver support available on discharge after completion of medical workup and able to tolerate 3 hours therapy/day. Patient has agreed to participate in recommended program. Potentially Note that insurance prior authorization may be required for reimbursement for recommended care.  Comment: Rehab Admissions Coordinator to follow up.   Jerene Pitch 08/13/2016  Maryla Morrow, MD, [place]

## 2016-08-13 NOTE — Progress Notes (Signed)
OT Evaluation  PTA, pt independent with ADL and mobility. Employed.  Pt currently requires mod A +2 with mobility @ RW level and max A with ADL tasks due to below deficits. Excellent family support. Pt very motivated to return to PLOF. Pt will benefit from intensive rehab at CIR to maximize functional level of independence. Will follow acutely to address established goals and facilitate DC to next venue of care.    08/13/16 1024  OT Visit Information  Last OT Received On 08/13/16  Assistance Needed +2  PT/OT/SLP Co-Evaluation/Treatment Yes  Reason for Co-Treatment Complexity of the patient's impairments (multi-system involvement);For patient/therapist safety;To address functional/ADL transfers  OT goals addressed during session ADL's and self-care  History of Present Illness 46 yo s/p CABGx 3 with PNA, pt with post op right weakness with CT (-). PMHx: DM, HTN  Precautions  Precautions Fall;Sternal  Precaution Comments right hemiparesis and inattention  Restrictions  Weight Bearing Restrictions (Sternal Precautions)  Other Position/Activity Restrictions sternal precautions  Home Living  Family/patient expects to be discharged to: Private residence  Living Arrangements Spouse/significant other  Available Help at Discharge Family;Available 24 hours/day  Type of Home House  Home Layout One level  Home Equipment None  Additional Comments pt lives with spouse who works but mom can assist at DC  Prior Function  Level of Independence Independent  Communication  Communication No difficulties  Pain Assessment  Pain Assessment 0-10  Pain Score 8  Pain Location chest  Pain Descriptors / Indicators Pressure  Pain Intervention(s) Limited activity within patient's tolerance  Cognition  Arousal/Alertness Awake/alert  Behavior During Therapy Flat affect  Overall Cognitive Status Impaired/Different from baseline  Area of Impairment Orientation;Attention;Memory;Following commands;Problem  solving;Safety/judgement;Awareness  Orientation Level Disoriented to;Time  Current Attention Level Sustained  Memory Decreased short-term memory;Decreased recall of precautions  Following Commands Follows one step commands with increased time  Safety/Judgement Decreased awareness of safety;Decreased awareness of deficits  Awareness Intellectual  Problem Solving Slow processing;Decreased initiation;Difficulty sequencing;Requires verbal cues;Requires tactile cues  General Comments pt able to recall 3/5 precautions  Upper Extremity Assessment  Upper Extremity Assessment RUE deficits/detail  RUE Deficits / Details Pt with isolated RUE movement adn ableto use RUE as gross assist wtih mobiliyt.Ableto actively lift RUE off bed to @ 30 degrees. Poor functional grasp.unable to oppose digits wtih thumb. Unable to feed self with R hand. Drucie Ip stage V arm and  IV hand  RUE Sensation decreased light touch ("feels numb")  LUE Deficits / Details grossly WFL at least 3/5  Lower Extremity Assessment  RLE Deficits / Details 2/5 knee extension, 2/5 hip ABdct/ADD, 1/5 knee flexion  LLE Deficits / Details grossly WFL at least 3/5  Cervical / Trunk Assessment  Cervical / Trunk Assessment Other exceptions (L bias)  ADL  Overall ADL's  Needs assistance/impaired  Eating/Feeding Minimal assistance  Grooming Moderate assistance  Upper Body Bathing Moderate assistance  Lower Body Bathing Maximal assistance  Upper Body Dressing  Maximal assistance  Lower Body Dressing Maximal assistance  Toilet Transfer +2 for physical assistance;Moderate assistance  Toileting- Clothing Manipulation and Hygiene Maximal assistance;Sitting/lateral lean  Functional mobility during ADLs Moderate assistance;+2 for physical assistance  Vision- History  Baseline Vision/History Wears glasses  Vision- Assessment  Additional Comments decreased visaul attention. Will further assess   Perception  Comments decreased awareness of R  side  Praxis  Praxis-Other Comments delay in processing. will further assess  Bed Mobility  Overal bed mobility Needs Assistance  Bed Mobility Rolling;Sidelying to Sit;Sit  to Supine  Rolling Mod assist  Sidelying to sit Mod assist;+2 for physical assistance  Sit to supine Mod assist  Sit to sidelying Max assist  General bed mobility comments max cues and assist to bend knee and roll right, assist to elevate trunk from surface as well as bring RLE off of bed. With return to bed assist to bring RLE onto bed and control trunk to surface  Transfers  Overall transfer level Needs assistance  Equipment used None  Transfers Sit to/from Stand;Stand Pivot Transfers  Sit to Stand Mod assist;+2 physical assistance  Stand pivot transfers Mod assist;+2 physical assistance;+2 safety/equipment  General transfer comment mod assist with cues for hand placement, sequence, posture and safety with initial knee instability in standing. pt transferred bed >BSC>walked>chair>bed.   Balance  Overall balance assessment Needs assistance  Sitting-balance support Bilateral upper extremity supported;Feet supported  Sitting balance-Leahy Scale Poor  Sitting balance - Comments EOB grossly 4 min with min-mod assist for balance due to left lean. On BSC pt leaning left onto LUE and aware of not being at midline but unable to correct  Postural control Left lateral lean  Standing balance-Leahy Scale Poor  OT - End of Session  Equipment Utilized During Treatment Gait belt;Rolling walker  Activity Tolerance Patient tolerated treatment well  Patient left in bed;with call bell/phone within reach;with family/visitor present;with nursing/sitter in room  Nurse Communication Mobility status  OT Assessment  OT Recommendation/Assessment Patient needs continued OT Services  OT Problem List Decreased strength;Decreased range of motion;Decreased activity tolerance;Impaired balance (sitting and/or standing);Impaired  vision/perception;Decreased coordination;Decreased cognition;Decreased safety awareness;Decreased knowledge of use of DME or AE;Decreased knowledge of precautions;Cardiopulmonary status limiting activity;Impaired sensation;Obesity;Impaired UE functional use;Pain;Impaired tone;Increased edema  OT Plan  OT Frequency (ACUTE ONLY) Min 3X/week  OT Treatment/Interventions (ACUTE ONLY) Self-care/ADL training;Therapeutic exercise;Neuromuscular education;Energy conservation;DME and/or AE instruction;Therapeutic activities;Cognitive remediation/compensation;Visual/perceptual remediation/compensation;Patient/family education;Balance training  OT Recommendation  Recommendations for Other Services Rehab consult  Follow Up Recommendations CIR;Supervision/Assistance - 24 hour  OT Equipment 3 in 1 bedside commode;Tub/shower bench  Individuals Consulted  Consulted and Agree with Results and Recommendations Patient;Family member/caregiver  Family Member Consulted husband  Acute Rehab OT Goals  Patient Stated Goal to be able to take care of myself  OT Goal Formulation With patient/family  Time For Goal Achievement 08/27/16  Potential to Achieve Goals Good  OT Time Calculation  OT Start Time (ACUTE ONLY) 0905  OT Stop Time (ACUTE ONLY) 0929  OT Time Calculation (min) 24 min  OT General Charges  $OT Visit 1 Procedure  OT Evaluation  $OT Eval Moderate Complexity 1 Procedure  Written Expression  Dominant Hand Right  Luisa DagoHilary Koralynn Greenspan, Mitchel HonourOTRL  [phone] 08/13/2016

## 2016-08-13 NOTE — Plan of Care (Signed)
0930 to CT via bed and monitor  1010 from CT same fashion

## 2016-08-13 NOTE — Progress Notes (Signed)
Physical Therapy Treatment Patient Details Name: [name] MRN: [id number] DOB: 07/12/1971 Today's Date: 08/13/2016    History of Present Illness 46 yo s/p CABGx 3 with PNA, pt with post op right weakness with CT (-). PMHx: DM, HTN    PT Comments    Pt with improved strength RUE and RLE today with hip flexion 1/5, knee extension 2+/5, improved balance and ability to walk limited distance with 2 person assist and chair to follow. Pt educated for sternal precautions as she is unable to recall yesterday's session. Pt continues to be motivated with supportive family present throughout session today. Will continue to follow and recommend CIR.  HR 99-104 BP 162/81 sitting sats 90-95% on RA  Follow Up Recommendations  CIR;Supervision/Assistance - 24 hour     Equipment Recommendations       Recommendations for Other Services       Precautions / Restrictions Precautions Precautions: Fall;Sternal Precaution Comments: right hemiparesis and inattention    Mobility  Bed Mobility Overal bed mobility: Needs Assistance Bed Mobility: Rolling;Sidelying to Sit;Sit to Supine Rolling: Mod assist Sidelying to sit: Mod assist;+2 for physical assistance   Sit to supine: Mod assist   General bed mobility comments: max cues and assist to bend knee and roll right, assist to elevate trunk from surface as well as bring RLE off of bed. With return to bed assist to bring RLE onto bed and control trunk to surface  Transfers Overall transfer level: Needs assistance Equipment used: None Transfers: Sit to/from UGI CorporationStand;Stand Pivot Transfers Sit to Stand: Mod assist;+2 physical assistance Stand pivot transfers: Mod assist;+2 physical assistance;+2 safety/equipment       General transfer comment: mod assist with cues for hand placement, sequence, posture and safety with initial knee instability in standing. pt transferred bed >BSC>walked>chair>bed.   Ambulation/Gait Ambulation/Gait  assistance: Mod assist;+2 physical assistance;+2 safety/equipment Ambulation Distance (Feet): 18 Feet Assistive device: Rolling walker (2 wheeled) Gait Pattern/deviations: Step-to pattern;Decreased stance time - right;Decreased weight shift to right   Gait velocity interpretation: Below normal speed for age/gender General Gait Details: pt with cues for step sequence, RUE placement with assist to position RUE on RW, increased right hip weakness with progressive gait and progressive lean right, chair to follow for safety.    Stairs            Wheelchair Mobility    Modified Rankin (Stroke Patients Only) Modified Rankin (Stroke Patients Only) Pre-Morbid Rankin Score: No symptoms Modified Rankin: Moderately severe disability     Balance Overall balance assessment: Needs assistance Sitting-balance support: Bilateral upper extremity supported;Feet supported Sitting balance-Leahy Scale: Poor   Postural control: Left lateral lean   Standing balance-Leahy Scale: Poor                      Cognition Arousal/Alertness: Awake/alert Behavior During Therapy: Flat affect Overall Cognitive Status: Impaired/Different from baseline   Orientation Level: Disoriented to;Time Current Attention Level: Sustained Memory: Decreased short-term memory;Decreased recall of precautions Following Commands: Follows one step commands with increased time       General Comments: pt able to recall 3/5 precautions    Exercises      General Comments        Pertinent Vitals/Pain Pain Assessment: 0-10 Pain Score: 8  Pain Location: chest Pain Descriptors / Indicators: Pressure Pain Intervention(s): Limited activity within patient's tolerance;Repositioned;Monitored during session    Home Living  Prior Function            PT Goals (current goals can now be found in the care plan section) Progress towards PT goals: Progressing toward goals     Frequency           PT Plan Current plan remains appropriate    Co-evaluation PT/OT/SLP Co-Evaluation/Treatment: Yes Reason for Co-Treatment: Complexity of the patient's impairments (multi-system involvement);For patient/therapist safety PT goals addressed during session: Mobility/safety with mobility;Balance;Proper use of DME       End of Session Equipment Utilized During Treatment: Gait belt Activity Tolerance: Patient tolerated treatment well Patient left: in bed;with call bell/phone within reach;with nursing/sitter in room;with family/visitor present     Time: 8295-6213 PT Time Calculation (min) (ACUTE ONLY): 35 min  Charges:  $Gait Training: 8-22 mins                 .    G Codes:      [name] Aug 18, 2016, 10:26 AM  Delaney Meigs, PT [phone]

## 2016-08-13 NOTE — Progress Notes (Signed)
Peripherally Inserted Central Catheter/Midline Placement  The IV Nurse has discussed with the patient and/or persons authorized to consent for the patient, the purpose of this procedure and the potential benefits and risks involved with this procedure.  The benefits include less needle sticks, lab draws from the catheter, and the patient may be discharged home with the catheter. Risks include, but not limited to, infection, bleeding, blood clot (thrombus formation), and puncture of an artery; nerve damage and irregular heartbeat and possibility to perform a PICC exchange if needed/ordered by physician.  Alternatives to this procedure were also discussed.  Bard Power PICC patient education guide, fact sheet on infection prevention and patient information card has been provided to patient /or left at bedside.    PICC/Midline Placement Documentation        Lindsey GlazeJoyce, [name] 08/13/2016, 2:33 PM

## 2016-08-13 NOTE — Progress Notes (Signed)
STROKE TEAM PROGRESS NOTE   HISTORY OF PRESENT ILLNESS (per record) [name] is an 46 y.o. female admitted with chest pain and is now s/p CABG.  Over the last 1-2d there has been some concern about the development of right sided weakness and neglect.  She has no prior h/o stroke.  She had a CT head non-con which was negative.  She did not get contrast and did not have a CTA of the head and neck.  She was not taking ASA or statin at home.  She has no prior h/o stroke. Her last known well is unable to be determined. Patient was not administered IV t-PA secondary to post op status, unknown LKW.    SUBJECTIVE (INTERVAL HISTORY) Husband is at bedside. Pt right sided weakness improved from yesterday as per nurse. She walked about 10 steps in hallway with walker for PT/OT this am. Had CTA head and neck showed stenosis of BA, left VA occlusion with intermittent reconstitution, and likely small embolus at proximal BA. Discussed with Dr. Tyrone Sage, since pt clinically doing well, will put on DAPT and continue observation.    OBJECTIVE Temp:  [98.3 F (36.8 C)-100.7 F (38.2 C)] 98.3 F (36.8 C) (01/16 0000) Pulse Rate:  [85-103] 98 (01/16 0800) Cardiac Rhythm: Normal sinus rhythm (01/16 0752) Resp:  [21-36] 30 (01/16 0800) BP: (113-167)/(67-103) 143/89 (01/16 0800) SpO2:  [87 %-100 %] 97 % (01/16 0800) Weight:  [87.7 kg (193 lb 5.5 oz)] 87.7 kg (193 lb 5.5 oz) (01/16 0500)  CBC:  Recent Labs Lab 08/12/16 0428 08/13/16 0440  WBC 19.5* 14.7*  HGB 9.2* 9.0*  HCT 28.4* 27.6*  MCV 91.9 92.0  PLT 246 318    Basic Metabolic Panel:  Recent Labs Lab 08/10/16 0439  08/10/16 1607  08/12/16 0428 08/13/16 0440  NA 139  < >  --   < > 137 137  K 3.8  < >  --   < > 3.8 3.6  CL 111  < >  --   < > 102 101  CO2 23  --   --   < > 28 29  GLUCOSE 107*  < >  --   < > 102* 67  BUN 11  < >  --   < > 19 17  CREATININE 0.70  < > 0.77  < > 0.68 0.57  CALCIUM 7.6*  --   --   < > 8.1* 8.0*  MG  2.7*  --  2.3  --   --   --   < > = values in this interval not displayed.  Lipid Panel:    Component Value Date/Time   CHOL 120 08/08/2016 0304   TRIG 89 08/08/2016 0304   HDL 35 (L) 08/08/2016 0304   CHOLHDL 3.4 08/08/2016 0304   VLDL 18 08/08/2016 0304   LDLCALC 67 08/08/2016 0304   HgbA1c:  Lab Results  Component Value Date   HGBA1C 13.1 (H) 08/08/2016   Urine Drug Screen:    Component Value Date/Time   LABOPIA NONE DETECTED 05/28/2014 0951   COCAINSCRNUR NONE DETECTED 05/28/2014 0951   LABBENZ NONE DETECTED 05/28/2014 0951   AMPHETMU NONE DETECTED 05/28/2014 0951   THCU NONE DETECTED 05/28/2014 0951   LABBARB NONE DETECTED 05/28/2014 0951      IMAGING I have personally reviewed the radiological images below and agree with the radiology interpretations.  Ct Head Wo Contrast 08/12/2016 No acute intracranial abnormality. Chronic white matter changes consistent with microvascular ischemia.  TEE 1/12/21083 -Left ventricle: Normal cavity size. Concentric hypertrophy of severe severity. LV systolic function is normal with an EF of 60-65%. There are no obvious wall motion abnormalities. No thrombus present. No mass present. -Mitral valve: No leaflet thickening and calcification present. Mild mitral annular calcification. Trace regurgitation. -Right ventricle: Normal cavity size, wall thickness and ejection fraction. -Tricuspid valve: Trace regurgitation. The tricuspid valve regurgitation jet is central.  Ct Angio head and Neck W Or Wo Contrast 08/13/2016 IMPRESSION: 1. Small infarcts in the left pons, bilateral cerebellum, and right cerebral white matter. These are age indeterminate but new from 2 and concerning for recent infarcts given the clinical circumstances. 2. Small filling defect in the proximal basilar concerning for embolus. No related high-grade stenosis. There is a background of advanced posterior circulation atherosclerosis with high-grade tandem left  vertebral stenoses and moderate right vertebral and basilar stenoses. 3. Moderate stenosis in the bilateral carotid siphons. 4. 40% right ICA bulb stenosis from atherosclerotic plaque. 5. Recent CABG. No acute finding or embolic source along the arch or proximal great vessels. 6. Layering pleural effusions.   Dg Chest Port 1 View 08/13/2016 IMPRESSION: Evidence a degree of congestive heart failure. Consolidation left lower lobe may well represent atelectasis, although pneumonia superimposed in this area must be of concern. There is left midlung atelectasis which is stable. No pneumothorax. Aortic atherosclerosis.   LE venous doppler  Negative for deep and superficial vein thrombosis in both legs.    PHYSICAL EXAM  Temp:  [96.4 F (35.8 C)-100.7 F (38.2 C)] 99.5 F (37.5 C) (01/16 1200) Pulse Rate:  [87-103] 94 (01/16 1400) Resp:  [19-32] 32 (01/16 1400) BP: (94-167)/(67-123) 94/74 (01/16 1400) SpO2:  [87 %-100 %] 100 % (01/16 1400) Weight:  [193 lb 5.5 oz (87.7 kg)] 193 lb 5.5 oz (87.7 kg) (01/16 0500)  General - Well nourished, well developed, in no apparent distress, mild lethargy.  Ophthalmologic - ShFundi not visualized due to noncooperation.  Cardiovascular - Regular rate and rhythm.  Mental Status -  Level of arousal and orientation to time, place, and person were intact. Language including expression, naming, repetition, comprehension was assessed and found intact. Fund of Knowledge was assessed and was intact.  Cranial Nerves II - XII - II - Visual field intact OU. III, IV, VI - Extraocular movements intact. V - Facial sensation decreased on the right , about 50% of the left. VII - Facial movement intact bilaterally. VIII - Hearing & vestibular intact bilaterally. X - Palate elevates symmetrically. XI - Chin turning & shoulder shrug intact bilaterally. XII - Tongue protrusion intact.  Motor Strength - The patient's strength was 5/5 LUE and LLE, 3+/5 RUE and RLE.   Bulk was normal and fasciculations were absent.   Motor Tone - Muscle tone was assessed at the neck and appendages and was normal.  Reflexes - The patient's reflexes were 1+ in all extremities and she had no pathological reflexes.  Sensory - Light touch, temperature/pinprick were assessed and were symmetrical.    Coordination - The patient had normal movements in the left hand with no ataxia or dysmetria.  Tremor was absent.  Gait and Station - with 2 person assistance, pt walk with walker for 10 steps, significant right hemiparetic gait.   ASSESSMENT/PLAN Ms. [name] is a 46 y.o. female with history of HTN and DB who was admitted for CP s/p CABG this admission who developed R side weakness and neglect s/p CABG. She did not receive IV t-PA due  to unclear LKW, post op status.   Stroke:  Likely L brain as well as posterior circulation infarcts, embolic pattern. CTA with likely proximal BA thrombus, could be related to s/p CABG.  Resultant  R side hemiparesis and hemiparesthesia  CT head no acute abnormality  CTA head and neck  Proximal BA thrombus, BA, left VA stenosis. Remote pontine and cerebellar infarcts.  MRI  Not done due to recent CABG and sternum wire  TEE done intraoperatively, no PFO, obvious SOE at that time    LE doppler negative for DVT  LDL 67  HgbA1c 13.1  Lovenox 40 mg sq daily for VTE prophylaxis  Diet full liquid Room service appropriate? Yes with Assist; Fluid consistency: Thin  No antithrombotic prior to admission, now on aspirin 325 mg daily. CTA concerning for BA thrombus, however, pt clinically doing very well. Discussed with Dr. Tyrone SageGerhardt, will put pt on DAPT with plavix load, and close neuro check.   Patient counseled to be compliant with her antithrombotic medications  Ongoing aggressive stroke risk factor management  Therapy recommendations:  CIR  Disposition:  pending   CAD s/p CABG  Admitted for unstable angina, SOB  Cardiac  cath found severe CAD  S/p CABG  On DAPT now  Hypertension  Stable  Permissive hypertension (OK if < 180/105) but gradually normalize in 5-7 days  Long-term BP goal normotensive  Hyperlipidemia  Home meds:  No statin  LDL 67, goal < 70  Now on lipitor 80 mg daily due to BA thrombus  Continue statin at discharge  Diabetes type II  HgbA1c 13.1, goal < 7.0  Uncontrolled  SSI  On levemir  DM nurse following  Tobacco abuse  Current smoker  Smoking cessation counseling provided  Pt is willing to quit  Other Stroke Risk Factors  Obesity, Body mass index is 39.05 kg/m., recommend weight loss, diet and exercise as appropriate   [facility] IncMigraines  Hospital day # 6  Marvel PlanJindong Conchetta Lamia, MD PhD Stroke Neurology 08/13/2016 2:58 PM   To contact Stroke Continuity provider, please refer to [email or web address]. After hours, contact General Neurology

## 2016-08-13 NOTE — Progress Notes (Signed)
Patient ID: [name], female   DOB: 12-29-1970, 46 y.o.   MRN: [id number] TCTS DAILY ICU PROGRESS NOTE                   [address]            [place] [address]          [phone]   4 Days Post-Op Procedure(s) (LRB): CORONARY ARTERY BYPASS GRAFTING (CABG) x three,SVG to distal circ OM3, SVG to Om1, LIMA to LAD-using left internal mammary artery and right leg greater saphenous vein harvested endoscopically, Sternal plating - right side (N/A) TRANSESOPHAGEAL ECHOCARDIOGRAM (TEE) (N/A)  Total Length of Stay:  LOS: 6 days   Subjective: Up in chair, stood with help  Objective: Vital signs in last 24 hours: Temp:  [98.3 F (36.8 C)-100.7 F (38.2 C)] 98.3 F (36.8 C) (01/16 0000) Pulse Rate:  [85-103] 93 (01/16 0700) Cardiac Rhythm: Normal sinus rhythm (01/16 0752) Resp:  [21-36] 26 (01/16 0700) BP: (113-167)/(67-103) 149/77 (01/16 0700) SpO2:  [87 %-100 %] 94 % (01/16 0700) Weight:  [193 lb 5.5 oz (87.7 kg)] 193 lb 5.5 oz (87.7 kg) (01/16 0500)  Filed Weights   08/11/16 0600 08/12/16 0600 08/13/16 0500  Weight: 192 lb 3.9 oz (87.2 kg) 186 lb 1.1 oz (84.4 kg) 193 lb 5.5 oz (87.7 kg)    Weight change: 7 lb 4.4 oz (3.3 kg)   Hemodynamic parameters for last 24 hours:    Intake/Output from previous day: 01/15 0701 - 01/16 0700 In: 1470 [P.O.:1140; I.V.:180; IV Piggyback:150] Out: 2696 [Urine:2695; Stool:1]  Intake/Output this shift: No intake/output data recorded.  Current Meds: Scheduled Meds: . aspirin EC  325 mg Oral Daily   Or  . aspirin  324 mg Per Tube Daily  . atorvastatin  40 mg Oral q1800  . bisacodyl  10 mg Oral Daily   Or  . bisacodyl  10 mg Rectal Daily  . ceFEPime (MAXIPIME) IV  2 g Intravenous Q8H  . docusate sodium  200 mg Oral Daily  . enoxaparin (LOVENOX) injection  40 mg Subcutaneous QHS  . furosemide  80 mg Intravenous BID  . insulin aspart  0-24 Units Subcutaneous Q4H  . insulin aspart  6 Units Subcutaneous TID  WC  . insulin detemir  30 Units Subcutaneous Daily  . metoCLOPramide (REGLAN) injection  10 mg Intravenous Q6H  . metoprolol tartrate  12.5 mg Oral BID   Or  . metoprolol tartrate  12.5 mg Per Tube BID  . pantoprazole  40 mg Oral Daily  . potassium chloride  20 mEq Oral BID  . sodium chloride flush  3 mL Intravenous Q12H   Continuous Infusions: . sodium chloride Stopped (08/10/16 1501)  . sodium chloride    . sodium chloride    . lactated ringers    . lactated ringers 10 mL/hr at 08/13/16 0700  . nitroGLYCERIN Stopped (08/09/16 2030)   PRN Meds:.sodium chloride, lactated ringers, metoprolol, ondansetron (ZOFRAN) IV, sodium chloride flush, traMADol  General appearance: alert, cooperative, appears older than stated age and distracted Neurologic: moving right side Vega  Heart: regular rate and rhythm, S1, S2 normal, no murmur, click, rub or gallop Lungs: diminished breath sounds bibasilar Abdomen: soft, non-tender; bowel sounds normal; no masses,  no organomegaly Extremities: extremities normal, atraumatic, no cyanosis or edema and Homans sign is negative, no sign of DVT Wound: sternum stable  Lab Results: CBC:  Recent Labs  08/12/16 0428 08/13/16 0440  WBC  19.5* 14.7*  HGB 9.2* 9.0*  HCT 28.4* 27.6*  PLT 246 318   BMET:   Recent Labs  08/12/16 0428 08/13/16 0440  NA 137 137  K 3.8 3.6  CL 102 101  CO2 28 29  GLUCOSE 102* 67  BUN 19 17  CREATININE 0.68 0.57  CALCIUM 8.1* 8.0*    CMET: Lab Results  Component Value Date   WBC 14.7 (H) 08/13/2016   HGB 9.0 (L) 08/13/2016   HCT 27.6 (L) 08/13/2016   PLT 318 08/13/2016   GLUCOSE 67 08/13/2016   CHOL 120 08/08/2016   TRIG 89 08/08/2016   HDL 35 (L) 08/08/2016   LDLCALC 67 08/08/2016   ALT 53 08/13/2016   AST 52 (H) 08/13/2016   NA 137 08/13/2016   K 3.6 08/13/2016   CL 101 08/13/2016   CREATININE 0.57 08/13/2016   BUN 17 08/13/2016   CO2 29 08/13/2016   INR 1.53 08/09/2016   HGBA1C 13.1 (H)  08/08/2016      PT/INR: No results for input(s): LABPROT, INR in the last 72 hours. Radiology: Ct Head Wo Contrast  Result Date: 08/12/2016 CLINICAL DATA:  CABG last week.  Right facial droop. EXAM: CT HEAD WITHOUT CONTRAST TECHNIQUE: Contiguous axial images were obtained from the base of the skull through the vertex without intravenous contrast. COMPARISON:  MRI 05/28/2014, CT 05/28/2014 FINDINGS: Brain: Ventricle size normal.  Cerebral volume normal. Patchy hypodensities in the cerebral white matter bilaterally similar to prior studies and most consistent with chronic microvascular ischemia. Negative for intracranial hemorrhage. Negative for acute infarct or mass. Vascular: No hyperdense vessel or unexpected calcification. Skull: Negative Sinuses/Orbits: Negative Other: None IMPRESSION: No acute intracranial abnormality. Chronic white matter changes consistent with microvascular ischemia. Electronically Signed   By: Marlan Palauharles  Clark M.D.   On: 08/12/2016 09:51     Assessment/Plan: S/P Procedure(s) (LRB): CORONARY ARTERY BYPASS GRAFTING (CABG) x three,SVG to distal circ OM3, SVG to Om1, LIMA to LAD-using left internal mammary artery and right leg greater saphenous vein harvested endoscopically, Sternal plating - right side (N/A) TRANSESOPHAGEAL ECHOCARDIOGRAM (TEE) (N/A) Mobilize Diuresis Ct angio of neck, brain Place pic poor vascular acess    Delight Ovensdward B Bayley [place] 08/13/2016 8:04 AM

## 2016-08-13 NOTE — Progress Notes (Signed)
Inpatient Diabetes Program Recommendations  AACE/ADA: New Consensus Statement on Inpatient Glycemic Control (2015)  Target Ranges:  Prepandial:   less than 140 mg/dL      Peak postprandial:   less than 180 mg/dL (1-2 hours)      Critically ill patients:  140 - 180 mg/dL   Lab Results  Component Value Date   GLUCAP 131 (H) 08/13/2016   HGBA1C 13.1 (H) 08/08/2016    Review of Glycemic ControlResults for Lindsey GripCHERRY-[name], [name] (MRN [id number]) as of 08/13/2016 12:43  Ref. Range 08/12/2016 23:54 08/13/2016 04:31 08/13/2016 06:03 08/13/2016 07:59 08/13/2016 12:34  Glucose-Capillary Latest Ref Range: 65 - 99 mg/dL 782133 (H) 62 (L) 956106 (H) 149 (H) 131 (H)   Inpatient Diabetes Program Recommendations:    Please consider reducing Novolog correction to moderate tid with meals and HS instead of q 4 hours.    Thanks, Beryl MeagerJenny Jomel Whittlesey, RN, BC-ADM Inpatient Diabetes Coordinator Pager [phone] (8a-5p)

## 2016-08-13 NOTE — Progress Notes (Signed)
Preliminary results by tech - Venous Duplex Lower Ext. Completed. Negative for deep and superficial vein thrombosis in both legs.  Daman Steffenhagen, BS, RDMS, RVT  

## 2016-08-14 ENCOUNTER — Inpatient Hospital Stay (HOSPITAL_COMMUNITY): Payer: BLUE CROSS/BLUE SHIELD

## 2016-08-14 DIAGNOSIS — I6312 Cerebral infarction due to embolism of basilar artery: Secondary | ICD-10-CM

## 2016-08-14 LAB — GLUCOSE, CAPILLARY
GLUCOSE-CAPILLARY: 184 mg/dL — AB (ref 65–99)
GLUCOSE-CAPILLARY: 121 mg/dL — AB (ref 65–99)
GLUCOSE-CAPILLARY: 75 mg/dL (ref 65–99)
Glucose-Capillary: 74 mg/dL (ref 65–99)
Glucose-Capillary: 143 mg/dL — ABNORMAL HIGH (ref 65–99)

## 2016-08-14 LAB — CBC
HCT: 29.4 % — ABNORMAL LOW (ref 36.0–46.0)
Hemoglobin: 9.5 g/dL — ABNORMAL LOW (ref 12.0–15.0)
MCH: 30 pg (ref 26.0–34.0)
MCHC: 32.3 g/dL (ref 30.0–36.0)
MCV: 92.7 fL (ref 78.0–100.0)
Platelets: 424 10*3/uL — ABNORMAL HIGH (ref 150–400)
RBC: 3.17 MIL/uL — ABNORMAL LOW (ref 3.87–5.11)
RDW: 14.8 % (ref 11.5–15.5)
WBC: 12.6 10*3/uL — ABNORMAL HIGH (ref 4.0–10.5)

## 2016-08-14 LAB — BASIC METABOLIC PANEL
Anion gap: 8 (ref 5–15)
BUN: 11 mg/dL (ref 6–20)
CO2: 31 mmol/L (ref 22–32)
Calcium: 8.4 mg/dL — ABNORMAL LOW (ref 8.9–10.3)
Chloride: 99 mmol/L — ABNORMAL LOW (ref 101–111)
Creatinine, Ser: 0.51 mg/dL (ref 0.44–1.00)
GFR calc Af Amer: >60 mL/min (ref >60)
GFR calc non Af Amer: >60 mL/min (ref >60)
Glucose, Bld: 89 mg/dL (ref 65–99)
Potassium: 3.6 mmol/L (ref 3.5–5.1)
Sodium: 138 mmol/L (ref 135–145)

## 2016-08-14 MED ORDER — STROKE: EARLY STAGES OF RECOVERY BOOK
Freq: Once | Status: AC
  Administered 2016-08-14: 10:00:00
  Filled 2016-08-14: qty 1

## 2016-08-14 MED ORDER — IOPAMIDOL (ISOVUE-370) INJECTION 76%
50.0000 mL | Freq: Once | INTRAVENOUS | Status: AC | PRN
Start: 2016-08-14 — End: 2016-08-14
  Administered 2016-08-14: 50 mL via INTRAVENOUS

## 2016-08-14 MED ORDER — IOPAMIDOL (ISOVUE-370) INJECTION 76%
INTRAVENOUS | Status: AC
  Filled 2016-08-14: qty 100

## 2016-08-14 MED ORDER — INSULIN ASPART 100 UNIT/ML ~~LOC~~ SOLN
0.0000 [IU] | Freq: Three times a day (TID) | SUBCUTANEOUS | Status: DC
  Administered 2016-08-15: 4 [IU] via SUBCUTANEOUS
  Administered 2016-08-15: 8 [IU] via SUBCUTANEOUS

## 2016-08-14 MED ORDER — INSULIN ASPART 100 UNIT/ML ~~LOC~~ SOLN
0.0000 [IU] | SUBCUTANEOUS | Status: DC
  Administered 2016-08-14 (×2): 4 [IU] via SUBCUTANEOUS

## 2016-08-14 MED ORDER — SODIUM CHLORIDE 0.9 % IV SOLN
30.0000 meq | Freq: Once | INTRAVENOUS | Status: DC
  Filled 2016-08-14: qty 15

## 2016-08-14 NOTE — Progress Notes (Signed)
Inpatient Rehabilitation  I met with the patient, her husband and her mother at the bedside to discuss the recommendation for IP rehab.  I provided informational booklets and answered all their questions.  Family confirms that 24 hour care will be available following a CIR admission.  I will initiate insurance authorization for a potential admission when medically ready pending insurance approval and bed availability.  Please call if questions.  [facility] Admissions Coordinator Cell [phone] Office [phone]

## 2016-08-14 NOTE — Progress Notes (Signed)
CT surgery p.m. Rounds  Patient currently resting comfortably in bed with stable blood pressure on 2 L oxygen, off drips Suboptimal oral intake today but no difficulty demonstrated with swallowing She did not ambulate today Remains with right-sided weakness Patient was seen by neurology this afternoon for possible change in facial droop but subsequent CT scan of head showed no worsening. Close observation has been recommended. Continue Plavix and prophylactic dose Lovenox

## 2016-08-14 NOTE — Progress Notes (Signed)
Pt with increased Right facial droop, decreased movement RLE, MD Roda ShuttersXu updated

## 2016-08-14 NOTE — Progress Notes (Signed)
   Patient unavailable (sleeping).  Will follow-up.  - Rev. Chaplain Kipp Broodnthony Zachory Mangual MDiv ThM

## 2016-08-14 NOTE — Progress Notes (Signed)
STROKE TEAM PROGRESS NOTE   SUBJECTIVE (INTERVAL HISTORY) Husband walked into room during rounds. Pt sitting in chair still has right sided weakness. No acute event overnight. On DAPT and high dose lipitor.    OBJECTIVE Temp:  [97.6 F (36.4 C)-99.5 F (37.5 C)] 97.6 F (36.4 C) (01/17 0900) Pulse Rate:  [84-97] 95 (01/17 0800) Cardiac Rhythm: Normal sinus rhythm (01/17 0800) Resp:  [20-32] 20 (01/17 0800) BP: (94-160)/(73-111) 155/95 (01/17 0800) SpO2:  [92 %-100 %] 100 % (01/17 0800) Weight:  [178 lb 9.2 oz (81 kg)] 178 lb 9.2 oz (81 kg) (01/17 0500)  CBC:   Recent Labs Lab 08/13/16 0440 08/14/16 0500  WBC 14.7* 12.6*  HGB 9.0* 9.5*  HCT 27.6* 29.4*  MCV 92.0 92.7  PLT 318 424*    Basic Metabolic Panel:  Recent Labs Lab 08/10/16 0439  08/10/16 1607  08/13/16 0440 08/14/16 0500  NA 139  < >  --   < > 137 138  K 3.8  < >  --   < > 3.6 3.6  CL 111  < >  --   < > 101 99*  CO2 23  --   --   < > 29 31  GLUCOSE 107*  < >  --   < > 67 89  BUN 11  < >  --   < > 17 11  CREATININE 0.70  < > 0.77  < > 0.57 0.51  CALCIUM 7.6*  --   --   < > 8.0* 8.4*  MG 2.7*  --  2.3  --   --   --   < > = values in this interval not displayed.  Lipid Panel:     Component Value Date/Time   CHOL 120 08/08/2016 0304   TRIG 89 08/08/2016 0304   HDL 35 (L) 08/08/2016 0304   CHOLHDL 3.4 08/08/2016 0304   VLDL 18 08/08/2016 0304   LDLCALC 67 08/08/2016 0304   HgbA1c:  Lab Results  Component Value Date   HGBA1C 13.1 (H) 08/08/2016   Urine Drug Screen:     Component Value Date/Time   LABOPIA NONE DETECTED 05/28/2014 0951   COCAINSCRNUR NONE DETECTED 05/28/2014 0951   LABBENZ NONE DETECTED 05/28/2014 0951   AMPHETMU NONE DETECTED 05/28/2014 0951   THCU NONE DETECTED 05/28/2014 0951   LABBARB NONE DETECTED 05/28/2014 0951      IMAGING I have personally reviewed the radiological images below and agree with the radiology interpretations.  Ct Head Wo Contrast 08/12/2016 No  acute intracranial abnormality. Chronic white matter changes consistent with microvascular ischemia.   TEE 1/12/21083 -Left ventricle: Normal cavity size. Concentric hypertrophy of severe severity. LV systolic function is normal with an EF of 60-65%. There are no obvious wall motion abnormalities. No thrombus present. No mass present. -Mitral valve: No leaflet thickening and calcification present. Mild mitral annular calcification. Trace regurgitation. -Right ventricle: Normal cavity size, wall thickness and ejection fraction. -Tricuspid valve: Trace regurgitation. The tricuspid valve regurgitation jet is central.  Ct Angio head and Neck W Or Wo Contrast 08/13/2016 IMPRESSION: 1. Small infarcts in the left pons, bilateral cerebellum, and right cerebral white matter. These are age indeterminate but new from 41 and concerning for recent infarcts given the clinical circumstances. 2. Small filling defect in the proximal basilar concerning for embolus. No related high-grade stenosis. There is a background of advanced posterior circulation atherosclerosis with high-grade tandem left vertebral stenoses and moderate right vertebral and basilar stenoses. 3. Moderate  stenosis in the bilateral carotid siphons. 4. 40% right ICA bulb stenosis from atherosclerotic plaque. 5. Recent CABG. No acute finding or embolic source along the arch or proximal great vessels. 6. Layering pleural effusions.   Dg Chest Port 1 View 08/13/2016 IMPRESSION: Evidence a degree of congestive heart failure. Consolidation left lower lobe may well represent atelectasis, although pneumonia superimposed in this area must be of concern. There is left midlung atelectasis which is stable. No pneumothorax. Aortic atherosclerosis.   LE venous doppler  Negative for deep and superficial vein thrombosis in both legs.    PHYSICAL EXAM  Temp:  [97.6 F (36.4 C)-99.5 F (37.5 C)] 97.6 F (36.4 C) (01/17 0900) Pulse Rate:  [84-97] 95 (01/17  0800) Resp:  [20-32] 20 (01/17 0800) BP: (94-160)/(73-111) 155/95 (01/17 0800) SpO2:  [92 %-100 %] 100 % (01/17 0800) Weight:  [178 lb 9.2 oz (81 kg)] 178 lb 9.2 oz (81 kg) (01/17 0500)  General - Well nourished, well developed, in no apparent distress, mild lethargy.  Ophthalmologic - ShFundi not visualized due to noncooperation.  Cardiovascular - Regular rate and rhythm.  Mental Status -  Level of arousal and orientation to time, place, and person were intact. Language including expression, naming, repetition, comprehension was assessed and found intact. Fund of Knowledge was assessed and was intact.  Cranial Nerves II - XII - II - Visual field intact OU. III, IV, VI - Extraocular movements intact. V - Facial sensation decreased on the right , about 50% of the left. VII - Facial movement intact bilaterally. VIII - Hearing & vestibular intact bilaterally. X - Palate elevates symmetrically. XI - Chin turning & shoulder shrug intact bilaterally. XII - Tongue protrusion intact.  Motor Strength - The patient's strength was 5/5 LUE and LLE, 3+/5 RUE and RLE.  Bulk was normal and fasciculations were absent.   Motor Tone - Muscle tone was assessed at the neck and appendages and was normal.  Reflexes - The patient's reflexes were 1+ in all extremities and she had no pathological reflexes.  Sensory - Light touch, temperature/pinprick were assessed and were symmetrical.    Coordination - The patient had normal movements in the left hand with no ataxia or dysmetria.  Tremor was absent.  Gait and Station - not tested today.    ASSESSMENT/PLAN Ms. [name] is a 46 y.o. female with history of HTN and DB who was admitted for CP s/p CABG this admission who developed R side weakness and neglect s/p CABG. She did not receive IV t-PA due to unclear LKW, post op status.   Stroke:  Likely L brain as well as posterior circulation infarcts, embolic pattern. CTA with likely proximal  BA thrombus, could be related to s/p CABG.  Resultant  R side hemiparesis and hemiparesthesia  CT head no acute abnormality  CTA head and neck  Proximal BA thrombus, BA, left VA stenosis. Remote pontine and cerebellar infarcts.  Consider repeat CTA neck in 2-3 months as outpt follow up the BA thrombus.  MRI  Not done due to recent CABG and sternum wire  TEE done intraoperatively, no PFO, obvious SOE at that time    LE doppler negative for DVT  LDL 67  HgbA1c 13.1  Lovenox 40 mg sq daily for VTE prophylaxis Diet Carb Modified Fluid consistency: Thin; Room service appropriate? Yes  No antithrombotic prior to admission, now on aspirin 325 mg daily. CTA concerning for BA thrombus, however, pt clinically doing very well. Continue on DAPT with  plavix load, and close neuro check.   Patient counseled to be compliant with her antithrombotic medications  Ongoing aggressive stroke risk factor management  Therapy recommendations:  CIR  Disposition:  pending   CAD s/p CABG  Admitted for unstable angina, SOB  Cardiac cath found severe CAD  S/p CABG  On DAPT now  Hypertension  Stable Permissive hypertension (OK if < 180/105) but gradually normalize in 5-7 days Long-term BP goal normotensive  Hyperlipidemia  Home meds:  No statin  LDL 67, goal < 70  Now on lipitor 80 mg daily due to BA thrombus  Continue statin at discharge  Diabetes type II  HgbA1c 13.1, goal < 7.0  Poorly controlled  SSI  On levemir  DM nurse following  Need close follow up with PCP and check glucose at home, compliance with medication  Tobacco abuse  Current smoker  Smoking cessation counseling provided  Pt is willing to quit  Other Stroke Risk Factors  Obesity, Body mass index is 36.07 kg/m., recommend weight loss, diet and exercise as appropriate   [facility] day # 7  Marvel Plan, MD PhD Stroke Neurology 08/14/2016 10:07 AM   To contact Stroke Continuity  provider, please refer to [email or web address]. After hours, contact General Neurology

## 2016-08-14 NOTE — Progress Notes (Signed)
Called by nurse that pt seems has more right facial droop and right LE weakness. Came to see pt and she stated that she was tired. Of note, she sat in chair whole morning until 2pm. Her right facial droop seems slight worse than in am but RLE about the same as in am. Due to concerning of BA thrombus, she has high risk for neuro worsening. Had CTA head and neck again, did not show signficant change from yesterday. Her symptoms may likely but recrudescence of her stroke due to fatigue. Will continue current treatment and close neuro check / observation.   Marvel PlanJindong Alaze Garverick, MD PhD Stroke Neurology 08/14/2016 4:00 PM

## 2016-08-14 NOTE — Progress Notes (Signed)
Patient ID: [name], female   DOB: 02/27/1971, 46 y.o.   MRN: [id number] [facility] DAILY ICU PROGRESS NOTE                   [address]            [place] [id number]          [phone]   5 Days Post-Op Procedure(s) (LRB): CORONARY ARTERY BYPASS GRAFTING (CABG) x three,SVG to distal circ OM3, SVG to Om1, LIMA to LAD-using left internal mammary artery and right leg greater saphenous vein harvested endoscopically, Sternal plating - right side (N/A) TRANSESOPHAGEAL ECHOCARDIOGRAM (TEE) (N/A)  Total Length of Stay:  LOS: 7 days   Subjective: Progressing with PT, more talkative   Objective: Vital signs in last 24 hours: Temp:  [96.4 F (35.8 C)-99.5 F (37.5 C)] 98.5 F (36.9 C) (01/17 0400) Pulse Rate:  [84-100] 95 (01/17 0700) Cardiac Rhythm: Normal sinus rhythm (01/16 2000) Resp:  [19-32] 20 (01/17 0700) BP: (94-160)/(73-123) 140/82 (01/17 0700) SpO2:  [89 %-100 %] 98 % (01/17 0700) Weight:  [178 lb 9.2 oz (81 kg)] 178 lb 9.2 oz (81 kg) (01/17 0500)  Filed Weights   08/12/16 0600 08/13/16 0500 08/14/16 0500  Weight: 186 lb 1.1 oz (84.4 kg) 193 lb 5.5 oz (87.7 kg) 178 lb 9.2 oz (81 kg)    Weight change: -14 lb 12.3 oz (-6.7 kg)   Hemodynamic parameters for last 24 hours:    Intake/Output from previous day: 01/16 0701 - 01/17 0700 In: 1010 [P.O.:840; I.V.:20; IV Piggyback:150] Out: 2851 [Urine:2850; Stool:1]  Intake/Output this shift: No intake/output data recorded.  Current Meds: Scheduled Meds: . aspirin EC  325 mg Oral Daily   Or  . aspirin  324 mg Per Tube Daily  . atorvastatin  80 mg Oral q1800  . bisacodyl  10 mg Oral Daily   Or  . bisacodyl  10 mg Rectal Daily  . ceFEPime (MAXIPIME) IV  2 g Intravenous Q8H  . clopidogrel  75 mg Oral Daily  . docusate sodium  200 mg Oral Daily  . enoxaparin (LOVENOX) injection  40 mg Subcutaneous QHS  . furosemide  80 mg Intravenous BID  . insulin aspart  0-24 Units Subcutaneous Q4H  .  insulin aspart  6 Units Subcutaneous TID WC  . insulin detemir  30 Units Subcutaneous Daily  . metoCLOPramide (REGLAN) injection  10 mg Intravenous Q6H  . metoprolol tartrate  12.5 mg Oral BID   Or  . metoprolol tartrate  12.5 mg Per Tube BID  . pantoprazole  40 mg Oral Daily  . potassium chloride (KCL MULTIRUN) 30 mEq in 265 mL IVPB  30 mEq Intravenous Once  . potassium chloride  20 mEq Oral BID  . sodium chloride flush  3 mL Intravenous Q12H   Continuous Infusions: . sodium chloride Stopped (08/10/16 1501)  . sodium chloride    . sodium chloride    . lactated ringers    . lactated ringers 10 mL/hr at 08/13/16 0700  . nitroGLYCERIN Stopped (08/09/16 2030)   PRN Meds:.sodium chloride, lactated ringers, metoprolol, ondansetron (ZOFRAN) IV, sodium chloride flush, sodium chloride flush, traMADol  General appearance: alert, cooperative, appears older than stated age and no distress Neurologic: increasing strength in right arm, stands with help Heart: regular rate and rhythm, S1, S2 normal, no murmur, click, rub or gallop Lungs: diminished breath sounds bibasilar Abdomen: soft, non-tender; bowel sounds normal; no masses,  no organomegaly Extremities: extremities  normal, atraumatic, no cyanosis or edema and Homans sign is negative, no sign of DVT Wound: sternum intact  Lab Results: CBC: Recent Labs  08/13/16 0440 08/14/16 0500  WBC 14.7* 12.6*  HGB 9.0* 9.5*  HCT 27.6* 29.4*  PLT 318 424*   BMET:  Recent Labs  08/13/16 0440 08/14/16 0500  NA 137 138  K 3.6 3.6  CL 101 99*  CO2 29 31  GLUCOSE 67 89  BUN 17 11  CREATININE 0.57 0.51  CALCIUM 8.0* 8.4*    CMET: Lab Results  Component Value Date   WBC 12.6 (H) 08/14/2016   HGB 9.5 (L) 08/14/2016   HCT 29.4 (L) 08/14/2016   PLT 424 (H) 08/14/2016   GLUCOSE 89 08/14/2016   CHOL 120 08/08/2016   TRIG 89 08/08/2016   HDL 35 (L) 08/08/2016   LDLCALC 67 08/08/2016   ALT 53 08/13/2016   AST 52 (H) 08/13/2016   NA 138  08/14/2016   K 3.6 08/14/2016   CL 99 (L) 08/14/2016   CREATININE 0.51 08/14/2016   BUN 11 08/14/2016   CO2 31 08/14/2016   INR 1.53 08/09/2016   HGBA1C 13.1 (H) 08/08/2016      PT/INR: No results for input(s): LABPROT, INR in the last 72 hours. Radiology: Ct Angio Head W Or Wo Contrast  Result Date: 08/13/2016 CLINICAL DATA:  Intermittent right-sided weakness since Friday. Recent CABG. EXAM: CT ANGIOGRAPHY HEAD AND NECK TECHNIQUE: Multidetector CT imaging of the head and neck was performed using the standard protocol during bolus administration of intravenous contrast. Multiplanar CT image reconstructions and MIPs were obtained to evaluate the vascular anatomy. Carotid stenosis measurements (when applicable) are obtained utilizing NASCET criteria, using the distal internal carotid diameter as the denominator. CONTRAST:  50 cc Isovue 370 intravenous COMPARISON:  Head CT from yesterday. Brain MRI and intracranial MRA 05/28/2014. FINDINGS: CT HEAD FINDINGS Brain: Single small infarcts in the bilateral cerebellum, right posterior centrum semiovale, and right body of the corpus callosum and left pons that are age indeterminate but new from 37. There is moderate for age chronic white matter disease in the bilateral cerebral hemispheres, likely chronic microvascular ischemia although nonspecific. No visible gray matter infarct. No acute hemorrhage, hydrocephalus, or mass. Vascular: Described below Skull: No acute finding Sinuses: Chronic right sphenoid sinusitis, mild Orbits: Negative Review of the MIP images confirms the above findings CTA NECK FINDINGS Aortic arch: Postoperative arch with changes of recent cardiopulmonary bypass. 2 partially visualized saphenous vein grafts and LIMA graft opacified. Two vessel branching arch. No evidence of dissection or stenosis. No embolic source noted. Right carotid system: Predominant noncalcified plaque at the carotid bulb with up to 40% narrowing. No dissection or  ulceration noted. Left carotid system: Mild atherosclerotic plaque at the common carotid bifurcation and ICA bulb without stenosis. No dissection or ulceration. Vertebral arteries: Noncalcified atherosclerotic plaque in the proximal left subclavian without flow limiting stenosis. No right subclavian or brachiocephalic stenosis. Right dominant vertebrobasilar system. The vertebral arteries are smooth and widely patent to the dura. Skeleton: No acute finding Other neck: No acute finding.  Sheath into the right IJV. Upper chest: Layering pleural effusions, likely moderate. Postoperative changes of CABG described above. Review of the MIP images confirms the above findings CTA HEAD FINDINGS Anterior circulation: Carotid siphon atherosclerosis with moderate stenosis at the right posterior genu. Moderate left paraclinoid atheromatous type narrowing. Mild distal left M1 stenosis. No major branch occlusion. No generalized beading or aneurysm. Posterior circulation: Heavily diseased left V4  segment with intermittent nonvisualization beyond PICA which is also poorly enhancing. The left vertebral is patent to the basilar. This narrowing has an atheromatous type appearance as opposed to dissection or embolus. Moderate narrowing of the right vertebral artery before the basilar. At the vertebrobasilar junction there is luminal contrast filling defect suggesting embolus or web. The mid basilar is moderately stenotic. Atherosclerotic irregularity of the bilateral posterior cerebral arteries without high-grade narrowing. Venous sinuses: Patent Anatomic variants: Incomplete circle-of-Willis. Delayed phase: No parenchymal enhancement or mass. These results were called by telephone at the time of interpretation on 08/13/2016 at 10:50 am to Dr. Marvel Plan , who verbally acknowledged these results. Review of the MIP images confirms the above findings IMPRESSION: 1. Small infarcts in the left pons, bilateral cerebellum, and right cerebral  white matter. These are age indeterminate but new from 73 and concerning for recent infarcts given the clinical circumstances. 2. Small filling defect in the proximal basilar concerning for embolus. No related high-grade stenosis. There is a background of advanced posterior circulation atherosclerosis with high-grade tandem left vertebral stenoses and moderate right vertebral and basilar stenoses. 3. Moderate stenosis in the bilateral carotid siphons. 4. 40% right ICA bulb stenosis from atherosclerotic plaque. 5. Recent CABG. No acute finding or embolic source along the arch or proximal great vessels. 6. Layering pleural effusions. Electronically Signed   By: Marnee Spring M.D.   On: 08/13/2016 11:01     Assessment/Plan: S/P Procedure(s) (LRB): CORONARY ARTERY BYPASS GRAFTING (CABG) x three,SVG to distal circ OM3, SVG to Om1, LIMA to LAD-using left internal mammary artery and right leg greater saphenous vein harvested endoscopically, Sternal plating - right side (N/A) TRANSESOPHAGEAL ECHOCARDIOGRAM (TEE) (N/A) Mobilize Diuresis Diabetes control IP patient rehab consult requested  yesterday Wbc improving, has no fevers , do not think she has pneumonia,  Left lower lobe  Better aerated  Continue aggressive PT  Delight Ovens 08/14/2016 7:58 AM

## 2016-08-15 ENCOUNTER — Inpatient Hospital Stay (HOSPITAL_COMMUNITY): Payer: BLUE CROSS/BLUE SHIELD

## 2016-08-15 DIAGNOSIS — I63412 Cerebral infarction due to embolism of left middle cerebral artery: Secondary | ICD-10-CM

## 2016-08-15 LAB — BASIC METABOLIC PANEL
Anion gap: 9 (ref 5–15)
BUN: 15 mg/dL (ref 6–20)
CO2: 31 mmol/L (ref 22–32)
Calcium: 8.7 mg/dL — ABNORMAL LOW (ref 8.9–10.3)
Chloride: 99 mmol/L — ABNORMAL LOW (ref 101–111)
Creatinine, Ser: 0.56 mg/dL (ref 0.44–1.00)
GFR calc Af Amer: >60 mL/min (ref >60)
GFR calc non Af Amer: >60 mL/min (ref >60)
Glucose, Bld: 64 mg/dL — ABNORMAL LOW (ref 65–99)
Potassium: 3.8 mmol/L (ref 3.5–5.1)
Sodium: 139 mmol/L (ref 135–145)

## 2016-08-15 LAB — GLUCOSE, CAPILLARY
GLUCOSE-CAPILLARY: 168 mg/dL — AB (ref 65–99)
GLUCOSE-CAPILLARY: 242 mg/dL — AB (ref 65–99)
Glucose-Capillary: 115 mg/dL — ABNORMAL HIGH (ref 65–99)
Glucose-Capillary: 65 mg/dL (ref 65–99)
Glucose-Capillary: 77 mg/dL (ref 65–99)
Glucose-Capillary: 311 mg/dL — ABNORMAL HIGH (ref 65–99)

## 2016-08-15 LAB — CBC
HCT: 31.1 % — ABNORMAL LOW (ref 36.0–46.0)
Hemoglobin: 10 g/dL — ABNORMAL LOW (ref 12.0–15.0)
MCH: 29.9 pg (ref 26.0–34.0)
MCHC: 32.2 g/dL (ref 30.0–36.0)
MCV: 92.8 fL (ref 78.0–100.0)
Platelets: 529 10*3/uL — ABNORMAL HIGH (ref 150–400)
RBC: 3.35 MIL/uL — ABNORMAL LOW (ref 3.87–5.11)
RDW: 14.4 % (ref 11.5–15.5)
WBC: 14.4 10*3/uL — ABNORMAL HIGH (ref 4.0–10.5)

## 2016-08-15 MED ORDER — FUROSEMIDE 10 MG/ML IJ SOLN
40.0000 mg | Freq: Two times a day (BID) | INTRAMUSCULAR | Status: DC
  Administered 2016-08-15: 40 mg via INTRAVENOUS
  Filled 2016-08-15: qty 4

## 2016-08-15 NOTE — PMR Pre-admission (Signed)
PMR Admission Coordinator Pre-Admission Assessment  Patient: [name] is an 46 y.o., female MRN: [id number] DOB: February 15, 1971 Height: '4\' 11"'  (149.9 cm) Weight: 77.5 kg (170 lb 14.4 oz)              Insurance Information HMO:     PPO:  X     PCP:      IPA:      80/20:      OTHER:  PRIMARY: Physicist, medical Preferred with [employer], [place]      Policy#: [id number]      Subscriber:  self CM Name:  Vickii Chafe.       Phone#:  [phone]     Fax#:  [phone] Pre-Cert#:  Approved for 7 days, 08/19/16- with last covered day 08/25/16; clincial updates due 08/26/16 to Asa Lente, phone [phone]; fax [phone]      Employer: [employer] distribution center Benefits:  Phone #:  [phone]     Name:  Janene Harvey. Date:  02-27-15 with plan date of 10-28-15 to 10-26-16     Deduct:  $2700 (applies to OOP maximum)      Out of Pocket Max:  $2700      Life Max:  n/a CIR:  100% after deductible met      SNF:  100% after deductible met Outpatient:  100% after deductible met      Co-Pay:   Home Health:  100% after deductible met      Co-Pay:  DME:  100% after deductible met     Co-Pay:   Providers:  In network SECONDARY:       Policy#:       Subscriber:  CM Name:       Phone#:      Fax#:  Pre-Cert#:       Employer:  Benefits:  Phone #:      Name:  Eff. Date:      Deduct:       Out of Pocket Max:       Life Max:  CIR:       SNF:  Outpatient:      Co-Pay:  Home Health:       Co-Pay:  DME:      Co-Pay:   Medicaid Application Date:       Case Manager:  Disability Application Date:       Case Worker:   Emergency Contact Information Contact Information    Name Relation Home Work [place]   [phone]   Luvenia Starch Mother   [phone]     Current Medical History  Patient Admitting Diagnosis: Suspected CVA History of Present Illness: Lindsey Cherry-Gaineyis a 46 y.o.femalewith history of HTN, migraines, T2DM--poorly controlled; who was admitted  on 08/07/16 with severe CP and History taken from patient and chart review.She reports family members available for assistance post-discharge. Cardiac cath done revealing severe multivessel disease. She underwent CABG X 3 with sternal plating. Post op with lethargy and weakness. She was found to have right sided weakness with neglect. 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 CTA head/neck done revaling proximal BA thrombus, BS and L-VA stenosis and remote pontine and cerebellar infarcts. Diabetes coordinator following to help manage uncontrolled BS. Intraoperative TEE without PFO or thrombus. BLE dopplers negative for DVT. Dr. Erlinda Hong felt stroke likely embolic due to surgery or multiple stroke risk factors. To continue ASA/Plavix and consider repeat CTA neck in 2-3 months to follow up on  BA thrombus.   Fluid overload treated with IV diuresis with improvement. She did have projectile vomiting on 01/20 with recommendations for dysphagia 1, thin liquids due to concerns of reflux/vomiting.  GI symptoms have improved and diet advanced to regular today. Therapy ongoing and patient limited by right sided weakness, delayed processing and decreased awareness of deficits.   CIR recommended for follow up therapy.      Past Medical History  Past Medical History:  Diagnosis Date  . History of kidney stones    "passed them"  . Hypertension   . Migraine    "daily" (08/07/2016)  . Pancreatic injury    "damage to pancrease related to gallbladder OR"  . Pneumonia X 1  . Type II diabetes mellitus (HCC)     Family History  family history includes Diabetes in her father and mother; Heart attack (age of onset: 67) in her father; Hypertension in her father and mother.  Prior Rehab/Hospitalizations:  Has the patient had major surgery during 100 days prior to admission? No  Current Medications   Current Facility-Administered Medications:  .  0.9 %  sodium chloride infusion, 250 mL,  Intravenous, PRN, Grace Isaac, MD .  aspirin EC tablet 325 mg, 325 mg, Oral, Daily, Grace Isaac, MD, 325 mg at 08/19/16 1123 .  atorvastatin (LIPITOR) tablet 80 mg, 80 mg, Oral, q1800, Rosalin Hawking, MD, 80 mg at 08/18/16 1816 .  bisacodyl (DULCOLAX) EC tablet 10 mg, 10 mg, Oral, Daily PRN **OR** bisacodyl (DULCOLAX) suppository 10 mg, 10 mg, Rectal, Daily PRN, Grace Isaac, MD .  clopidogrel (PLAVIX) tablet 75 mg, 75 mg, Oral, Daily, Rosalin Hawking, MD, 75 mg at 08/19/16 1124 .  docusate sodium (COLACE) capsule 200 mg, 200 mg, Oral, Daily, Grace Isaac, MD, 200 mg at 08/19/16 1123 .  enoxaparin (LOVENOX) injection 40 mg, 40 mg, Subcutaneous, QHS, Gaye Pollack, MD, 40 mg at 08/18/16 2227 .  furosemide (LASIX) tablet 40 mg, 40 mg, Oral, Daily, Grace Isaac, MD, 40 mg at 08/19/16 1123 .  guaiFENesin (MUCINEX) 12 hr tablet 600 mg, 600 mg, Oral, Q12H PRN, Grace Isaac, MD .  CBG monitoring, , , 4x Daily, AC & HS **AND** insulin aspart (novoLOG) injection 0-24 Units, 0-24 Units, Subcutaneous, TID AC & HS, Grace Isaac, MD, 12 Units at 08/19/16 1127 .  insulin aspart (novoLOG) injection 6 Units, 6 Units, Subcutaneous, TID WC, Gaye Pollack, MD, 6 Units at 08/19/16 1128 .  insulin detemir (LEVEMIR) injection 30 Units, 30 Units, Subcutaneous, Daily, Donielle Liston Alba, PA-C, 30 Units at 08/19/16 1124 .  lisinopril (PRINIVIL,ZESTRIL) tablet 5 mg, 5 mg, Oral, Daily, Tessa N Conte, PA-C, 5 mg at 08/19/16 1123 .  metoprolol tartrate (LOPRESSOR) tablet 25 mg, 25 mg, Oral, BID, Donielle M Zimmerman, PA-C, 25 mg at 08/19/16 1123 .  ondansetron (ZOFRAN) tablet 4 mg, 4 mg, Oral, Q6H PRN **OR** ondansetron (ZOFRAN) injection 4 mg, 4 mg, Intravenous, Q6H PRN, Grace Isaac, MD, 4 mg at 08/17/16 1011 .  pantoprazole (PROTONIX) EC tablet 40 mg, 40 mg, Oral, QAC breakfast, Grace Isaac, MD, 40 mg at 08/19/16 1122 .  potassium chloride (K-DUR,KLOR-CON) CR tablet 10 mEq, 10 mEq,  Oral, Daily, Grace Isaac, MD, 10 mEq at 08/19/16 1123 .  sodium chloride flush (NS) 0.9 % injection 10-40 mL, 10-40 mL, Intracatheter, PRN, Grace Isaac, MD, 10 mL at 08/19/16 0447 .  sodium chloride flush (NS) 0.9 % injection 3 mL, 3  mL, Intravenous, Q12H, Grace Isaac, MD .  sodium chloride flush (NS) 0.9 % injection 3 mL, 3 mL, Intravenous, PRN, Grace Isaac, MD .  traMADol Veatrice Bourbon) tablet 50 mg, 50 mg, Oral, Q6H PRN, Grace Isaac, MD, 50 mg at 08/19/16 1456  Patients Current Diet: Diet heart healthy/carb modified Room service appropriate? 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    Home Assistive Devices / Equipment Home Assistive Devices/Equipment: Eyeglasses, Dentures (specify type) Home Equipment: None  Prior Device Use: Indicate devices/aids used by the patient prior to current illness, exacerbation or injury? None   Prior Functional Level Prior Function Level of Independence: Independent  Self Care: Did the patient need help bathing, dressing, using the toilet or eating?  Independent  Indoor Mobility: Did the patient need assistance with walking from room to room (with or without device)? Independent  Stairs: Did the patient need assistance with internal or external stairs (with or without device)? Independent  Functional Cognition: Did the patient need help planning regular tasks such as shopping or remembering to take medications?  Independent  Current Functional Level Cognition  Overall Cognitive Status: Impaired/Different from baseline Current Attention Level: Selective Orientation Level: Oriented X4 Following Commands: Follows one step commands with increased time Safety/Judgement: Decreased awareness of safety, Decreased awareness of deficits General Comments: pt able to recall 2/5 precautions    Extremity Assessment (includes Sensation/Coordination)  Upper Extremity Assessment: RUE deficits/detail RUE Deficits / Details: Pt with isolated RUE movement adn ableto use RUE as gross assist wtih mobiliyt.Ableto actively lift RUE off bed to @ 30 degrees. Poor functional grasp.unable to oppose digits wtih thumb. Unable to feed self with R hand. Wilhelmenia Blase stage V arm and  IV hand RUE Sensation: decreased light touch ("feels numb") LUE Deficits / Details: grossly WFL at least 3/5  Lower Extremity Assessment: RLE deficits/detail, LLE deficits/detail RLE Deficits / Details: 2/5 knee extension, 2/5 hip ABdct/ADD, 1/5 knee flexion LLE Deficits / Details: grossly WFL at least 3/5    ADLs  Overall ADL's : Needs assistance/impaired Eating/Feeding: Minimal assistance Grooming: Moderate assistance Upper Body Bathing: Moderate assistance Lower Body Bathing: Maximal assistance Upper Body Dressing : Maximal assistance Lower Body Dressing: Maximal assistance Toilet Transfer: +2 for physical assistance, Minimal assistance, Stand-pivot, BSC Toileting- Clothing Manipulation and Hygiene: Maximal assistance, Sit to/from stand Toileting - Clothing Manipulation Details (indicate cue type and reason): Pt able to assist with pulling pants over Lt hip  Functional mobility during ADLs: Minimal assistance, +2 for physical assistance General ADL Comments: Pt able to recall 2/5 sternal precautions.       Mobility  Overal bed mobility: Needs Assistance Bed Mobility: Rolling, Sidelying to Sit, Sit to Sidelying Rolling: Modified  independent (Device/Increase time) Sidelying to sit: Min assist Sit to supine: Mod assist Sit to sidelying: Mod assist General bed mobility comments: verbal and tactile cues, assist to elevate trunk and maintain sternal precaution    Transfers  Overall transfer level: Needs assistance Equipment used: Rolling walker (2 wheeled) Transfers: Sit to/from Stand Sit to Stand: Min guard Stand pivot  transfers: Min guard General transfer comment: verbal cues for sequencing and safety. Min guard/tactile cues to control sit without UE assist.    Ambulation / Gait / Stairs / Wheelchair Mobility  Ambulation/Gait Ambulation/Gait assistance: Museum/gallery curator (Feet): 120 Feet Assistive device: Rolling walker (2 wheeled) Gait Pattern/deviations: Drifts right/left, Step-through pattern, Decreased stride length General Gait Details: Assist to manage RW. Verbal cues to attend to R hand grip on RW. Gait velocity: decreased Gait velocity interpretation: Below normal speed for age/gender    Posture / Balance Dynamic Sitting Balance Sitting balance - Comments: Pt sat EOB x 20 mins with min guard assist.  Pt very guarded  Balance Overall balance assessment: Needs assistance Sitting-balance support: Feet supported, No upper extremity supported Sitting balance-Leahy Scale: Good Sitting balance - Comments: Pt sat EOB x 20 mins with min guard assist.  Pt very guarded  Postural control: Left lateral lean Standing balance support: Bilateral upper extremity supported, During functional activity Standing balance-Leahy Scale: Fair Standing balance comment: RW needed for ambulation    Special needs/care consideration BiPAP/CPAP    no CPM   no Continuous Drip IV   no Dialysis  no       Life Vest    no Oxygen     no Special Bed   no Trach Size   n/a Wound Vac (area)   no      Skin   Surgical incision sternum, approximated edges, skin glue                          Bowel mgmt:Last BM 08/18/16,  continent  Bladder mgmt: continent Diabetic mgmt:   poorly controled diabetic in home environment; A1c 13.1     Previous Home Environment Living Arrangements: Spouse/significant other Available Help at Discharge: Family, Available 24 hours/day Type of Home: House Home Layout: One level Home Access: Stairs to enter [employer] of Steps: 2 (at front entrance) Bathroom Shower/Tub: Tub/shower unit, Multimedia programmer: Associate Professor Accessibility: Yes How Accessible: Accessible via walker Home Care Services: No Additional Comments: pt lives with spouse who works but mom can assist at [employer]  Discharge Living Setting Plans for Discharge Living Setting: Patient's home Type of Home at Discharge: House Discharge Home Layout: One level Discharge Home Access: Stairs to enter [place] of Steps: 2 (at front entrance) Discharge Bathroom Shower/Tub: Tub/shower unit, Walk-in shower Discharge Bathroom Toilet: Standard Discharge Bathroom Accessibility: Yes How Accessible: Accessible via walker Does the patient have any problems obtaining your medications?: No  Social/Family/Support Systems Patient Roles: Spouse, Parent (3 children, one adult child still in the home) Anticipated Caregiver: Benjiman Core (spouse), Tacy Learn (8 year old daughter) and Luvenia Starch (pt.s mother) Anticipated Caregiver's Contact Information: Benjiman Core [phone]; Luvenia Starch [phone] Ability/Limitations of Caregiver: Husband and daughter work , however husband states he will take as much time off as needed .  Additionally, pt's mother, Lelan Pons, is retired and is avaialble as much as needed. Lelan Pons lives in [place] and is a retired Forensic scientist of 35 years.  Lelan Pons is present at the bedside and providing appropriate care of her daughter. Caregiver Availability: 24/7 Discharge Plan Discussed with Primary Caregiver: Yes Is Caregiver In Agreement with Plan?:  Yes Does Caregiver/Family have Issues with Lodging/Transportation while Pt is in Rehab?: No   Goals/Additional Needs Patient/Family Goal for Rehab: min assist PT/OT; supervision and min assist SLP Expected length of stay: 20-25 days Cultural Considerations: none Dietary Needs: carb  modified diet with thin liquids Equipment Needs: TBA Pt/Family Agrees to Admission and willing to participate: Yes Program Orientation Provided & Reviewed with Pt/Caregiver Including Roles  & Responsibilities: Yes   Decrease burden of Care through IP rehab admission: n/a   Possible need for SNF placement upon discharge: not anticipated   Patient Condition: This patient's medical and functional status has changed since the consult dated 08/13/16 in which the Rehabilitation Physician determined and documented that the patient was potentially appropriate for intensive rehabilitative care in an inpatient rehabilitation facility. Issues have been addressed and update has been discussed with Dr. Posey Pronto and patient now appropriate for inpatient rehabilitation. Pt. Will have  24 hour care in the home environment and pt. Has demonstrated ability to tolerate IP Rehab level therapies.  Will admit to inpatient rehab today.   Preadmission Screen Completed By:  Gerlean Ren, 08/19/2016 3:41 PM ______________________________________________________________________   Discussed status with Dr. Posey Pronto on 08/19/16 at  1540  and received telephone approval for admission today.  Admission Coordinator:  Gerlean Ren, time 1540 Sudie Grumbling 08/19/16

## 2016-08-15 NOTE — Progress Notes (Signed)
STROKE TEAM PROGRESS NOTE   SUBJECTIVE (INTERVAL HISTORY) NO family at bedside. Pt lying in bed just finished walk with walker with PT, complaining of tiredness. Yesterday afternoon was concerning for increased right facial droop and RLE weakness, had repeat CTA head and neck showed no changes. Her symptoms were subtle and considered tiredness whole day yesterday. Overnight, no complains. And her neuro stable.   OBJECTIVE Temp:  [96 F (35.6 C)-98.9 F (37.2 C)] 98.2 F (36.8 C) (01/18 0751) Pulse Rate:  [86-115] 97 (01/18 1000) Cardiac Rhythm: Normal sinus rhythm (01/18 0800) Resp:  [17-26] 24 (01/18 1000) BP: (127-159)/(69-115) 159/115 (01/18 0940) SpO2:  [95 %-100 %] 95 % (01/18 1000) Weight:  [176 lb 9.4 oz (80.1 kg)] 176 lb 9.4 oz (80.1 kg) (01/18 0600)  CBC:   Recent Labs Lab 08/14/16 0500 08/15/16 0325  WBC 12.6* 14.4*  HGB 9.5* 10.0*  HCT 29.4* 31.1*  MCV 92.7 92.8  PLT 424* 529*    Basic Metabolic Panel:  Recent Labs Lab 08/10/16 0439  08/10/16 1607  08/14/16 0500 08/15/16 0325  NA 139  < >  --   < > 138 139  K 3.8  < >  --   < > 3.6 3.8  CL 111  < >  --   < > 99* 99*  CO2 23  --   --   < > 31 31  GLUCOSE 107*  < >  --   < > 89 64*  BUN 11  < >  --   < > 11 15  CREATININE 0.70  < > 0.77  < > 0.51 0.56  CALCIUM 7.6*  --   --   < > 8.4* 8.7*  MG 2.7*  --  2.3  --   --   --   < > = values in this interval not displayed.  Lipid Panel:     Component Value Date/Time   CHOL 120 08/08/2016 0304   TRIG 89 08/08/2016 0304   HDL 35 (L) 08/08/2016 0304   CHOLHDL 3.4 08/08/2016 0304   VLDL 18 08/08/2016 0304   LDLCALC 67 08/08/2016 0304   HgbA1c:  Lab Results  Component Value Date   HGBA1C 13.1 (H) 08/08/2016   Urine Drug Screen:     Component Value Date/Time   LABOPIA NONE DETECTED 05/28/2014 0951   COCAINSCRNUR NONE DETECTED 05/28/2014 0951   LABBENZ NONE DETECTED 05/28/2014 0951   AMPHETMU NONE DETECTED 05/28/2014 0951   THCU NONE DETECTED  05/28/2014 0951   LABBARB NONE DETECTED 05/28/2014 0951      IMAGING I have personally reviewed the radiological images below and agree with the radiology interpretations.  Ct Head Wo Contrast 08/12/2016 No acute intracranial abnormality. Chronic white matter changes consistent with microvascular ischemia.   TEE 1/12/21083 -Left ventricle: Normal cavity size. Concentric hypertrophy of severe severity. LV systolic function is normal with an EF of 60-65%. There are no obvious wall motion abnormalities. No thrombus present. No mass present. -Mitral valve: No leaflet thickening and calcification present. Mild mitral annular calcification. Trace regurgitation. -Right ventricle: Normal cavity size, wall thickness and ejection fraction. -Tricuspid valve: Trace regurgitation. The tricuspid valve regurgitation jet is central.  Ct Angio head and Neck W Or Wo Contrast 08/13/2016 IMPRESSION: 1. Small infarcts in the left pons, bilateral cerebellum, and right cerebral white matter. These are age indeterminate but new from 38 and concerning for recent infarcts given the clinical circumstances. 2. Small filling defect in the proximal basilar concerning  for embolus. No related high-grade stenosis. There is a background of advanced posterior circulation atherosclerosis with high-grade tandem left vertebral stenoses and moderate right vertebral and basilar stenoses. 3. Moderate stenosis in the bilateral carotid siphons. 4. 40% right ICA bulb stenosis from atherosclerotic plaque. 5. Recent CABG. No acute finding or embolic source along the arch or proximal great vessels. 6. Layering pleural effusions.   Dg Chest Port 1 View 08/13/2016 IMPRESSION: Evidence a degree of congestive heart failure. Consolidation left lower lobe may well represent atelectasis, although pneumonia superimposed in this area must be of concern. There is left midlung atelectasis which is stable. No pneumothorax. Aortic atherosclerosis.    LE venous doppler  Negative for deep and superficial vein thrombosis in both legs.   Ct Angio Head and neck W Or Wo Contrast 08/14/2016 IMPRESSION: CT HEAD: Stable examination from yesterday's CT HEAD: Multiple acute on chronic small-vessel infarcts including LEFT cerebral peduncle/ pons and LEFT cerebellum. Findings are advanced for age and can be seen with severe chronic hypertension, vasculitis, infectious or inflammatory disease. CTA NECK: Stable examination from yesterday's CTA NECK: Atherosclerosis without hemodynamically significant stenosis or acute vascular process. Recent CABG with similar pleural effusions. CTA HEAD: Stable examination from yesterday's CTA HEAD: No emergent large vessel occlusion. Similar tiny thromboembolism proximal basilar artery. Advanced posterior circulation atherosclerosis with multifocal moderate and severe stenoses. Moderate carotid siphon stenoses.   PHYSICAL EXAM  Temp:  [96 F (35.6 C)-98.9 F (37.2 C)] 98.2 F (36.8 C) (01/18 0751) Pulse Rate:  [86-115] 97 (01/18 1000) Resp:  [17-26] 24 (01/18 1000) BP: (127-159)/(69-115) 159/115 (01/18 0940) SpO2:  [95 %-100 %] 95 % (01/18 1000) Weight:  [176 lb 9.4 oz (80.1 kg)] 176 lb 9.4 oz (80.1 kg) (01/18 0600)  General - Well nourished, well developed, in no apparent distress, mild lethargy.  Ophthalmologic - ShFundi not visualized due to noncooperation.  Cardiovascular - Regular rate and rhythm.  Mental Status -  Level of arousal and orientation to time, place, and person were intact. Language including expression, naming, repetition, comprehension was assessed and found intact. Fund of Knowledge was assessed and was intact.  Cranial Nerves II - XII - II - Visual field intact OU. III, IV, VI - Extraocular movements intact. V - Facial sensation decreased on the right , about 50% of the left. VII - Facial movement intact bilaterally. VIII - Hearing & vestibular intact bilaterally. X - Palate  elevates symmetrically. XI - Chin turning & shoulder shrug intact bilaterally. XII - Tongue protrusion intact.  Motor Strength - The patient's strength was 5/5 LUE and LLE, 3+/5 RUE and 3-/5 RLE.  Bulk was normal and fasciculations were absent.   Motor Tone - Muscle tone was assessed at the neck and appendages and was normal.  Reflexes - The patient's reflexes were 1+ in all extremities and she had no pathological reflexes.  Sensory - Light touch, temperature/pinprick were assessed and were symmetrical.    Coordination - The patient had normal movements in the left hand with no ataxia or dysmetria.  Tremor was absent.  Gait and Station - not tested today.    ASSESSMENT/PLAN Ms. [name] is a 46 y.o. female with history of HTN and DB who was admitted for CP s/p CABG this admission who developed R side weakness and neglect s/p CABG. She did not receive IV t-PA due to unclear LKW, post op status.   Stroke:  Likely L brain infarcts, could be related to procedure or due to multiple stroke  risk factors including CAD, uncontrolled DM, smoker, HLD, HTN. Tiny proximal BA thrombus, could be related to s/p CABG. Remote posterior lacunar infarcts likely small vessel disease.  Resultant  R side hemiparesis and hemiparesthesia  CT head no acute abnormality  CTA head and neck  Proximal BA thrombus, BA, left VA stenosis. Remote pontine and cerebellar infarcts.  Consider repeat CTA neck in 2-3 months as outpt follow up the BA thrombus.  MRI  Not done due to recent CABG and sternum wire  TEE done intraoperatively, no PFO, obvious SOE at that time    LE doppler negative for DVT  LDL 67  HgbA1c 13.1  Lovenox 40 mg sq daily for VTE prophylaxis Diet Carb Modified Fluid consistency: Thin; Room service appropriate? Yes  No antithrombotic prior to admission, now on aspirin 325 mg daily. CTA concerning for BA thrombus, however, pt clinically doing very well. Continue on DAPT with plavix  load, and close neuro check.   Patient counseled to be compliant with her antithrombotic medications  Ongoing aggressive stroke risk factor management  Therapy recommendations:  CIR  Disposition:  pending   CAD s/p CABG  Admitted for unstable angina, SOB  Cardiac cath found severe CAD  S/p CABG  On DAPT now  Hypertension  Stable Permissive hypertension (OK if < 180/105) but gradually normalize in 5-7 days Long-term BP goal normotensive  Hyperlipidemia  Home meds:  No statin  LDL 67, goal < 70  Now on lipitor 80 mg daily due to BA thrombus  Continue statin at discharge  Diabetes type II  HgbA1c 13.1, goal < 7.0  Poorly controlled  SSI  On levemir  DM nurse following  Need close follow up with PCP and check glucose at home, compliance with medication  Tobacco abuse  Current smoker  Smoking cessation counseling provided  Pt is willing to quit  Other Stroke Risk Factors  Obesity, Body mass index is 35.67 kg/m., recommend weight loss, diet and exercise as appropriate   [facility] day # 8  Marvel Plan, MD PhD Stroke Neurology 08/15/2016 10:18 AM   To contact Stroke Continuity provider, please refer to [email or web address]. After hours, contact General Neurology

## 2016-08-15 NOTE — Progress Notes (Signed)
Patient ID: [name], female   DOB: May 04, 1971, 46 y.o.   MRN: [id number] TCTS DAILY ICU PROGRESS NOTE                   [address]            [place] [address]          [phone]   6 Days Post-Op Procedure(s) (LRB): CORONARY ARTERY BYPASS GRAFTING (CABG) x three,SVG to distal circ OM3, SVG to Om1, LIMA to LAD-using left internal mammary artery and right leg greater saphenous vein harvested endoscopically, Sternal plating - right side (N/A) TRANSESOPHAGEAL ECHOCARDIOGRAM (TEE) (N/A)  Total Length of Stay:  LOS: 8 days   Subjective: Awake , more alert , ambulated short distance with PT help   Objective: Vital signs in last 24 hours: Temp:  [96 F (35.6 C)-98.9 F (37.2 C)] 98.2 F (36.8 C) (01/18 0751) Pulse Rate:  [86-104] 94 (01/18 0800) Cardiac Rhythm: Normal sinus rhythm (01/18 0800) Resp:  [17-26] 25 (01/18 0800) BP: (127-156)/(69-94) 155/94 (01/18 0800) SpO2:  [96 %-100 %] 100 % (01/18 0800) Weight:  [176 lb 9.4 oz (80.1 kg)] 176 lb 9.4 oz (80.1 kg) (01/18 0600)  Filed Weights   08/13/16 0500 08/14/16 0500 08/15/16 0600  Weight: 193 lb 5.5 oz (87.7 kg) 178 lb 9.2 oz (81 kg) 176 lb 9.4 oz (80.1 kg)    Weight change: -1 lb 15.7 oz (-0.9 kg)   Hemodynamic parameters for last 24 hours:    Intake/Output from previous day: 01/17 0701 - 01/18 0700 In: 630 [P.O.:480; IV Piggyback:150] Out: 4400 [Urine:4400]  Intake/Output this shift: No intake/output data recorded.  Current Meds: Scheduled Meds: . aspirin EC  325 mg Oral Daily   Or  . aspirin  324 mg Per Tube Daily  . atorvastatin  80 mg Oral q1800  . bisacodyl  10 mg Oral Daily   Or  . bisacodyl  10 mg Rectal Daily  . ceFEPime (MAXIPIME) IV  2 g Intravenous Q8H  . clopidogrel  75 mg Oral Daily  . docusate sodium  200 mg Oral Daily  . enoxaparin (LOVENOX) injection  40 mg Subcutaneous QHS  . furosemide  80 mg Intravenous BID  . insulin aspart  0-24 Units Subcutaneous TID AC  & HS  . insulin aspart  6 Units Subcutaneous TID WC  . insulin detemir  30 Units Subcutaneous Daily  . metoprolol tartrate  12.5 mg Oral BID   Or  . metoprolol tartrate  12.5 mg Per Tube BID  . pantoprazole  40 mg Oral Daily  . potassium chloride (KCL MULTIRUN) 30 mEq in 265 mL IVPB  30 mEq Intravenous Once  . potassium chloride  20 mEq Oral BID  . sodium chloride flush  3 mL Intravenous Q12H   Continuous Infusions: . sodium chloride Stopped (08/10/16 1501)  . sodium chloride    . sodium chloride    . lactated ringers    . lactated ringers 10 mL/hr at 08/13/16 0700  . nitroGLYCERIN Stopped (08/09/16 2030)   PRN Meds:.sodium chloride, lactated ringers, metoprolol, ondansetron (ZOFRAN) IV, sodium chloride flush, sodium chloride flush, traMADol  General appearance: cooperative, appears older than stated age and no distress Neurologic: unchanged , right arm weakness improving ,  Heart: regular rate and rhythm, S1, S2 normal, no murmur, click, rub or gallop Lungs: diminished breath sounds bibasilar Abdomen: soft, non-tender; bowel sounds normal; no masses,  no organomegaly Extremities: extremities normal, atraumatic, no cyanosis  or edema and Homans sign is negative, no sign of DVT Wound: sternum stable   Lab Results: CBC: Recent Labs  08/14/16 0500 08/15/16 0325  WBC 12.6* 14.4*  HGB 9.5* 10.0*  HCT 29.4* 31.1*  PLT 424* 529*   BMET:  Recent Labs  08/14/16 0500 08/15/16 0325  NA 138 139  K 3.6 3.8  CL 99* 99*  CO2 31 31  GLUCOSE 89 64*  BUN 11 15  CREATININE 0.51 0.56  CALCIUM 8.4* 8.7*    CMET: Lab Results  Component Value Date   WBC 14.4 (H) 08/15/2016   HGB 10.0 (L) 08/15/2016   HCT 31.1 (L) 08/15/2016   PLT 529 (H) 08/15/2016   GLUCOSE 64 (L) 08/15/2016   CHOL 120 08/08/2016   TRIG 89 08/08/2016   HDL 35 (L) 08/08/2016   LDLCALC 67 08/08/2016   ALT 53 08/13/2016   AST 52 (H) 08/13/2016   NA 139 08/15/2016   K 3.8 08/15/2016   CL 99 (L) 08/15/2016     CREATININE 0.56 08/15/2016   BUN 15 08/15/2016   CO2 31 08/15/2016   INR 1.53 08/09/2016   HGBA1C 13.1 (H) 08/08/2016      PT/INR: No results for input(s): LABPROT, INR in the last 72 hours. Radiology: Ct Angio Head W Or Wo Contrast  Result Date: 08/14/2016 CLINICAL DATA:  RIGHT-sided weakness and neglect today. Status post CABG August 12, 2016. History of stroke, poorly controlled hypertension, diabetes, migraines. EXAM: CT ANGIOGRAPHY HEAD AND NECK TECHNIQUE: Multidetector CT imaging of the head and neck was performed using the standard protocol during bolus administration of intravenous contrast. Multiplanar CT image reconstructions and MIPs were obtained to evaluate the vascular anatomy. Carotid stenosis measurements (when applicable) are obtained utilizing NASCET criteria, using the distal internal carotid diameter as the denominator. CONTRAST:  50 cc Omnipaque 350 COMPARISON:  CT HEAD and CTA head and neck August 13, 2016 and MRI/ MRA head May 28, 2014 FINDINGS: CT HEAD FINDINGS BRAIN: Greater than expected patchy supratentorial, LEFT pons and cerebral peduncle, LEFT cerebellar white matter hypodensities, progressed from prior MRI. No intraparenchymal hemorrhage, mass effect, midline shift or acute large vascular territory infarcts. No abnormal extra-axial fluid collections. Basal cisterns are patent. VASCULAR: Trace calcific atherosclerosis of the carotid siphons. SKULL/SOFT TISSUES: No skull fracture. No significant soft tissue swelling. ORBITS/SINUSES: The included ocular globes and orbital contents are normal.Mild chronic RIGHT sphenoid sinusitis. OTHER: None. CTA NECK AORTIC ARCH: Normal appearance of the thoracic arch, 2 vessel arch is a normal variant. Patent included bypass grafts. The origins of the innominate, left Common carotid artery and subclavian artery are widely patent. RIGHT CAROTID SYSTEM: Common carotid artery is widely patent, coursing in a straight line fashion.  Patent carotid bifurcation without hemodynamically significant stenosis by NASCET criteria. Circumferential intimal thickening calcific atherosclerosis results in less than 50% stenosis RIGHT internal carotid artery origin. Normal appearance of the included internal carotid artery. LEFT CAROTID SYSTEM: Common carotid artery is widely patent, coursing in a straight line fashion. Normal appearance of the carotid bifurcation without hemodynamically significant stenosis by NASCET criteria, trace eccentric calcific atherosclerosis. Normal appearance of the included internal carotid artery. VERTEBRAL ARTERIES:RIGHT vertebral artery is dominant. Normal appearance of the vertebral arteries, which appear widely patent. SKELETON: No acute osseous process though bone windows have not been submitted. Longus coli insertional enthesopathy. OTHER NECK: Soft tissues of the neck are non-acute though, not tailored for evaluation. Included view of the chest demonstrates small pleural effusions. Status post median  sternotomy with small pericardial effusion is suspected. LEFT PICC in place. CTA HEAD ANTERIOR CIRCULATION: Patent cervical internal carotid arteries, petrous, cavernous and supra clinoid internal carotid arteries, moderate luminal regularity. Widely patent anterior communicating artery. Patent anterior and middle cerebral arteries with mild proximal and luminal irregularity compatible with atherosclerosis. No large vessel occlusion, hemodynamically significant stenosis, dissection, luminal irregularity, contrast extravasation or aneurysm. POSTERIOR CIRCULATION: Patent vertebral arteries, vertebrobasilar junction and basilar artery, as well as main branch vessels. Moderate stenosis RIGHT mid V4 segment. Tandem severe stenosis LEFT mid to distal V4 segment. Tiny central filling defect proximal basilar artery, unchanged. Luminal irregularity of the main branch vessels. Moderate stenosis proximal basilar artery, with mild distal  luminal irregularity. Moderate luminal irregularity proximal bilateral posterior cerebral arteries. No large vessel occlusion, hemodynamically significant stenosis, dissection, luminal irregularity, contrast extravasation or aneurysm. VENOUS SINUSES: Major dural venous sinuses are patent though not tailored for evaluation on this angiographic examination. ANATOMIC VARIANTS: None. DELAYED PHASE: No abnormal intracranial enhancement. IMPRESSION: CT HEAD: Stable examination from yesterday's CT HEAD: Multiple acute on chronic small-vessel infarcts including LEFT cerebral peduncle/ pons and LEFT cerebellum. Findings are advanced for age and can be seen with severe chronic hypertension, vasculitis, infectious or inflammatory disease. CTA NECK: Stable examination from yesterday's CTA NECK: Atherosclerosis without hemodynamically significant stenosis or acute vascular process. Recent CABG with similar pleural effusions. CTA HEAD: Stable examination from yesterday's CTA HEAD: No emergent large vessel occlusion. Similar tiny thromboembolism proximal basilar artery. Advanced posterior circulation atherosclerosis with multifocal moderate and severe stenoses. Moderate carotid siphon stenoses. Electronically Signed   By: Awilda Metro M.D.   On: 08/14/2016 16:20   Dg Chest Port 1 View  Result Date: 08/15/2016 CLINICAL DATA:  Cardiac surgery EXAM: PORTABLE CHEST 1 VIEW COMPARISON:  Yesterday FINDINGS: Stable mild cardiac enlargement. Status post CABG. Left upper extremity PICC with tip at the right atrium. Stable low volume chest with dense retrocardiac opacity. Hazy density at the right base. No edema, effusion, or pneumothorax. IMPRESSION: Stable low volume chest with retrocardiac atelectasis. Electronically Signed   By: Marnee Spring M.D.   On: 08/15/2016 07:21     Assessment/Plan: S/P Procedure(s) (LRB): CORONARY ARTERY BYPASS GRAFTING (CABG) x three,SVG to distal circ OM3, SVG to Om1, LIMA to LAD-using left  internal mammary artery and right leg greater saphenous vein harvested endoscopically, Sternal plating - right side (N/A) TRANSESOPHAGEAL ECHOCARDIOGRAM (TEE) (N/A) Mobilize Diuresis Diabetes control Continue plavix asa , existing cerebral vascular disease  Aggressive rehab, including inpatient rehab     Delight Ovens 08/15/2016 9:15 AM

## 2016-08-15 NOTE — Progress Notes (Signed)
Occupational Therapy Treatment Patient Details Name: [name] MRN: [id number] DOB: 01/21/1971 Today's Date: 08/15/2016    History of present illness 46 yo s/p CABGx 3 with PNA, pt with post op right weakness with CT (-), CTA demonstrates Likely Lt brain as well as posterior circulation infarcts, embolic pattern. PMHx: DM, HTN   OT comments  Pt requires min A +2 for toilet transfers.  Worked on facilitation of reach Rt UE.  Lt Lens of glasses occluded with reduction of diplopia and dizziness with mobility.   Pt is very motivated and spouse is very supportive.  Recommend CIR.   Follow Up Recommendations  CIR;Supervision/Assistance - 24 hour    Equipment Recommendations  3 in 1 bedside commode;Tub/shower bench    Recommendations for Other Services Rehab consult    Precautions / Restrictions Precautions Precautions: Fall;Sternal Precaution Comments: right hemiparesis and inattention Restrictions Other Position/Activity Restrictions: sternal precautions       Mobility Bed Mobility Overal bed mobility: Needs Assistance Bed Mobility: Sidelying to Sit;Sit to Sidelying Rolling: Mod assist Sidelying to sit: Mod assist     Sit to sidelying: Mod assist General bed mobility comments: assist for LEs and to lift trunk and cues for technique   Transfers Overall transfer level: Needs assistance Equipment used: 2 person hand held assist Transfers: Sit to/from Stand;Stand Pivot Transfers Sit to Stand: Min assist;+2 safety/equipment Stand pivot transfers: Min assist;+2 safety/equipment       General transfer comment: Pt requires assist for balance and cues for technique     Balance Overall balance assessment: Needs assistance Sitting-balance support: Feet supported Sitting balance-Leahy Scale: Fair Sitting balance - Comments: Pt sat EOB x 20 mins with min guard assist.  Pt very guarded      Standing balance-Leahy Scale: Poor Standing balance comment: requires min A  for balance                    ADL Overall ADL's : Needs assistance/impaired                         Toilet Transfer: +2 for physical assistance;Minimal assistance;Stand-pivot;BSC   Toileting- Clothing Manipulation and Hygiene: Maximal assistance;Sit to/from stand Toileting - Clothing Manipulation Details (indicate cue type and reason): Pt able to assist with pulling pants over Lt hip      Functional mobility during ADLs: Minimal assistance;+2 for physical assistance General ADL Comments: Pt able to recall 2/5 sternal precautions.         Vision Eye Alignment: Impaired (comment)   Ocular Range of Motion: Restricted on the right Tracking/Visual Pursuits: Right eye does not track laterally;Decreased smoothness of horizontal tracking;Decreased smoothness of vertical tracking;Decreased smoothness of eye movement to RIGHT superior field;Decreased smoothness of eye movement to RIGHT inferior field;Requires cues, head turns, or add eye shifts to track     Diplopia Assessment: Disappears with one eye closed   Additional Comments: Pt wears glasses.  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  Horizontal nystagmus bil. eyes all directions noted.  She lacks end range abduction to the Rt and c/o diplopia.  Pt initially with c/o dizziness with all movements.  Lt lens of glasses taped/occluded with transpore (pt with decreased central vision and has progressive lenses so taped whole lens rather than partial).  With lens occluded, she reports reduction in diplopia and dizziness    Perception     Praxis      Cognition   Behavior During  Therapy: WFL for tasks assessed/performed Overall Cognitive Status: Impaired/Different from baseline Area of Impairment: Orientation;Attention;Memory;Following commands;Problem solving;Safety/judgement;Awareness Orientation Level: Disoriented to;Time Current Attention Level: Selective Memory: Decreased recall of precautions  Following  Commands: Follows one step commands with increased time Safety/Judgement: Decreased awareness of safety;Decreased awareness of deficits Awareness: Intellectual Problem Solving: Slow processing;Decreased initiation;Difficulty sequencing General Comments: pt able to recall 2/5 precautions    Extremity/Trunk Assessment               Exercises General Exercises - Lower Extremity Long Arc Quad: AAROM;Right;10 reps;Seated Heel Slides: AROM;Right;10 reps;Supine Hip Flexion/Marching: AAROM;Right;10 reps;Seated Other Exercises Other Exercises: Worked on functional reach with Rt UE - able to reach for and retrieve cup at ~60* flexion and scaption with min facilitation at elbow for elbow extension and alignment of shoulder   Shoulder Instructions       General Comments      Pertinent Vitals/ Pain       Pain Assessment: Faces Pain Score: 3  Faces Pain Scale: Hurts a little bit Pain Location: chest Pain Descriptors / Indicators: Grimacing Pain Intervention(s): Monitored during session;Repositioned  Home Living     Available Help at Discharge: Family;Available 24 hours/day   Home Access: Stairs to enter Entrance Stairs-Number of Steps: 2 (at front entrance)         Bathroom Shower/Tub: Tub/shower unit;Walk-in shower   Bathroom Toilet: Standard Bathroom Accessibility: Yes How Accessible: Accessible via walker            Prior Functioning/Environment              Frequency  Min 3X/week        Progress Toward Goals  OT Goals(current goals can now be found in the care plan section)  Progress towards OT goals: Progressing toward goals     Plan Discharge plan remains appropriate    Co-evaluation                 End of Session     Activity Tolerance Patient tolerated treatment well   Patient Left in bed;with call bell/phone within reach;with family/visitor present   Nurse Communication Mobility status        Time: 1610-9604 OT Time  Calculation (min): 43 min  Charges: OT General Charges $OT Visit: 1 Procedure OT Treatments $Self Care/Home Management : 8-22 mins $Therapeutic Activity: 23-37 mins  Idalis Hoelting M 08/15/2016, 1:23 PM

## 2016-08-15 NOTE — Progress Notes (Signed)
Inpatient Rehabilitation  Note good progress with OT today.  Plan to initiate insurance authorization.  Will follow for timing of medical readiness and insurance approval.  Please call with questions.   Charlane FerrettiMelissa Loris Winrow, M.A., CCC/SLP Admission Coordinator  [facility]  Cell [phone]

## 2016-08-15 NOTE — Progress Notes (Signed)
Inpatient Diabetes Program Recommendations  AACE/ADA: New Consensus Statement on Inpatient Glycemic Control (2015)  Target Ranges:  Prepandial:   less than 140 mg/dL      Peak postprandial:   less than 180 mg/dL (1-2 hours)      Critically ill patients:  140 - 180 mg/dL   Lab Results  Component Value Date   GLUCAP 115 (H) 08/15/2016   HGBA1C 13.1 (H) 08/08/2016    Review of Glycemic Control:  Results for [name], [name] (MRN [id number]) as of 08/15/2016 09:26  Ref. Range 08/14/2016 09:00 08/14/2016 12:27 08/14/2016 16:58 08/14/2016 21:50 08/15/2016 07:50  Glucose-Capillary Latest Ref Range: 65 - 99 mg/dL 409143 (H) 811184 (H) 914121 (H) 75 115 (H)   Inpatient Diabetes Program Recommendations:    Consider slight reduction in Levemir to 26 units q HS and reduce Novolog meal coverage to 4 units tid with meals.   Thanks, Beryl MeagerJenny Nimisha Rathel, RN, BC-ADM Inpatient Diabetes Coordinator Pager [phone] (8a-5p)

## 2016-08-15 NOTE — Progress Notes (Addendum)
Physical Therapy Treatment Patient Details Name: [name] MRN: [id number] DOB: 1970-11-28 Today's Date: 08/15/2016    History of Present Illness 46 yo s/p CABGx 3 with PNA, pt with post op right weakness with CT (-), CTA demonstrates Likely Lt brain as well as posterior circulation infarcts, embolic pattern. PMHx: DM, HTN    PT Comments    Pt pleasant but tearful at times due to stating "I don't want to be a nuisance". Pt educated for progression, provided reassurance and encouragement. Pt able to state 2/5 precautions today with education for all. Pt with difficulty with visual tracking due to loss of target and nystagmus particularly with looking left. Pt educated for gaze stabilization but stated it didn't make a large difference. Pt educated for RLE HEP and encouraged to perform pt remains with hip flexion 2-/5, knee extension 2-/5, knee flexion 3/5. Will continue to follow.   HR 103-115 with gait, 89 at rest supine end of session BP 159/115 end of session sats 97% on RA   Follow Up Recommendations  CIR;Supervision/Assistance - 24 hour     Equipment Recommendations       Recommendations for Other Services       Precautions / Restrictions Precautions Precautions: Fall;Sternal Precaution Comments: right hemiparesis and inattention    Mobility  Bed Mobility   Bed Mobility: Sit to Sidelying Rolling: Mod assist       Sit to sidelying: Min assist General bed mobility comments: increased time with cues for sequence, assist to control trunk but pt able to bring both legs onto surface today. mod assist for rolling once in bed  Transfers Overall transfer level: Needs assistance   Transfers: Sit to/from Stand Sit to Stand: Min assist;+2 safety/equipment         General transfer comment: assist with cues for hand placement, sequence, posture and safety x 2 trials from recliner  Ambulation/Gait Ambulation/Gait assistance: Mod assist;+2  safety/equipment Ambulation Distance (Feet): 45 Feet Assistive device: Rolling walker (2 wheeled) Gait Pattern/deviations: Step-to pattern;Decreased stance time - right;Decreased weight shift to right   Gait velocity interpretation: Below normal speed for age/gender General Gait Details: pt with cues for step sequence, RUE placement with assist to position RUE on RW, cues for posture and safety, increased lean and right hip flexion with fatigue. Pt walked 8' then 45' after seated rest. cues for visual target for gaze stabilization due to dizziness and nystagmus with tracking. Chair to follow.    Stairs            Wheelchair Mobility    Modified Rankin (Stroke Patients Only) Modified Rankin (Stroke Patients Only) Pre-Morbid Rankin Score: No symptoms Modified Rankin: Moderately severe disability     Balance Overall balance assessment: Needs assistance   Sitting balance-Leahy Scale: Fair       Standing balance-Leahy Scale: Poor                      Cognition Arousal/Alertness: Awake/alert Behavior During Therapy: Flat affect Overall Cognitive Status: Impaired/Different from baseline Area of Impairment: Orientation;Attention;Memory;Following commands;Problem solving;Safety/judgement;Awareness Orientation Level: Disoriented to;Time Current Attention Level: Sustained Memory: Decreased short-term memory;Decreased recall of precautions Following Commands: Follows one step commands with increased time Safety/Judgement: Decreased awareness of safety;Decreased awareness of deficits   Problem Solving: Slow processing;Decreased initiation;Difficulty sequencing;Requires verbal cues;Requires tactile cues General Comments: pt able to recall 2/5 precautions    Exercises General Exercises - Lower Extremity Long Arc Quad: AAROM;Right;10 reps;Seated Heel Slides: AROM;Right;10 reps;Supine Hip Flexion/Marching: AAROM;Right;10 reps;Seated  General Comments         Pertinent Vitals/Pain Pain Score: 3  Pain Location: chest Pain Descriptors / Indicators: Pressure Pain Intervention(s): Limited activity within patient's tolerance;Repositioned    Home Living                      Prior Function            PT Goals (current goals can now be found in the care plan section) Progress towards PT goals: Progressing toward goals    Frequency           PT Plan Current plan remains appropriate    Co-evaluation             End of Session Equipment Utilized During Treatment: Gait belt Activity Tolerance: Patient tolerated treatment well Patient left: in bed;with call bell/phone within reach;with bed alarm set     Time: 0812-0843 PT Time Calculation (min) (ACUTE ONLY): 31 min  Charges:  $Gait Training: 8-22 mins $Therapeutic Exercise: 8-22 mins                    G Codes:      Gayl Ivanoff B Tahari Clabaugh 08/15/2016, 9:49 AM Delaney MeigsMaija Tabor Wylodean Shimmel, PT [phone]

## 2016-08-15 NOTE — Progress Notes (Signed)
Hypoglycemic Event  CBG: 65  Treatment: 4 oz juice   Symptoms: none   Follow-up CBG: Time:1715 CBG Result: 77 .  Possible Reasons for Event: poor po intake   Comments/MD notified:    Belita Warsame, Blanchard KelchStephanie Ingold

## 2016-08-15 NOTE — Progress Notes (Signed)
      [address]       [place] [id number]             [phone]      POD # 6  Sleeping currently  BP (!) 154/89   Pulse (!) 102   Temp 98.2 F (36.8 C) (Oral)   Resp 19   Ht 4\' 11"  (1.499 m)   Wt 176 lb 9.4 oz (80.1 kg)   LMP 07/19/2016   SpO2 99%   BMI 35.67 kg/m    Intake/Output Summary (Last 24 hours) at 08/15/16 1936 Last data filed at 08/15/16 1700  Gross per 24 hour  Intake              790 ml  Output             2800 ml  Net            -2010 ml   No new issues  Viviann SpareSteven C. Dorris FetchHendrickson, MD [facility] [phone]

## 2016-08-16 LAB — BASIC METABOLIC PANEL
Anion gap: 10 (ref 5–15)
BUN: 15 mg/dL (ref 6–20)
CO2: 32 mmol/L (ref 22–32)
Calcium: 8.8 mg/dL — ABNORMAL LOW (ref 8.9–10.3)
Chloride: 95 mmol/L — ABNORMAL LOW (ref 101–111)
Creatinine, Ser: 0.54 mg/dL (ref 0.44–1.00)
GFR calc Af Amer: >60 mL/min (ref >60)
GFR calc non Af Amer: >60 mL/min (ref >60)
Glucose, Bld: 84 mg/dL (ref 65–99)
Potassium: 3.7 mmol/L (ref 3.5–5.1)
Sodium: 137 mmol/L (ref 135–145)

## 2016-08-16 LAB — CBC
HCT: 32 % — ABNORMAL LOW (ref 36.0–46.0)
Hemoglobin: 10.5 g/dL — ABNORMAL LOW (ref 12.0–15.0)
MCH: 30.1 pg (ref 26.0–34.0)
MCHC: 32.8 g/dL (ref 30.0–36.0)
MCV: 91.7 fL (ref 78.0–100.0)
Platelets: 585 10*3/uL — ABNORMAL HIGH (ref 150–400)
RBC: 3.49 MIL/uL — ABNORMAL LOW (ref 3.87–5.11)
RDW: 14.2 % (ref 11.5–15.5)
WBC: 15.2 10*3/uL — ABNORMAL HIGH (ref 4.0–10.5)

## 2016-08-16 LAB — GLUCOSE, CAPILLARY
GLUCOSE-CAPILLARY: 207 mg/dL — AB (ref 65–99)
Glucose-Capillary: 150 mg/dL — ABNORMAL HIGH (ref 65–99)
Glucose-Capillary: 118 mg/dL — ABNORMAL HIGH (ref 65–99)
Glucose-Capillary: 99 mg/dL (ref 65–99)

## 2016-08-16 MED ORDER — ONDANSETRON HCL 4 MG/2ML IJ SOLN
4.0000 mg | Freq: Four times a day (QID) | INTRAMUSCULAR | Status: DC | PRN
  Administered 2016-08-16 – 2016-08-17 (×2): 4 mg via INTRAVENOUS
  Filled 2016-08-16 (×2): qty 2

## 2016-08-16 MED ORDER — DOCUSATE SODIUM 100 MG PO CAPS
200.0000 mg | ORAL_CAPSULE | Freq: Every day | ORAL | Status: DC
  Administered 2016-08-16 – 2016-08-19 (×4): 200 mg via ORAL
  Filled 2016-08-16 (×4): qty 2

## 2016-08-16 MED ORDER — SODIUM CHLORIDE 0.9 % IV SOLN: 250.0000 mL | INTRAVENOUS | Status: DC | PRN

## 2016-08-16 MED ORDER — ONDANSETRON HCL 4 MG PO TABS: 4.0000 mg | ORAL_TABLET | Freq: Four times a day (QID) | ORAL | Status: DC | PRN

## 2016-08-16 MED ORDER — GUAIFENESIN ER 600 MG PO TB12: 600.0000 mg | ORAL_TABLET | Freq: Two times a day (BID) | ORAL | Status: DC | PRN

## 2016-08-16 MED ORDER — LISINOPRIL 2.5 MG PO TABS
2.5000 mg | ORAL_TABLET | Freq: Every day | ORAL | Status: DC
  Administered 2016-08-16 – 2016-08-18 (×3): 2.5 mg via ORAL
  Filled 2016-08-16 (×3): qty 1

## 2016-08-16 MED ORDER — SODIUM CHLORIDE 0.9% FLUSH: 3.0000 mL | INTRAVENOUS | Status: DC | PRN

## 2016-08-16 MED ORDER — BISACODYL 10 MG RE SUPP: 10.0000 mg | Freq: Every day | RECTAL | Status: DC | PRN

## 2016-08-16 MED ORDER — INSULIN ASPART 100 UNIT/ML ~~LOC~~ SOLN
0.0000 [IU] | Freq: Three times a day (TID) | SUBCUTANEOUS | Status: DC
  Administered 2016-08-16: 2 [IU] via SUBCUTANEOUS
  Administered 2016-08-16: 8 [IU] via SUBCUTANEOUS
  Administered 2016-08-17: 4 [IU] via SUBCUTANEOUS
  Administered 2016-08-17 – 2016-08-18 (×6): 2 [IU] via SUBCUTANEOUS
  Administered 2016-08-19: 12 [IU] via SUBCUTANEOUS

## 2016-08-16 MED ORDER — MOVING RIGHT ALONG BOOK
Freq: Once | Status: AC
  Administered 2016-08-16: 08:00:00
  Filled 2016-08-16: qty 1

## 2016-08-16 MED ORDER — METOPROLOL TARTRATE 12.5 MG HALF TABLET
12.5000 mg | ORAL_TABLET | Freq: Two times a day (BID) | ORAL | Status: DC
  Administered 2016-08-16 – 2016-08-17 (×3): 12.5 mg via ORAL
  Filled 2016-08-16 (×5): qty 1

## 2016-08-16 MED ORDER — TRAMADOL HCL 50 MG PO TABS
50.0000 mg | ORAL_TABLET | Freq: Four times a day (QID) | ORAL | Status: DC | PRN
  Administered 2016-08-16 – 2016-08-19 (×3): 50 mg via ORAL
  Filled 2016-08-16 (×3): qty 1

## 2016-08-16 MED ORDER — LIVING WELL WITH DIABETES BOOK
Freq: Once | Status: AC
  Administered 2016-08-16: 1
  Filled 2016-08-16: qty 1

## 2016-08-16 MED ORDER — POTASSIUM CHLORIDE CRYS ER 10 MEQ PO TBCR
10.0000 meq | EXTENDED_RELEASE_TABLET | Freq: Every day | ORAL | Status: DC
  Administered 2016-08-16 – 2016-08-19 (×4): 10 meq via ORAL
  Filled 2016-08-16 (×5): qty 1

## 2016-08-16 MED ORDER — SODIUM CHLORIDE 0.9% FLUSH: 3.0000 mL | Freq: Two times a day (BID) | INTRAVENOUS | Status: DC

## 2016-08-16 MED ORDER — PANTOPRAZOLE SODIUM 40 MG PO TBEC
40.0000 mg | DELAYED_RELEASE_TABLET | Freq: Every day | ORAL | Status: DC
  Administered 2016-08-16 – 2016-08-19 (×4): 40 mg via ORAL
  Filled 2016-08-16 (×4): qty 1

## 2016-08-16 MED ORDER — BISACODYL 5 MG PO TBEC: 10.0000 mg | DELAYED_RELEASE_TABLET | Freq: Every day | ORAL | Status: DC | PRN

## 2016-08-16 MED ORDER — FUROSEMIDE 40 MG PO TABS
40.0000 mg | ORAL_TABLET | Freq: Every day | ORAL | Status: DC
  Administered 2016-08-16 – 2016-08-19 (×4): 40 mg via ORAL
  Filled 2016-08-16 (×4): qty 1

## 2016-08-16 MED ORDER — ASPIRIN EC 325 MG PO TBEC
325.0000 mg | DELAYED_RELEASE_TABLET | Freq: Every day | ORAL | Status: DC
  Administered 2016-08-16 – 2016-08-19 (×4): 325 mg via ORAL
  Filled 2016-08-16 (×4): qty 1

## 2016-08-16 NOTE — Progress Notes (Signed)
Inpatient Diabetes Program Recommendations  AACE/ADA: New Consensus Statement on Inpatient Glycemic Control (2015)  Target Ranges:  Prepandial:   less than 140 mg/dL      Peak postprandial:   less than 180 mg/dL (1-2 hours)      Critically ill patients:  140 - 180 mg/dL   Lab Results  Component Value Date   GLUCAP 150 (H) 08/16/2016   HGBA1C 13.1 (H) 08/08/2016    Review of Glycemic Control:  Results for [name], [name] (MRN [id number]) as of 08/16/2016 11:58  Ref. Range 08/15/2016 07:50 08/15/2016 11:47 08/15/2016 16:54 08/15/2016 17:19 08/15/2016 21:51 08/15/2016 22:55 08/16/2016 08:52  Glucose-Capillary Latest Ref Range: 65 - 99 mg/dL 409115 (H) 811168 (H) 65 77 914311 (H) 242 (H) 150 (H)   Diabetes history: Type 2 diabetes Outpatient Diabetes medications: 70/30 75 units in the AM, 25 units in the PM Current orders for Inpatient glycemic control:  Levemir 30 units daily, Novolog 6 units tid with meals, TCTS tid with meals and HS  Inpatient Diabetes Program Recommendations:   Please consider reducing Novolog correction to moderate tid with meals and HS scale.   Thanks, Beryl MeagerJenny Abel Ra, RN, BC-ADM Inpatient Diabetes Coordinator Pager [phone] (8a-5p)

## 2016-08-16 NOTE — Progress Notes (Signed)
Patient arrived from 2S on a wheelchair, assessment completed see flowsheet, placed on tele ccmd notified, patient oriented to room and staff, bed in lowest position, call bell within reach , will continue to monitor.

## 2016-08-16 NOTE — Progress Notes (Signed)
Inpatient Diabetes Program Recommendations  AACE/ADA: New Consensus Statement on Inpatient Glycemic Control (2015)  Target Ranges:  Prepandial:   less than 140 mg/dL      Peak postprandial:   less than 180 mg/dL (1-2 hours)      Critically ill patients:  140 - 180 mg/dL   Lab Results  Component Value Date   GLUCAP 207 (H) 08/16/2016   HGBA1C 13.1 (H) 08/08/2016   Diabetes history: Type 2 diabetes Outpatient Diabetes medications: 70/30 75 units in the AM, 25 units in the PM  Spoke with patient regarding elevated A1C.  She see's Dr. Jeanella Antoneece for her PCP but admits that she was not taking insulin prior to admit due to change in insurance.  She was taking close to 100 units of 70/30 prior to going off insulin.  She states she had not been taking insulin for months. Discussed glycemic control and the difference between Levemir-Novolog regimen versus 70/30.  Explained that her insulin needs are less than what she was requiring on 70/30 and therefore MD may consider Levemir/Novolog at discharge.  Encouraged patient to check blood sugars at least 4 times a day and to record.  Also encouraged follow-up with PCP as soon as possible after discharge.  Patient did not seem engaged however mother and daughters were listening.  Will order Living Well with Diabetes booklet for patient.    Thanks, Beryl MeagerJenny Sarahbeth Cashin, RN, BC-ADM Inpatient Diabetes Coordinator Pager [phone] (8a-5p)

## 2016-08-16 NOTE — Care Management Note (Signed)
Case Management Note  Patient Details  Name: [name] MRN: [id number] Date of Birth: 10/23/1970  Subjective/Objective:   Pt lives with spouse, mother will be available to provide 24/7 assistance when needed.  Plan is transfer to CIR when medically ready and insurance has approved.                         Expected Discharge Plan:  IP Rehab Facility  Discharge planning Services  CM Consult  Status of Service:  In process, will continue to follow  Magdalene RiverMayo, Bernisha Verma T, RN 08/16/2016, 10:52 AM

## 2016-08-16 NOTE — Progress Notes (Signed)
Patient ID: [name], female   DOB: 10/09/1970, 46 y.o.   MRN: [id number] TCTS DAILY ICU PROGRESS NOTE                   [address]            [place] [id number]          [phone]   7 Days Post-Op Procedure(s) (LRB): CORONARY ARTERY BYPASS GRAFTING (CABG) x three,SVG to distal circ OM3, SVG to Om1, LIMA to LAD-using left internal mammary artery and right leg greater saphenous vein harvested endoscopically, Sternal plating - right side (N/A) TRANSESOPHAGEAL ECHOCARDIOGRAM (TEE) (N/A)  Total Length of Stay:  LOS: 9 days   Subjective: Alert gaining strenth  Objective: Vital signs in last 24 hours: Temp:  [97.7 F (36.5 C)-98.6 F (37 C)] 97.7 F (36.5 C) (01/18 1942) Pulse Rate:  [82-115] 96 (01/19 0700) Cardiac Rhythm: Normal sinus rhythm (01/19 0700) Resp:  [15-28] 22 (01/19 0700) BP: (120-162)/(75-115) 129/80 (01/19 0700) SpO2:  [92 %-100 %] 96 % (01/19 0700) Weight:  [174 lb 9.7 oz (79.2 kg)] 174 lb 9.7 oz (79.2 kg) (01/19 0600)  Filed Weights   08/14/16 0500 08/15/16 0600 08/16/16 0600  Weight: 178 lb 9.2 oz (81 kg) 176 lb 9.4 oz (80.1 kg) 174 lb 9.7 oz (79.2 kg)    Weight change: -1 lb 15.8 oz (-0.9 kg)   Hemodynamic parameters for last 24 hours:    Intake/Output from previous day: 01/18 0701 - 01/19 0700 In: 1290 [P.O.:1240; IV Piggyback:50] Out: 1200 [Urine:1200]  Intake/Output this shift: No intake/output data recorded.  Current Meds: Scheduled Meds: . aspirin EC  325 mg Oral Daily   Or  . aspirin  324 mg Per Tube Daily  . atorvastatin  80 mg Oral q1800  . bisacodyl  10 mg Oral Daily   Or  . bisacodyl  10 mg Rectal Daily  . ceFEPime (MAXIPIME) IV  2 g Intravenous Q8H  . clopidogrel  75 mg Oral Daily  . docusate sodium  200 mg Oral Daily  . enoxaparin (LOVENOX) injection  40 mg Subcutaneous QHS  . furosemide  40 mg Intravenous BID  . insulin aspart  0-24 Units Subcutaneous TID AC & HS  . insulin aspart  6 Units  Subcutaneous TID WC  . insulin detemir  30 Units Subcutaneous Daily  . metoprolol tartrate  12.5 mg Oral BID   Or  . metoprolol tartrate  12.5 mg Per Tube BID  . pantoprazole  40 mg Oral Daily  . potassium chloride (KCL MULTIRUN) 30 mEq in 265 mL IVPB  30 mEq Intravenous Once  . potassium chloride  20 mEq Oral BID  . sodium chloride flush  3 mL Intravenous Q12H   Continuous Infusions: . sodium chloride Stopped (08/10/16 1501)  . sodium chloride    . sodium chloride    . lactated ringers    . lactated ringers 10 mL/hr at 08/13/16 0700  . nitroGLYCERIN Stopped (08/09/16 2030)   PRN Meds:.sodium chloride, lactated ringers, metoprolol, ondansetron (ZOFRAN) IV, sodium chloride flush, sodium chloride flush, traMADol  General appearance: alert and cooperative Neurologic: intact Heart: regular rate and rhythm, S1, S2 normal, no murmur, click, rub or gallop Lungs: diminished breath sounds bibasilar Abdomen: soft, non-tender; bowel sounds normal; no masses,  no organomegaly Extremities: extremities normal, atraumatic, no cyanosis or edema and Homans sign is negative, no sign of DVT Wound: sternum ok  Lab Results: CBC: Recent Labs  08/15/16  0325 08/16/16 0535  WBC 14.4* 15.2*  HGB 10.0* 10.5*  HCT 31.1* 32.0*  PLT 529* 585*   BMET:  Recent Labs  08/15/16 0325 08/16/16 0535  NA 139 137  K 3.8 3.7  CL 99* 95*  CO2 31 32  GLUCOSE 64* 84  BUN 15 15  CREATININE 0.56 0.54  CALCIUM 8.7* 8.8*    CMET: Lab Results  Component Value Date   WBC 15.2 (H) 08/16/2016   HGB 10.5 (L) 08/16/2016   HCT 32.0 (L) 08/16/2016   PLT 585 (H) 08/16/2016   GLUCOSE 84 08/16/2016   CHOL 120 08/08/2016   TRIG 89 08/08/2016   HDL 35 (L) 08/08/2016   LDLCALC 67 08/08/2016   ALT 53 08/13/2016   AST 52 (H) 08/13/2016   NA 137 08/16/2016   K 3.7 08/16/2016   CL 95 (L) 08/16/2016   CREATININE 0.54 08/16/2016   BUN 15 08/16/2016   CO2 32 08/16/2016   INR 1.53 08/09/2016   HGBA1C 13.1 (H)  08/08/2016      PT/INR: No results for input(s): LABPROT, INR in the last 72 hours. Radiology: No results found.   Assessment/Plan: S/P Procedure(s) (LRB): CORONARY ARTERY BYPASS GRAFTING (CABG) x three,SVG to distal circ OM3, SVG to Om1, LIMA to LAD-using left internal mammary artery and right leg greater saphenous vein harvested endoscopically, Sternal plating - right side (N/A) TRANSESOPHAGEAL ECHOCARDIOGRAM (TEE) (N/A) Mobilize Diuresis Diabetes control Plan for transfer to step-down: see transfer orders  plan inpatient rehab when stronger       Delight Ovens 08/16/2016 7:43 AM

## 2016-08-16 NOTE — Plan of Care (Signed)
Problem: Coping: Goal: Ability to identify appropriate support needs will improve Outcome: Completed/Met Date Met: 08/16/16 Spouse, grown daughter and mother live with   Problem: Self-Care: Goal: Ability to participate in self-care as condition permits will improve Outcome: Progressing Knows she needs to be independent and tries to do tasks on her own

## 2016-08-16 NOTE — Progress Notes (Signed)
Inpatient Rehabilitation  I continue to await insurance authorization.  Attempted to call [employer] case manager x3 with no answer and was unable to leave a voicemail.  Please plan for my co-worker Weldon PickingSusan Blankenship to follow up Monday 1/22.    Charlane FerrettiMelissa Yunus Stoklosa, M.A., CCC/SLP Admission Coordinator  [facility]  Cell [phone]

## 2016-08-16 NOTE — Progress Notes (Signed)
STROKE TEAM PROGRESS NOTE   SUBJECTIVE (INTERVAL HISTORY) Husband is at bedside. Patient is sitting in chair after PT session. Neurologically stable, no acute event overnight. Still has right hemiparesis, seems improved from yesterday, especially lower extremity.   OBJECTIVE Temp:  [97 F (36.1 C)-98.2 F (36.8 C)] 97 F (36.1 C) (01/19 0900) Pulse Rate:  [82-106] 100 (01/19 1000) Cardiac Rhythm: Normal sinus rhythm (01/19 0800) Resp:  [15-28] 23 (01/19 1000) BP: (118-162)/(74-123) 118/74 (01/19 1000) SpO2:  [92 %-100 %] 97 % (01/19 1000) Weight:  [174 lb 9.7 oz (79.2 kg)] 174 lb 9.7 oz (79.2 kg) (01/19 0600)  CBC:   Recent Labs Lab 08/15/16 0325 08/16/16 0535  WBC 14.4* 15.2*  HGB 10.0* 10.5*  HCT 31.1* 32.0*  MCV 92.8 91.7  PLT 529* 585*    Basic Metabolic Panel:  Recent Labs Lab 08/10/16 0439  08/10/16 1607  08/15/16 0325 08/16/16 0535  NA 139  < >  --   < > 139 137  K 3.8  < >  --   < > 3.8 3.7  CL 111  < >  --   < > 99* 95*  CO2 23  --   --   < > 31 32  GLUCOSE 107*  < >  --   < > 64* 84  BUN 11  < >  --   < > 15 15  CREATININE 0.70  < > 0.77  < > 0.56 0.54  CALCIUM 7.6*  --   --   < > 8.7* 8.8*  MG 2.7*  --  2.3  --   --   --   < > = values in this interval not displayed.  Lipid Panel:     Component Value Date/Time   CHOL 120 08/08/2016 0304   TRIG 89 08/08/2016 0304   HDL 35 (L) 08/08/2016 0304   CHOLHDL 3.4 08/08/2016 0304   VLDL 18 08/08/2016 0304   LDLCALC 67 08/08/2016 0304   HgbA1c:  Lab Results  Component Value Date   HGBA1C 13.1 (H) 08/08/2016   Urine Drug Screen:     Component Value Date/Time   LABOPIA NONE DETECTED 05/28/2014 0951   COCAINSCRNUR NONE DETECTED 05/28/2014 0951   LABBENZ NONE DETECTED 05/28/2014 0951   AMPHETMU NONE DETECTED 05/28/2014 0951   THCU NONE DETECTED 05/28/2014 0951   LABBARB NONE DETECTED 05/28/2014 0951      IMAGING I have personally reviewed the radiological images below and agree with the  radiology interpretations.  Ct Head Wo Contrast 08/12/2016 No acute intracranial abnormality. Chronic white matter changes consistent with microvascular ischemia.   TEE 1/12/21083 -Left ventricle: Normal cavity size. Concentric hypertrophy of severe severity. LV systolic function is normal with an EF of 60-65%. There are no obvious wall motion abnormalities. No thrombus present. No mass present. -Mitral valve: No leaflet thickening and calcification present. Mild mitral annular calcification. Trace regurgitation. -Right ventricle: Normal cavity size, wall thickness and ejection fraction. -Tricuspid valve: Trace regurgitation. The tricuspid valve regurgitation jet is central.  Ct Angio head and Neck W Or Wo Contrast 08/13/2016 IMPRESSION: 1. Small infarcts in the left pons, bilateral cerebellum, and right cerebral white matter. These are age indeterminate but new from 89 and concerning for recent infarcts given the clinical circumstances. 2. Small filling defect in the proximal basilar concerning for embolus. No related high-grade stenosis. There is a background of advanced posterior circulation atherosclerosis with high-grade tandem left vertebral stenoses and moderate right vertebral and basilar  stenoses. 3. Moderate stenosis in the bilateral carotid siphons. 4. 40% right ICA bulb stenosis from atherosclerotic plaque. 5. Recent CABG. No acute finding or embolic source along the arch or proximal great vessels. 6. Layering pleural effusions.   Dg Chest Port 1 View 08/13/2016 IMPRESSION: Evidence a degree of congestive heart failure. Consolidation left lower lobe may well represent atelectasis, although pneumonia superimposed in this area must be of concern. There is left midlung atelectasis which is stable. No pneumothorax. Aortic atherosclerosis.   LE venous doppler  Negative for deep and superficial vein thrombosis in both legs.   Ct Angio Head and neck W Or Wo  Contrast 08/14/2016 IMPRESSION: CT HEAD: Stable examination from yesterday's CT HEAD: Multiple acute on chronic small-vessel infarcts including LEFT cerebral peduncle/ pons and LEFT cerebellum. Findings are advanced for age and can be seen with severe chronic hypertension, vasculitis, infectious or inflammatory disease. CTA NECK: Stable examination from yesterday's CTA NECK: Atherosclerosis without hemodynamically significant stenosis or acute vascular process. Recent CABG with similar pleural effusions. CTA HEAD: Stable examination from yesterday's CTA HEAD: No emergent large vessel occlusion. Similar tiny thromboembolism proximal basilar artery. Advanced posterior circulation atherosclerosis with multifocal moderate and severe stenoses. Moderate carotid siphon stenoses.   PHYSICAL EXAM  Temp:  [97 F (36.1 C)-98.2 F (36.8 C)] 97 F (36.1 C) (01/19 0900) Pulse Rate:  [82-106] 100 (01/19 1000) Resp:  [15-28] 23 (01/19 1000) BP: (118-162)/(74-123) 118/74 (01/19 1000) SpO2:  [92 %-100 %] 97 % (01/19 1000) Weight:  [174 lb 9.7 oz (79.2 kg)] 174 lb 9.7 oz (79.2 kg) (01/19 0600)  General - Well nourished, well developed, in no apparent distress, mild lethargy.  Ophthalmologic - ShFundi not visualized due to noncooperation.  Cardiovascular - Regular rate and rhythm.  Mental Status -  Level of arousal and orientation to time, place, and person were intact. Language including expression, naming, repetition, comprehension was assessed and found intact. Fund of Knowledge was assessed and was intact.  Cranial Nerves II - XII - II - Visual field intact OU. III, IV, VI - Extraocular movements intact. V - Facial sensation decreased on the right , about 50% of the left. VII - Facial movement intact bilaterally. VIII - Hearing & vestibular intact bilaterally. X - Palate elevates symmetrically. XI - Chin turning & shoulder shrug intact bilaterally. XII - Tongue protrusion intact.  Motor Strength  - The patient's strength was 5/5 LUE and LLE, 3+/5 RUE and 3-/5 RLE proximal and 3/5 knee extension.  Bulk was normal and fasciculations were absent.   Motor Tone - Muscle tone was assessed at the neck and appendages and was normal.  Reflexes - The patient's reflexes were 1+ in all extremities and she had no pathological reflexes.  Sensory - Light touch, temperature/pinprick were assessed and were symmetrical.    Coordination - The patient had normal movements in the left hand with no ataxia or dysmetria.  Tremor was absent.  Gait and Station - not tested today.    ASSESSMENT/PLAN Ms. Umeka Wrench is a 46 y.o. female with history of HTN and DB who was admitted for CP s/p CABG this admission who developed R side weakness and neglect s/p CABG. She did not receive IV t-PA due to unclear LKW, post op status.   Stroke:  Likely L brain infarcts, could be related to procedure or due to multiple stroke risk factors including CAD, uncontrolled DM, smoker, HLD, HTN. Tiny proximal BA thrombus, could be related to s/p CABG. Remote posterior lacunar  infarcts likely small vessel disease.  Resultant  R side hemiparesis and hemiparesthesia  CT head no acute abnormality  CTA head and neck  Proximal BA thrombus, BA, left VA stenosis. Remote pontine and cerebellar infarcts.  Consider repeat CTA neck in 2-3 months as outpt follow up the BA thrombus.  MRI  Not done due to recent CABG and sternum wire  TEE done intraoperatively, no PFO, obvious SOE at that time    LE doppler negative for DVT  LDL 67  HgbA1c 13.1  Lovenox 40 mg sq daily for VTE prophylaxis Diet heart healthy/carb modified Room service appropriate? Yes; Fluid consistency: Thin  No antithrombotic prior to admission, now on aspirin 325 mg daily. CTA concerning for BA thrombus, however, pt clinically doing very well. Continue on DAPT with plavix load, and close neuro check.   Patient counseled to be compliant with her  antithrombotic medications  Ongoing aggressive stroke risk factor management  Therapy recommendations:  CIR  Disposition:  pending   CAD s/p CABG  Admitted for unstable angina, SOB  Cardiac cath found severe CAD  S/p CABG  On DAPT now  Hypertension  Stable Permissive hypertension (OK if < 180/105) but gradually normalize in 5-7 days Long-term BP goal normotensive  Hyperlipidemia  Home meds:  No statin  LDL 67, goal < 70  Now on lipitor 80 mg daily due to BA thrombus  Continue statin at discharge  Diabetes type II  HgbA1c 13.1, goal < 7.0  Poorly controlled  SSI  On levemir  DM nurse following  Need close follow up with PCP and check glucose at home, compliance with medication  Tobacco abuse  Current smoker  Smoking cessation counseling provided  Pt is willing to quit  Other Stroke Risk Factors  Obesity, Body mass index is 35.27 kg/m., recommend weight loss, diet and exercise as appropriate   Migraines  Hospital day # 9  Neurology will sign off. Please call with questions. [facility] about 6 weeks. Thanks for the consult.   Marvel PlanJindong Damyah Gugel, MD PhD Stroke Neurology 08/16/2016 12:03 PM   To contact Stroke Continuity provider, please refer to [email or web address]. After hours, contact General Neurology

## 2016-08-16 NOTE — Discharge Summary (Signed)
Physician Discharge Summary  Patient ID: [name] MRN: [id number] DOB/AGE: 45/11/1970 45 y.o.  Admit date: 08/07/2016 Discharge date: 08/19/2016  Admission Diagnoses: NSTEMI  Discharge Diagnoses:  Principal Problem:   Hypertension Active Problems:   ACS (acute coronary syndrome) (HCC)   Diabetes mellitus with cardiac complication (HCC)   Non-STEMI (non-ST elevated myocardial infarction) (HCC)   NSTEMI (non-ST elevated myocardial infarction) (HCC)   S/P CABG x 3   Coronary artery disease involving native heart without angina pectoris   Pressure injury of skin   Coronary artery disease involving coronary bypass graft of native heart without angina pectoris   Hx of CABG   Benign essential HTN   Migraine without status migrainosus, not intractable   Labile blood glucose   Diabetes mellitus type 2 in obese (HCC)   Morbid obesity (HCC)   Tachypnea   Hypoalbuminemia due to protein-calorie malnutrition (HCC)   Leukocytosis   Acute blood loss anemia   [facility]  Patient Active Problem List   Diagnosis Date Noted  . Stroke (HCC)   . Coronary artery disease involving coronary bypass graft of native heart without angina pectoris   . Hx of CABG   . Benign essential HTN   . Migraine without status migrainosus, not intractable   . Labile blood glucose   . Diabetes mellitus type 2 in obese (HCC)   . Morbid obesity (HCC)   . Tachypnea   . Hypoalbuminemia due to protein-calorie malnutrition (HCC)   . Leukocytosis   . Acute blood loss anemia   . Pressure injury of skin 08/12/2016  . Coronary artery disease involving native heart without angina pectoris   . 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 ACS (acute coronary syndrome) (HCC) 08/07/2016  . Hypertension 08/07/2016  . Diabetes mellitus with cardiac complication (HCC) 08/07/2016  . Non-STEMI (non-ST elevated myocardial infarction) (HCC) 08/07/2016  . NSTEMI (non-ST elevated myocardial infarction) (HCC) 08/07/2016    History  of Present Illness: at time of consult   The patient is a 46 year old female who presented to ER On today's date for he presented after an episode of substernal chest pain at approximately 1 AM. The pain was severe radiated 10 over 10. Associated with diaphoresis as well as significant shortness of breath. She also felt quite weak. EMS was called and she was given aspirin and nitroglycerin and the pain continued. She has significant cardiac risk factors including poorly controlled diabetes. Her initial EKG showed no significant acute EKG changes. She was seen by cardiology in consultation. It was felt that she should proceed promptly to the cardiac catheterization lab. She was found to have severe multivessel coronary artery disease and we are consult and for consideration of CABG. Peak troponin I so far 0.10. She has not had any previous similar episodes in the past.   Discharged Condition: good  Hospital Course: The patient was admitted to the emergency department where she ruled in for non-STEMI. Cardiology consultation was obtained and she underwent cardiac catheterization where she was found to have severe multivessel coronary artery disease. Cardiothoracic surgical consultation was obtained for consideration of CABG. She was medically stabilized prior to proceeding. On 08/09/2016 she was taken the operating room where she underwent the below described procedure. She tolerated well without obvious complications and was transferred to the surgical intensive care unit for further postoperative care.  Postoperative hospital course: She initially required some inotropic support. This was weaned without significant difficulty. Additionally, she was weaned from the ventilator using  standard protocols without difficulty. She does have a postoperative acute blood loss anemia which has stabilized. She has some postoperative volume overload but renal function is remaining within normal limits and she is  responding to diuretics. She is a poorly controlled diabetic and diabetic control postoperatively has been somewhat challenging. Adjustments in diabetic regimen are being made over time. She did have a postoperative event of neurologic origin with symptoms of right-sided weakness and neglect. Neurology consultation was obtained and assisted with management. Initial CT scan of the head without contrast was negative. Subsequent studies including CTA of head and neck showed proximal BA thrombus, left VA stenosis. Remote pontine and cerebellar infarcts. An MRI has not yet been done. She is poorly motivated in her activity levels but is showing some steady overall progress in this regard. She will have post hospitalization inpatient rehabilitation to assist with this. She has since had some postoperative leukocytosis which is improving and she is not felt currently to have pneumonia. She has not had any significant postoperative dysrhythmias. Incisions are noted be healing well without evidence of infection. At time of discharge she is felt to be medically stable with requirements for ongoing PT/OT. She will require inpatient rehab.    Consults: cardiology and neurology  Significant Diagnostic Studies: angiography: cardiac cath/CT angio head and neck/ CT head without contrast  Treatments: surgery:  DATE OF PROCEDURE:  08/09/2016 DATE OF DISCHARGE:                              OPERATIVE REPORT   POSTOPERATIVE DIAGNOSIS:  Coronary occlusive disease with unstable angina.  POSTOPERATIVE DIAGNOSIS:  Coronary occlusive disease with unstable angina.  SURGICAL PROCEDURE:  Coronary artery bypass grafting x3 with left internal mammary to the left anterior descending coronary artery, reverse saphenous vein graft to the first obtuse marginal, reverse saphenous vein graft to the third obtuse marginal with right leg, thigh, and calf, greater saphenous vein harvesting endoscopically.  Sternal plating,  right side of sternum.  SURGEON:  Sheliah Plane, M.D.  FIRST ASSISTANT:  Jari Favre, PA.  Meds at discharge:  Allergies as of 08/19/2016      Reactions   Eugenol Hives, Other (See Comments)   Burns mouth   Hydrochlorothiazide Anaphylaxis   Morphine And Related Anaphylaxis   Percocet [oxycodone-acetaminophen] Anaphylaxis, Nausea And Vomiting   Shellfish Allergy Anaphylaxis   Sulfa Antibiotics Anaphylaxis   Tomato Anaphylaxis      Medication List    STOP taking these medications   insulin lispro 100 UNIT/ML injection Commonly known as:  HUMALOG   insulin NPH-regular Human (70-30) 100 UNIT/ML injection Commonly known as:  NOVOLIN 70/30     TAKE these medications   aspirin 325 MG EC tablet Take 1 tablet (325 mg total) by mouth daily. Start taking on:  08/20/2016   atorvastatin 80 MG tablet Commonly known as:  LIPITOR Take 1 tablet (80 mg total) by mouth daily at 6 PM.   clopidogrel 75 MG tablet Commonly known as:  PLAVIX Take 1 tablet (75 mg total) by mouth daily. Start taking on:  08/20/2016   enoxaparin 40 MG/0.4ML injection Commonly known as:  LOVENOX Inject 0.4 mLs (40 mg total) into the skin at bedtime.   furosemide 40 MG tablet Commonly known as:  LASIX Take 1 tablet (40 mg total) by mouth daily. Start taking on:  08/20/2016   insulin aspart 100 UNIT/ML injection Commonly known as:  novoLOG Inject 6  Units into the skin 3 (three) times daily with meals.   insulin detemir 100 UNIT/ML injection Commonly known as:  LEVEMIR Inject 0.3 mLs (30 Units total) into the skin daily. Start taking on:  08/20/2016   lisinopril 5 MG tablet Commonly known as:  PRINIVIL,ZESTRIL Take 1 tablet (5 mg total) by mouth daily. Start taking on:  08/20/2016   metoprolol tartrate 25 MG tablet Commonly known as:  LOPRESSOR Take 1 tablet (25 mg total) by mouth 2 (two) times daily.   NASACORT ALLERGY 24HR 55 MCG/ACT Aero nasal inhaler Generic drug:  triamcinolone Place 1  spray into both nostrils daily.   potassium chloride 10 MEQ tablet Commonly known as:  K-DUR,KLOR-CON Take 1 tablet (10 mEq total) by mouth daily. Start taking on:  08/20/2016   traMADol 50 MG tablet Commonly known as:  ULTRAM Take 1 tablet (50 mg total) by mouth every 6 (six) hours as needed for moderate pain.        Discharge Exam: Blood pressure 135/83, pulse 97, temperature 98.2 F (36.8 C), temperature source Oral, resp. rate 19, height 4\' 11"  (1.499 m), weight 170 lb 14.4 oz (77.5 kg), last menstrual period 07/19/2016, SpO2 98 %.   General appearance: alert, cooperative and no distress Heart: regular rate and rhythm, S1, S2 normal, no murmur, click, rub or gallop Lungs: clear to auscultation bilaterally Abdomen: soft, non-tender; bowel sounds normal; no masses,  no organomegaly Extremities: extremities normal, atraumatic, no cyanosis or edema Wound: clean and dry   Disposition: inpatient rehab  Discharge Instructions    Ambulatory referral to Neurology    Complete by:  As directed    Pt will follow up with Dr. Roda Shutters at [facility] in about 2 months. Thanks.   Ambulatory referral to Nutrition and Diabetic Education    Complete by:  As directed    A1C=13.1%-   Discharge patient    Complete by:  As directed    Inpatient rehab   Discharge disposition:  [facility] Not Defined   Discharge patient date:  08/19/2016       Follow-ups:   Dr. Tyrone Sage: Appointment is on March 1st 2018 at 12:00PM. Please arrive at 11:30am for a chest xray at [facility] located on the first floor of our building.   Dr. Roda Shutters: Please call office at [phone] for a follow-up appointment in 6 weeks after discharge.     1.Beta Blocker:  Yes [ y  ]                              No   [   ]                              If No, reason:  2.Ace Inhibitor/ARB: Yes [ y  ]                                     No  [    ]                                     If No,  reason:  3.Statin:   Yes [ y ]  No  [   ]                  If No, reason:  4.Ecasa:  Yes  [ y  ]                  No   [   ]                  If No, reason:   Signed: Sharlene Dory 08/19/2016, 3:20 PM

## 2016-08-16 NOTE — Progress Notes (Signed)
Physical Therapy Treatment Patient Details Name: [name] MRN: [id number] DOB: 01/27/1971 Today's Date: 08/16/2016    History of Present Illness 46 yo s/p CABGx 3 with PNA, pt with post op right weakness with CT (-), CTA demonstrates Likely Lt brain as well as posterior circulation infarcts, embolic pattern. PMHx: DM, HTN    PT Comments    Patient making improvements with mobility.  Agree with need for CIR stay prior to d/c/ home.  Follow Up Recommendations  CIR;Supervision/Assistance - 24 hour     Equipment Recommendations  Rolling walker with 5" wheels    Recommendations for Other Services OT consult;Speech consult     Precautions / Restrictions Precautions Precautions: Fall;Sternal Precaution Comments: right hemiparesis and inattention Restrictions Weight Bearing Restrictions: Yes RUE Weight Bearing: Non weight bearing Other Position/Activity Restrictions: Sternal precautions    Mobility  Bed Mobility Overal bed mobility: Needs Assistance Bed Mobility: Rolling;Sidelying to Sit;Sit to Sidelying Rolling: Mod assist Sidelying to sit: Mod assist     Sit to sidelying: Mod assist General bed mobility comments: Verbal and tactile cues for rolling with use of rail.  Patient using LUE to assist RUE to reach for rail to roll to Lt.  Assist to manage trunk during transitions, and to bring LE's onto bed.  Transfers Overall transfer level: Needs assistance Equipment used: Rolling walker (2 wheeled);2 person hand held assist Transfers: Sit to/from Stand Sit to Stand: Min assist;+2 safety/equipment         General transfer comment: Repeated cues for hand placement and technique.  Assist to manage RUE on RW.  Worked on moving sit <> stand without use of UE's, and equal weight on BLE.s.   Ambulation/Gait Ambulation/Gait assistance: Mod assist;+2 safety/equipment Ambulation Distance (Feet): 22 Feet Assistive device: Rolling walker (2 wheeled) Gait  Pattern/deviations: Step-to pattern;Decreased stance time - right;Decreased weight shift to right Gait velocity: decreased Gait velocity interpretation: Below normal speed for age/gender General Gait Details: Assist to maneuver RW, and to manage RUE on RW.   Decreased control of RLE in stance and swing phases of gait.  At bedside, worked on stepping forward/backward with LLE and weightbearing on RLE, and on shallow squats to focus on RLE control.   Stairs            Wheelchair Mobility    Modified Rankin (Stroke Patients Only) Modified Rankin (Stroke Patients Only) Pre-Morbid Rankin Score: No symptoms Modified Rankin: Moderately severe disability     Balance Overall balance assessment: Needs assistance         Standing balance support: Single extremity supported Standing balance-Leahy Scale: Poor Standing balance comment: requires min A for balance                     Cognition Arousal/Alertness: Awake/alert Behavior During Therapy: WFL for tasks assessed/performed;Flat affect Overall Cognitive Status: Impaired/Different from baseline Area of Impairment: Orientation;Attention;Memory;Following commands;Problem solving;Safety/judgement;Awareness Orientation Level: Disoriented to;Time Current Attention Level: Selective Memory: Decreased recall of precautions Following Commands: Follows one step commands with increased time Safety/Judgement: Decreased awareness of safety;Decreased awareness of deficits   Problem Solving: Slow processing;Decreased initiation;Difficulty sequencing      Exercises      General Comments        Pertinent Vitals/Pain Pain Assessment: Faces Faces Pain Scale: Hurts a little bit Pain Location: chest Pain Descriptors / Indicators: Grimacing;Sore Pain Intervention(s): Monitored during session    Home Living  Prior Function            PT Goals (current goals can now be found in the care plan  section) Acute Rehab PT Goals Patient Stated Goal: to be able to take care of myself Progress towards PT goals: Progressing toward goals    Frequency    Min 4X/week      PT Plan Current plan remains appropriate    Co-evaluation             End of Session Equipment Utilized During Treatment: Gait belt Activity Tolerance: Patient limited by fatigue Patient left: in bed;with call bell/phone within reach;with bed alarm set;with family/visitor present     Time: 1423-1440 PT Time Calculation (min) (ACUTE ONLY): 17 min  Charges:  $Therapeutic Activity: 8-22 mins                    G Codes:      [name] 09-11-16, 3:26 PM [name], [facility] Acute Rehab Services Pager [phone]

## 2016-08-17 LAB — CBC
HCT: 32.4 % — ABNORMAL LOW (ref 36.0–46.0)
Hemoglobin: 10.5 g/dL — ABNORMAL LOW (ref 12.0–15.0)
MCH: 29.8 pg (ref 26.0–34.0)
MCHC: 32.4 g/dL (ref 30.0–36.0)
MCV: 92 fL (ref 78.0–100.0)
Platelets: 611 10*3/uL — ABNORMAL HIGH (ref 150–400)
RBC: 3.52 MIL/uL — ABNORMAL LOW (ref 3.87–5.11)
RDW: 14.2 % (ref 11.5–15.5)
WBC: 15.2 10*3/uL — ABNORMAL HIGH (ref 4.0–10.5)

## 2016-08-17 LAB — BASIC METABOLIC PANEL
Anion gap: 10 (ref 5–15)
BUN: 18 mg/dL (ref 6–20)
CO2: 28 mmol/L (ref 22–32)
Calcium: 8.8 mg/dL — ABNORMAL LOW (ref 8.9–10.3)
Chloride: 99 mmol/L — ABNORMAL LOW (ref 101–111)
Creatinine, Ser: 0.64 mg/dL (ref 0.44–1.00)
GFR calc Af Amer: >60 mL/min (ref >60)
GFR calc non Af Amer: >60 mL/min (ref >60)
Glucose, Bld: 127 mg/dL — ABNORMAL HIGH (ref 65–99)
Potassium: 3.8 mmol/L (ref 3.5–5.1)
Sodium: 137 mmol/L (ref 135–145)

## 2016-08-17 LAB — GLUCOSE, CAPILLARY
GLUCOSE-CAPILLARY: 143 mg/dL — AB (ref 65–99)
GLUCOSE-CAPILLARY: 184 mg/dL — AB (ref 65–99)
Glucose-Capillary: 173 mg/dL — ABNORMAL HIGH (ref 65–99)
Glucose-Capillary: 126 mg/dL — ABNORMAL HIGH (ref 65–99)

## 2016-08-17 MED ORDER — METOCLOPRAMIDE HCL 5 MG/ML IJ SOLN
10.0000 mg | Freq: Four times a day (QID) | INTRAMUSCULAR | Status: AC
  Administered 2016-08-17 – 2016-08-18 (×4): 10 mg via INTRAVENOUS
  Filled 2016-08-17 (×4): qty 2

## 2016-08-17 MED ORDER — LACTULOSE 10 GM/15ML PO SOLN
20.0000 g | Freq: Once | ORAL | Status: AC
  Administered 2016-08-17: 20 g via ORAL
  Filled 2016-08-17: qty 30

## 2016-08-17 MED ORDER — POTASSIUM CHLORIDE CRYS ER 20 MEQ PO TBCR
20.0000 meq | EXTENDED_RELEASE_TABLET | Freq: Once | ORAL | Status: AC
  Administered 2016-08-17: 20 meq via ORAL

## 2016-08-17 MED ORDER — ALTEPLASE 2 MG IJ SOLR
2.0000 mg | Freq: Once | INTRAMUSCULAR | Status: AC
  Administered 2016-08-17: 2 mg
  Filled 2016-08-17 (×2): qty 2

## 2016-08-17 NOTE — Progress Notes (Addendum)
      [address]       [place] [id number]             [phone]        8 Days Post-Op Procedure(s) (LRB): CORONARY ARTERY BYPASS GRAFTING (CABG) x three,SVG to distal circ OM3, SVG to Om1, LIMA to LAD-using left internal mammary artery and right leg greater saphenous vein harvested endoscopically, Sternal plating - right side (N/A) TRANSESOPHAGEAL ECHOCARDIOGRAM (TEE) (N/A)  Subjective: Patient with bowel movement yesterday. She denies nausea or vomiting this am.  Objective: Vital signs in last 24 hours: Temp:  [97 F (36.1 C)-98.8 F (37.1 C)] 98 F (36.7 C) (01/20 0555) Pulse Rate:  [93-106] 95 (01/20 0555) Cardiac Rhythm: Sinus tachycardia (01/19 1900) Resp:  [18-23] 18 (01/20 0555) BP: (118-143)/(72-87) 134/75 (01/20 0555) SpO2:  [95 %-97 %] 96 % (01/20 0555) Weight:  [172 lb 6.4 oz (78.2 kg)] 172 lb 6.4 oz (78.2 kg) (01/20 0608)  Pre op weight 81 kg Current Weight  08/17/16 172 lb 6.4 oz (78.2 kg)      Intake/Output from previous day: 01/19 0701 - 01/20 0700 In: 250 [P.O.:240; I.V.:10] Out: 400 [Urine:400]   Physical Exam:  Cardiovascular: Slightly tachycardic Pulmonary: Clear to auscultation bilaterally Abdomen: Soft, non tender, bowel sounds present. Extremities: Trace bilateral lower extremity edema. Wounds: Clean and dry.  No erythema or signs of infection. Neurologic: Able to move all extremities. Right hand grip weaker than left  Lab Results: CBC: Recent Labs  08/16/16 0535 08/17/16 0415  WBC 15.2* 15.2*  HGB 10.5* 10.5*  HCT 32.0* 32.4*  PLT 585* 611*   BMET:  Recent Labs  08/16/16 0535 08/17/16 0415  NA 137 137  K 3.7 3.8  CL 95* 99*  CO2 32 28  GLUCOSE 84 127*  BUN 15 18  CREATININE 0.54 0.64  CALCIUM 8.8* 8.8*    PT/INR:  Lab Results  Component Value Date   INR 1.53 08/09/2016   INR 1.15 08/08/2016   INR 1.16 08/07/2016   ABG:  INR: Will add last result for INR, ABG once components are  confirmed Will add last 4 CBG results once components are confirmed  Assessment/Plan:  1. CV - SR in the 90's. On Lopresor 12.5 mg bid, Lisinopril 2.5 mg daily, Plavix 75 mg daily, and ecasa 325 mg daily. Will increase Lopressor to 25 mg bid for better HR control. 2.  Pulmonary - On room air. Encourage incentive spirometer. 3. Volume Overload - On Lasix 40 mg daily. 4.  Acute blood loss anemia - H and stable at 10.2 and 31.3 5. ID- on Cefepime for probable pulmonary etiology 6. DM-CBGs 173/126/131. On Insulin. Pre op HGA1C 13.1. She will need close medical follow up after discharge. 7. Will need inpatient rehab when ready for discharge;continue with PT/OT as likely had post op stroke 8. GI-had vomiting yesterday. No nausea or vomiting recently. Will start clear liquids and slowly advance diet as tolerates. Speech path recommends dysphagia 1 (puree) thin liquid so will order.   ZIMMERMAN,DONIELLE MPA-C 08/17/2016,8:06 AM

## 2016-08-17 NOTE — Evaluation (Signed)
Clinical/Bedside Swallow Evaluation Patient Details  Name: [name] MRN: [id number] Date of Birth: 1970-09-07  Today's Date: 08/17/2016 Time: SLP Start Time (ACUTE ONLY): 1455 SLP Stop Time (ACUTE ONLY): 1512 SLP Time Calculation (min) (ACUTE ONLY): 17 min  Past Medical History:  Past Medical History:  Diagnosis Date  . History of kidney stones    "passed them"  . Hypertension   . Migraine    "daily" (08/07/2016)  . Pancreatic injury    "damage to pancrease related to gallbladder OR"  . Pneumonia X 1  . Type II diabetes mellitus (HCC)    Past Surgical History:  Past Surgical History:  Procedure Laterality Date  . CARDIAC CATHETERIZATION  08/07/2016  . CARDIAC CATHETERIZATION N/A 08/07/2016   Procedure: Left Heart Cath and Coronary Angiography;  Surgeon: Lyn Records, MD;  Location: [facility] INVASIVE CV LAB;  Service: Cardiovascular;  Laterality: N/A;  . CARDIAC CATHETERIZATION N/A 08/07/2016   Procedure: Intravascular Pressure Wire/FFR Study;  Surgeon: Lyn Records, MD;  Location: [facility] INVASIVE CV LAB;  Service: Cardiovascular;  Laterality: N/A;  . CORONARY ARTERY BYPASS GRAFT N/A 08/09/2016   Procedure: CORONARY ARTERY BYPASS GRAFTING (CABG) x three,SVG to distal circ OM3, SVG to Om1, LIMA to LAD-using left internal mammary artery and right leg greater saphenous vein harvested endoscopically, Sternal plating - right side;  Surgeon: Delight Ovens, MD;  Location: [facility] OR;  Service: Open Heart Surgery;  Laterality: N/A;  . FACIAL RECONSTRUCTION SURGERY  2009   "facial fractures"  . FRACTURE SURGERY    . LAPAROSCOPIC CHOLECYSTECTOMY    . TEE WITHOUT CARDIOVERSION N/A 08/09/2016   Procedure: TRANSESOPHAGEAL ECHOCARDIOGRAM (TEE);  Surgeon: Delight Ovens, MD;  Location: [facility] OR;  Service: Open Heart Surgery;  Laterality: N/A;  . TUBAL LIGATION     HPI:  46 yo s/p CABGx 3 with PNA, pt with post op right weakness with CT (-), CTA demonstrates Likely Lt brain as well as posterior  circulation infarcts, embolic pattern. PMHx: DM, HTN   Assessment / Plan / Recommendation Clinical Impression  Patient presents with oropharyngeal swallow which appears functional at bedside for consistencies tested: thin liquids, pureed and medications whole with water. Per RN, patient with projectile vomiting this morning after being fed by family member. Has not had bowel movements recently. While oropharyngeal swallow appears within functional limits at bedside, there is still some risk of aspiration 2/2 reflux or vomiting. Recommend advancement per MD as tolerated from GI standpoint. SLP will follow acutely for diet tolerance and advancement as appropriate.    Aspiration Risk  Mild aspiration risk    Diet Recommendation Dysphagia 1 (Puree);Thin liquid   Liquid Administration via: Straw;Cup Medication Administration: Whole meds with liquid Supervision: Patient able to self feed Compensations: Slow rate;Small sips/bites Postural Changes: Seated upright at 90 degrees    Other  Recommendations Oral Care Recommendations: Oral care BID   Follow up Recommendations Other (comment) (TBD)      Frequency and Duration min 2x/week  2 weeks       Prognosis Prognosis for Safe Diet Advancement: Good      Swallow Study   General Date of Onset: 08/14/16 HPI: 46 yo s/p CABGx 3 with PNA, pt with post op right weakness with CT (-), CTA demonstrates Likely Lt brain as well as posterior circulation infarcts, embolic pattern. PMHx: DM, HTN Type of Study: Bedside Swallow Evaluation Previous Swallow Assessment: none per chart Diet Prior to this Study: NPO Temperature Spikes Noted: No Respiratory Status: Room  air History of Recent Intubation: Yes Length of Intubations (days): 1 days Date extubated: 08/09/16 Behavior/Cognition: Alert;Cooperative Oral Cavity Assessment: Within Functional Limits Oral Care Completed by SLP: No Oral Cavity - Dentition: Adequate natural dentition Vision: Functional  for self-feeding Self-Feeding Abilities: Able to feed self Patient Positioning: Upright in bed Baseline Vocal Quality: Normal Volitional Cough: Strong Volitional Swallow: Able to elicit    Oral/Motor/Sensory Function Overall Oral Motor/Sensory Function: Within functional limits   Ice Chips Ice chips: Within functional limits   Thin Liquid Thin Liquid: Within functional limits Presentation: Cup;Straw    Nectar Thick Nectar Thick Liquid: Not tested   Honey Thick Honey Thick Liquid: Not tested   Puree Puree: Within functional limits Presentation: Self Fed;Spoon   Solid   GO  Rondel BatonMary Beth Shayden Bobier, TennesseeMS CF-SLP Speech-Language Pathologist [phone] Solid: Not tested        Arlana LindauMary E Jerrilynn Mikowski 08/17/2016,3:19 PM

## 2016-08-17 NOTE — Progress Notes (Addendum)
Nurse just informed me patient had projectile vomiting. Will make NPO, obtain speech pathology consult as want to make sure she is not at risk for aspiration. 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 She may need a KUB if symptoms persist.  I have seen and examined the patient and agree with the assessment and plan as outlined.  Abdomen is non-distended and non-tender.  Keep NPO except for meds for now.  Check KUB if patient develops abdominal pain or refractory nausea/vomiting.  Purcell Nailslarence H Shawntay Prest, MD 08/17/2016 2:05 PM

## 2016-08-18 LAB — CBC
HCT: 31.3 % — ABNORMAL LOW (ref 36.0–46.0)
Hemoglobin: 10.2 g/dL — ABNORMAL LOW (ref 12.0–15.0)
MCH: 29.8 pg (ref 26.0–34.0)
MCHC: 32.6 g/dL (ref 30.0–36.0)
MCV: 91.5 fL (ref 78.0–100.0)
PLATELETS: 685 10*3/uL — AB (ref 150–400)
RBC: 3.42 MIL/uL — ABNORMAL LOW (ref 3.87–5.11)
RDW: 13.7 % (ref 11.5–15.5)
WBC: 15.7 10*3/uL — ABNORMAL HIGH (ref 4.0–10.5)

## 2016-08-18 LAB — BASIC METABOLIC PANEL
Anion gap: 7 (ref 5–15)
BUN: 16 mg/dL (ref 6–20)
CO2: 28 mmol/L (ref 22–32)
Calcium: 8.7 mg/dL — ABNORMAL LOW (ref 8.9–10.3)
Chloride: 103 mmol/L (ref 101–111)
Creatinine, Ser: 0.71 mg/dL (ref 0.44–1.00)
GFR calc Af Amer: >60 mL/min (ref >60)
Glucose, Bld: 148 mg/dL — ABNORMAL HIGH (ref 65–99)
Potassium: 4.2 mmol/L (ref 3.5–5.1)
SODIUM: 138 mmol/L (ref 135–145)

## 2016-08-18 LAB — GLUCOSE, CAPILLARY
GLUCOSE-CAPILLARY: 131 mg/dL — AB (ref 65–99)
GLUCOSE-CAPILLARY: 134 mg/dL — AB (ref 65–99)
Glucose-Capillary: 136 mg/dL — ABNORMAL HIGH (ref 65–99)
Glucose-Capillary: 131 mg/dL — ABNORMAL HIGH (ref 65–99)

## 2016-08-18 MED ORDER — METOPROLOL TARTRATE 25 MG PO TABS
25.0000 mg | ORAL_TABLET | Freq: Two times a day (BID) | ORAL | Status: DC
  Administered 2016-08-18 – 2016-08-19 (×3): 25 mg via ORAL
  Filled 2016-08-18 (×2): qty 1

## 2016-08-18 NOTE — Progress Notes (Signed)
      [address]       [place] [id number]             [phone]     CARDIOTHORACIC SURGERY PROGRESS NOTE  9 Days Post-Op  S/P Procedure(s) (LRB): CORONARY ARTERY BYPASS GRAFTING (CABG) x three,SVG to distal circ OM3, SVG to Om1, LIMA to LAD-using left internal mammary artery and right leg greater saphenous vein harvested endoscopically, Sternal plating - right side (N/A) TRANSESOPHAGEAL ECHOCARDIOGRAM (TEE) (N/A)  Subjective: Feels a little better.  No more nausea/emesis.  Wants to go home  Objective: Vital signs in last 24 hours: Temp:  [98.4 F (36.9 C)-98.6 F (37 C)] 98.6 F (37 C) (01/21 0459) Pulse Rate:  [98-103] 103 (01/21 0459) Cardiac Rhythm: Sinus tachycardia (01/21 0701) Resp:  [18] 18 (01/21 0459) BP: (130-134)/(78-83) 130/78 (01/21 0459) SpO2:  [96 %-97 %] 97 % (01/21 0459) Weight:  [170 lb 11.2 oz (77.4 kg)] 170 lb 11.2 oz (77.4 kg) (01/21 0459)  Physical Exam:  Rhythm:   sinus  Breath sounds: Diminished at bases  Heart sounds:  RRR  Incisions:  Clean and dry  Abdomen:  soft  Extremities:  warm   Intake/Output from previous day: 01/20 0701 - 01/21 0700 In: 250 [P.O.:240; I.V.:10] Out: 800 [Emesis/NG output:800] Intake/Output this shift: Total I/O In: 240 [P.O.:240] Out: -   Lab Results:  Recent Labs  08/17/16 0415 08/18/16 0451  WBC 15.2* 15.7*  HGB 10.5* 10.2*  HCT 32.4* 31.3*  PLT 611* 685*   BMET:  Recent Labs  08/17/16 0415 08/18/16 0451  NA 137 138  K 3.8 4.2  CL 99* 103  CO2 28 28  GLUCOSE 127* 148*  BUN 18 16  CREATININE 0.64 0.71  CALCIUM 8.8* 8.7*    CBG (last 3)   Recent Labs  08/17/16 2101 08/18/16 0632 08/18/16 1121  GLUCAP 126* 131* 136*   PT/INR:  No results for input(s): LABPROT, INR in the last 72 hours.  CXR:  N/A  Assessment/Plan: S/P Procedure(s) (LRB): CORONARY ARTERY BYPASS GRAFTING (CABG) x three,SVG to distal circ OM3, SVG to Om1, LIMA to LAD-using left internal mammary  artery and right leg greater saphenous vein harvested endoscopically, Sternal plating - right side (N/A) TRANSESOPHAGEAL ECHOCARDIOGRAM (TEE) (N/A)  1. CV - SR in the 90's. On Lopresor 12.5 mg bid, Lisinopril 2.5 mg daily, Plavix 75 mg daily, and ecasa 325 mg daily. Will increase Lopressor to 25 mg bid for better HR control. 2.  Pulmonary - On room air. Encourage incentive spirometer. 3. Volume Overload - On Lasix 40 mg daily. 4.  Acute blood loss anemia - H and stable at 10.2 and 31.3 5. ID- on Cefepime for probable pulmonary etiology 6. DM-CBGs 173/126/131. On Insulin. Pre op HGA1C 13.1. She will need close medical follow up after discharge. 7. Will need inpatient rehab when ready for discharge;continue with PT/OT as likely had post op stroke 8. GI-had vomiting yesterday. No nausea or vomiting recently. Will start clear liquids and slowly advance diet as tolerates. Speech path recommends dysphagia 1 (puree) thin liquid so will order.   ZIMMERMAN,DONIELLE MPA-C 08/17/2016,8:06 AM   I have seen and examined the patient and agree with the assessment and plan as outlined.  Purcell Nailslarence H Owen, MD 08/18/2016 12:34 PM

## 2016-08-19 ENCOUNTER — Encounter (HOSPITAL_COMMUNITY): Payer: Self-pay

## 2016-08-19 ENCOUNTER — Encounter (HOSPITAL_COMMUNITY): Payer: Self-pay | Admitting: Physical Medicine and Rehabilitation

## 2016-08-19 ENCOUNTER — Inpatient Hospital Stay (HOSPITAL_COMMUNITY)
Admission: RE | Admit: 2016-08-19 | Discharge: 2016-08-23 | DRG: 056 | Disposition: A | Discharge status: Home / Self Care | Payer: BLUE CROSS/BLUE SHIELD | Source: Intra-hospital | Attending: Physical Medicine & Rehabilitation | Admitting: Physical Medicine & Rehabilitation

## 2016-08-19 DIAGNOSIS — I69398 Other sequelae of cerebral infarction: Secondary | ICD-10-CM | POA: Diagnosis not present

## 2016-08-19 DIAGNOSIS — D62 Acute posthemorrhagic anemia: Secondary | ICD-10-CM

## 2016-08-19 DIAGNOSIS — Z7982 Long term (current) use of aspirin: Secondary | ICD-10-CM

## 2016-08-19 DIAGNOSIS — Z6836 Body mass index (BMI) 36.0-36.9, adult: Secondary | ICD-10-CM

## 2016-08-19 DIAGNOSIS — IMO0001 Reserved for inherently not codable concepts without codable children: Secondary | ICD-10-CM | POA: Diagnosis present

## 2016-08-19 DIAGNOSIS — R0789 Other chest pain: Secondary | ICD-10-CM | POA: Diagnosis not present

## 2016-08-19 DIAGNOSIS — Z79899 Other long term (current) drug therapy: Secondary | ICD-10-CM | POA: Diagnosis not present

## 2016-08-19 DIAGNOSIS — I6312 Cerebral infarction due to embolism of basilar artery: Secondary | ICD-10-CM | POA: Diagnosis not present

## 2016-08-19 DIAGNOSIS — E669 Obesity, unspecified: Secondary | ICD-10-CM | POA: Diagnosis present

## 2016-08-19 DIAGNOSIS — E1159 Type 2 diabetes mellitus with other circulatory complications: Secondary | ICD-10-CM | POA: Diagnosis not present

## 2016-08-19 DIAGNOSIS — D72829 Elevated white blood cell count, unspecified: Secondary | ICD-10-CM

## 2016-08-19 DIAGNOSIS — Z951 Presence of aortocoronary bypass graft: Secondary | ICD-10-CM | POA: Diagnosis not present

## 2016-08-19 DIAGNOSIS — E8770 Fluid overload, unspecified: Secondary | ICD-10-CM

## 2016-08-19 DIAGNOSIS — I2510 Atherosclerotic heart disease of native coronary artery without angina pectoris: Secondary | ICD-10-CM | POA: Diagnosis not present

## 2016-08-19 DIAGNOSIS — I69351 Hemiplegia and hemiparesis following cerebral infarction affecting right dominant side: Principal | ICD-10-CM

## 2016-08-19 DIAGNOSIS — I214 Non-ST elevation (NSTEMI) myocardial infarction: Secondary | ICD-10-CM

## 2016-08-19 DIAGNOSIS — J189 Pneumonia, unspecified organism: Secondary | ICD-10-CM

## 2016-08-19 DIAGNOSIS — Z9114 Patient's other noncompliance with medication regimen: Secondary | ICD-10-CM | POA: Diagnosis not present

## 2016-08-19 DIAGNOSIS — Z7901 Long term (current) use of anticoagulants: Secondary | ICD-10-CM

## 2016-08-19 DIAGNOSIS — I25810 Atherosclerosis of coronary artery bypass graft(s) without angina pectoris: Secondary | ICD-10-CM | POA: Diagnosis not present

## 2016-08-19 DIAGNOSIS — I6340 Cerebral infarction due to embolism of unspecified cerebral artery: Secondary | ICD-10-CM

## 2016-08-19 DIAGNOSIS — R269 Unspecified abnormalities of gait and mobility: Secondary | ICD-10-CM | POA: Diagnosis not present

## 2016-08-19 DIAGNOSIS — Z7902 Long term (current) use of antithrombotics/antiplatelets: Secondary | ICD-10-CM

## 2016-08-19 DIAGNOSIS — I10 Essential (primary) hypertension: Secondary | ICD-10-CM

## 2016-08-19 DIAGNOSIS — E1165 Type 2 diabetes mellitus with hyperglycemia: Secondary | ICD-10-CM | POA: Diagnosis not present

## 2016-08-19 DIAGNOSIS — F17210 Nicotine dependence, cigarettes, uncomplicated: Secondary | ICD-10-CM

## 2016-08-19 DIAGNOSIS — E8809 Other disorders of plasma-protein metabolism, not elsewhere classified: Secondary | ICD-10-CM | POA: Diagnosis not present

## 2016-08-19 DIAGNOSIS — E118 Type 2 diabetes mellitus with unspecified complications: Secondary | ICD-10-CM

## 2016-08-19 DIAGNOSIS — G8191 Hemiplegia, unspecified affecting right dominant side: Secondary | ICD-10-CM | POA: Diagnosis not present

## 2016-08-19 DIAGNOSIS — E785 Hyperlipidemia, unspecified: Secondary | ICD-10-CM | POA: Diagnosis not present

## 2016-08-19 DIAGNOSIS — J9589 Other postprocedural complications and disorders of respiratory system, not elsewhere classified: Secondary | ICD-10-CM

## 2016-08-19 DIAGNOSIS — Z794 Long term (current) use of insulin: Secondary | ICD-10-CM | POA: Diagnosis not present

## 2016-08-19 DIAGNOSIS — I639 Cerebral infarction, unspecified: Secondary | ICD-10-CM | POA: Diagnosis present

## 2016-08-19 DIAGNOSIS — E66811 Obesity, class 1: Secondary | ICD-10-CM | POA: Diagnosis present

## 2016-08-19 LAB — GLUCOSE, CAPILLARY
GLUCOSE-CAPILLARY: 101 mg/dL — AB (ref 65–99)
GLUCOSE-CAPILLARY: 132 mg/dL — AB (ref 65–99)
Glucose-Capillary: 119 mg/dL — ABNORMAL HIGH (ref 65–99)
Glucose-Capillary: 277 mg/dL — ABNORMAL HIGH (ref 65–99)

## 2016-08-19 MED ORDER — LISINOPRIL 5 MG PO TABS
5.0000 mg | ORAL_TABLET | Freq: Every day | ORAL | Status: DC
  Administered 2016-08-20: 5 mg via ORAL
  Filled 2016-08-19: qty 1

## 2016-08-19 MED ORDER — TRAMADOL HCL 50 MG PO TABS: 50.0000 mg | ORAL_TABLET | Freq: Four times a day (QID) | ORAL | Status: DC | PRN

## 2016-08-19 MED ORDER — ASPIRIN 325 MG PO TBEC: 325.0000 mg | DELAYED_RELEASE_TABLET | Freq: Every day | ORAL | 0 refills | Status: DC

## 2016-08-19 MED ORDER — POTASSIUM CHLORIDE CRYS ER 10 MEQ PO TBCR: 10.0000 meq | EXTENDED_RELEASE_TABLET | Freq: Every day | ORAL | 0 refills | Status: DC

## 2016-08-19 MED ORDER — INSULIN DETEMIR 100 UNIT/ML ~~LOC~~ SOLN
30.0000 [IU] | Freq: Every day | SUBCUTANEOUS | 11 refills | Status: DC
Start: 2016-08-20 — End: 2016-08-23

## 2016-08-19 MED ORDER — ASPIRIN EC 325 MG PO TBEC
325.0000 mg | DELAYED_RELEASE_TABLET | Freq: Every day | ORAL | Status: DC
  Administered 2016-08-20 – 2016-08-23 (×4): 325 mg via ORAL
  Filled 2016-08-19 (×4): qty 1

## 2016-08-19 MED ORDER — SENNOSIDES-DOCUSATE SODIUM 8.6-50 MG PO TABS
1.0000 | ORAL_TABLET | Freq: Every day | ORAL | Status: DC
  Administered 2016-08-21 – 2016-08-22 (×2): 1 via ORAL
  Filled 2016-08-19 (×5): qty 1

## 2016-08-19 MED ORDER — FUROSEMIDE 40 MG PO TABS
40.0000 mg | ORAL_TABLET | Freq: Every day | ORAL | Status: DC
  Administered 2016-08-20: 40 mg via ORAL
  Filled 2016-08-19: qty 1

## 2016-08-19 MED ORDER — TRAZODONE HCL 50 MG PO TABS
25.0000 mg | ORAL_TABLET | Freq: Every evening | ORAL | Status: DC | PRN
  Administered 2016-08-21 – 2016-08-22 (×2): 50 mg via ORAL
  Filled 2016-08-19 (×2): qty 1

## 2016-08-19 MED ORDER — CLOPIDOGREL BISULFATE 75 MG PO TABS: 75.0000 mg | ORAL_TABLET | Freq: Every day | ORAL | 1 refills | Status: DC

## 2016-08-19 MED ORDER — SODIUM CHLORIDE 0.9% FLUSH
10.0000 mL | INTRAVENOUS | Status: DC | PRN
  Administered 2016-08-20 – 2016-08-21 (×3): 20 mL
  Administered 2016-08-22: 10 mL
  Filled 2016-08-19 (×4): qty 40

## 2016-08-19 MED ORDER — GUAIFENESIN ER 600 MG PO TB12: 600.0000 mg | ORAL_TABLET | Freq: Two times a day (BID) | ORAL | Status: DC | PRN

## 2016-08-19 MED ORDER — FUROSEMIDE 40 MG PO TABS: 40.0000 mg | ORAL_TABLET | Freq: Every day | ORAL | 0 refills | Status: DC

## 2016-08-19 MED ORDER — ONDANSETRON HCL 4 MG PO TABS
4.0000 mg | ORAL_TABLET | Freq: Four times a day (QID) | ORAL | Status: DC | PRN
  Administered 2016-08-21: 4 mg via ORAL
  Filled 2016-08-19: qty 1

## 2016-08-19 MED ORDER — METOPROLOL TARTRATE 25 MG PO TABS
25.0000 mg | ORAL_TABLET | Freq: Two times a day (BID) | ORAL | Status: DC
  Administered 2016-08-19 – 2016-08-20 (×2): 25 mg via ORAL
  Filled 2016-08-19 (×2): qty 1

## 2016-08-19 MED ORDER — ENOXAPARIN SODIUM 40 MG/0.4ML ~~LOC~~ SOLN
40.0000 mg | SUBCUTANEOUS | Status: DC
  Administered 2016-08-19 – 2016-08-22 (×4): 40 mg via SUBCUTANEOUS
  Filled 2016-08-19 (×6): qty 0.4

## 2016-08-19 MED ORDER — DIPHENHYDRAMINE HCL 12.5 MG/5ML PO ELIX: 12.5000 mg | ORAL_SOLUTION | Freq: Four times a day (QID) | ORAL | Status: DC | PRN

## 2016-08-19 MED ORDER — INSULIN DETEMIR 100 UNIT/ML ~~LOC~~ SOLN
30.0000 [IU] | Freq: Every day | SUBCUTANEOUS | Status: DC
  Administered 2016-08-20 – 2016-08-23 (×4): 30 [IU] via SUBCUTANEOUS
  Filled 2016-08-19 (×4): qty 0.3

## 2016-08-19 MED ORDER — ENOXAPARIN SODIUM 40 MG/0.4ML ~~LOC~~ SOLN
40.0000 mg | Freq: Every day | SUBCUTANEOUS | Status: DC
Start: 2016-08-19 — End: 2016-08-23

## 2016-08-19 MED ORDER — ACETAMINOPHEN 325 MG PO TABS
325.0000 mg | ORAL_TABLET | ORAL | Status: DC | PRN
  Administered 2016-08-21: 650 mg via ORAL
  Filled 2016-08-19: qty 2

## 2016-08-19 MED ORDER — LISINOPRIL 5 MG PO TABS
5.0000 mg | ORAL_TABLET | Freq: Every day | ORAL | Status: DC
  Administered 2016-08-19: 5 mg via ORAL
  Filled 2016-08-19: qty 1

## 2016-08-19 MED ORDER — INSULIN ASPART 100 UNIT/ML ~~LOC~~ SOLN: 6.0000 [IU] | Freq: Three times a day (TID) | SUBCUTANEOUS | 11 refills | Status: DC

## 2016-08-19 MED ORDER — FLEET ENEMA 7-19 GM/118ML RE ENEM
1.0000 | ENEMA | Freq: Once | RECTAL | Status: AC | PRN
  Administered 2016-08-21: 1 via RECTAL
  Filled 2016-08-19: qty 1

## 2016-08-19 MED ORDER — ATORVASTATIN CALCIUM 80 MG PO TABS: 80.0000 mg | ORAL_TABLET | Freq: Every day | ORAL | 1 refills | Status: DC

## 2016-08-19 MED ORDER — ALUM & MAG HYDROXIDE-SIMETH 200-200-20 MG/5ML PO SUSP: 30.0000 mL | ORAL | Status: DC | PRN

## 2016-08-19 MED ORDER — ONDANSETRON HCL 4 MG/2ML IJ SOLN: 4.0000 mg | Freq: Four times a day (QID) | INTRAMUSCULAR | Status: DC | PRN

## 2016-08-19 MED ORDER — BISACODYL 10 MG RE SUPP: 10.0000 mg | Freq: Every day | RECTAL | Status: DC | PRN

## 2016-08-19 MED ORDER — LISINOPRIL 5 MG PO TABS: 5.0000 mg | ORAL_TABLET | Freq: Every day | ORAL | Status: DC

## 2016-08-19 MED ORDER — TRAMADOL HCL 50 MG PO TABS
50.0000 mg | ORAL_TABLET | Freq: Four times a day (QID) | ORAL | Status: DC | PRN
  Administered 2016-08-20: 50 mg via ORAL
  Filled 2016-08-19 (×2): qty 1

## 2016-08-19 MED ORDER — ATORVASTATIN CALCIUM 80 MG PO TABS
80.0000 mg | ORAL_TABLET | Freq: Every day | ORAL | Status: DC
  Administered 2016-08-20 – 2016-08-22 (×3): 80 mg via ORAL
  Filled 2016-08-19 (×3): qty 1

## 2016-08-19 MED ORDER — INSULIN ASPART 100 UNIT/ML ~~LOC~~ SOLN: 0.0000 [IU] | Freq: Three times a day (TID) | SUBCUTANEOUS | 11 refills | Status: DC

## 2016-08-19 MED ORDER — GUAIFENESIN-DM 100-10 MG/5ML PO SYRP: 5.0000 mL | ORAL_SOLUTION | Freq: Four times a day (QID) | ORAL | Status: DC | PRN

## 2016-08-19 MED ORDER — INSULIN ASPART 100 UNIT/ML ~~LOC~~ SOLN
6.0000 [IU] | Freq: Three times a day (TID) | SUBCUTANEOUS | Status: DC
  Administered 2016-08-20 – 2016-08-23 (×6): 6 [IU] via SUBCUTANEOUS

## 2016-08-19 MED ORDER — SODIUM CHLORIDE 0.9% FLUSH
10.0000 mL | Freq: Two times a day (BID) | INTRAVENOUS | Status: DC
  Administered 2016-08-23: 10 mL

## 2016-08-19 MED ORDER — POTASSIUM CHLORIDE CRYS ER 10 MEQ PO TBCR
10.0000 meq | EXTENDED_RELEASE_TABLET | Freq: Every day | ORAL | Status: DC
  Administered 2016-08-20 – 2016-08-21 (×2): 10 meq via ORAL
  Filled 2016-08-19 (×2): qty 1

## 2016-08-19 MED ORDER — PANTOPRAZOLE SODIUM 40 MG PO TBEC
40.0000 mg | DELAYED_RELEASE_TABLET | Freq: Every day | ORAL | Status: DC
  Administered 2016-08-20 – 2016-08-23 (×4): 40 mg via ORAL
  Filled 2016-08-19 (×4): qty 1

## 2016-08-19 MED ORDER — CLOPIDOGREL BISULFATE 75 MG PO TABS
75.0000 mg | ORAL_TABLET | Freq: Every day | ORAL | Status: DC
  Administered 2016-08-20 – 2016-08-23 (×4): 75 mg via ORAL
  Filled 2016-08-19 (×4): qty 1

## 2016-08-19 MED ORDER — INSULIN ASPART 100 UNIT/ML ~~LOC~~ SOLN
0.0000 [IU] | Freq: Three times a day (TID) | SUBCUTANEOUS | Status: DC
  Administered 2016-08-19 – 2016-08-20 (×2): 2 [IU] via SUBCUTANEOUS
  Administered 2016-08-20: 4 [IU] via SUBCUTANEOUS
  Administered 2016-08-20: 2 [IU] via SUBCUTANEOUS
  Administered 2016-08-21 (×2): 8 [IU] via SUBCUTANEOUS
  Administered 2016-08-22: 4 [IU] via SUBCUTANEOUS
  Administered 2016-08-22: 2 [IU] via SUBCUTANEOUS
  Administered 2016-08-22: 12 [IU] via SUBCUTANEOUS
  Administered 2016-08-23 (×2): 2 [IU] via SUBCUTANEOUS

## 2016-08-19 MED ORDER — METOPROLOL TARTRATE 25 MG PO TABS: 25.0000 mg | ORAL_TABLET | Freq: Two times a day (BID) | ORAL | Status: DC

## 2016-08-19 NOTE — Discharge Instructions (Signed)
Coronary Artery Bypass Grafting, Care After °Refer to this sheet in the next few weeks. These instructions provide you with information on caring for yourself after your procedure. Your health care provider may also give you more specific instructions. Your treatment has been planned according to current medical practices, but problems sometimes occur. Call your health care provider if you have any problems or questions after your procedure. °WHAT TO EXPECT AFTER THE PROCEDURE °Recovery from surgery will be different for everyone. Some people feel well after 3 or 4 weeks, while for others it takes longer. After your procedure, it is typical to have the following: °· Nausea and a lack of appetite.   °· Constipation. °· Weakness and fatigue.   °· Depression or irritability.   °· Pain or discomfort at your incision site. °HOME CARE INSTRUCTIONS °· Take medicines only as directed by your health care provider. Do not stop taking medicines or start any new medicines without first checking with your health care provider. °· Take your pulse as directed by your health care provider. °· Perform deep breathing as directed by your health care provider. If you were given a device called an incentive spirometer, use it to practice deep breathing several times a day. Support your chest with a pillow or your arms when you take deep breaths or cough. °· Keep incision areas clean, dry, and protected. Remove or change any bandages (dressings) only as directed by your health care provider. You may have skin adhesive strips over the incision areas. Do not take the strips off. They will fall off on their own. °· Check incision areas daily for any swelling, redness, or drainage. °· If incisions were made in your legs, do the following: °¨ Avoid crossing your legs.   °¨ Avoid sitting for long periods of time. Change positions every 30 minutes.   °¨ Elevate your legs when you are sitting. °· Wear compression stockings as directed by your  health care provider. These stockings help keep blood clots from forming in your legs. °· Take showers once your health care provider approves. Until then, only take sponge baths. Pat incisions dry. Do not rub incisions with a washcloth or towel. Do not take baths, swim, or use a hot tub until your health care provider approves. °· Eat foods that are high in fiber, such as raw fruits and vegetables, whole grains, beans, and nuts. Meats should be lean cut. Avoid canned, processed, and fried foods. °· Drink enough fluid to keep your urine clear or pale yellow. °· Weigh yourself every day. This helps identify if you are retaining fluid that may make your heart and lungs work harder. °· Rest and limit activity as directed by your health care provider. You may be instructed to: °¨ Stop any activity at once if you have chest pain, shortness of breath, irregular heartbeats, or dizziness. Get help right away if you have any of these symptoms. °¨ Move around frequently for short periods or take short walks as directed by your health care provider. Increase your activities gradually. You may need physical therapy or cardiac rehabilitation to help strengthen your muscles and build your endurance. °¨ Avoid lifting, pushing, or pulling anything heavier than 10 lb (4.5 kg) for at least 6 weeks after surgery. °· Do not drive until your health care provider approves.  °· Ask your health care provider when you may return to work. °· Ask your health care provider when you may resume sexual activity. °· Keep all follow-up visits as directed by your health care   provider. This is important. °SEEK MEDICAL CARE IF: °· You have swelling, redness, increasing pain, or drainage at the site of an incision. °· You have a fever. °· You have swelling in your ankles or legs. °· You have pain in your legs.   °· You gain 2 or more pounds (0.9 kg) a day. °· You are nauseous or vomit. °· You have diarrhea.  °SEEK IMMEDIATE MEDICAL CARE IF: °· You have  chest pain that goes to your jaw or arms. °· You have shortness of breath.   °· You have a fast or irregular heartbeat.   °· You notice a "clicking" in your breastbone (sternum) when you move.   °· You have numbness or weakness in your arms or legs. °· You feel dizzy or light-headed.   °MAKE SURE YOU: °· Understand these instructions. °· Will watch your condition. °· Will get help right away if you are not doing well or get worse. °This information is not intended to replace advice given to you by your health care provider. Make sure you discuss any questions you have with your health care provider. °Document Released: 02/01/2005 Document Revised: 08/05/2014 Document Reviewed: 12/22/2012 °Elsevier Interactive Patient Education © 2017 [employer] ° °

## 2016-08-19 NOTE — Progress Notes (Signed)
Removed Pt's wires removed with no resistance at 1430. Pt. Tolerated procedure well.

## 2016-08-19 NOTE — Progress Notes (Signed)
Inpatient Rehabilitation  [facility].  I have medical approval from [place], [place].  Pt. and husband are agreeable.  Pt's RN and Donn PieriniKristi Webster, [facility] CenterRNCM are made aware of the plan.  Please call if questions.  Weldon PickingSusan Kandise Riehle PT Inpatient Rehab Admissions Coordinator Cell [phone] Office [phone]

## 2016-08-19 NOTE — Progress Notes (Signed)
Patient received at 1900 via bed alert and oriented x4. No complaint of pain. Patient and husband oriented to room and call bell system. Patient and husband verbalized understanding of admission process. Continue with plan of care.  Lindsey NeerJoyce, Morty Ortwein S

## 2016-08-19 NOTE — Progress Notes (Signed)
1303 Pt has walked twice today. Once with PT and once with family. Will continue to follow. Luetta NuttingCharlene Kutler Vanvranken RN BSN 08/19/2016 1:04 PM

## 2016-08-19 NOTE — Care Management Note (Signed)
Case Management Note Donn PieriniKristi Orest Dygert RN, BSN Unit 2W-Case Manager [phone]  Patient Details  Name: [name] MRN: [id number] Date of Birth: 08/29/1970  Subjective/Objective:   Pt admitted with c/p and HTN-found MVD- s/p CABGx3 with post op CVA- tx from 2S ICU to 2W on 08/16/16                 Action/Plan: PTA pt lived at home with spouse- mother also available to assist after discharge- CIR consulted for possible admission- awaiting insurance approval  Expected Discharge Date:  08/19/16               Expected Discharge Plan:  IP Rehab Facility  In-House Referral:  Clinical Social Work  Discharge planning Services  CM Consult  Post Acute Care Choice:  NA Choice offered to:  NA  DME Arranged:    DME Agency:     HH Arranged:    HH Agency:     Status of Service:  Completed, signed off  If discussed at [employer] Length of Stay Meetings, dates discussed:    Discharge Disposition: [facility]   Additional Comments:  08/19/16- 1500- Donn PieriniKristi Roneisha Stern RN, CM- notified by Weldon PickingSusan Blankenship CIR admission coordinator- that pt has insurance approval for CIR- and they have a bed available today- per MD note pt stable for tx to CIR. Pt will d/c later today to CIR- epw have been removed.   Darrold SpanWebster, Audia Amick Hall, RN 08/19/2016, 3:22 PM

## 2016-08-19 NOTE — H&P (Signed)
Physical Medicine and Rehabilitation Admission H&P    Chief Complaint  Patient presents with  . Right sided weakness, high level cognitive deficits.     HPI: [name] is a 46 y.o. female with history of HTN, headaches, T2DM--medication non-comliance due to cost who was admitted on 08/07/16 with severe CP and History taken from patient and chart review. She reports family members available for assistance post-discharge. Cardiac cath done revealing severe multivessel disease. She underwent CABG X 3 with sternal plating. Post op with lethargy and weakness. She was found to have right sided weakness with neglect.  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 CTA head/neck done revaling proximal BA thrombus, BS and L-VA stenosis and remote pontine and cerebellar infarcts. Diabetes coordinator following to help manage uncontrolled BS. Intraoperative TEE without PFO or thrombus. BLE dopplers negative for DVT. Dr. Erlinda Hong felt stroke likely embolic due to surgery or multiple stroke risk factors. To continue ASA/Plavix and consider repeat CTA neck in 2-3 months to follow up on BA thrombus.   Fluid overload treated with IV diuresis with improvement. She did have projectile vomiting on 01/20 with recommendations for dysphagia 1, thin liquids due to concerns of reflux/vomiting.  GI symptoms have improved and diet advanced to regular today. Therapy ongoing and patient limited by right sided weakness, delayed processing and decreased awareness of deficits.   CIR recommended for follow up therapy.    Review of Systems  Constitutional: Negative for fever.  HENT: Negative for hearing loss and tinnitus.   Eyes: Negative for blurred vision and double vision.  Respiratory: Negative for shortness of breath.   Cardiovascular: Negative for chest pain and palpitations.  Gastrointestinal: Negative for diarrhea, heartburn and nausea.  Genitourinary: Negative for dysuria and  urgency.  Musculoskeletal: Negative for back pain, joint pain and myalgias.  Skin: Negative for itching and rash.  Neurological: Positive for sensory change (numbness all right side with tingling of hands and feet. ), speech change and focal weakness. Negative for dizziness and headaches.  Psychiatric/Behavioral: Negative for memory loss. The patient does not have insomnia.   All other systems reviewed and are negative.     Past Medical History:  Diagnosis Date  . History of kidney stones    "passed them"  . Hypertension   . Migraine    "daily" (08/07/2016)  . Pancreatic injury    "damage to pancrease related to gallbladder OR"  . Pneumonia X 1  . Type II diabetes mellitus ([place])     Past Surgical History:  Procedure Laterality Date  . CARDIAC CATHETERIZATION  08/07/2016  . CARDIAC CATHETERIZATION N/A 08/07/2016   Procedure: Left Heart Cath and Coronary Angiography;  Surgeon: Belva Crome, MD;  Location: [facility];  Service: Cardiovascular;  Laterality: N/A;  . CARDIAC CATHETERIZATION N/A 08/07/2016   Procedure: Intravascular Pressure Wire/FFR Study;  Surgeon: Belva Crome, MD;  Location: [facility];  Service: Cardiovascular;  Laterality: N/A;  . CORONARY ARTERY BYPASS GRAFT N/A 08/09/2016   Procedure: CORONARY ARTERY BYPASS GRAFTING (CABG) x three,SVG to distal circ OM3, SVG to Om1, LIMA to LAD-using left internal mammary artery and right leg greater saphenous vein harvested endoscopically, Sternal plating - right side;  Surgeon: Grace Isaac, MD;  Location: [facility];  Service: Open Heart Surgery;  Laterality: N/A;  . FACIAL RECONSTRUCTION SURGERY  2009   "facial fractures"  . FRACTURE SURGERY    . LAPAROSCOPIC CHOLECYSTECTOMY    .  TEE WITHOUT CARDIOVERSION N/A 08/09/2016   Procedure: TRANSESOPHAGEAL ECHOCARDIOGRAM (TEE);  Surgeon: Grace Isaac, MD;  Location: [facility];  Service: Open Heart Surgery;  Laterality: N/A;  . TUBAL LIGATION      Family History    Problem Relation Age of Onset  . Hypertension Mother   . Diabetes Mother   . Hypertension Father   . Diabetes Father   . Heart attack Father 62    had CABG    Social History:  Married. Works as a Librarian, academic for [employer]. She reports that she has been smoking Cigarettes--about   She has a 10.00 pack-year smoking history. She has never used smokeless tobacco. She reports that she does not drink alcohol or use drugs.    Allergies  Allergen Reactions  . Eugenol Hives and Other (See Comments)    Burns mouth  . Hydrochlorothiazide Anaphylaxis  . Morphine And Related Anaphylaxis  . Percocet [Oxycodone-Acetaminophen] Anaphylaxis and Nausea And Vomiting  . Shellfish Allergy Anaphylaxis  . Sulfa Antibiotics Anaphylaxis  . Tomato Anaphylaxis    Medications Prior to Admission  Medication Sig Dispense Refill  . insulin lispro (HUMALOG) 100 UNIT/ML injection Inject 5-20 Units into the skin 3 (three) times daily before meals. Takes 5-20 units as directed per sliding scale    . triamcinolone (NASACORT ALLERGY 24HR) 55 MCG/ACT AERO nasal inhaler Place 1 spray into both nostrils daily.     . insulin NPH-regular Human (NOVOLIN 70/30) (70-30) 100 UNIT/ML injection Inject 25-75 Units into the skin 2 (two) times daily. 75 units in the morning and 25 units in the evening      Home: [place] expects to be discharged to:: Private residence Living Arrangements: Spouse/significant other Available Help at Discharge: Family, Available 24 hours/day Type of Home: House Home Access: Stairs to enter [employer] of Steps: 2 (at front entrance) Home Layout: One level Bathroom Shower/Tub: Tub/shower unit, Multimedia programmer: Associate Professor Accessibility: Yes Home Equipment: None Additional Comments: pt lives with spouse who works but mom can assist at [place]: Prior Function Level of Independence: Independent  Functional Status:   Mobility: Bed Mobility Overal bed mobility: Needs Assistance Bed Mobility: Rolling, Sidelying to Sit, Sit to Sidelying Rolling: Modified independent (Device/Increase time) Sidelying to sit: Min assist Sit to supine: Mod assist Sit to sidelying: Mod assist General bed mobility comments: verbal and tactile cues, assist to elevate trunk and maintain sternal precaution Transfers Overall transfer level: Needs assistance Equipment used: Rolling walker (2 wheeled) Transfers: Sit to/from Stand Sit to Stand: Min guard Stand pivot transfers: Min guard General transfer comment: verbal cues for sequencing and safety. Min guard/tactile cues to control sit without UE assist. Ambulation/Gait Ambulation/Gait assistance: Min assist Ambulation Distance (Feet): 120 Feet Assistive device: Rolling walker (2 wheeled) Gait Pattern/deviations: Drifts right/left, Step-through pattern, Decreased stride length General Gait Details: Assist to manage RW. Verbal cues to attend to R hand grip on RW. Gait velocity: decreased Gait velocity interpretation: Below normal speed for age/gender    ADL: ADL Overall ADL's : Needs assistance/impaired Eating/Feeding: Minimal assistance Grooming: Moderate assistance Upper Body Bathing: Moderate assistance Lower Body Bathing: Maximal assistance Upper Body Dressing : Maximal assistance Lower Body Dressing: Maximal assistance Toilet Transfer: +2 for physical assistance, Minimal assistance, Stand-pivot, BSC Toileting- Clothing Manipulation and Hygiene: Maximal assistance, Sit to/from stand Toileting - Clothing Manipulation Details (indicate cue type and reason): Pt able to assist with pulling pants over Lt hip  Functional mobility during ADLs:  Minimal assistance, +2 for physical assistance General ADL Comments: Pt able to recall 2/5 sternal precautions.     Cognition: Cognition Overall Cognitive Status: Impaired/Different from baseline Orientation Level: Oriented  X4 Cognition Arousal/Alertness: Awake/alert Behavior During Therapy: Flat affect Overall Cognitive Status: Impaired/Different from baseline Area of Impairment: Attention, Memory, Safety/judgement, Problem solving Orientation Level: Disoriented to, Time Current Attention Level: Selective Memory: Decreased recall of precautions Following Commands: Follows one step commands with increased time Safety/Judgement: Decreased awareness of safety, Decreased awareness of deficits Awareness: Intellectual Problem Solving: Slow processing, Requires verbal cues, Difficulty sequencing General Comments: pt able to recall 2/5 precautions   Blood pressure (!) 149/95, pulse 85, temperature 98 F (36.7 C), temperature source Oral, resp. rate 18, height '4\' 11"'  (1.499 m), weight 77.5 kg (170 lb 14.4 oz), last menstrual period 07/19/2016, SpO2 100 %. Physical Exam  Nursing note and vitals reviewed. Constitutional: She is oriented to person, place, and time. She appears well-developed and well-nourished. No distress.  HENT:  Head: Normocephalic and atraumatic.  Eyes: Conjunctivae and EOM are normal. Pupils are equal, round, and reactive to light.  Neck: Normal range of motion. Neck supple.  Cardiovascular: Normal rate and regular rhythm.   No murmur heard. Respiratory: Effort normal and breath sounds normal. No stridor.  GI: Soft. Bowel sounds are normal. She exhibits no distension. There is no tenderness.  Musculoskeletal: She exhibits edema (min edema RLE. Incision right shin clean, dry and intact.). She exhibits no tenderness.  Neurological: She is alert and oriented to person, place, and time. A cranial nerve deficit is present.  Flat affect.  Able to answer basic biographic questions without difficulty but conflicting answers regarding medical hx. Sensation diminished to light touch RUE/RLE Motor: LLE: HF 4/5, KE 4/5, ADF/PF 4-/5 LUE: 4+/5 proximal to distal RLE: HF 4/5, KE 4/5, ADF/PF 4/5 RUE:  4+/5 proximal to distal  Skin: Skin is warm and dry. No rash noted. She is not diaphoretic. No erythema.  Midline chest incision clean and try--some gaping at edges. Pacer sutures in place.   Psychiatric: Her affect is blunt. She is slowed. She expresses inappropriate judgment. She exhibits abnormal remote memory.  Appears to have improved    Results for orders placed or performed during the hospital encounter of 08/07/16 (from the past 48 hour(s))  Glucose, capillary     Status: Abnormal   Collection Time: 08/17/16 10:44 AM  Result Value Ref Range   Glucose-Capillary 184 (H) 65 - 99 mg/dL  Glucose, capillary     Status: Abnormal   Collection Time: 08/17/16  4:03 PM  Result Value Ref Range   Glucose-Capillary 173 (H) 65 - 99 mg/dL  Glucose, capillary     Status: Abnormal   Collection Time: 08/17/16  9:01 PM  Result Value Ref Range   Glucose-Capillary 126 (H) 65 - 99 mg/dL   Comment 1 Notify RN   CBC     Status: Abnormal   Collection Time: 08/18/16  4:51 AM  Result Value Ref Range   WBC 15.7 (H) 4.0 - 10.5 K/uL   RBC 3.42 (L) 3.87 - 5.11 MIL/uL   Hemoglobin 10.2 (L) 12.0 - 15.0 g/dL   HCT 31.3 (L) 36.0 - 46.0 %   MCV 91.5 78.0 - 100.0 fL   MCH 29.8 26.0 - 34.0 pg   MCHC 32.6 30.0 - 36.0 g/dL   RDW 13.7 11.5 - 15.5 %   Platelets 685 (H) 150 - 400 K/uL  Basic metabolic panel  Status: Abnormal   Collection Time: 08/18/16  4:51 AM  Result Value Ref Range   Sodium 138 135 - 145 mmol/L   Potassium 4.2 3.5 - 5.1 mmol/L   Chloride 103 101 - 111 mmol/L   CO2 28 22 - 32 mmol/L   Glucose, Bld 148 (H) 65 - 99 mg/dL   BUN 16 6 - 20 mg/dL   Creatinine, Ser 0.71 0.44 - 1.00 mg/dL   Calcium 8.7 (L) 8.9 - 10.3 mg/dL   GFR calc non Af Amer >60 >60 mL/min   GFR calc Af Amer >60 >60 mL/min    Comment: (NOTE) The eGFR has been calculated using the CKD EPI equation. This calculation has not been validated in all clinical situations. eGFR's persistently <60 mL/min signify possible Chronic  Kidney Disease.    Anion gap 7 5 - 15  Glucose, capillary     Status: Abnormal   Collection Time: 08/18/16  6:32 AM  Result Value Ref Range   Glucose-Capillary 131 (H) 65 - 99 mg/dL   Comment 1 Notify RN    Comment 2 Document in Chart   Glucose, capillary     Status: Abnormal   Collection Time: 08/18/16 11:21 AM  Result Value Ref Range   Glucose-Capillary 136 (H) 65 - 99 mg/dL   Comment 1 Document in Chart   Glucose, capillary     Status: Abnormal   Collection Time: 08/18/16  4:26 PM  Result Value Ref Range   Glucose-Capillary 131 (H) 65 - 99 mg/dL  Glucose, capillary     Status: Abnormal   Collection Time: 08/18/16  9:13 PM  Result Value Ref Range   Glucose-Capillary 134 (H) 65 - 99 mg/dL  Glucose, capillary     Status: Abnormal   Collection Time: 08/19/16  6:14 AM  Result Value Ref Range   Glucose-Capillary 119 (H) 65 - 99 mg/dL   No results found.     Medical Problem List and Plan: 1.  Weakness, poor activity tolerance, abnormality of gait secondary to multiple small vessel infarcts. 2.  DVT Prophylaxis/Anticoagulation: Pharmaceutical: Lovenox 3. Pain Management: continue tramadol prn for pain.  4. Mood: LCSW to follow for evaluation and support.  5. Neuropsych: This patient is capable of making decisions on her own behalf. 6. Skin/Wound Care: Routine pressure relief measures 7. Fluids/Electrolytes/Nutrition: Monitor I/O. Check lytes in am. 8. CAD s/p CABG: On ASA, plavix, lipitor and BB 9. HTN:  Monitor BP bid. On lasix, metoprolol and Prinivil--dose increased today for tighter control.  10.T2DM: Monitor BS ac/hs and use SSI for elevated BS. Reports that she can not afford lantus due to cost and was using NPH at home.  11. Leucocytosis: Monitor for signs of infection. Has completed 7 day course of antibiotic therapy today. WBC on upward trend.--recheck in am. Follow up CXR in am.  12. Volume overload: Check weights daily. Continue lasix and monitor for signs of  overload.  13. ABLA: recheck CBC in am.  14. Morbid obesity: Encourage weight loss   Post Admission Physician Evaluation: 1. Preadmission assessment reviewed and changes made below. 2. Functional deficits secondary  to Multiple small small vessel infarcts. 3. Patient is admitted to receive collaborative, interdisciplinary care between the physiatrist, rehab nursing staff, and therapy team. 4. Patient's level of medical complexity and substantial therapy needs in context of that medical necessity cannot be provided at a lesser intensity of care such as a SNF. 5. Patient has experienced substantial functional loss from his/her baseline which was  documented above under the "Functional History" and "Functional Status" headings.  Judging by the patient's diagnosis, physical exam, and functional history, the patient has potential for functional progress which will result in measurable gains while on inpatient rehab.  These gains will be of substantial and practical use upon discharge  in facilitating mobility and self-care at the household level. 6. Physiatrist will provide 24 hour management of medical needs as well as oversight of the therapy plan/treatment and provide guidance as appropriate regarding the interaction of the two. 7. The Preadmission Screening has been reviewed and patient status is unchanged unless otherwise stated above. 8. 24 hour rehab nursing will assist with safety, disease management and patient education  and help integrate therapy concepts, techniques,education, etc. 9. PT will assess and treat for/with: Lower extremity strength, range of motion, stamina, balance, functional mobility, safety, adaptive techniques and equipment, woundcare, coping skills, pain control, stroke education.   Goals are: Mod I. 10. OT will assess and treat for/with: ADL's, functional mobility, safety, upper extremity strength, adaptive techniques and equipment, wound mgt, ego support, and community  reintegration.   Goals are: Mod I. Therapy may proceed with showering this patient. 11. Case Management and Social Worker will assess and treat for psychological issues and discharge planning. 12. Team conference will be held weekly to assess progress toward goals and to determine barriers to discharge. 13. Patient will receive at least 3 hours of therapy per day at least 5 days per week. 14. ELOS: 10-15 days.       15. Prognosis:  good  Delice Lesch, MD, [address] [place] 08/19/2016

## 2016-08-19 NOTE — Progress Notes (Signed)
Speech Language Pathology Treatment: Dysphagia  Patient Details Name: [name] MRN: [id number] DOB: 09-Dec-1970 Today's Date: 08/19/2016 Time: 9622-2979 SLP Time Calculation (min) (ACUTE ONLY): 10 min  Assessment / Plan / Recommendation Clinical Impression  Skilled observation with pt consuming thin liquids via straw/cup and solid consistency without overt s/s of aspiration with independent po intake and pt denies any further dysphagia as noted on BSE (vomiting/regurgitation); Recommend Regular/thin diet; ST will s/o at this time d/t pt tolerating current diet well per pt/nursing and diet upgrade observation without s/s of aspiration noted.   HPI HPI: 46 yo s/p CABGx 3 with PNA, pt with post op right weakness with CT (-), CTA demonstrates Likely Lt brain as well as posterior circulation infarcts, embolic pattern. PMHx: DM, HTN      SLP Plan  All goals met     Recommendations  Diet recommendations: Regular;Thin liquid Liquids provided via: Cup;Straw Medication Administration: Whole meds with liquid Supervision: Patient able to self feed Compensations: Slow rate;Small sips/bites                Oral Care Recommendations: Oral care BID Follow up Recommendations: Other (comment) Plan: All goals met                       ADAMS,PAT, M.S., CCC-SLP 08/19/2016, 4:14 PM

## 2016-08-19 NOTE — Progress Notes (Signed)
[id number] Brief ed as pt getting ready to transfer to Rehab. Discussed CRP 2 and will refer to [place] program. Gave diabetic diet and discussed importance of getting A1C (13.3) down. Discussed sternal precautions. Pt stated she will have no problem not smoking but husband wanted to quit also. Gave him smoking cessation handout and fake cigarette. Pt did not want to watch discharge video at this time. Luetta NuttingCharlene Doneta Bayman RN BSN 08/19/2016 3:38 PM

## 2016-08-19 NOTE — Progress Notes (Signed)
Inpatient Rehabilitation  I continue to await a decision from pt's [employer] re IP Rehab.  I was able to leave a voicemail with the RNCM this am and await a return call.  Please note we anticipate tight bed availability over the next several days.  Please call if questions.  Weldon PickingSusan Emeri Estill PT Inpatient Rehab Admissions Coordinator Cell [phone] Office [phone]

## 2016-08-19 NOTE — Progress Notes (Signed)
Physical Therapy Treatment Patient Details Name: [name] MRN: [id number] DOB: 1970/11/24 Today's Date: 08/19/2016    History of Present Illness 46 yo s/p CABGx 3 with PNA, pt with post op right weakness with CT (-), CTA demonstrates Likely Lt brain as well as posterior circulation infarcts, embolic pattern. PMHx: DM, HTN    PT Comments    Pt making excellent progress. Decreased assist required for all aspect of functional mobility with increased gait distance. Pt is very motivated to participate in therapy. She continues to be an excellent CIR candidate.  Follow Up Recommendations  CIR;Supervision/Assistance - 24 hour     Equipment Recommendations  Rolling walker with 5" wheels    Recommendations for Other Services       Precautions / Restrictions Precautions Precautions: Fall;Sternal Precaution Comments: right hemiparesis and inattention Restrictions Other Position/Activity Restrictions: Sternal precautions    Mobility  Bed Mobility     Rolling: Modified independent (Device/Increase time) Sidelying to sit: Min assist       General bed mobility comments: verbal and tactile cues, assist to elevate trunk and maintain sternal precaution  Transfers   Equipment used: Rolling walker (2 wheeled)   Sit to Stand: Min guard Stand pivot transfers: Min guard       General transfer comment: verbal cues for sequencing and safety. Min guard/tactile cues to control sit without UE assist.  Ambulation/Gait Ambulation/Gait assistance: Min assist Ambulation Distance (Feet): 120 Feet Assistive device: Rolling walker (2 wheeled) Gait Pattern/deviations: Drifts right/left;Step-through pattern;Decreased stride length Gait velocity: decreased Gait velocity interpretation: Below normal speed for age/gender General Gait Details: Assist to manage RW. Verbal cues to attend to R hand grip on RW.   Stairs            Wheelchair Mobility    Modified Rankin (Stroke  Patients Only) Modified Rankin (Stroke Patients Only) Pre-Morbid Rankin Score: No symptoms Modified Rankin: Moderate disability     Balance   Sitting-balance support: Feet supported;No upper extremity supported Sitting balance-Leahy Scale: Good     Standing balance support: Bilateral upper extremity supported;During functional activity Standing balance-Leahy Scale: Fair Standing balance comment: RW needed for ambulation                    Cognition Arousal/Alertness: Awake/alert Behavior During Therapy: Flat affect Overall Cognitive Status: Impaired/Different from baseline Area of Impairment: Attention;Memory;Safety/judgement;Problem solving   Current Attention Level: Selective Memory: Decreased recall of precautions   Safety/Judgement: Decreased awareness of safety;Decreased awareness of deficits   Problem Solving: Slow processing;Requires verbal cues;Difficulty sequencing      Exercises      General Comments        Pertinent Vitals/Pain Pain Assessment: No/denies pain    Home Living                      Prior Function            PT Goals (current goals can now be found in the care plan section) Acute Rehab PT Goals Patient Stated Goal: to be able to take care of myself PT Goal Formulation: With patient Time For Goal Achievement: 08/26/16 Potential to Achieve Goals: Good Progress towards PT goals: Progressing toward goals    Frequency    Min 4X/week      PT Plan Current plan remains appropriate    Co-evaluation             End of Session Equipment Utilized During Treatment: Gait belt Activity Tolerance: Patient  tolerated treatment well Patient left: in chair;with call bell/phone within reach     Time: 0924-0940 PT Time Calculation (min) (ACUTE ONLY): 16 min  Charges:  $Gait Training: 8-22 mins                    G Codes:      [name], [name] 08/19/2016, 9:49 AM

## 2016-08-19 NOTE — Progress Notes (Addendum)
[address]       [place] [address]             [phone]      10 Days Post-Op Procedure(s) (LRB): CORONARY ARTERY BYPASS GRAFTING (CABG) x three,SVG to distal circ OM3, SVG to Om1, LIMA to LAD-using left internal mammary artery and right leg greater saphenous vein harvested endoscopically, Sternal plating - right side (N/A) TRANSESOPHAGEAL ECHOCARDIOGRAM (TEE) (N/A) Subjective: Feels okay this morning. Eating her pancakes without issue. She walked around her room this morning and washed herself up.   Objective: Vital signs in last 24 hours: Temp:  [98 F (36.7 C)-98.4 F (36.9 C)] 98 F (36.7 C) (01/22 0409) Pulse Rate:  [85-93] 85 (01/22 0409) Cardiac Rhythm: Normal sinus rhythm (01/21 1925) Resp:  [18] 18 (01/22 0409) BP: (141-164)/(74-95) 149/95 (01/22 0409) SpO2:  [99 %-100 %] 100 % (01/22 0409) Weight:  [170 lb 14.4 oz (77.5 kg)] 170 lb 14.4 oz (77.5 kg) (01/22 0409)  Intake/Output from previous day: 01/21 0701 - 01/22 0700 In: 790 [P.O.:780; I.V.:10] Out: -  Intake/Output this shift: No intake/output data recorded.  General appearance: alert, cooperative and no distress Heart: regular rate and rhythm, S1, S2 normal, no murmur, click, rub or gallop Lungs: clear to auscultation bilaterally Abdomen: soft, non-tender; bowel sounds normal; no masses,  no organomegaly Extremities: extremities normal, atraumatic, no cyanosis or edema Wound: clean and dry  Lab Results:  Recent Labs  08/17/16 0415 08/18/16 0451  WBC 15.2* 15.7*  HGB 10.5* 10.2*  HCT 32.4* 31.3*  PLT 611* 685*   BMET:  Recent Labs  08/17/16 0415 08/18/16 0451  NA 137 138  K 3.8 4.2  CL 99* 103  CO2 28 28  GLUCOSE 127* 148*  BUN 18 16  CREATININE 0.64 0.71  CALCIUM 8.8* 8.7*    PT/INR: No results for input(s): LABPROT, INR in the last 72 hours. ABG    Component Value Date/Time   PHART 7.379 08/09/2016 1854   HCO3 22.5 08/09/2016 1854   TCO2 22 08/10/2016  1606   ACIDBASEDEF 2.0 08/09/2016 1854   O2SAT 99.0 08/09/2016 1854   CBG (last 3)   Recent Labs  08/18/16 1626 08/18/16 2113 08/19/16 0614  GLUCAP 131* 134* 119*    Assessment/Plan: S/P Procedure(s) (LRB): CORONARY ARTERY BYPASS GRAFTING (CABG) x three,SVG to distal circ OM3, SVG to Om1, LIMA to LAD-using left internal mammary artery and right leg greater saphenous vein harvested endoscopically, Sternal plating - right side (N/A) TRANSESOPHAGEAL ECHOCARDIOGRAM (TEE) (N/A)  1. CV - SR in the 90's-100's. On Lopresor 25 mg bid, increase to Lisinopril 5.0 mg daily, Plavix 75 mg daily, and ecasa 325 mg daily.  2. Pulmonary - On room air. Encourage incentive spirometer. 3. Volume Overload - On Lasix 40 mg daily. 4. Acute blood loss anemia - H and stable at 10.2and 31.3 yesterday 5. ID- on Cefepime for probable pulmonary etiology 6. DM-CBGs [id number]. On Insulin. Pre op HGA1C 13.1. She will need close medical follow up after discharge. 7. Will likely need inpatient rehab when ready for discharge;continue with PT/OT as likely had post op stroke 8. GI-had vomiting over the weekend. No nausea or vomiting recently. Will start clear liquids and slowly advance diet as tolerates. SLP advanced diet last night.   Plan: Appreciate recommendations from PT/OT in regard to inpatient rehab. Okay to discontinue pacing wires today.     LOS: 12 days    Sharlene Dory 08/19/2016  Patient continues to improving , ambulating better Pacing wires out today , would be ready for CIR when bed ready I have seen and examined [name] and agree with the above assessment  and plan.  Delight OvensEdward B Zamire Whitehurst MD [facility] [phone] Office [phone] 08/19/2016 11:15 AM

## 2016-08-20 ENCOUNTER — Inpatient Hospital Stay (HOSPITAL_COMMUNITY): Payer: BLUE CROSS/BLUE SHIELD

## 2016-08-20 ENCOUNTER — Inpatient Hospital Stay (HOSPITAL_COMMUNITY): Payer: BLUE CROSS/BLUE SHIELD | Admitting: Occupational Therapy

## 2016-08-20 ENCOUNTER — Inpatient Hospital Stay (HOSPITAL_COMMUNITY): Payer: BLUE CROSS/BLUE SHIELD | Admitting: Speech Pathology

## 2016-08-20 ENCOUNTER — Inpatient Hospital Stay (HOSPITAL_COMMUNITY): Payer: BLUE CROSS/BLUE SHIELD | Admitting: Physical Therapy

## 2016-08-20 ENCOUNTER — Other Ambulatory Visit: Payer: Self-pay

## 2016-08-20 DIAGNOSIS — I6312 Cerebral infarction due to embolism of basilar artery: Secondary | ICD-10-CM

## 2016-08-20 DIAGNOSIS — R0789 Other chest pain: Secondary | ICD-10-CM

## 2016-08-20 DIAGNOSIS — I25810 Atherosclerosis of coronary artery bypass graft(s) without angina pectoris: Secondary | ICD-10-CM

## 2016-08-20 DIAGNOSIS — I10 Essential (primary) hypertension: Secondary | ICD-10-CM

## 2016-08-20 LAB — CBC WITH DIFFERENTIAL/PLATELET
BASOS PCT: 0 %
Basophils Absolute: 0 10*3/uL (ref 0.0–0.1)
EOS ABS: 0.6 10*3/uL (ref 0.0–0.7)
EOS PCT: 5 %
HCT: 31.1 % — ABNORMAL LOW (ref 36.0–46.0)
Hemoglobin: 10.1 g/dL — ABNORMAL LOW (ref 12.0–15.0)
LYMPHS ABS: 3.2 10*3/uL (ref 0.7–4.0)
Lymphocytes Relative: 25 %
MCH: 29.4 pg (ref 26.0–34.0)
MCHC: 32.5 g/dL (ref 30.0–36.0)
MCV: 90.7 fL (ref 78.0–100.0)
Monocytes Absolute: 0.6 10*3/uL (ref 0.1–1.0)
Monocytes Relative: 5 %
NEUTROS PCT: 65 %
Neutro Abs: 8 10*3/uL — ABNORMAL HIGH (ref 1.7–7.7)
Platelets: 670 10*3/uL — ABNORMAL HIGH (ref 150–400)
RBC: 3.43 MIL/uL — AB (ref 3.87–5.11)
RDW: 14.1 % (ref 11.5–15.5)
WBC: 12.4 10*3/uL — AB (ref 4.0–10.5)

## 2016-08-20 LAB — COMPREHENSIVE METABOLIC PANEL
ALBUMIN: 2.3 g/dL — AB (ref 3.5–5.0)
ALT: 28 U/L (ref 14–54)
ANION GAP: 7 (ref 5–15)
AST: 25 U/L (ref 15–41)
Alkaline Phosphatase: 78 U/L (ref 38–126)
BUN: 14 mg/dL (ref 6–20)
CO2: 28 mmol/L (ref 22–32)
Calcium: 8.9 mg/dL (ref 8.9–10.3)
Chloride: 105 mmol/L (ref 101–111)
Creatinine, Ser: 0.58 mg/dL (ref 0.44–1.00)
GFR calc non Af Amer: >60 mL/min (ref >60)
GLUCOSE: 81 mg/dL (ref 65–99)
Potassium: 3.5 mmol/L (ref 3.5–5.1)
SODIUM: 140 mmol/L (ref 135–145)
Total Bilirubin: 0.5 mg/dL (ref 0.3–1.2)
Total Protein: 6.4 g/dL — ABNORMAL LOW (ref 6.5–8.1)

## 2016-08-20 LAB — GLUCOSE, CAPILLARY
GLUCOSE-CAPILLARY: 182 mg/dL — AB (ref 65–99)
Glucose-Capillary: 84 mg/dL (ref 65–99)
Glucose-Capillary: 149 mg/dL — ABNORMAL HIGH (ref 65–99)
Glucose-Capillary: 158 mg/dL — ABNORMAL HIGH (ref 65–99)
Glucose-Capillary: 177 mg/dL — ABNORMAL HIGH (ref 65–99)

## 2016-08-20 MED ORDER — FUROSEMIDE 40 MG PO TABS
40.0000 mg | ORAL_TABLET | Freq: Every day | ORAL | Status: DC
  Administered 2016-08-21 – 2016-08-23 (×3): 40 mg via ORAL
  Filled 2016-08-20 (×3): qty 1

## 2016-08-20 MED ORDER — LISINOPRIL 5 MG PO TABS
5.0000 mg | ORAL_TABLET | Freq: Every day | ORAL | Status: DC
  Administered 2016-08-21 – 2016-08-23 (×3): 5 mg via ORAL
  Filled 2016-08-20 (×3): qty 1

## 2016-08-20 MED ORDER — METOPROLOL TARTRATE 25 MG PO TABS
25.0000 mg | ORAL_TABLET | Freq: Two times a day (BID) | ORAL | Status: DC
  Administered 2016-08-20 – 2016-08-23 (×6): 25 mg via ORAL
  Filled 2016-08-20 (×7): qty 1

## 2016-08-20 MED ORDER — NITROGLYCERIN 0.4 MG SL SUBL: 0.4000 mg | SUBLINGUAL_TABLET | SUBLINGUAL | Status: DC | PRN

## 2016-08-20 NOTE — Progress Notes (Signed)
Patient reported decrease in pain in chest after receiving ultram 50 mg po at 1840. Patient laying on left side and reported pain has eased and she was better. Instructed patient to notify staff of any chest pain or shortness of breath. Patient verbalized understanding. Continue with plan of care.  Cleotilde NeerJoyce, Nour Rodrigues S

## 2016-08-20 NOTE — Evaluation (Addendum)
Speech Language Pathology Assessment and Plan  Patient Details  Name: [name] MRN: [id number] Date of Birth: 06/27/1971  SLP Diagnosis: Cognitive Impairments  Rehab Potential: Excellent ELOS: 10-15 days    Today's Date: 08/20/2016 SLP Individual Time: 1255-1355 SLP Individual Time Calculation (min): 60 min   Problem List:  Patient Active Problem List   Diagnosis Date Noted  . Stroke due to embolism ([place]) 08/19/2016  . Diabetes mellitus ([place])   . Hypervolemia   . Stroke ([place])   . Coronary artery disease involving coronary bypass graft of native heart without angina pectoris   . Hx of CABG   . Benign essential HTN   . Migraine without status migrainosus, not intractable   . Labile blood glucose   . Diabetes mellitus type 2 in obese ([place])   . Morbid obesity ([place])   . Tachypnea   . Hypoalbuminemia due to protein-calorie malnutrition ([place])   . Leukocytosis   . Acute blood loss anemia   . Pressure injury of skin 08/12/2016  . Coronary artery disease involving native heart without angina pectoris   . 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 ACS (acute coronary syndrome) ([place]) 08/07/2016  . Hypertension 08/07/2016  . Diabetes mellitus with cardiac complication ([place]) 16/55/3748  . Non-STEMI (non-ST elevated myocardial infarction) ([place]) 08/07/2016  . NSTEMI (non-ST elevated myocardial infarction) ([place]) 08/07/2016   Past Medical History:  Past Medical History:  Diagnosis Date  . Headache    due to elevated  BP  . History of kidney stones    "passed them"  . Hypertension   . Pancreatic injury    "damage to pancrease related to gallbladder OR"  . Pneumonia X 1  . Type II diabetes mellitus ([place])    Past Surgical History:  Past Surgical History:  Procedure Laterality Date  . CARDIAC CATHETERIZATION  08/07/2016  . CARDIAC CATHETERIZATION N/A 08/07/2016   Procedure: Left Heart Cath and Coronary Angiography;  Surgeon: Belva Crome, MD;  Location: [facility];  Service:  Cardiovascular;  Laterality: N/A;  . CARDIAC CATHETERIZATION N/A 08/07/2016   Procedure: Intravascular Pressure Wire/FFR Study;  Surgeon: Belva Crome, MD;  Location: [facility];  Service: Cardiovascular;  Laterality: N/A;  . CORONARY ARTERY BYPASS GRAFT N/A 08/09/2016   Procedure: CORONARY ARTERY BYPASS GRAFTING (CABG) x three,SVG to distal circ OM3, SVG to Om1, LIMA to LAD-using left internal mammary artery and right leg greater saphenous vein harvested endoscopically, Sternal plating - right side;  Surgeon: Grace Isaac, MD;  Location: [facility];  Service: Open Heart Surgery;  Laterality: N/A;  . FACIAL RECONSTRUCTION SURGERY  2009   "facial fractures"  . FRACTURE SURGERY    . LAPAROSCOPIC CHOLECYSTECTOMY    . TEE WITHOUT CARDIOVERSION N/A 08/09/2016   Procedure: TRANSESOPHAGEAL ECHOCARDIOGRAM (TEE);  Surgeon: Grace Isaac, MD;  Location: [facility];  Service: Open Heart Surgery;  Laterality: N/A;  . TUBAL LIGATION      Assessment / Plan / Recommendation Clinical Impression Lindsey Cherry-Gaineyis a 46 y.o.femalewith history of HTN, headaches, T2DM--medication non-comliance due to cost who was admitted on 08/07/16 with severe CP and History taken from patient and chart review.She reports family members available for assistance post-discharge. Cardiac cath done revealing severe multivessel disease. She underwent CABG X 3 with sternal plating. Post op with lethargy and weakness. She was found to have right sided weakness with neglect. 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 CTA head/neck done revaling proximal  BA thrombus, BS and L-VA stenosis and remote pontine and cerebellar infarcts. Diabetes coordinator following to help manage uncontrolled BS. Intraoperative TEE without PFO or thrombus. BLE dopplers negative for DVT. Dr. Erlinda Hong felt stroke likely embolic due to surgery or multiple stroke risk factors. To continue ASA/Plavix and consider repeat CTA neck  in 2-3 months to follow up on BA thrombus. Fluid overload treated with IV diuresis with improvement. She did have projectile vomiting on 01/20 with recommendations for dysphagia 1, thin liquids due to concerns of reflux/vomiting.  GI symptoms have improved and diet advanced to regular today. Therapy ongoing and patient limited by right sided weakness, delayed processing and decreased awareness of deficits.   CIR recommended for follow up therapy. Pt admitted to CIR on 08/19/16.  Cognitive-Linguistic Evaluation was completed on 08/20/16 with pt obtaining a score of 28/30 on MOCA version 8.1 with any score above 26 considered to be within the average range. However, pt required extra time to complete all tasks and voiced that tasks were "harder than before" (recent CVA).  Pt would benefit from skilled ST to increase processing speed with higher level tasks to increase independence and discharge caregiver burden before discharge.    Skilled Therapeutic Interventions          Cognitive Linguistic Evaluation completed, see above. Results shared with pt and husband. Pt required extra time for all tasks assessed. She is hoping to return to full-time work, live independently with husband and manage household. Therefore recommend skilled ST to increase independence. Of note, pt recently upgraded to regular diet with thin liquids. Pt finishing lunch when SLP entered room, pt consuming without overt s/s of aspiration.    SLP Assessment  Patient will need skilled Speech Lanaguage Pathology Services during CIR admission    Recommendations  SLP Diet Recommendations: Age appropriate regular solids;Thin Liquid Administration via: Straw;Cup Medication Administration: Whole meds with liquid Supervision: Patient able to self feed Compensations: Slow rate;Small sips/bites Postural Changes and/or Swallow Maneuvers: Seated upright 90 degrees Oral Care Recommendations: Oral care BID Patient destination: Home Follow up  Recommendations: None Equipment Recommended: None recommended by SLP    SLP Frequency 3 to 5 out of 7 days   SLP Duration  SLP Intensity  SLP Treatment/Interventions 10-15 days  Minumum of 1-2 x/day, 30 to 90 minutes  Cognitive remediation/compensation;Patient/family education;Medication managment    Pain Pain Assessment Pain Assessment: No/denies pain  Prior Functioning Cognitive/Linguistic Baseline: Within functional limits Type of Home: House  Lives With: Spouse Available Help at Discharge: Family;Available 24 hours/day Vocation: Full time employment  Function:  Cognition Comprehension Comprehension assist level: Follows complex conversation/direction with extra time/assistive device  Expression   Expression assist level: Expresses complex ideas: With extra time/assistive device  Social Interaction Social Interaction assist level: Interacts appropriately with others with medication or extra time (anti-anxiety, antidepressant).  Problem Solving Problem solving assist level: Solves complex problems: With extra time  Memory Memory assist level: Complete Independence: No helper   Short Term Goals: Week 1: SLP Short Term Goal 1 (Week 1): Pt will demonstrate alternating attention between complex tasks for ~30 minutes with supervision verbal cues.  SLP Short Term Goal 2 (Week 1): Pt will desmonstrate anticipatory awareness and identify 3 tasks that she can participate in at home safely with suppervision cues.  SLP Short Term Goal 3 (Week 1): Pt will recall and implement sternal precautions with Mod I.  SLP Short Term Goal 4 (Week 1): Pt will demonstrate ability to complete complex reasoning tasks  with supervision cues.   Refer to Care Plan for Long Term Goals  Recommendations for other services: None   Discharge Criteria: Patient will be discharged from SLP if patient refuses treatment 3 consecutive times without medical reason, if treatment goals not met, if there is a  change in medical status, if patient makes no progress towards goals or if patient is discharged from hospital.  The above assessment, treatment plan, treatment alternatives and goals were discussed and mutually agreed upon: by patient and by family  Kalina Morabito 08/20/2016, 2:16 PM

## 2016-08-20 NOTE — Progress Notes (Signed)
[address]       [place] [address]             [phone]                     LOS: 1 day   Subjective: Patient became mildly diaphoretic and nauseated while exercising this morning., She did not vomit   Objective: Vital signs in last 24 hours: Patient Vitals for the past 24 hrs:  BP Temp Temp src Pulse Resp SpO2 Height Weight  08/20/16 0918 (!) 147/86 - - 84 - 99 % - -  08/20/16 0848 (!) 160/90 - - 96 - - - -  08/20/16 0558 (!) 153/94 97.8 F (36.6 C) Oral 98 18 97 % - 171 lb 15.3 oz (78 kg)  08/19/16 2238 134/72 - - 93 - - - -  08/19/16 1920 (!) 152/86 97.5 F (36.4 C) Oral 81 18 99 % 4\' 11"  (1.499 m) 174 lb 4.8 oz (79.1 kg)    Filed Weights   08/19/16 1920 08/20/16 0558  Weight: 174 lb 4.8 oz (79.1 kg) 171 lb 15.3 oz (78 kg)    Hemodynamic parameters for last 24 hours:    Intake/Output from previous day: 01/22 0701 - 01/23 0700 In: 240 [P.O.:240] Out: -  Intake/Output this shift: Total I/O In: 20 [I.V.:20] Out: -   Scheduled Meds: . aspirin EC  325 mg Oral Daily  . atorvastatin  80 mg Oral q1800  . clopidogrel  75 mg Oral Daily  . enoxaparin (LOVENOX) injection  40 mg Subcutaneous Q24H  . [START ON 08/21/2016] furosemide  40 mg Oral Daily  . insulin aspart  0-24 Units Subcutaneous TID AC & HS  . insulin aspart  6 Units Subcutaneous TID WC  . insulin detemir  30 Units Subcutaneous Daily  . [START ON 08/21/2016] lisinopril  5 mg Oral Daily  . metoprolol tartrate  25 mg Oral BID  . pantoprazole  40 mg Oral QAC breakfast  . potassium chloride  10 mEq Oral Daily  . senna-docusate  1 tablet Oral QHS  . sodium chloride flush  10-40 mL Intracatheter Q12H   Continuous Infusions: PRN Meds:.acetaminophen, alum & mag hydroxide-simeth, bisacodyl, diphenhydrAMINE, guaiFENesin, guaiFENesin-dextromethorphan, nitroGLYCERIN, ondansetron **OR** ondansetron (ZOFRAN) IV, sodium chloride flush, sodium phosphate, traMADol, traZODone  General  appearance: alert and cooperative Neurologic: right arm is still mildly weaker than left, patient is alert and conversant Heart: regular rate and rhythm, S1, S2 normal, no murmur, click, rub or gallop Lungs: diminished breath sounds bibasilar Abdomen: soft, non-tender; bowel sounds normal; no masses,  no organomegaly Extremities: extremities normal, atraumatic, no cyanosis or edema Wound: Sternum is stable and well-healed, chest tube sutures removed today  Lab Results: CBC: Recent Labs  08/18/16 0451 08/20/16 0421  WBC 15.7* 12.4*  HGB 10.2* 10.1*  HCT 31.3* 31.1*  PLT 685* 670*   BMET:  Recent Labs  08/18/16 0451 08/20/16 0421  NA 138 140  K 4.2 3.5  CL 103 105  CO2 28 28  GLUCOSE 148* 81  BUN 16 14  CREATININE 0.71 0.58  CALCIUM 8.7* 8.9    PT/INR: No results for input(s): LABPROT, INR in the last 72 hours.   Radiology Dg Chest 2 View  Result Date: 08/20/2016 CLINICAL DATA:  Post- operative pneumonia, status post CABG eleven days ago EXAM: CHEST  2 VIEW COMPARISON:  Portable chest x-ray of August 15, 2016 FINDINGS: The lungs are mildly hypoinflated.  There is left basilar atelectasis or pneumonia. The cardiac silhouette is enlarged and there are post CABG changes. There is no significant pulmonary vascular congestion. There small bilateral pleural effusions greatest on the left. The left-sided PICC line tip projects over the distal aspect of the SVC. IMPRESSION: Persistent left basilar atelectasis or pneumonia, slightly improved. Persistent small bilateral pleural effusions. No overt pulmonary edema. Electronically Signed   By: David  SwazilandJordan M.D.   On: 08/20/2016 07:15     Assessment/Plan: Patient now in rehabilitation, wounds are healing well, white count decreased, patient treated more than 7 days for question of left lower lobe pneumonia, currently off antibiotics without fever or chills.   Delight OvensEdward B Angeleen Horney MD 08/20/2016 1:47 PM     Patient ID: [name], female   DOB: 05/19/1971, 46 y.o.   MRN: [id number]

## 2016-08-20 NOTE — Consult Note (Signed)
CARDIOLOGY CONSULT NOTE   Patient ID: [name] MRN: [id number] DOB/AGE: 08/09/1970 46 y.o.  Admit date: 08/19/2016  Requesting Physician: Dr. Wynn Banker Primary Physician:   No PCP Per Patient Primary Cardiologist:   Dr. Antoine Poche Reason for Consultation:  Chest pain  HPI: [name] is a 46 y.o. female with a history of DM, HTN, HLD, tobacco abuse and recently diagnosed CAD with NSTEMI s/p CABG x3V complicated by CVA who was discharged to inpatient rehab on 08/19/16. She had some chest pain today while being transferred to gym and cardiology consulted.   She presented to [facility] on 08/07/16 with chest pain. She ruled in for NSTEMI and underwent heart cath which showed multivessel CAD and was referred for surgery. He underwent CABG x3V (LIMA --> LAD, reverse SVG--> OM1, reverse SVG--> OM3) on 08/09/16. She did have a postoperative event of neurologic origin with symptoms of right-sided weakness and neglect. Neurology consultation was obtained and assisted with management. Initial CT scan of the head without contrast was negative. Subsequent studies including CTA of head and neck showed proximal BA thrombus and left VA stenosis as well as remote pontine and cerebellar infarcts. New stroke felt possibly related to procedure or due to multiple CVA RFs. She was discharged to inpatient rehab on 08/19/16.  Today she had an episode of nausea, diaphoresis and chest discomfort during transfer to the gym. She was being transferred to the gym by wheelchair when she had sudden onset of nausea and diaphoresis. No vomiting. During that time she did have some mild chest discomfort that she now says was not that bad at all. The most prominent symptom was feeling like she was going to vomit. She felt mildly dizzy but no syncope. No large edema, orthopnea or PND. No palpitations. He had not performed any physical activity and has not performed any level of exertion since being transferred to  inpatient rehabilitation.    Past Medical History:  Diagnosis Date  . Headache    due to elevated  BP  . History of kidney stones    "passed them"  . Hypertension   . Pancreatic injury    "damage to pancrease related to gallbladder OR"  . Pneumonia X 1  . Type II diabetes mellitus (HCC)      Past Surgical History:  Procedure Laterality Date  . CARDIAC CATHETERIZATION  08/07/2016  . CARDIAC CATHETERIZATION N/A 08/07/2016   Procedure: Left Heart Cath and Coronary Angiography;  Surgeon: Lyn Records, MD;  Location: [facility] INVASIVE CV LAB;  Service: Cardiovascular;  Laterality: N/A;  . CARDIAC CATHETERIZATION N/A 08/07/2016   Procedure: Intravascular Pressure Wire/FFR Study;  Surgeon: Lyn Records, MD;  Location: [facility] INVASIVE CV LAB;  Service: Cardiovascular;  Laterality: N/A;  . CORONARY ARTERY BYPASS GRAFT N/A 08/09/2016   Procedure: CORONARY ARTERY BYPASS GRAFTING (CABG) x three,SVG to distal circ OM3, SVG to Om1, LIMA to LAD-using left internal mammary artery and right leg greater saphenous vein harvested endoscopically, Sternal plating - right side;  Surgeon: Delight Ovens, MD;  Location: [facility] OR;  Service: Open Heart Surgery;  Laterality: N/A;  . FACIAL RECONSTRUCTION SURGERY  2009   "facial fractures"  . FRACTURE SURGERY    . LAPAROSCOPIC CHOLECYSTECTOMY    . TEE WITHOUT CARDIOVERSION N/A 08/09/2016   Procedure: TRANSESOPHAGEAL ECHOCARDIOGRAM (TEE);  Surgeon: Delight Ovens, MD;  Location: [facility] OR;  Service: Open Heart Surgery;  Laterality: N/A;  . TUBAL LIGATION      Allergies  Allergen Reactions  . Eugenol Hives and Other (See Comments)    Burns mouth  . Hydrochlorothiazide Anaphylaxis  . Morphine And Related Anaphylaxis  . Percocet [Oxycodone-Acetaminophen] Anaphylaxis and Nausea And Vomiting  . Shellfish Allergy Anaphylaxis  . Sulfa Antibiotics Anaphylaxis  . Tomato Anaphylaxis    I have reviewed the patient's current medications . aspirin EC  325 mg Oral Daily  .  atorvastatin  80 mg Oral q1800  . clopidogrel  75 mg Oral Daily  . enoxaparin (LOVENOX) injection  40 mg Subcutaneous Q24H  . [START ON 08/21/2016] furosemide  40 mg Oral Daily  . insulin aspart  0-24 Units Subcutaneous TID AC & HS  . insulin aspart  6 Units Subcutaneous TID WC  . insulin detemir  30 Units Subcutaneous Daily  . [START ON 08/21/2016] lisinopril  5 mg Oral Daily  . metoprolol tartrate  25 mg Oral BID  . pantoprazole  40 mg Oral QAC breakfast  . potassium chloride  10 mEq Oral Daily  . senna-docusate  1 tablet Oral QHS  . sodium chloride flush  10-40 mL Intracatheter Q12H    acetaminophen, alum & mag hydroxide-simeth, bisacodyl, diphenhydrAMINE, guaiFENesin, guaiFENesin-dextromethorphan, nitroGLYCERIN, ondansetron **OR** ondansetron (ZOFRAN) IV, sodium chloride flush, sodium phosphate, traMADol, traZODone  Prior to Admission medications   Medication Sig Start Date End Date Taking? Authorizing Provider  aspirin EC 325 MG EC tablet Take 1 tablet (325 mg total) by mouth daily. 08/20/16  Yes Sharlene Dory, PA-C  atorvastatin (LIPITOR) 80 MG tablet Take 1 tablet (80 mg total) by mouth daily at 6 PM. 08/19/16  Yes Sharlene Dory, PA-C  clopidogrel (PLAVIX) 75 MG tablet Take 1 tablet (75 mg total) by mouth daily. 08/20/16  Yes Sharlene Dory, PA-C  enoxaparin (LOVENOX) 40 MG/0.4ML injection Inject 0.4 mLs (40 mg total) into the skin at bedtime. 08/19/16  Yes Sharlene Dory, PA-C  furosemide (LASIX) 40 MG tablet Take 1 tablet (40 mg total) by mouth daily. 08/20/16 08/27/16 Yes Tessa N Conte, PA-C  insulin aspart (NOVOLOG) 100 UNIT/ML injection Inject 6 Units into the skin 3 (three) times daily with meals. 08/19/16  Yes Sharlene Dory, PA-C  insulin detemir (LEVEMIR) 100 UNIT/ML injection Inject 0.3 mLs (30 Units total) into the skin daily. 08/20/16  Yes Sharlene Dory, PA-C  lisinopril (PRINIVIL,ZESTRIL) 5 MG tablet Take 1 tablet (5 mg total) by mouth daily. 08/20/16  Yes Sharlene Dory, PA-C    metoprolol tartrate (LOPRESSOR) 25 MG tablet Take 1 tablet (25 mg total) by mouth 2 (two) times daily. 08/19/16  Yes Sharlene Dory, PA-C  potassium chloride (K-DUR,KLOR-CON) 10 MEQ tablet Take 1 tablet (10 mEq total) by mouth daily. 08/20/16 08/27/16 Yes Sharlene Dory, PA-C  traMADol (ULTRAM) 50 MG tablet Take 1 tablet (50 mg total) by mouth every 6 (six) hours as needed for moderate pain. 08/19/16  Yes Sharlene Dory, PA-C  triamcinolone (NASACORT ALLERGY 24HR) 55 MCG/ACT AERO nasal inhaler Place 1 spray into both nostrils daily.    Yes Historical Provider, MD     Social History   Social History  . Marital status: Married    Spouse name: N/A  . Number of children: N/A  . Years of education: N/A   Occupational History  . Supervisor [employer]   Social History Main Topics  . Smoking status: Current Every Day Smoker    Packs/day: 0.50    Years: 20.00    Types: Cigarettes  . Smokeless tobacco: Never  Used  . Alcohol use No  . Drug use: No  . Sexual activity: Yes    Birth control/ protection: None   Other Topics Concern  . Not on file   Social History Narrative   Lives with husband and daughter    Family Status  Relation Status  . Mother Alive  . Father Deceased   Family History  Problem Relation Age of Onset  . Hypertension Mother   . Diabetes Mother   . Hypertension Father   . Diabetes Father   . Heart attack Father 72    had CABG    ROS:  Full 14 point review of systems complete and found to be negative unless listed above.  Physical Exam: Blood pressure (!) 147/86, pulse 84, temperature 97.8 F (36.6 C), temperature source Oral, resp. rate 18, height 4\' 11"  (1.499 m), weight 171 lb 15.3 oz (78 kg), SpO2 99 %.  General: Well developed, well nourished, female in no acute distress, chronically ill appearing.  Head: Eyes PERRLA, No xanthomas.   Normocephalic and atraumatic, oropharynx without edema or exudate.  Lungs: CTAB Heart: HRRR S1 S2, no rub/gallop, Heart regular  rate and rhythm with S1, S2 No murmur. pulses are 2+ extrem.   Neck: No carotid bruits. No lymphadenopathy. No JVD. Abdomen: Bowel sounds present, abdomen soft and non-tender without masses or hernias noted. Msk:  No spine or cva tenderness. No weakness, no joint deformities or effusions. Extremities: No clubbing or cyanosis. No LE edema.  Neuro: Alert and oriented X 3. No focal deficits noted. Psych:  Good affect, responds appropriately Skin: No rashes or lesions noted.  Labs:   Lab Results  Component Value Date   WBC 12.4 (H) 08/20/2016   HGB 10.1 (L) 08/20/2016   HCT 31.1 (L) 08/20/2016   MCV 90.7 08/20/2016   PLT 670 (H) 08/20/2016   No results for input(s): INR in the last 72 hours.   Recent Labs Lab 08/20/16 0421  NA 140  K 3.5  CL 105  CO2 28  BUN 14  CREATININE 0.58  CALCIUM 8.9  PROT 6.4*  BILITOT 0.5  ALKPHOS 78  ALT 28  AST 25  GLUCOSE 81  ALBUMIN 2.3*   Magnesium  Date Value Ref Range Status  08/10/2016 2.3 1.7 - 2.4 mg/dL Final   No results for input(s): CKTOTAL, CKMB, TROPONINI in the last 72 hours. No results for input(s): TROPIPOC in the last 72 hours. No results found for: PROBNP Lab Results  Component Value Date   CHOL 120 08/08/2016   HDL 35 (L) 08/08/2016   LDLCALC 67 08/08/2016   TRIG 89 08/08/2016   No results found for: DDIMER Lipase  Date/Time Value Ref Range Status  08/07/2016 02:15 AM 26 11 - 51 U/L Final   No results found for: TSH, T4TOTAL, T3FREE, THYROIDAB No results found for: VITAMINB12, FOLATE, FERRITIN, TIBC, IRON, RETICCTPCT   Echo: 08/08/2016 LV EF: 60% -   65% Study Conclusions - Left ventricle: The cavity size was normal. Wall thickness was   increased in a pattern of mild LVH. Systolic function was normal.   The estimated ejection fraction was in the range of 60% to 65%.   Wall motion was normal; there were no regional wall motion   abnormalities. Features are consistent with a pseudonormal left   ventricular  filling pattern, with concomitant abnormal relaxation   and increased filling pressure (grade 2 diastolic dysfunction). - Left atrium: The atrium was mildly dilated.   ECG:  NSR HR 84, diffuse TWIs similar to previous  Radiology:  Dg Chest 2 View  Result Date: 08/20/2016 CLINICAL DATA:  Post- operative pneumonia, status post CABG eleven days ago EXAM: CHEST  2 VIEW COMPARISON:  Portable chest x-ray of August 15, 2016 FINDINGS: The lungs are mildly hypoinflated. There is left basilar atelectasis or pneumonia. The cardiac silhouette is enlarged and there are post CABG changes. There is no significant pulmonary vascular congestion. There small bilateral pleural effusions greatest on the left. The left-sided PICC line tip projects over the distal aspect of the SVC. IMPRESSION: Persistent left basilar atelectasis or pneumonia, slightly improved. Persistent small bilateral pleural effusions. No overt pulmonary edema. Electronically Signed   By: David  Swazil[place]Jordan M.D.   On: 08/20/2016 07:15    Cath  08/07/16  The left ventricular systolic function is normal.  The left ventricular ejection fraction is 50-55% by visual estimate.  1st Mrg lesion, 75 %stenosed.  Ost 2nd Mrg to 2nd Mrg lesion, 50 %stenosed.  Ost 3rd Mrg to 3rd Mrg lesion, 90 %stenosed.  3rd Mrg lesion, 75 %stenosed.  Lat 3rd Mrg lesion, 90 %stenosed.  Ost LPDA to LPDA lesion, 50 %stenosed.  Prox LAD to Mid LAD lesion, 80 %stenosed. Ost 1st Diag to 1st Diag lesion, 85 %stenosed.   ASSESSMENT AND PLAN:    Active Problems:   Coronary artery disease involving coronary bypass graft of native heart without angina pectoris   Benign essential HTN   Morbid obesity (HCC)   Stroke due to embolism (HCC)   Diabetes mellitus (HCC)  Chest pain: Now resolved. ECG with no acute ST or TW changes. This episode was most consistent with vasovagal. She is currently feeling better. Would not pursue further workup at this time.  CAD s/p CABG  x3V (08/09/16): continue ASA 325mg  daily, atorvastatin 80mg  daily, and lopressor 25mg  BID  HTN: moderate control on current regimen   HLD: LDL at goal (67). Continue statin   DMT2: poorly controlled. HgA1c 13.3  Cline CrockKathryn Thompson, [place] JerseyPA-C 08/20/2016 12:26 PM  Pager [phone]  Co-Sign MD Patient seen and examined and history reviewed. Agree with above findings and plan. Patient known to our service. S/p recent CABG complicated by CVA. Now on Rehab. Today developed acute nausea and diaphoresis. Minimal chest pain. Resolved with rest. She has not required pain meds for post op pain. No diarrhea. No sternal tenderness. Ecg is without acute change.  I think it is very unlikely that her symptoms are ischemic in origin. Recent revascularization. I suspect she just had nausea with some vagal mediated diaphoresis. No further cardiac evaluation needed. Continue Rehab therapies. Continue risk factor modification. Call with questions.  Ryan Ogborn [place], MDFACC 08/20/2016 2:59 PM

## 2016-08-20 NOTE — Progress Notes (Signed)
Called to gym by PT. Patient complaining of feeling "sweating under her nose and forehead" and then feeling "cold". Patient noted to be anxious and crying. Emotional support provided. 0935 B/P 140/82 P 110-121 O2 sat at 99% and resp. 40. Patient reported nausea but no chest pain. Assisted patient back to room and patient able to stand pivot with therapy into bed. 0943 B/P 126/79 HR 117 O2 sat at 98% room air. P. Love,PA notified and orders received. Patient reported feeling less anxious and no chest pain or shortness of breath at this time. Continue to monitor patient.  Cleotilde NeerJoyce, Shreyan Hinz S

## 2016-08-20 NOTE — Care Management Note (Signed)
Inpatient Rehabilitation Center Individual Statement of Services  Patient Name:  [name]  Date:  08/20/2016  [facility]  Our goal is to provide you with an individualized program based on your diagnosis and situation, designed to meet your specific needs.  With this comprehensive rehabilitation program, you will be expected to participate in at least 3 hours of rehabilitation therapies Monday-Friday, with modified therapy programming on the weekends.  Your rehabilitation program will include the following services:  Physical Therapy (PT), Occupational Therapy (OT), Speech Therapy (ST), 24 hour per day rehabilitation nursing, Therapeutic Recreaction (TR), Neuropsychology, Case Management (Social Worker), Rehabilitation Medicine, Nutrition Services and Pharmacy Services  Weekly team conferences will be held on Wednesday to discuss your progress.  Your Social Worker will talk with you frequently to get your input and to update you on team discussions.  Team conferences with you and your family in attendance may also be held.  Expected length of stay: 10-14 days Overall anticipated outcome: mod/i level  Depending on your progress and recovery, your program may change. Your Social Worker will coordinate services and will keep you informed of any changes. Your Social Worker's name and contact numbers are listed  below.  The following services may also be recommended but are not provided by the [facility]:   Driving Evaluations  Home Health Rehabiltiation Services  Outpatient Rehabilitation Services  Vocational Rehabilitation   Arrangements will be made to provide these services after discharge if needed.  Arrangements include referral to agencies that provide these services.  Your insurance has been verified to be:  [employer] Your primary doctor is:  Leilani AbleBetti Reese  Pertinent information will be shared with your doctor and your  insurance company.  Social Worker:  Dossie DerBecky Renette Hsu, SW [phone] or [phone]) [phone]  Information discussed with and copy given to patient by: Lucy Chrisupree, Maisie Hauser G, 08/20/2016, 10:03 AM

## 2016-08-20 NOTE — Progress Notes (Signed)
Patient with episode of nausea, diaphoresis and chest discomfort during transfer to gym. BP noted to be elevated with HR 10\20's.  Brought back to the room--EKG NSR. Symptoms better. Cardiology consulted to evaluate for input.

## 2016-08-20 NOTE — Progress Notes (Signed)
Ankit Karis Juba, MD Physician Signed Physical Medicine and Rehabilitation  Consult Note Date of Service: 08/13/2016 8:36 AM  Related encounter: ED to Hosp-Admission (Discharged) from 08/07/2016 in [facility] 2W CARDIAC UNIT     Expand All Collapse All   [] Hide copied text [] Hover for attribution information      Physical Medicine and Rehabilitation Consult   Reason for Consult: Right sided weakness and right neglect Referring Physician: Dr. Tyrone Sage   HPI: Pristine Gladhill is a 46 y.o. female with history of HTN, migraines, T2DM--poorly controlled; who was admitted on 08/07/16 with severe CP and History taken from patient, but mainly chart review.  She reports family members available for assistance post-discharge. cardiac cath done revealing severe multivessel disease. She underwent CABG X 3 with sternal plating. Post op with lethargy and weakness. She was found to have right sided weakness with neglect.  CT head done yesterday and was negative for acute changes. Neurology consulted yesterday and recommended full work up for probable new stroke. Diabetes coordinator following to help manage uncontrolled BS. PT evaluation done yesterday revealing patient to have cognitive deficits, lethargy and right sided weakness affecting mobility. CIR recommended for follow up therapy.   Intraoperative TEE without PFO. BLE dopplers pending. CTA head/neck done today, results pending.  Discussed with Dr. Roda Shutters who feels that stroke likely embolic due to surgery--ASA and Plavix for stroke prevention and 30 day monitor after discharge.   Review of Systems  Constitutional: Positive for malaise/fatigue. Negative for chills and fever.  Cardiovascular: Positive for chest pain.  Gastrointestinal: Positive for constipation.  Musculoskeletal: Positive for myalgias.  Neurological: Positive for sensory change, speech change, focal weakness and weakness.  All other systems reviewed  and are negative.      Past Medical History:  Diagnosis Date  . History of kidney stones    "passed them"  . Hypertension   . Migraine    "daily" (08/07/2016)  . Pancreatic injury    "damage to pancrease related to gallbladder OR"  . Pneumonia X 1  . Type II diabetes mellitus (HCC)          Past Surgical History:  Procedure Laterality Date  . CARDIAC CATHETERIZATION  08/07/2016  . CARDIAC CATHETERIZATION N/A 08/07/2016   Procedure: Left Heart Cath and Coronary Angiography;  Surgeon: Lyn Records, MD;  Location: [facility] INVASIVE CV LAB;  Service: Cardiovascular;  Laterality: N/A;  . CARDIAC CATHETERIZATION N/A 08/07/2016   Procedure: Intravascular Pressure Wire/FFR Study;  Surgeon: Lyn Records, MD;  Location: [facility] INVASIVE CV LAB;  Service: Cardiovascular;  Laterality: N/A;  . CORONARY ARTERY BYPASS GRAFT N/A 08/09/2016   Procedure: CORONARY ARTERY BYPASS GRAFTING (CABG) x three,SVG to distal circ OM3, SVG to Om1, LIMA to LAD-using left internal mammary artery and right leg greater saphenous vein harvested endoscopically, Sternal plating - right side;  Surgeon: Delight Ovens, MD;  Location: [facility] OR;  Service: Open Heart Surgery;  Laterality: N/A;  . FACIAL RECONSTRUCTION SURGERY  2009   "facial fractures"  . FRACTURE SURGERY    . LAPAROSCOPIC CHOLECYSTECTOMY    . TEE WITHOUT CARDIOVERSION N/A 08/09/2016   Procedure: TRANSESOPHAGEAL ECHOCARDIOGRAM (TEE);  Surgeon: Delight Ovens, MD;  Location: [facility] OR;  Service: Open Heart Surgery;  Laterality: N/A;  . TUBAL LIGATION            Family History  Problem Relation Age of Onset  . Hypertension Mother   . Diabetes Mother   . Hypertension Father   .  Diabetes Father   . Heart attack Father 71    had CABG    Social History:  reports that she has been smoking Cigarettes.  She has a 10.00 pack-year smoking history. She has never used smokeless tobacco. She reports that she does not drink alcohol or use  drugs.         Allergies  Allergen Reactions  . Eugenol Hives and Other (See Comments)    Burns mouth  . Hydrochlorothiazide Anaphylaxis  . Morphine And Related Anaphylaxis  . Percocet [Oxycodone-Acetaminophen] Anaphylaxis and Nausea And Vomiting  . Shellfish Allergy Anaphylaxis  . Sulfa Antibiotics Anaphylaxis  . Tomato Anaphylaxis          Medications Prior to Admission  Medication Sig Dispense Refill  . insulin lispro (HUMALOG) 100 UNIT/ML injection Inject 5-20 Units into the skin 3 (three) times daily before meals. Takes 5-20 units as directed per sliding scale    . triamcinolone (NASACORT ALLERGY 24HR) 55 MCG/ACT AERO nasal inhaler Place 1 spray into both nostrils daily.     . insulin NPH-regular Human (NOVOLIN 70/30) (70-30) 100 UNIT/ML injection Inject 25-75 Units into the skin 2 (two) times daily. 75 units in the morning and 25 units in the evening      Home: Home Living Family/patient expects to be discharged to:: Private residence Living Arrangements: Spouse/significant other Available Help at Discharge: Family, Available 24 hours/day Type of Home: House Home Layout: One level Home Equipment: None Additional Comments: pt lives with spouse who works but mom can assist at [employer]  Functional History: Prior Function Level of Independence: Independent Functional Status:  Mobility: Bed Mobility Overal bed mobility: Needs Assistance Bed Mobility: Rolling, Sidelying to Sit, Sit to Sidelying Rolling: Max assist Sidelying to sit: Max assist Sit to sidelying: Max assist General bed mobility comments: max cues and assist to bend knee and roll bil, assist to elevate trunk from surface as well as bring RLE off of bed. With return to bed assist to bring RLE onto bed and control trunk to surface Transfers Overall transfer level: Needs assistance Transfers: Sit to/from Stand, Stand Pivot Transfers Sit to Stand: Mod assist Stand pivot transfers: Max  assist General transfer comment: mod assist with bil knees blocked to stand and control descent to bed and bSC. Max assist with cues to take pivotal steps to step bed <> BSC  ADL:  Cognition: Cognition Overall Cognitive Status: Impaired/Different from baseline Orientation Level: Oriented to person, Oriented to place, Oriented to situation Cognition Arousal/Alertness: Lethargic Behavior During Therapy: Flat affect Overall Cognitive Status: Impaired/Different from baseline Area of Impairment: Orientation, Attention, Memory, Following commands, Problem solving Orientation Level: Disoriented to, Time Current Attention Level: Sustained Memory: Decreased short-term memory, Decreased recall of precautions Following Commands: Follows one step commands inconsistently, Follows one step commands with increased time Problem Solving: Slow processing, Decreased initiation, Difficulty sequencing, Requires verbal cues, Requires tactile cues General Comments: initially pt not oriented to day, month, year and lethargic. after education pt able to recall time and 2/5 sternal precautions   Blood pressure (!) 143/89, pulse 98, temperature 98.3 F (36.8 C), temperature source Oral, resp. rate (!) 30, height 4\' 11"  (1.499 m), weight 87.7 kg (193 lb 5.5 oz), last menstrual period 07/19/2016, SpO2 97 %. Physical Exam  Vitals reviewed. Constitutional: She appears well-developed.  Obese  HENT:  Head: Normocephalic and atraumatic.  Eyes: EOM are normal. Scleral icterus is present.  Neck: Normal range of motion. Neck supple.  Cardiovascular: Normal rate and regular rhythm.  Respiratory: No respiratory distress. She has no wheezes.  Poor inspiratory effort +Tachypnea  GI: Soft. Bowel sounds are normal.  Musculoskeletal: She exhibits no edema or tenderness.  Neurological:  Lethargic, but arousable Left facial weakness Motor: RUE: 0/5 proximal to distal RLE: 1/5 proximal to distal LUE: 4-/5 proximal  to distal LLE: 3+/5 proximal to distal DTRs symmetric Sensation diminished to light touch throughout, L>R  Skin: Skin is warm and dry.  Psychiatric: Her affect is blunt. Her speech is delayed. She is slowed.    Lab Results Last 24 Hours       Results for orders placed or performed during the hospital encounter of 08/07/16 (from the past 24 hour(s))  Glucose, capillary     Status: Abnormal   Collection Time: 08/12/16 12:55 PM  Result Value Ref Range   Glucose-Capillary 109 (H) 65 - 99 mg/dL   Comment 1 Capillary Specimen    Comment 2 Notify RN   Glucose, capillary     Status: Abnormal   Collection Time: 08/12/16  4:12 PM  Result Value Ref Range   Glucose-Capillary 114 (H) 65 - 99 mg/dL   Comment 1 Capillary Specimen    Comment 2 Notify RN   Glucose, capillary     Status: Abnormal   Collection Time: 08/12/16  8:21 PM  Result Value Ref Range   Glucose-Capillary 166 (H) 65 - 99 mg/dL   Comment 1 Notify RN   Glucose, capillary     Status: Abnormal   Collection Time: 08/12/16 11:54 PM  Result Value Ref Range   Glucose-Capillary 133 (H) 65 - 99 mg/dL   Comment 1 Capillary Specimen   Glucose, capillary     Status: Abnormal   Collection Time: 08/13/16  4:31 AM  Result Value Ref Range   Glucose-Capillary 62 (L) 65 - 99 mg/dL   Comment 1 Venous Specimen   Basic metabolic panel     Status: Abnormal   Collection Time: 08/13/16  4:40 AM  Result Value Ref Range   Sodium 137 135 - 145 mmol/L   Potassium 3.6 3.5 - 5.1 mmol/L   Chloride 101 101 - 111 mmol/L   CO2 29 22 - 32 mmol/L   Glucose, Bld 67 65 - 99 mg/dL   BUN 17 6 - 20 mg/dL   Creatinine, Ser 1.61 0.44 - 1.00 mg/dL   Calcium 8.0 (L) 8.9 - 10.3 mg/dL   GFR calc non Af Amer >60 >60 mL/min   GFR calc Af Amer >60 >60 mL/min   Anion gap 7 5 - 15  CBC     Status: Abnormal   Collection Time: 08/13/16  4:40 AM  Result Value Ref Range   WBC 14.7 (H) 4.0 - 10.5 K/uL   RBC 3.00 (L) 3.87 -  5.11 MIL/uL   Hemoglobin 9.0 (L) 12.0 - 15.0 g/dL   HCT 09.6 (L) 04.5 - 40.9 %   MCV 92.0 78.0 - 100.0 fL   MCH 30.0 26.0 - 34.0 pg   MCHC 32.6 30.0 - 36.0 g/dL   RDW 81.1 91.4 - 78.2 %   Platelets 318 150 - 400 K/uL  Hepatic function panel     Status: Abnormal   Collection Time: 08/13/16  4:40 AM  Result Value Ref Range   Total Protein 5.8 (L) 6.5 - 8.1 g/dL   Albumin 2.1 (L) 3.5 - 5.0 g/dL   AST 52 (H) 15 - 41 U/L   ALT 53 14 - 54 U/L   Alkaline Phosphatase 71  38 - 126 U/L   Total Bilirubin 0.8 0.3 - 1.2 mg/dL   Bilirubin, Direct 0.2 0.1 - 0.5 mg/dL   Indirect Bilirubin 0.6 0.3 - 0.9 mg/dL  Glucose, capillary     Status: Abnormal   Collection Time: 08/13/16  6:03 AM  Result Value Ref Range   Glucose-Capillary 106 (H) 65 - 99 mg/dL   Comment 1 Capillary Specimen   Glucose, capillary     Status: Abnormal   Collection Time: 08/13/16  7:59 AM  Result Value Ref Range   Glucose-Capillary 149 (H) 65 - 99 mg/dL   Comment 1 Capillary Specimen    Comment 2 Notify RN       Imaging Results (Last 48 hours)  Ct Head Wo Contrast  Result Date: 08/12/2016 CLINICAL DATA:  CABG last week.  Right facial droop. EXAM: CT HEAD WITHOUT CONTRAST TECHNIQUE: Contiguous axial images were obtained from the base of the skull through the vertex without intravenous contrast. COMPARISON:  MRI 05/28/2014, CT 05/28/2014 FINDINGS: Brain: Ventricle size normal.  Cerebral volume normal. Patchy hypodensities in the cerebral white matter bilaterally similar to prior studies and most consistent with chronic microvascular ischemia. Negative for intracranial hemorrhage. Negative for acute infarct or mass. Vascular: No hyperdense vessel or unexpected calcification. Skull: Negative Sinuses/Orbits: Negative Other: None IMPRESSION: No acute intracranial abnormality. Chronic white matter changes consistent with microvascular ischemia. Electronically Signed   By: Marlan Palau M.D.   On: 08/12/2016  09:51   Dg Chest Port 1 View  Result Date: 08/12/2016 CLINICAL DATA:  Sore chest post CABG. EXAM: PORTABLE CHEST 1 VIEW COMPARISON:  08/11/2016 FINDINGS: Sternotomy wires and right IJ central venous sheath unchanged. Lungs are hypoinflated demonstrate mild prominence of the perihilar markings with slight interval improvement likely minimal vascular congestion. Stable mild left base opacification likely small effusion with atelectasis. Mild stable cardiomegaly. Remainder of the exam is unchanged. IMPRESSION: Hypoinflation with stable left base opacification likely small effusion with atelectasis. Mild stable cardiomegaly with possible mild vascular congestion improved. Right IJ central venous sheath unchanged. Electronically Signed   By: Elberta Fortis M.D.   On: 08/12/2016 08:07     Assessment/Plan: Diagnosis: Suspected CVA Labs and images independently reviewed.  Records reviewed and summated above. Stroke: Continue secondary stroke prophylaxis and Risk Factor Modification listed below:   Antiplatelet therapy:  Blood Pressure Management:  Continue current medication with prn's with permisive HTN per primary team Statin Agent: Diabetes management:  Tobacco abuse:   R>L sided hemiparesis: fit for orthosis to prevent contractures (resting hand splint for day, wrist cock up splint at night, PRAFO, etc Motor recovery: Fluoxetine  1. Does the need for close, 24 hr/day medical supervision in concert with the patient's rehab needs make it unreasonable for this patient to be served in a less intensive setting? Yes  2. Co-Morbidities requiring supervision/potential complications: HTN (monitor and provide prns in accordance with increased physical exertion and pain), migraines (ensure pain does not limit therapies), T2DM with lability (Monitor in accordance with exercise and adjust meds as necessary), CAD s/p CABG X 3 (Monitor in accordance with increased physical activity and avoid UE resistance  excercises), tachypnea (monitor RR and O2 Sats with increased physical exertion), hypoalbuminemia (maximize nutrition for overall health and wound healing), leukocytosis (cont to monitor for signs and symptoms of infection, further workup if indicated), ABLA (transfuse if necessary to ensure appropriate perfusion for increased activity tolerance), morbid obesity (Body mass index is 39.05 kg/m., diet and exercise education encourage weight loss to  increase endurance and promote overall health) 3. Due to bladder management, safety, disease management, medication administration, pain management and patient education, does the patient require 24 hr/day rehab nursing? Yes 4. Does the patient require coordinated care of a physician, rehab nurse, PT (1-2 hrs/day, 5 days/week), OT (1-2 hrs/day, 5 days/week) and SLP (1-2 hrs/day, 5 days/week) to address physical and functional deficits in the context of the above medical diagnosis(es)? Yes Addressing deficits in the following areas: balance, endurance, locomotion, strength, transferring, bowel/bladder control, bathing, dressing, toileting, cognition, speech and psychosocial support 5. Can the patient actively participate in an intensive therapy program of at least 3 hrs of therapy per day at least 5 days per week? Potentially 6. The potential for patient to make measurable gains while on inpatient rehab is excellent 7. Anticipated functional outcomes upon discharge from inpatient rehab are min assist  with PT, min assist with OT, supervision and min assist with SLP. 8. Estimated rehab length of stay to reach the above functional goals is: 20-25 days. 9. Does the patient have adequate social supports and living environment to accommodate these discharge functional goals? Potentially 10. Anticipated D/C setting: Home 11. Anticipated post D/C treatments: HH therapy and Home excercise program 12. Overall Rehab/Functional Prognosis: good  RECOMMENDATIONS: This  patient's condition is appropriate for continued rehabilitative care in the following setting: CIR if adequate caregiver support available on discharge after completion of medical workup and able to tolerate 3 hours therapy/day. Patient has agreed to participate in recommended program. Potentially Note that insurance prior authorization may be required for reimbursement for recommended care.  Comment: Rehab Admissions Coordinator to follow up.   Jerene PitchLove, Pamela S, PA-C 08/13/2016  Maryla MorrowAnkit Patel, MD, FAAPMR    Revision History                        Routing History

## 2016-08-20 NOTE — Evaluation (Signed)
Occupational Therapy Assessment and Plan  Patient Details  Name: [name] MRN: [id number] Date of Birth: 1970/09/03  OT Diagnosis: disturbance of vision, hemiplegia affecting dominant side and muscle weakness (generalized) Rehab Potential: Rehab Potential (ACUTE ONLY): Good ELOS: 10-12 days   Today's Date: 08/20/2016 OT Individual Time: 1112-1205 OT Individual Time Calculation (min): 53 min     Problem List:  Patient Active Problem List   Diagnosis Date Noted  . Stroke due to embolism ([place]) 08/19/2016  . Diabetes mellitus ([place])   . Hypervolemia   . Stroke ([place])   . Coronary artery disease involving coronary bypass graft of native heart without angina pectoris   . Hx of CABG   . Benign essential HTN   . Migraine without status migrainosus, not intractable   . Labile blood glucose   . Diabetes mellitus type 2 in obese ([place])   . Morbid obesity ([place])   . Tachypnea   . Hypoalbuminemia due to protein-calorie malnutrition ([place])   . Leukocytosis   . Acute blood loss anemia   . Pressure injury of skin 08/12/2016  . Coronary artery disease involving native heart without angina pectoris   . 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 ACS (acute coronary syndrome) ([place]) 08/07/2016  . Hypertension 08/07/2016  . Diabetes mellitus with cardiac complication ([place]) 24/40/1027  . Non-STEMI (non-ST elevated myocardial infarction) ([place]) 08/07/2016  . NSTEMI (non-ST elevated myocardial infarction) ([place]) 08/07/2016    Past Medical History:  Past Medical History:  Diagnosis Date  . Headache    due to elevated  BP  . History of kidney stones    "passed them"  . Hypertension   . Pancreatic injury    "damage to pancrease related to gallbladder OR"  . Pneumonia X 1  . Type II diabetes mellitus ([place])    Past Surgical History:  Past Surgical History:  Procedure Laterality Date  . CARDIAC CATHETERIZATION  08/07/2016  . CARDIAC CATHETERIZATION N/A 08/07/2016   Procedure: Left Heart Cath and  Coronary Angiography;  Surgeon: Belva Crome, MD;  Location: [facility];  Service: Cardiovascular;  Laterality: N/A;  . CARDIAC CATHETERIZATION N/A 08/07/2016   Procedure: Intravascular Pressure Wire/FFR Study;  Surgeon: Belva Crome, MD;  Location: [facility];  Service: Cardiovascular;  Laterality: N/A;  . CORONARY ARTERY BYPASS GRAFT N/A 08/09/2016   Procedure: CORONARY ARTERY BYPASS GRAFTING (CABG) x three,SVG to distal circ OM3, SVG to Om1, LIMA to LAD-using left internal mammary artery and right leg greater saphenous vein harvested endoscopically, Sternal plating - right side;  Surgeon: Grace Isaac, MD;  Location: [facility];  Service: Open Heart Surgery;  Laterality: N/A;  . FACIAL RECONSTRUCTION SURGERY  2009   "facial fractures"  . FRACTURE SURGERY    . LAPAROSCOPIC CHOLECYSTECTOMY    . TEE WITHOUT CARDIOVERSION N/A 08/09/2016   Procedure: TRANSESOPHAGEAL ECHOCARDIOGRAM (TEE);  Surgeon: Grace Isaac, MD;  Location: [facility];  Service: Open Heart Surgery;  Laterality: N/A;  . TUBAL LIGATION      Assessment & Plan Clinical Impression: Patient is a 46 y.o. year old female with history of HTN, headaches, T2DM--medication non-comliance due to cost who was admitted on 08/07/16 with severe CP and History taken from patient and chart review.She reports family members available for assistance post-discharge. Cardiac cath done revealing severe multivessel disease. She underwent CABG X 3 with sternal plating. Post op with lethargy and weakness. She was found to have right sided weakness with neglect. CT head  done 1/17 showing acute on chronic multiple small vessel infarcts. CTA head/neck done revaling proximal BA thrombus, BS and L-VA stenosis and remote pontine and cerebellar infarcts. Diabetes coordinator following to help manage uncontrolled BS. Intraoperative TEE without PFO or thrombus. BLE dopplers negative for DVT. Dr. Erlinda Hong felt stroke likely embolic due to surgery or multiple  stroke risk factors. To continue ASA/Plavix and consider repeat CTA neck in 2-3 months to follow up on BA thrombus.   Fluid overload treated with IV diuresis with improvement. She did have projectile vomiting on 01/20 with recommendations for dysphagia 1, thin liquids due to concerns of reflux/vomiting.  GI symptoms have improved and diet advanced to regular today. Therapy ongoing and patient limited by right sided weakness, delayed processing and decreased awareness of deficits.   CIR recommended for follow up therapy.    Patient transferred to CIR on 08/19/2016 .    Patient currently requires min with basic self-care skills secondary to muscle weakness, decreased cardiorespiratoy endurance, unbalanced muscle activation and decreased coordination, and decreased standing balance, decreased postural control and hemiplegia.  Prior to hospitalization, patient could complete ADLs with independent .  Patient will benefit from skilled intervention to increase independence with basic self-care skills prior to discharge home with care partner.  Anticipate patient will require intermittent supervision and follow up outpatient.  OT - End of Session Activity Tolerance: Tolerates 30+ min activity with multiple rests Endurance Deficit: Yes Endurance Deficit Description: (P) Elevated HR and respiration rate with PT, monitored stats throughout eval OT Assessment Rehab Potential (ACUTE ONLY): Good OT Patient demonstrates impairments in the following area(s): Balance;Endurance;Motor;Safety;Skin Integrity;Vision OT Basic ADL's Functional Problem(s): Grooming;Bathing;Dressing;Toileting OT Advanced ADL's Functional Problem(s): Simple Meal Preparation;Laundry OT Transfers Functional Problem(s): Toilet;Tub/Shower OT Additional Impairment(s): Fuctional Use of Upper Extremity OT Plan OT Intensity: Minimum of 1-2 x/day, 45 to 90 minutes OT Frequency: 5 out of 7 days OT Duration/Estimated Length of Stay: 10-12 days OT  Treatment/Interventions: Medical illustrator training;Community reintegration;Discharge planning;Disease mangement/prevention;DME/adaptive equipment instruction;Functional mobility training;Neuromuscular re-education;Pain management;Patient/family education;Psychosocial support;Self Care/advanced ADL retraining;Skin care/wound managment;Therapeutic Activities;Therapeutic Exercise;UE/LE Strength taining/ROM;UE/LE Coordination activities;Visual/perceptual remediation/compensation OT Self Feeding Anticipated Outcome(s): Mod I OT Basic Self-Care Anticipated Outcome(s): Mod I OT Toileting Anticipated Outcome(s): Mod I OT Bathroom Transfers Anticipated Outcome(s): Mod I OT Recommendation Recommendations for Other Services: Therapeutic Recreation consult Therapeutic Recreation Interventions: Kitchen group;Outing/community reintergration Patient destination: Home Follow Up Recommendations: Outpatient OT Equipment Recommended: 3 in 1 bedside comode;Tub/shower seat;To be determined   Skilled Therapeutic Intervention OT eval completed with discussion of rehab process, OT purpose, POC, ELOS, and goals.  ADL assessment completed at EOB due to PA verbal orders to due bed side therapy.  Pt with no diaphoresis or nausea during evaluation.  Completed modified bathing and dressing from sit > stand level at EOB.  Pt required min-mod assist for sit > stand secondary to sternal precautions.  Min guard throughout bathing and dressing due to decreased balance reactions.  Pt attempted to don underwear in standing requiring assist and cues for increased safety. Completed 9 hole peg test Rt: 40 seconds and Lt: 30 seconds with overshooting on Rt.  Left seated EOB with cardiologist present.  OT Evaluation Precautions/Restrictions  Precautions Precautions: Fall;Sternal Restrictions Weight Bearing Restrictions: No General   Vital Signs Therapy Vitals Pulse Rate: 84 BP: (!) 147/86 Patient Position (if appropriate):  Sitting Oxygen Therapy SpO2: 99 % O2 Device: Not Delivered Pain Pain Assessment Pain Assessment: No/denies pain Pain Score: 0-No pain Patients Stated Pain Goal: 2 Home Living/Prior Functioning Home  Living Family/patient expects to be discharged to:: Private residence Living Arrangements: Spouse/significant other Available Help at Discharge: Family, Available 24 hours/day Type of Home: House Home Access: Stairs to enter [employer] of Steps: 2 (Patient does have a back entrance without steps to enter) Entrance Stairs-Rails: None Home Layout: One level Bathroom Shower/Tub: Gaffer, Chiropodist: Standard Bathroom Accessibility: Yes Additional Comments: pt lives with spouse who works but mom can assist at [employer] With: Spouse Prior Function Level of Independence: Independent with homemaking with ambulation  Able to Take Stairs?: Yes Driving: Yes Vocation: Full time employment Vision/Perception     Cognition Overall Cognitive Status: Within Functional Limits for tasks assessed Arousal/Alertness: Awake/alert Orientation Level: Person;Place;Situation Person: Oriented Place: Oriented Situation: Oriented Year: 2018 Month: January Day of Week: Incorrect (Monday) Memory: Appears intact Immediate Memory Recall: Blue;Bed;Sock Memory Recall: Blue;Bed;Sock Memory Recall Sock: With Cue Memory Recall Blue: Without Cue Memory Recall Bed: Without Cue Awareness: Appears intact Problem Solving: Appears intact Safety/Judgment: Appears intact Sensation Sensation Light Touch: Appears Intact Proprioception: Appears Intact Coordination Finger Nose Finger Test: Mild dysmetria on Rt with some overshooting 9 Hole Peg Test: Rt: 40 seconds (noted some overshooting), Lt: 30 seconds  Motor  Motor Motor: Within Functional Limits Mobility  Transfers Sit to Stand: 4: Min assist Sit to Stand Details: Tactile cues for sequencing;Tactile cues for weight  shifting;Verbal cues for sequencing;Verbal cues for technique;Verbal cues for precautions/safety  Trunk/Postural Assessment  Cervical Assessment Cervical Assessment: Within Functional Limits Thoracic Assessment Thoracic Assessment: Within Functional Limits Lumbar Assessment Lumbar Assessment: Within Functional Limits Postural Control Postural Control: Within Functional Limits  Balance Balance Balance Assessed: Yes Static Standing Balance Static Standing - Balance Support: Bilateral upper extremity supported Static Standing - Level of Assistance: 4: Min assist Dynamic Standing Balance Dynamic Standing - Balance Support: Bilateral upper extremity supported Dynamic Standing - Level of Assistance: 4: Min assist Extremity/Trunk Assessment RUE Assessment RUE Assessment: Exceptions to [facility] (Shoulder flexion grossly 100 degrees, strength 2+/5,  elbow WFL strength grossly 4/5, loose gross grasp) LUE Assessment LUE Assessment: Within Functional Limits (strength grossly 4+/5)   See Function Navigator for Current Functional Status.   Refer to Care Plan for Long Term Goals  Recommendations for other services: Therapeutic Recreation  Kitchen group and Outing/community reintegration   Discharge Criteria: Patient will be discharged from OT if patient refuses treatment 3 consecutive times without medical reason, if treatment goals not met, if there is a change in medical status, if patient makes no progress towards goals or if patient is discharged from hospital.  The above assessment, treatment plan, treatment alternatives and goals were discussed and mutually agreed upon: by patient and by family  Ellwood Dense [facility] 08/20/2016, 12:32 PM

## 2016-08-20 NOTE — Evaluation (Signed)
Physical Therapy Assessment and Plan  Patient Details  Name: [name] MRN: [id number] Date of Birth: [birth year]  PT Diagnosis: Abnormality of gait and Difficulty walking and decreased cardiovascular endurance  Rehab Potential:   ELOS: 10-14 days   Today's Date: 08/20/2016 PT Individual Time: 7867-6720 PT Individual Time Calculation (min): 30 min  Patient missed 45 minutes secondary to Patient with decreased BP, elvevated HR, RR, diaphoretic, nausea and chest pain deferred at this time. RN Angie present to assess patient PA Pam notified STAT EKG further therapy deferred.  Problem List:  Patient Active Problem List   Diagnosis Date Noted  . Stroke due to embolism ([place]) 08/19/2016  . Diabetes mellitus ([place])   . Hypervolemia   . Stroke ([place])   . Coronary artery disease involving coronary bypass graft of native heart without angina pectoris   . Hx of CABG   . Benign essential HTN   . Migraine without status migrainosus, not intractable   . Labile blood glucose   . Diabetes mellitus type 2 in obese ([place])   . Morbid obesity ([place])   . Tachypnea   . Hypoalbuminemia due to protein-calorie malnutrition ([place])   . Leukocytosis   . Acute blood loss anemia   . Pressure injury of skin 08/12/2016  . Coronary artery disease involving native heart without angina pectoris   . 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 ACS (acute coronary syndrome) ([place]) 08/07/2016  . Hypertension 08/07/2016  . Diabetes mellitus with cardiac complication ([place]) 94/70/9628  . Non-STEMI (non-ST elevated myocardial infarction) ([place]) 08/07/2016  . NSTEMI (non-ST elevated myocardial infarction) ([place]) 08/07/2016    Past Medical History:  Past Medical History:  Diagnosis Date  . Headache    due to elevated  BP  . History of kidney stones    "passed them"  . Hypertension   . Pancreatic injury    "damage to pancrease related to gallbladder OR"  . Pneumonia X 1  . Type II diabetes mellitus ([place])    Past Surgical  History:  Past Surgical History:  Procedure Laterality Date  . CARDIAC CATHETERIZATION  08/07/2016  . CARDIAC CATHETERIZATION N/A 08/07/2016   Procedure: Left Heart Cath and Coronary Angiography;  Surgeon: Belva Crome, MD;  Location: [facility];  Service: Cardiovascular;  Laterality: N/A;  . CARDIAC CATHETERIZATION N/A 08/07/2016   Procedure: Intravascular Pressure Wire/FFR Study;  Surgeon: Belva Crome, MD;  Location: [facility];  Service: Cardiovascular;  Laterality: N/A;  . CORONARY ARTERY BYPASS GRAFT N/A 08/09/2016   Procedure: CORONARY ARTERY BYPASS GRAFTING (CABG) x three,SVG to distal circ OM3, SVG to Om1, LIMA to LAD-using left internal mammary artery and right leg greater saphenous vein harvested endoscopically, Sternal plating - right side;  Surgeon: Grace Isaac, MD;  Location: [facility];  Service: Open Heart Surgery;  Laterality: N/A;  . FACIAL RECONSTRUCTION SURGERY  2009   "facial fractures"  . FRACTURE SURGERY    . LAPAROSCOPIC CHOLECYSTECTOMY    . TEE WITHOUT CARDIOVERSION N/A 08/09/2016   Procedure: TRANSESOPHAGEAL ECHOCARDIOGRAM (TEE);  Surgeon: Grace Isaac, MD;  Location: [facility];  Service: Open Heart Surgery;  Laterality: N/A;  . TUBAL LIGATION      Assessment & Plan Clinical Impression: .Jurnie Garritano is a 46 y.o. female with history of HTN, migraines, T2DM--poorly controlled; who was admitted on 08/07/16 with severe CP and History taken from patient, but mainly chart review.  She reports family members available for assistance post-discharge. cardiac cath done  revealing severe multivessel disease. She underwent CABG X 3 with sternal plating. Post op with lethargy and weakness. She was found to have right sided weakness with neglect.  CT head done yesterday and was negative for acute changes. Neurology consulted yesterday and recommended full work up for probable new stroke. Diabetes coordinator following to help manage uncontrolled BS. PT  evaluation done yesterday revealing patient to have cognitive deficits, lethargy and right sided weakness affecting mobility.  Patient transferred to CIR on 08/19/2016 .   Patient currently requires min with mobility secondary to muscle weakness and decreased cardiorespiratoy endurance.  Prior to hospitalization, patient was independent  with mobility and lived with Spouse in a House home.  Home access is 2 (Patient does have a back entrance without steps to enter)Stairs to enter.  Patient will benefit from skilled PT intervention to maximize safe functional mobility, minimize fall risk and decrease caregiver burden for planned discharge home with intermittent assist.  Anticipate patient will benefit from follow up [facility] at discharge.  PT - End of Session Activity Tolerance: Tolerates < 10 min activity with changes in vital signs Endurance Deficit: Yes PT Assessment PT Patient demonstrates impairments in the following area(s): Balance;Pain;Endurance PT Transfers Functional Problem(s): Bed to Chair;Car;Furniture;Bed Mobility PT Locomotion Functional Problem(s): Ambulation PT Plan PT Intensity: Minimum of 1-2 x/day ,45 to 90 minutes PT Frequency: 5 out of 7 days PT Duration Estimated Length of Stay: 10-14 days PT Treatment/Interventions: Ambulation/gait training;Balance/vestibular training;Discharge planning;Community reintegration;Disease management/prevention;DME/adaptive equipment instruction;Functional mobility training;Neuromuscular re-education;Patient/family education;Stair training;Therapeutic Activities;Therapeutic Exercise PT Transfers Anticipated Outcome(s): Mod  PT Locomotion Anticipated Outcome(s): Mod I home environment PT Recommendation Follow Up Recommendations: Home health PT;Outpatient PT Patient destination: Home Equipment Recommended: Rolling walker with 5" wheels;To be determined  Skilled Therapeutic Intervention  After manual muscle testing of bilateral lower extremities  patient with decreased BP, elvevated HR, RR, diaphoretic, nausea and chest pain deferred at this time. Patient transported back to room in wheelchair and returned to bed with all needs met and RN Angie present for EKG.   PT Evaluation Precautions/Restrictions Precautions Precautions: Fall;Sternal Restrictions Weight Bearing Restrictions: No General   Vital SignsTherapy Vitals Pulse Rate: 84 BP: (!) 147/86 Patient Position (if appropriate): Sitting Oxygen Therapy SpO2: 99 % O2 Device: Not Delivered Pain Pain Assessment Pain Assessment: No/denies pain Pain Score: 0-No pain Patients Stated Pain Goal: 2 Home Living/Prior Functioning Home Living Available Help at Discharge: Family;Available 24 hours/day Type of Home: House Home Access: Stairs to enter [employer] of Steps: 2 (Patient does have a back entrance without steps to enter) Entrance Stairs-Rails: None Home Layout: One level Bathroom Shower/Tub: Walk-in shower;Tub/shower unit Armed forces training and education officer: Yes Additional Comments: pt lives with spouse who works but mom can assist at [employer] With: Spouse Prior Function Level of Independence: Independent with homemaking with ambulation  Able to Take Stairs?: Yes Driving: Yes Vocation: Full time employment  Cognition Overall Cognitive Status: Within Functional Limits for tasks assessed Arousal/Alertness: Awake/alert Orientation Level: Oriented X4 Memory: Appears intact Awareness: Appears intact Problem Solving: Appears intact Safety/Judgment: Appears intact Sensation Sensation Light Touch: Appears Intact Proprioception: Appears Intact Motor  Motor Motor: Within Functional Limits  Mobility Transfers Transfers: Yes Sit to Stand: 4: Min assist Sit to Stand Details: Tactile cues for sequencing;Tactile cues for weight shifting;Verbal cues for sequencing;Verbal cues for technique;Verbal cues for precautions/safety Stand Pivot  Transfers: 4: Min assist Stand Pivot Transfer Details: Tactile cues for sequencing;Tactile cues for weight shifting;Visual cues/gestures for precautions/safety;Visual cues/gestures for  sequencing Locomotion  Ambulation Ambulation: No (Patient with decreased BP, elvevated HR, RR, diaphoretic, nausea and chest pain deferred at this time.) Gait Gait: No (Patient with decreased BP, elvevated HR, RR, diaphoretic, nausea and chest pain deferred at this time.) Stairs / Additional Locomotion Stairs: No (Patient with decreased BP, elvevated HR, RR, diaphoretic, nausea and chest pain deferred at this time.) Ramp:  (Patient with decreased BP, elvevated HR, RR, diaphoretic, nausea and chest pain deferred at this time.) Curb:  (Patient with decreased BP, elvevated HR, RR, diaphoretic, nausea and chest pain deferred at this time.) Wheelchair Mobility Wheelchair Mobility: Yes Wheelchair Assistance: 5: Supervision Wheelchair Assistance Details: Verbal cues for sequencing (BLE propulsion secondary to sternal precautions) Wheelchair Propulsion: Both lower extermities Wheelchair Parts Management: Supervision/cueing Distance: 35 feet (limited by fatigue)  Trunk/Postural Assessment  Cervical Assessment Cervical Assessment: Within Functional Limits Thoracic Assessment Thoracic Assessment: Within Functional Limits Lumbar Assessment Lumbar Assessment: Within Functional Limits Postural Control Postural Control: Within Functional Limits  Balance Balance Balance Assessed: Yes Static Standing Balance Static Standing - Balance Support: Bilateral upper extremity supported Static Standing - Level of Assistance: 4: Min assist Dynamic Standing Balance Dynamic Standing - Balance Support: Bilateral upper extremity supported Dynamic Standing - Level of Assistance: 4: Min assist Extremity Assessment      RLE gross 4/5 LLE grossly 4/5  See Function Navigator for Current Functional Status.   Refer to Care Plan  for Long Term Goals  Recommendations for other services: None   Discharge Criteria: Patient will be discharged from PT if patient refuses treatment 3 consecutive times without medical reason, if treatment goals not met, if there is a change in medical status, if patient makes no progress towards goals or if patient is discharged from hospital.  The above assessment, treatment plan, treatment alternatives and goals were discussed and mutually agreed upon: by patient  Retta Diones 08/20/2016, 10:08 AM

## 2016-08-20 NOTE — Progress Notes (Signed)
Patient reporting pain in chest and radiating under bilateral arms. Reporting sharp, shooting pains and "feels like someone sitting on my chest at times". B/P 141/82 pulse 82 - 95. O2 sat 98% on room air . D . Ang[place]illi, PA notified. Continue with plan of care.  Cleotilde NeerJoyce, Remberto Lienhard S

## 2016-08-20 NOTE — Progress Notes (Signed)
[name] Rehab Admission Coordinator Signed Physical Medicine and Rehabilitation  PMR Pre-admission Vega of Service: 08/15/2016 10:04 AM  Related encounter: ED to Hosp-Admission (Discharged) from 08/07/2016 in [place]       '[]' Hide copied text PMR Admission Coordinator Pre-Admission Assessment  Patient: [name] is an 46 y.o., female MRN: [id number] DOB: 1971/02/12 Height: '4\' 11"'  (149.9 cm) Weight: 77.5 kg (170 lb 14.4 oz)                                                                                                                                                  Insurance Information HMO:     PPO:  X     PCP:      IPA:      80/20:      OTHER:  PRIMARY: Physicist, medical Preferred with [employer], [place]      Policy#: [id number]      Subscriber:  self CM Name:  [name].       Phone#:  [phone]     Fax#:  [phone] Pre-Cert#:  Approved for 7 days, 08/19/16- with last covered day 08/25/16; clincial updates due 08/26/16 to [name], phone [phone]; fax [phone]      Employer: [employer] Benefits:  Phone #:  [phone]     Name:  [name]. Vega:  [birth year] with plan Vega of 10-28-15 to 10-26-16     Deduct:  $2700 (applies to OOP maximum)      Out of Pocket Max:  $2700      Life Max:  n/a CIR:  100% after deductible met      SNF:  100% after deductible met Outpatient:  100% after deductible met      Co-Pay:   Home Health:  100% after deductible met      Co-Pay:  DME:  100% after deductible met     Co-Pay:   Providers:  In network SECONDARY:       Policy#:       Subscriber:  CM Name:       Phone#:      Fax#:  Pre-Cert#:       Employer:  Benefits:  Phone #:      Name:  [name]:      Deduct:       Out of Pocket Max:       Life Max:  CIR:       SNF:  Outpatient:      Co-Pay:  Home Health:       Co-Pay:  DME:      Co-Pay:   Medicaid Application Vega:       Case Manager:  Disability  Application Vega:       Case Worker:   Emergency Contact Information  Contact Information    Name Relation Home Work Mobile   [name] Spouse   [phone]   [name] Mother   [phone]     Current Medical History  Patient Admitting Diagnosis: Suspected CVA History of Present Illness: Lindsey Cherry-Gaineyis a 46 y.o.femalewith history of HTN, migraines, T2DM--poorly controlled; who was admitted on 08/07/16 with severe CP and History taken from patient and chart review.She reports family members available for assistance post-discharge. Cardiac cath done revealing severe multivessel disease. She underwent CABG X 3 with sternal plating. Post op with lethargy and weakness. She was found to have right sided weakness with neglect. 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 CTA head/neck done revaling proximal BA thrombus, BS and L-VA stenosis and remote pontine and cerebellar infarcts. Diabetes coordinator following to help manage uncontrolled BS. Intraoperative TEE without PFO or thrombus. BLEdopplers negative for DVT. Dr. Erlinda Vega felt stroke likely embolic due to surgery or multiple stroke risk factors. To continue Lindsey/Plavix and consider repeat CTA neck in 2-3 months to follow up on BA thrombus.   Fluid overload treated with IV diuresis with improvement. She did have projectile vomiting on 01/20 with recommendations for dysphagia 1, thin liquids due to concerns of reflux/vomiting. GIsymptoms have improved and diet advanced to regular today. Therapy ongoing and patient limited by right sided weakness, delayed processing and decreased awareness of deficits. CIR recommended for follow up therapy.  Past Medical History      Past Medical History:  Diagnosis Vega  . History of kidney stones    "passed them"  . Hypertension   . Migraine    "daily" (08/07/2016)  . Pancreatic injury    "damage to pancrease related to  gallbladder OR"  . Pneumonia X 1  . Type II diabetes mellitus (HCC)     Family History  family history includes Diabetes in her father and mother; Heart attack (age of onset: 67) in her father; Hypertension in her father and mother.  Prior Rehab/Hospitalizations:  Has the patient had major surgery during 100 days prior to admission? No  Current Medications   Current Facility-Administered Medications:  .  0.9 %  sodium chloride infusion, 250 mL, Intravenous, PRN, Lindsey Isaac, MD .  aspirin EC tablet 325 mg, 325 mg, Oral, Daily, Lindsey Isaac, MD, 325 mg at 08/19/16 1123 .  atorvastatin (LIPITOR) tablet 80 mg, 80 mg, Oral, q1800, Lindsey Hawking, MD, 80 mg at 08/18/16 1816 .  bisacodyl (DULCOLAX) EC tablet 10 mg, 10 mg, Oral, Daily PRN **OR** bisacodyl (DULCOLAX) suppository 10 mg, 10 mg, Rectal, Daily PRN, Lindsey Isaac, MD .  clopidogrel (PLAVIX) tablet 75 mg, 75 mg, Oral, Daily, Lindsey Hawking, MD, 75 mg at 08/19/16 1124 .  docusate sodium (COLACE) capsule 200 mg, 200 mg, Oral, Daily, Lindsey Isaac, MD, 200 mg at 08/19/16 1123 .  enoxaparin (LOVENOX) injection 40 mg, 40 mg, Subcutaneous, QHS, Lindsey Pollack, MD, 40 mg at 08/18/16 2227 .  furosemide (LASIX) tablet 40 mg, 40 mg, Oral, Daily, Lindsey Isaac, MD, 40 mg at 08/19/16 1123 .  guaiFENesin (MUCINEX) 12 hr tablet 600 mg, 600 mg, Oral, Q12H PRN, Lindsey Isaac, MD .  CBG monitoring, , , 4x Daily, AC & HS **AND** insulin aspart (novoLOG) injection 0-24 Units, 0-24 Units, Subcutaneous, TID AC & HS, Lindsey Isaac, MD, 12 Units at 08/19/16 1127 .  insulin aspart (novoLOG) injection 6 Units, 6 Units, Subcutaneous, TID WC, Lindsey Pollack, MD, 6 Units  at 08/19/16 1128 .  insulin detemir (LEVEMIR) injection 30 Units, 30 Units, Subcutaneous, Daily, Lindsey Liston Alba, PA-C, 30 Units at 08/19/16 1124 .  lisinopril (PRINIVIL,ZESTRIL) tablet 5 mg, 5 mg, Oral, Daily, Lindsey N Conte, PA-C, 5 mg at 08/19/16 1123 .   metoprolol tartrate (LOPRESSOR) tablet 25 mg, 25 mg, Oral, BID, Lindsey M Zimmerman, PA-C, 25 mg at 08/19/16 1123 .  ondansetron (ZOFRAN) tablet 4 mg, 4 mg, Oral, Q6H PRN **OR** ondansetron (ZOFRAN) injection 4 mg, 4 mg, Intravenous, Q6H PRN, Lindsey Isaac, MD, 4 mg at 08/17/16 1011 .  pantoprazole (PROTONIX) EC tablet 40 mg, 40 mg, Oral, QAC breakfast, Lindsey Isaac, MD, 40 mg at 08/19/16 1122 .  potassium chloride (K-DUR,KLOR-CON) CR tablet 10 mEq, 10 mEq, Oral, Daily, Lindsey Isaac, MD, 10 mEq at 08/19/16 1123 .  sodium chloride flush (NS) 0.9 % injection 10-40 mL, 10-40 mL, Intracatheter, PRN, Lindsey Isaac, MD, 10 mL at 08/19/16 0447 .  sodium chloride flush (NS) 0.9 % injection 3 mL, 3 mL, Intravenous, Q12H, Lindsey Isaac, MD .  sodium chloride flush (NS) 0.9 % injection 3 mL, 3 mL, Intravenous, PRN, Lindsey Isaac, MD .  traMADol Veatrice Bourbon) tablet 50 mg, 50 mg, Oral, Q6H PRN, Lindsey Isaac, MD, 50 mg at 08/19/16 1456  Patients Current Diet: Diet heart healthy/carb modified Room service appropriate? 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    Home Assistive Devices / Equipment Home Assistive Devices/Equipment: Eyeglasses, Dentures (specify type) Home Equipment: None  Prior Device Use: Indicate devices/aids used by the patient prior to current illness, exacerbation or injury? None   Prior Functional Level Prior Function Level of Independence:  Independent  Self Care: Did the patient need help bathing, dressing, using the toilet or eating?  Independent  Indoor Mobility: Did the patient need assistance with walking from room to room (with or without device)? Independent  Stairs: Did the patient need assistance with internal or external stairs (with or without device)? Independent  Functional Cognition: Did the patient need help planning regular tasks such as shopping or remembering to take medications? Independent  Current Functional Level Cognition  Overall Cognitive Status: Impaired/Different from baseline Current Attention Level: Selective Orientation Level: Oriented X4 Following Commands: Follows one step commands with increased time Safety/Judgement: Decreased awareness of safety, Decreased awareness of deficits General Comments: pt able to recall 2/5 precautions    Extremity Assessment (includes Sensation/Coordination)  Upper Extremity Assessment: RUE deficits/detail RUE Deficits / Details: Pt with isolated RUE movement adn ableto use RUE as gross assist wtih mobiliyt.Ableto actively lift RUE off bed to @ 30 degrees. Poor functional grasp.unable to oppose digits wtih thumb. Unable to feed self with R hand. Wilhelmenia Blase stage V arm and  IV hand RUE Sensation: decreased light touch ("feels numb") LUE Deficits / Details: grossly WFL at least 3/5  Lower Extremity Assessment: RLE deficits/detail, LLE deficits/detail RLE Deficits / Details: 2/5 knee extension, 2/5 hip ABdct/ADD, 1/5 knee flexion LLE Deficits / Details: grossly WFL at least 3/5    ADLs  Overall  ADL's : Needs assistance/impaired Eating/Feeding: Minimal assistance Grooming: Moderate assistance Upper Body Bathing: Moderate assistance Lower Body Bathing: Maximal assistance Upper Body Dressing : Maximal assistance Lower Body Dressing: Maximal assistance Toilet Transfer: +2 for physical assistance, Minimal assistance, Stand-pivot, BSC Toileting-  Clothing Manipulation and Hygiene: Maximal assistance, Sit to/from stand Toileting - Clothing Manipulation Details (indicate cue type and reason): Pt able to assist with pulling pants over Lt hip  Functional mobility during ADLs: Minimal assistance, +2 for physical assistance General ADL Comments: Pt able to recall 2/5 sternal precautions.       Mobility  Overal bed mobility: Needs Assistance Bed Mobility: Rolling, Sidelying to Sit, Sit to Sidelying Rolling: Modified independent (Device/Increase time) Sidelying to sit: Min assist Sit to supine: Mod assist Sit to sidelying: Mod assist General bed mobility comments: verbal and tactile cues, assist to elevate trunk and maintain sternal precaution    Transfers  Overall transfer level: Needs assistance Equipment used: Rolling walker (2 wheeled) Transfers: Sit to/from Stand Sit to Stand: Min guard Stand pivot transfers: Min guard General transfer comment: verbal cues for sequencing and safety. Min guard/tactile cues to control sit without UE assist.    Ambulation / Gait / Stairs / Wheelchair Mobility  Ambulation/Gait Ambulation/Gait assistance: Museum/gallery curator (Feet): 120 Feet Assistive device: Rolling walker (2 wheeled) Gait Pattern/deviations: Drifts right/left, Step-through pattern, Decreased stride length General Gait Details: Assist to manage RW. Verbal cues to attend to R hand grip on RW. Gait velocity: decreased Gait velocity interpretation: Below normal speed for age/gender    Posture / Balance Dynamic Sitting Balance Sitting balance - Comments: Pt sat EOB x 20 mins with min guard assist.  Pt very guarded  Balance Overall balance assessment: Needs assistance Sitting-balance support: Feet supported, No upper extremity supported Sitting balance-Leahy Scale: Good Sitting balance - Comments: Pt sat EOB x 20 mins with min guard assist.  Pt very guarded  Postural control: Left lateral lean Standing balance  support: Bilateral upper extremity supported, During functional activity Standing balance-Leahy Scale: Fair Standing balance comment: RW needed for ambulation    Special needs/care consideration BiPAP/CPAP    no CPM   no Continuous Drip IV   no Dialysis  no       Life Vest    no Oxygen     no Special Bed   no Trach Size   n/a Wound Vac (area)   no      Skin   Surgical incision sternum, approximated edges, skin glue                          Bowel mgmt:Last BM 08/18/16, continent  Bladder mgmt: continent Diabetic mgmt:   poorly controled diabetic in home environment; A1c 13.1     Previous Home Environment Living Arrangements: Spouse/significant other Available Help at Discharge: Family, Available 24 hours/day Type of Home: House Home Layout: One level Home Access: Stairs to enter [employer] of Steps: 2 (at front entrance) Bathroom Shower/Tub: Tub/shower unit, Multimedia programmer: Standard Bathroom Accessibility: Yes How Accessible: Accessible via walker Home Care Services: No Additional Comments: pt lives with spouse who works but mom can assist at [employer]  Discharge Living Setting Plans for Discharge Living Setting: Patient's home Type of Home at Discharge: House Discharge Home Layout: One level Discharge Home Access: Stairs to enter [place] of Steps: 2 (at front entrance) Discharge Bathroom Shower/Tub: Tub/shower unit, Walk-in shower Discharge Bathroom Toilet: Standard Discharge [place] Accessibility:  Yes How Accessible: Accessible via walker Does the patient have any problems obtaining your medications?: No  Social/Family/Support Systems Patient Roles: Spouse, Parent (3 children, one adult child still in the home) Anticipated Caregiver: Benjiman Core (spouse), Tacy Learn (68 year old daughter) and [name] (pt.s mother) Anticipated Caregiver's Contact Information: Benjiman Core [phone]; [name]  [phone] Ability/Limitations of Caregiver: Husband and daughter work , however husband states he will take as much time off as needed .  Additionally, pt's mother, Lelan Pons, is retired and is avaialble as much as needed. Lelan Pons lives in [place] and is a retired Forensic scientist of 35 years.  Lelan Pons is present at the bedside and providing appropriate care of her daughter. Caregiver Availability: 24/7 Discharge Plan Discussed with Primary Caregiver: Yes Is Caregiver In Agreement with Plan?: Yes Does Caregiver/Family have Issues with Lodging/Transportation while Pt is in Rehab?: No   Goals/Additional Needs Patient/Family Goal for Rehab: min assist PT/OT; supervision and min assist SLP Expected length of stay: 20-25 days Cultural Considerations: none Dietary Needs: carb modified diet with thin liquids Equipment Needs: TBA Pt/Family Agrees to Admission and willing to participate: Yes Program Orientation Provided & Reviewed with Pt/Caregiver Including Roles  & Responsibilities: Yes   Decrease burden of Care through IP rehab admission: n/a   Possible need for SNF placement upon discharge: not anticipated   Patient Condition: This patient's medical and functional status has changed since the consult dated 08/13/16 in which the Rehabilitation Physician determined and documented that the patient was potentially appropriate for intensive rehabilitative care in an inpatient rehabilitation facility. Issues have been addressed and update has been discussed with Dr. Posey Pronto and patient now appropriate for inpatient rehabilitation. Pt. Will have  24 hour care in the home environment and pt. Has demonstrated ability to tolerate IP Rehab level therapies.  Will admit to inpatient rehab today.   Preadmission Screen Completed By:  [name], 08/19/2016 3:41 PM ______________________________________________________________________   Discussed status with Dr. Posey Pronto on 08/19/16 at  1540  and  received telephone approval for admission today.  Admission Coordinator:  [name], time [id number] /Vega 08/19/16       Cosigned by: Ankit Lorie Phenix, MD at 08/19/2016 4:03 PM  Revision History

## 2016-08-20 NOTE — Progress Notes (Signed)
Subjective/Complaints: No issues overnite, some "numbness in RUE" ROS- neg CP, SOB, N/V/D Objective: Vital Signs: Blood pressure (!) 160/90, pulse 96, temperature 97.8 F (36.6 C), temperature source Oral, resp. rate 18, height _0  (1.499 m), weight 78 kg (171 lb 15.3 oz), SpO2 97 %. Dg Chest 2 View  Result Date: 08/20/2016 CLINICAL DATA:  Post- operative pneumonia, status post CABG eleven days ago EXAM: CHEST  2 VIEW COMPARISON:  Portable chest x-ray of August 15, 2016 FINDINGS: The lungs are mildly hypoinflated. There is left basilar atelectasis or pneumonia. The cardiac silhouette is enlarged and there are post CABG changes. There is no significant pulmonary vascular congestion. There small bilateral pleural effusions greatest on the left. The left-sided PICC line tip projects over the distal aspect of the SVC. IMPRESSION: Persistent left basilar atelectasis or pneumonia, slightly improved. Persistent small bilateral pleural effusions. No overt pulmonary edema. Electronically Signed   By: David  Martinique M.D.   On: 08/20/2016 07:15   Results for orders placed or performed during the hospital encounter of 08/19/16 (from the past 72 hour(s))  Glucose, capillary     Status: Abnormal   Collection Time: 08/19/16  8:35 PM  Result Value Ref Range   Glucose-Capillary 132 (H) 65 - 99 mg/dL  CBC WITH DIFFERENTIAL     Status: Abnormal   Collection Time: 08/20/16  4:21 AM  Result Value Ref Range   WBC 12.4 (H) 4.0 - 10.5 K/uL   RBC 3.43 (L) 3.87 - 5.11 MIL/uL   Hemoglobin 10.1 (L) 12.0 - 15.0 g/dL   HCT 31.1 (L) 36.0 - 46.0 %   MCV 90.7 78.0 - 100.0 fL   MCH 29.4 26.0 - 34.0 pg   MCHC 32.5 30.0 - 36.0 g/dL   RDW 14.1 11.5 - 15.5 %   Platelets 670 (H) 150 - 400 K/uL   Neutrophils Relative % 65 %   Neutro Abs 8.0 (H) 1.7 - 7.7 K/uL   Lymphocytes Relative 25 %   Lymphs Abs 3.2 0.7 - 4.0 K/uL   Monocytes Relative 5 %   Monocytes Absolute 0.6 0.1 - 1.0 K/uL   Eosinophils Relative 5 %    Eosinophils Absolute 0.6 0.0 - 0.7 K/uL   Basophils Relative 0 %   Basophils Absolute 0.0 0.0 - 0.1 K/uL  Comprehensive metabolic panel     Status: Abnormal   Collection Time: 08/20/16  4:21 AM  Result Value Ref Range   Sodium 140 135 - 145 mmol/L   Potassium 3.5 3.5 - 5.1 mmol/L   Chloride 105 101 - 111 mmol/L   CO2 28 22 - 32 mmol/L   Glucose, Bld 81 65 - 99 mg/dL   BUN 14 6 - 20 mg/dL   Creatinine, Ser 0.58 0.44 - 1.00 mg/dL   Calcium 8.9 8.9 - 10.3 mg/dL   Total Protein 6.4 (L) 6.5 - 8.1 g/dL   Albumin 2.3 (L) 3.5 - 5.0 g/dL   AST 25 15 - 41 U/L   ALT 28 14 - 54 U/L   Alkaline Phosphatase 78 38 - 126 U/L   Total Bilirubin 0.5 0.3 - 1.2 mg/dL   GFR calc non Af Amer >60 >60 mL/min   GFR calc Af Amer >60 >60 mL/min    Comment: (NOTE) The eGFR has been calculated using the CKD EPI equation. This calculation has not been validated in all clinical situations. eGFR's persistently <60 mL/min signify possible Chronic Kidney Disease.    Anion gap 7 5 - 15  Glucose, capillary     Status: None   Collection Time: 08/20/16  6:28 AM  Result Value Ref Range   Glucose-Capillary 84 65 - 99 mg/dL     HEENT: normal Cardio: RRR and No rubs, murmurs or ES Resp: CTA B/L and Unlabored GI: BS positive and NT, ND Extremity:  Pulses positive and No Edema Skin:   Wound CDI midline sternotomy, drain sites with sutures Neuro: Musc/Skel:  Other no pain with ROM in UE or LE Gen NAD   Assessment/Plan: 1. 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 Physiatrist is providing close team supervision and 24 hour management of active medical problems listed below. Physiatrist and rehab team continue to assess barriers to discharge/monitor patient progress toward functional and medical goals. FIM:       Function - Toileting Toileting steps completed by patient: Adjust clothing  prior to toileting, Performs perineal hygiene, Adjust clothing after toileting Toileting Assistive Devices: Grab bar or rail Assist level: Supervision or verbal cues  Function - Air cabin crew transfer assistive device:  (wheelchair)  Function - Chair/bed transfer Chair/bed transfer method: Stand pivot Chair/bed transfer assist level: No Help, no cues, assistive device, takes more than a reasonable amount of time     Function - Comprehension Comprehension: Auditory Comprehension assist level: Follows complex conversation/direction with no assist  Function - Expression Expression: Verbal Expression assist level: Expresses complex ideas: With no assist  Function - Social Interaction Social Interaction assist level: Interacts appropriately with others - No medications needed.  Function - Problem Solving Problem solving assist level: Solves complex problems: Recognizes & self-corrects  Function - Memory Memory assist level: Complete Independence: No helper Patient normally able to recall (first 3 days only): Staff names and faces, That he or she is in a hospital  Medical Problem List and Plan: 1.  Weakness, poor activity tolerance, abnormality of gait secondary to multiple small vessel infarcts. CIR PT, OT, SLP evals 2.  DVT Prophylaxis/Anticoagulation: Pharmaceutical: Lovenox 3. Pain Management: continue tramadol prn for pain.  4. Mood: LCSW to follow for evaluation and support.  5. Neuropsych: This patient is capable of making decisions on her own behalf. 6. Skin/Wound Care: Routine pressure relief measures 7. Fluids/Electrolytes/Nutrition: Monitor I/O. Check lytes in am. 8. CAD s/p CABG: On ASA, plavix, lipitor and BB 9. HTN:  Monitor BP bid. On lasix, metoprolol and Prinivil--dose increased today for tighter control.  10.T2DM: Monitor BS ac/hs and use SSI for elevated BS. Reports that she can not afford lantus due to cost and was using NPH at home.  CBG (last 3)    Recent Labs  08/19/16 1604 08/19/16 2035 08/20/16 0628  GLUCAP 101* 132* 84    11. Leucocytosis: Monitor for signs of infection. Has completed 7 day course of antibiotic therapy today. WBC 12.4 down from 15K 12. Volume overload: Check weights daily. Continue lasix and monitor for signs of overload.  13. ABLA: recheck Hgb 10.1 stable 14. Morbid obesity: Encourage weight loss   LOS (Days) 1 A FACE TO FACE EVALUATION WAS PERFORMED  Sahej Schrieber E 08/20/2016, 9:12 AM

## 2016-08-20 NOTE — Progress Notes (Signed)
Social Work Assessment and Plan Social Work Assessment and Plan  Patient Details  Name: [name] MRN: [id number] Date of Birth: 01/08/1971  Today's Date: 08/20/2016  Problem List:  Patient Active Problem List   Diagnosis Date Noted  . Atypical chest pain   . Stroke due to embolism (HCC) 08/19/2016  . Diabetes mellitus (HCC)   . Hypervolemia   . Stroke (HCC)   . Coronary artery disease involving coronary bypass graft of native heart without angina pectoris   . Hx of CABG   . Benign essential HTN   . Migraine without status migrainosus, not intractable   . Labile blood glucose   . Diabetes mellitus type 2 in obese (HCC)   . Morbid obesity (HCC)   . Tachypnea   . Hypoalbuminemia due to protein-calorie malnutrition (HCC)   . Leukocytosis   . Acute blood loss anemia   . Pressure injury of skin 08/12/2016  . Coronary artery disease involving native heart without angina pectoris   . 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 ACS (acute coronary syndrome) (HCC) 08/07/2016  . Hypertension 08/07/2016  . Diabetes mellitus with cardiac complication (HCC) 08/07/2016  . Non-STEMI (non-ST elevated myocardial infarction) (HCC) 08/07/2016  . NSTEMI (non-ST elevated myocardial infarction) (HCC) 08/07/2016   Past Medical History:  Past Medical History:  Diagnosis Date  . Headache    due to elevated  BP  . History of kidney stones    "passed them"  . Hypertension   . Pancreatic injury    "damage to pancrease related to gallbladder OR"  . Pneumonia X 1  . Type II diabetes mellitus (HCC)    Past Surgical History:  Past Surgical History:  Procedure Laterality Date  . CARDIAC CATHETERIZATION  08/07/2016  . CARDIAC CATHETERIZATION N/A 08/07/2016   Procedure: Left Heart Cath and Coronary Angiography;  Surgeon: Lyn RecordsHenry W Smith, MD;  [facility];  Service: Cardiovascular;  Laterality: N/A;  . CARDIAC CATHETERIZATION N/A 08/07/2016   Procedure: Intravascular Pressure Wire/FFR  Study;  Surgeon: Lyn RecordsHenry W Smith, MD;  [facility];  Service: Cardiovascular;  Laterality: N/A;  . CORONARY ARTERY BYPASS GRAFT N/A 08/09/2016   Procedure: CORONARY ARTERY BYPASS GRAFTING (CABG) x three,SVG to distal circ OM3, SVG to Om1, LIMA to LAD-using left internal mammary artery and right leg greater saphenous vein harvested endoscopically, Sternal plating - right side;  Surgeon: Delight OvensEdward B Gerhardt, MD;  [facility];  Service: Open Heart Surgery;  Laterality: N/A;  . FACIAL RECONSTRUCTION SURGERY  2009   "facial fractures"  . FRACTURE SURGERY    . LAPAROSCOPIC CHOLECYSTECTOMY    . TEE WITHOUT CARDIOVERSION N/A 08/09/2016   Procedure: TRANSESOPHAGEAL ECHOCARDIOGRAM (TEE);  Surgeon: Delight OvensEdward B Gerhardt, MD;  [facility];  Service: Open Heart Surgery;  Laterality: N/A;  . TUBAL LIGATION     Social History:  reports that she has been smoking Cigarettes.  She has a 10.00 pack-year smoking history. She has never used smokeless tobacco. She reports that she does not drink alcohol or use drugs.  Family / Support Systems Marital Status: Married Patient Roles: Spouse, Parent, Other (Comment) (employee) Spouse/Significant Other: [name]-husband [phone] Children:  Leondra-daughter in the home but goes to school and works Other Supports: Wendee BeaversMarie Chavis- Mom-[phone] Anticipated Caregiver: Husband, daughter and Mom-[place] Ability/Limitations of Caregiver: Husband and daughter work during the day, but Mom is a retired LawyerCNA and can assist.  Caregiver Availability: 24/7 Family Dynamics: Pt is  close with her family, they have three children who two are local who are involved. She has co-workers and friends who will visit also. Pt wants to recover and get back as much function as she can while here. She is not used to being dependent upon others.  Social History Preferred language: English Religion: Baptist Cultural Background: No  issues Education: [employer] Read: Yes Write: Yes Employment Status: Employed Name of Employer: [employer], drilling of Employment: 1 Return to Work Plans: Would like to return when able and recovered from stroke Legal Hisotry/Current Legal Issues: No issues Guardian/Conservator: Noe-according to MD pt is capable of making her own decisions while here. Will make sure a family is present also unsrue how reliable she is with her answers   Abuse/Neglect Physical Abuse: Denies Verbal Abuse: Denies Sexual Abuse: Denies Exploitation of patient/patient's resources: Denies Self-Neglect: Denies  Emotional Status Pt's affect, behavior adn adjustment status: Pt is motivated and wants to progress in her therapies, she had a rough morning and is waiting for Cardiology PA to see her, due to chest pain this am. She has always been one to be independent and cared for others, not the other way around. Recent Psychosocial Issues: other health issues-non-conmpliant with going to the MD aware needs to change this. Pyschiatric History: No history deferred depression screening due to not feeling well and having a hard morning. This worker feels pt would benefit from seeing neuro-psych while here for coping. Substance Abuse History: Tobacco-plans to quit along with her husband  Patient / Family Perceptions, Expectations & Goals Pt/Family understanding of illness & functional limitations: Pt can explain her heart surgery and stroke after the surgery. She does talk with the MD and feels her questions are being answered. Her husband or Mom talks with MD when here. Premorbid pt/family roles/activities: Wife, mother, employee, daughter, home owner, church member, etc Anticipated changes in roles/activities/participation: resume Pt/family expectations/goals: Pt states: " I want to be able to take care of myself and return to work, I know it will take time but I am patient."  Mom states: " We will all  help her get back on track and recover from this she is a strong woman."  [employer]: None Premorbid Home Care/DME Agencies: None Transportation available at discharge: family Resource referrals recommended: Neuropsychology, Support group (specify)  Discharge Planning Living Arrangements: Spouse/significant other, Children Support Systems: Spouse/significant other, Children, Parent, Other relatives, Friends/neighbors, Psychologist, clinical community Type of Residence: Private residence Insurance Resources: Media planner (specify) Herbalist) Financial Resources: Employment, Garment/textile technologist Screen Referred: No Living Expenses: Psychologist, sport and exercise Management: Patient, Spouse Does the patient have any problems obtaining your medications?: Yes (Describe) (co-pays are high) Home Management: patient and daughter Patient/Family Preliminary Plans: Return home with husband and daughter who work during the day, but Mom is coming to stay and assist when goes home. So pt will have 24 hr care at discharge. Will await team's evaluations and work on a safe plan. Pt has ben cleared to participate in therapies this afternoon. Social Work Anticipated Follow Up Needs: HH/OP, Support Group  Clinical Impression Pleasant female who wants to work hard and do well, but is limited today due to chest pain and blood pressure issues. She was cleared by PA for this afternoon to do therapies. She has a supportive involved family who are willing to assist her  At discharge. Will work on discharge and have neuro-psych see for coping while here. Await therapy evaluations.  Larene Pickett  G 08/20/2016, 3:27 PM

## 2016-08-21 ENCOUNTER — Telehealth: Payer: Self-pay | Admitting: Cardiology

## 2016-08-21 ENCOUNTER — Inpatient Hospital Stay (HOSPITAL_COMMUNITY): Payer: BLUE CROSS/BLUE SHIELD | Admitting: Physical Therapy

## 2016-08-21 ENCOUNTER — Inpatient Hospital Stay (HOSPITAL_COMMUNITY): Payer: BLUE CROSS/BLUE SHIELD

## 2016-08-21 ENCOUNTER — Inpatient Hospital Stay (HOSPITAL_COMMUNITY): Payer: BLUE CROSS/BLUE SHIELD | Admitting: Speech Pathology

## 2016-08-21 ENCOUNTER — Inpatient Hospital Stay (HOSPITAL_COMMUNITY): Payer: BLUE CROSS/BLUE SHIELD | Admitting: Occupational Therapy

## 2016-08-21 DIAGNOSIS — G8191 Hemiplegia, unspecified affecting right dominant side: Secondary | ICD-10-CM

## 2016-08-21 LAB — URINALYSIS, ROUTINE W REFLEX MICROSCOPIC
BILIRUBIN URINE: NEGATIVE
Glucose, UA: NEGATIVE mg/dL
HGB URINE DIPSTICK: NEGATIVE
KETONES UR: NEGATIVE mg/dL
Leukocytes, UA: NEGATIVE
NITRITE: NEGATIVE
PROTEIN: NEGATIVE mg/dL
Specific Gravity, Urine: 1.016 (ref 1.005–1.030)
pH: 5 (ref 5.0–8.0)

## 2016-08-21 LAB — GLUCOSE, CAPILLARY
GLUCOSE-CAPILLARY: 85 mg/dL (ref 65–99)
GLUCOSE-CAPILLARY: 226 mg/dL — AB (ref 65–99)
Glucose-Capillary: 216 mg/dL — ABNORMAL HIGH (ref 65–99)
Glucose-Capillary: 87 mg/dL (ref 65–99)
Glucose-Capillary: 214 mg/dL — ABNORMAL HIGH (ref 65–99)

## 2016-08-21 MED ORDER — POTASSIUM CHLORIDE CRYS ER 10 MEQ PO TBCR
10.0000 meq | EXTENDED_RELEASE_TABLET | Freq: Every day | ORAL | Status: DC
  Administered 2016-08-22 – 2016-08-23 (×2): 10 meq via ORAL
  Filled 2016-08-21 (×2): qty 1

## 2016-08-21 MED ORDER — ALPRAZOLAM 0.25 MG PO TABS
0.2500 mg | ORAL_TABLET | Freq: Three times a day (TID) | ORAL | Status: DC | PRN
  Filled 2016-08-21 (×2): qty 1

## 2016-08-21 MED ORDER — LIDOCAINE 5 % EX PTCH
2.0000 | MEDICATED_PATCH | CUTANEOUS | Status: DC
  Administered 2016-08-21 – 2016-08-23 (×3): 2 via TRANSDERMAL
  Filled 2016-08-21 (×3): qty 2

## 2016-08-21 NOTE — Progress Notes (Signed)
Physical Therapy Session Note  Patient Details  Name: [name] MRN: [id number] Date of Birth: 1970-11-06  Today's Date: 08/21/2016 PT Individual Time: 1000-1100 PT Individual Time Calculation (min): 60 min   Short Term Goals: Week 1:  PT Short Term Goal 1 (Week 1): Patient will performed ambulatory transfer with least restricitive assisitve device supervision  PT Short Term Goal 2 (Week 1): Patient will ambulate with least restricitive assisitve device  100 feet supervision  PT Short Term Goal 3 (Week 1): Patient will negotiaite 4 steps with min assist with handrail  PT Short Term Goal 4 (Week 1): Patient will be able to identify sternal cautions without cues.  PT Short Term Goal 5 (Week 1): Patient will be able perform bed mobility with close supervision   Skilled Therapeutic Interventions/Progress Updates:    no c/o pain, requires encouragement to participate in therapy 2/2 c/o nausea, RN in to administer medications.  Orthostatic vitals assessed and WNL (see vitals flowsheet).   Session focus on activity tolerance, gait training, stair negotiation, w/c fitting and propulsion, and pt education throughout session.  Pt performs transfers throughout session with min verbal cues for maintaining sternal precautions and supervision/steady assist for transfer.  Gait training throughout unit, max distance 300', without AD, occasional hand hold assist and overall steady assist for balance.  Stair negotiation with min assist for balance and verbal cues for decreasing weight bearing through UEs.  PT switched out w/c for 18x16 hemi height w/c for improved positioning and propulsion with BLEs.  Pt propelled w/c x100' with BLEs and supervision.  Pt left sitting upright in w/c with call bell in reach and needs met at end of session, instructed to call staff for assistance if needing to get out of w/c and pt verbalized understanding.   Therapy Documentation Precautions:   Precautions Precautions: Fall, Sternal Restrictions Weight Bearing Restrictions: Yes (sternal precaution) RUE Weight Bearing:  (sternal precautions)   See Function Navigator for Current Functional Status.   Therapy/Group: Individual Therapy  Earnest Conroy Penven-Crew 08/21/2016, 5:07 PM

## 2016-08-21 NOTE — Plan of Care (Signed)
Problem: RH BOWEL ELIMINATION Goal: RH STG MANAGE BOWEL WITH ASSISTANCE STG Manage Bowel with mod I Assistance.   Outcome: Not Progressing No BM since 08/18/2016

## 2016-08-21 NOTE — Progress Notes (Signed)
Subjective/Complaints: No further episodes of nausea or sweating. Tolerating therapy well. ROS- neg CP, SOB, N/V/D Objective: Vital Signs: Blood pressure (!) 141/79, pulse 72, temperature 98.4 F (36.9 C), temperature source Oral, resp. rate 18, height '4\' 11"'  (1.499 m), weight 80.1 kg (176 lb 9.6 oz), SpO2 98 %. Dg Chest 2 View  Result Date: 08/20/2016 CLINICAL DATA:  Post- operative pneumonia, status post CABG eleven days ago EXAM: CHEST  2 VIEW COMPARISON:  Portable chest x-ray of August 15, 2016 FINDINGS: The lungs are mildly hypoinflated. There is left basilar atelectasis or pneumonia. The cardiac silhouette is enlarged and there are post CABG changes. There is no significant pulmonary vascular congestion. There small bilateral pleural effusions greatest on the left. The left-sided PICC line tip projects over the distal aspect of the SVC. IMPRESSION: Persistent left basilar atelectasis or pneumonia, slightly improved. Persistent small bilateral pleural effusions. No overt pulmonary edema. Electronically Signed   By: David  Martinique M.D.   On: 08/20/2016 07:15   Results for orders placed or performed during the hospital encounter of 08/19/16 (from the past 72 hour(s))  Glucose, capillary     Status: Abnormal   Collection Time: 08/19/16  8:35 PM  Result Value Ref Range   Glucose-Capillary 132 (H) 65 - 99 mg/dL  CBC WITH DIFFERENTIAL     Status: Abnormal   Collection Time: 08/20/16  4:21 AM  Result Value Ref Range   WBC 12.4 (H) 4.0 - 10.5 K/uL   RBC 3.43 (L) 3.87 - 5.11 MIL/uL   Hemoglobin 10.1 (L) 12.0 - 15.0 g/dL   HCT 31.1 (L) 36.0 - 46.0 %   MCV 90.7 78.0 - 100.0 fL   MCH 29.4 26.0 - 34.0 pg   MCHC 32.5 30.0 - 36.0 g/dL   RDW 14.1 11.5 - 15.5 %   Platelets 670 (H) 150 - 400 K/uL   Neutrophils Relative % 65 %   Neutro Abs 8.0 (H) 1.7 - 7.7 K/uL   Lymphocytes Relative 25 %   Lymphs Abs 3.2 0.7 - 4.0 K/uL   Monocytes Relative 5 %   Monocytes Absolute 0.6 0.1 - 1.0 K/uL   Eosinophils Relative 5 %   Eosinophils Absolute 0.6 0.0 - 0.7 K/uL   Basophils Relative 0 %   Basophils Absolute 0.0 0.0 - 0.1 K/uL  Comprehensive metabolic panel     Status: Abnormal   Collection Time: 08/20/16  4:21 AM  Result Value Ref Range   Sodium 140 135 - 145 mmol/L   Potassium 3.5 3.5 - 5.1 mmol/L   Chloride 105 101 - 111 mmol/L   CO2 28 22 - 32 mmol/L   Glucose, Bld 81 65 - 99 mg/dL   BUN 14 6 - 20 mg/dL   Creatinine, Ser 0.58 0.44 - 1.00 mg/dL   Calcium 8.9 8.9 - 10.3 mg/dL   Total Protein 6.4 (L) 6.5 - 8.1 g/dL   Albumin 2.3 (L) 3.5 - 5.0 g/dL   AST 25 15 - 41 U/L   ALT 28 14 - 54 U/L   Alkaline Phosphatase 78 38 - 126 U/L   Total Bilirubin 0.5 0.3 - 1.2 mg/dL   GFR calc non Af Amer >60 >60 mL/min   GFR calc Af Amer >60 >60 mL/min    Comment: (NOTE) The eGFR has been calculated using the CKD EPI equation. This calculation has not been validated in all clinical situations. eGFR's persistently <60 mL/min signify possible Chronic Kidney Disease.    Anion gap 7 5 -  15  Glucose, capillary     Status: None   Collection Time: 08/20/16  6:28 AM  Result Value Ref Range   Glucose-Capillary 84 65 - 99 mg/dL  Glucose, capillary     Status: Abnormal   Collection Time: 08/20/16  9:37 AM  Result Value Ref Range   Glucose-Capillary 182 (H) 65 - 99 mg/dL   Comment 1 Notify RN   Glucose, capillary     Status: Abnormal   Collection Time: 08/20/16 12:06 PM  Result Value Ref Range   Glucose-Capillary 149 (H) 65 - 99 mg/dL   Comment 1 Notify RN   Glucose, capillary     Status: Abnormal   Collection Time: 08/20/16  5:15 PM  Result Value Ref Range   Glucose-Capillary 158 (H) 65 - 99 mg/dL   Comment 1 Notify RN   Glucose, capillary     Status: Abnormal   Collection Time: 08/20/16  8:55 PM  Result Value Ref Range   Glucose-Capillary 177 (H) 65 - 99 mg/dL  Glucose, capillary     Status: None   Collection Time: 08/21/16  6:55 AM  Result Value Ref Range   Glucose-Capillary 85  65 - 99 mg/dL     HEENT: normal Cardio: RRR and No rubs, murmurs or ES Resp: CTA B/L and Unlabored GI: BS positive and NT, ND Extremity:  Pulses positive and No Edema Skin:   Wound CDI midline sternotomy, drain sites with sutures Neuro: Musc/Skel:  Other no pain with ROM in UE or LE Gen NAD   Assessment/Plan: 1. 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 Physiatrist is providing close team supervision and 24 hour management of active medical problems listed below. Physiatrist and rehab team continue to assess barriers to discharge/monitor patient progress toward functional and medical goals. FIM: Function - Bathing Position: Sitting EOB Body parts bathed by patient: Right arm, Left arm, Chest, Abdomen, Front perineal area, Buttocks Bathing not applicable: Right upper leg, Left upper leg, Right lower leg, Left lower leg, Back Assist Level:  (Mod assist)  Function- Upper Body Dressing/Undressing What is the patient wearing?: Pull over shirt/dress Pull over shirt/dress - Perfomed by patient: Thread/unthread right sleeve, Thread/unthread left sleeve, Put head through opening, Pull shirt over trunk Assist Level: Touching or steadying assistance(Pt > 75%) (completed in standing) Function - Lower Body Dressing/Undressing What is the patient wearing?: Underwear, Pants Position: Sitting EOB Underwear - Performed by patient: Thread/unthread right underwear leg, Thread/unthread left underwear leg, Pull underwear up/down Pants- Performed by patient: Thread/unthread left pants leg, Pull pants up/down Pants- Performed by helper: Thread/unthread right pants leg Assist for lower body dressing: Touching or steadying assistance (Pt > 75%)  Function - Toileting Toileting steps completed by patient: Performs perineal hygiene, Adjust clothing after toileting, Adjust clothing prior  to toileting Toileting Assistive Devices: Grab bar or rail Assist level: Touching or steadying assistance (Pt.75%)  Function - Air cabin crew transfer assistive device:  (wheelchair) Assist level to toilet: Touching or steadying assistance (Pt > 75%) Assist level from toilet: Touching or steadying assistance (Pt > 75%)  Function - Chair/bed transfer Chair/bed transfer method: Stand pivot Chair/bed transfer assist level: Moderate assist (Pt 50 - 74%/lift or lower) Chair/bed transfer details: Tactile cues for weight shifting, Verbal cues for precautions/safety  Function - Locomotion: Wheelchair Will patient use wheelchair at discharge?: No Type: Manual Max wheelchair distance: 35 Assist Level: Supervision or verbal cues  Wheel 50 feet with 2 turns activity did not occur: Safety/medical concerns (decreased endurance) Wheel 150 feet activity did not occur: Safety/medical concerns Turns around,maneuvers to table,bed, and toilet,negotiates 3% grade,maneuvers on rugs and over doorsills: No Function - Locomotion: Ambulation Ambulation activity did not occur: Safety/medical concerns (Patient with decreased BP, elvevated HR, RR, diaphoretic, nausea and chest pain deferred at this time.) Walk 10 feet activity did not occur: Safety/medical concerns Walk 50 feet with 2 turns activity did not occur: Safety/medical concerns Walk 150 feet activity did not occur: Safety/medical concerns Walk 10 feet on uneven surfaces activity did not occur: Safety/medical concerns  Function - Comprehension Comprehension: Auditory Comprehension assist level: Follows complex conversation/direction with extra time/assistive device  Function - Expression Expression: Verbal Expression assist level: Expresses complex ideas: With extra time/assistive device  Function - Social Interaction Social Interaction assist level: Interacts appropriately with others with medication or extra time (anti-anxiety,  antidepressant).  Function - Problem Solving Problem solving assist level: Solves complex problems: With extra time  Function - Memory Memory assist level: More than reasonable amount of time Patient normally able to recall (first 3 days only): Current season, Location of own room, Staff names and faces, That he or she is in a hospital  Medical Problem List and Plan: 1.  Weakness, poor activity tolerance, abnormality of gait secondary to multiple small vessel infarcts. Team conference today please see physician documentation under team conference tab, met with team face-to-face to discuss problems,progress, and goals. Formulized individual treatment plan based on medical history, underlying problem and comorbidities. 2.  DVT Prophylaxis/Anticoagulation: Pharmaceutical: Lovenox 3. Pain Management: continue tramadol prn for pain.  4. Mood: LCSW to follow for evaluation and support.  5. Neuropsych: This patient is capable of making decisions on her own behalf. 6. Skin/Wound Care: Routine pressure relief measures 7. Fluids/Electrolytes/Nutrition: Monitor I/O. Check lytes in am. 8. CAD s/p CABG: On ASA, plavix, lipitor and BB 9. HTN:  Monitor BP bid. On lasix, metoprolol and Prinivil--dose increased today for tighter control.  Vitals:   08/20/16 2139 08/21/16 0431  BP: (!) 147/81 (!) 141/79  Pulse: 80 72  Resp:  18  Temp:  98.4 F (36.9 C)   10.T2DM: Monitor BS ac/hs and use SSI for elevated BS. Reports that she can not afford lantus due to cost and was using NPH at home.  CBG (last 3)   Recent Labs  08/20/16 1715 08/20/16 2055 08/21/16 0655  GLUCAP 158* 177* 85    11. Leucocytosis: Monitor for signs of infection. Has completed 7 day course of antibiotic therapy  WBC 12.4 down from 15K 12. Volume overload: Check weights daily. Continue lasix and monitor for signs of overload.  13. ABLA: recheck Hgb 10.1 stable 14. Morbid obesity: Encourage weight loss 15. Hypoalbuminemia. Add  pro-stat  LOS (Days) 2 A FACE TO FACE EVALUATION WAS PERFORMED  KIRSTEINS,ANDREW E 08/21/2016, 8:40 AM

## 2016-08-21 NOTE — Patient Care Conference (Signed)
Inpatient RehabilitationTeam Conference and Plan of Care Update Date: 08/21/2016   Time: 11:30 AM    Patient Name: [name]      Medical Record Number: [id number]  Date of Birth: 07/09/1971 Sex: Female         Room/Bed: 4W01C/4W01C-01 Payor Info: Payor: BLUE CROSS BLUE SHIELD / Plan: BCBS OTHER / Product Type: *No Product type* /    Admitting Diagnosis: CVA  Admit Date/Time:  08/19/2016  6:52 PM Admission Comments: No comment available   Primary Diagnosis:  <principal problem not specified> Principal Problem: <principal problem not specified>  Patient Active Problem List   Diagnosis Date Noted  . Atypical chest pain   . Stroke due to embolism (HCC) 08/19/2016  . Diabetes mellitus (HCC)   . Hypervolemia   . Stroke (HCC)   . Coronary artery disease involving coronary bypass graft of native heart without angina pectoris   . Hx of CABG   . Benign essential HTN   . Migraine without status migrainosus, not intractable   . Labile blood glucose   . Diabetes mellitus type 2 in obese (HCC)   . Morbid obesity (HCC)   . Tachypnea   . Hypoalbuminemia due to protein-calorie malnutrition (HCC)   . Leukocytosis   . Acute blood loss anemia   . Pressure injury of skin 08/12/2016  . Coronary artery disease involving native heart without angina pectoris   . 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 ACS (acute coronary syndrome) (HCC) 08/07/2016  . Hypertension 08/07/2016  . Diabetes mellitus with cardiac complication (HCC) 08/07/2016  . Non-STEMI (non-ST elevated myocardial infarction) (HCC) 08/07/2016  . NSTEMI (non-ST elevated myocardial infarction) (HCC) 08/07/2016    Expected Discharge Date: Expected Discharge Date: 08/29/16  Team Members Present: Physician leading conference: Dr. Claudette LawsAndrew Kirsteins Social Worker Present: Dossie DerBecky Mariena Meares, LCSW Nurse Present: Carmie EndAngie Joyce, RN PT Present: Teodoro Kilaitlin Penven-Crew, Nita SicklePT;Rodney Wishart, PT OT Present: Roney MansJennifer Smith, Suszanne ConnersT;Sarah Hoxie, OT SLP Present:  Feliberto Gottronourtney Payne, SLP PPS Coordinator present : Tora DuckMarie Noel, RN, CRRN     Current Status/Progress Goal Weekly Team Focus  Medical   Patient had episode of nausea and diaphoresis, and evaluated by cardiothoracic surgery as well as cardiology. No cardiac source identified.  Optimize postoperative healing, status post CABG, improve physical endurance for rehabilitation  Initiate rehabilitation program, improve activity tolerance   Bowel/Bladder   continent of bowel & bladder, LBM 08/18/16, refuses senna plus  remain continent, no episodes of constipation  continue to educate & assess for changes   Swallow/Nutrition/ Hydration             ADL's   Min assist overall  Mod I  sit > stand with sternal precautions, RUE NMR, vision, functional transfers, activity tolerance   Mobility   min guard overall without AD  mod I  balance, activity tolerance, gait without AD, stair negotiation,    Communication             Safety/Cognition/ Behavioral Observations  Supervision  Mod I  complex problem solving, recall, anticipatory awareness, alternating attention    Pain   c/o pain yesterday for the 1st time in days to the chest area, pain scale 6/10, she is on sternal precautions & was 1st day in rehab, tramadol given once, also has tylenol Q4hrs prn, not used  pain scale less than 4  assess & treat as needed   Skin   sternal scar, no other skin issues  no new areas of skin breakdown  assess skin q  shift      *See Care Plan and progress notes for long and short-term goals.  Barriers to Discharge: Patient tends to minimize deficits.    Possible Resolutions to Barriers:  Continue rehabilitation, family education    Discharge Planning/Teaching Needs:  Home with husband, daughter and Mom coming to assist with her care, while husband and daughter work.      Team Discussion:  Goals mod/i level, currently min assist level. MD adjusting meds. Cardiology saw yesterday after episode of chest pain. Sutures  removed, can shower with OT. Activity tolerance poor and working on endurance. Pain limiting pt but learning to work with and MD addressing this issue. DC target 2/1  Revisions to Treatment Plan:  New eval   Continued Need for Acute Rehabilitation Level of Care: The patient requires daily medical management by a physician with specialized training in physical medicine and rehabilitation for the following conditions: Daily direction of a multidisciplinary physical rehabilitation program to ensure safe treatment while eliciting the highest outcome that is of practical value to the patient.: Yes Daily medical management of patient stability for increased activity during participation in an intensive rehabilitation regime.: Yes Daily analysis of laboratory values and/or radiology reports with any subsequent need for medication adjustment of medical intervention for : Post surgical problems;Neurological problems  Lucy Chris 08/21/2016, 12:46 PM

## 2016-08-21 NOTE — Progress Notes (Signed)
       [address]       [place] [id number]             [phone]         Subjective: Says she is feeling better and would like to go home.   Objective: Vital signs in last 24 hours: Temp:  [98.4 F (36.9 C)-98.6 F (37 C)] 98.4 F (36.9 C) (01/24 0431) Pulse Rate:  [72-96] 72 (01/24 0431) Resp:  [18] 18 (01/24 0431) BP: (141-160)/(79-90) 141/79 (01/24 0431) SpO2:  [98 %-99 %] 98 % (01/24 0431) Weight:  [176 lb 9.6 oz (80.1 kg)] 176 lb 9.6 oz (80.1 kg) (01/24 0431)     Intake/Output from previous day: 01/23 0701 - 01/24 0700 In: 620 [P.O.:600; I.V.:20] Out: -  Intake/Output this shift: Total I/O In: 240 [P.O.:240] Out: -   General appearance: alert, cooperative and no distress Heart: regular rate and rhythm, S1, S2 normal, no murmur, click, rub or gallop Lungs: clear to auscultation bilaterally Abdomen: soft, non-tender; bowel sounds normal; no masses,  no organomegaly Extremities: extremities normal, atraumatic, no cyanosis or edema Wound: clean and dry  Lab Results:  Recent Labs  08/20/16 0421  WBC 12.4*  HGB 10.1*  HCT 31.1*  PLT 670*   BMET:  Recent Labs  08/20/16 0421  NA 140  K 3.5  CL 105  CO2 28  GLUCOSE 81  BUN 14  CREATININE 0.58  CALCIUM 8.9    PT/INR: No results for input(s): LABPROT, INR in the last 72 hours. ABG    Component Value Date/Time   PHART 7.379 08/09/2016 1854   HCO3 22.5 08/09/2016 1854   TCO2 22 08/10/2016 1606   ACIDBASEDEF 2.0 08/09/2016 1854   O2SAT 99.0 08/09/2016 1854   CBG (last 3)   Recent Labs  08/20/16 1715 08/20/16 2055 08/21/16 0655  GLUCAP 158* 177* 85    Assessment/Plan: Patient is now in rehabilitation. Vasovagal episode yesterday. Cardiology was consulted and did not believe it was anything ischemic with recent revascularization. Wounds are healing well without signs of infection. Labs are stable. WBC continues to trend down. Afebrile.    LOS: 2 days    Sharlene Doryessa N  Conte 08/21/2016

## 2016-08-21 NOTE — Progress Notes (Signed)
Occupational Therapy Session Note  Patient Details  Name: Franne GripGegorria Cherry-[name] MRN: [id number] Date of Birth: 11/19/1970  Today's Date: 08/21/2016 OT Individual Time: 1333-1501 OT Individual Time Calculation (min): 88 min    Short Term Goals: Week 1:  OT Short Term Goal 1 (Week 1): Pt will complete bathing at supervision level OT Short Term Goal 2 (Week 1): Pt will complete 2 grooming tasks in standing at supervision level OT Short Term Goal 3 (Week 1): Pt will utilize RUE as dominant UE at Mod I level OT Short Term Goal 4 (Week 1): Pt will complete housekeeping tasks with focus on endurance and activity tolerance at supervision level OT Short Term Goal 5 (Week 1): Pt will complete LB dressing at supervision level  Skilled Therapeutic Interventions/Progress Updates:    Treatment session with focus on activity tolerance, functional transfers, dynamic standing balance, and processing speed.  Pt received in bed reporting fatigue but willing to engage in therapy session.  Pt ambulated to room shower with min guard assist for stability.  Pt completed toileting with steady assist.  Bathing completed at sit > stand level with alternating steady assist to supervision as challenge and energy level fluctuated.  Pt completed LB dressing in standing with cues for increased safety at sit > stand level, however pt electing to complete in standing with min assist for balance.  Pt reports increased fatigue post shower, vitals assess see below.  Returned to bed for a rest break while therapist gathered next activity.  Completed activities from bed level with focus on sequencing and processing speed with identifying concrete and abstract terms to identify incorrect term and then later to provide additional term to fit category.  Pt with increase difficulty coming up with additional words for category requiring increased time.  Engaged in discussion of carryover into home and work tasks with processing speed and  sequencing of tasks.  Provided pt with pre-writing task to address fine motor control with writing and visual scanning.  Therapy Documentation Precautions:  Precautions Precautions: Fall, Sternal Restrictions Weight Bearing Restrictions: Yes (sternal precaution) RUE Weight Bearing:  (sternal precautions) General:   Vital Signs: Therapy Vitals Temp: 98.8 F (37.1 C) Temp Source: Oral Pulse Rate: 87 Resp: 18 BP: 133/73 Patient Position (if appropriate): Lying Oxygen Therapy SpO2: 98 % O2 Device: Not Delivered Pain:  Pt with no c/o pain  See Function Navigator for Current Functional Status.   Therapy/Group: Individual Therapy  [name], Gifford Ballon 08/21/2016, 3:11 PM

## 2016-08-21 NOTE — Telephone Encounter (Signed)
Closed encounter °

## 2016-08-21 NOTE — Progress Notes (Signed)
Physical Therapy Session Note  Patient Details  Name: [name] MRN: [id number] Date of Birth: [birth year]  Today's Date: 08/21/2016 PT Individual Time: 1130-1200 PT Individual Time Calculation (min): 30 min   Short Term Goals: Week 1:  PT Short Term Goal 1 (Week 1): Patient will performed ambulatory transfer with least restricitive assisitve device supervision  PT Short Term Goal 2 (Week 1): Patient will ambulate with least restricitive assisitve device  100 feet supervision  PT Short Term Goal 3 (Week 1): Patient will negotiaite 4 steps with min assist with handrail  PT Short Term Goal 4 (Week 1): Patient will be able to identify sternal cautions without cues.  PT Short Term Goal 5 (Week 1): Patient will be able perform bed mobility with close supervision   Skilled Therapeutic Interventions/Progress Updates:  Pt had returned to bed, w/c brakes noted to be unlocked, pt states she transferred herself back to bed.  PT provided education on safety plan and need to call for staff for assistance.    Gait training to and from ortho gym with steady assist.  Car transfer with min assist and verbal cues for safety.  Pt returned to room at end of session and positioned in bed with verbal cues to maintain sternal precautions.  Call bell in reach and needs met.     Therapy Documentation Precautions:  Precautions Precautions: Fall, Sternal Restrictions Weight Bearing Restrictions: Yes (sternal precaution) RUE Weight Bearing:  (sternal precautions)   See Function Navigator for Current Functional Status.   Therapy/Group: Individual Therapy  Earnest Conroy Penven-Crew 08/21/2016, 5:27 PM

## 2016-08-21 NOTE — Progress Notes (Signed)
Social Work Patient ID: [name], female   DOB: 05-05-1971, 46 y.o.   MRN: [id number]  Met with pt to discuss team conference goals mod/i level and target discharge date 2/1. Aware if progresses quicker can Move up date. Pt is having pain and medical issues, MD are still dealing with and adjusting medicines. She is agreeable to this date and will work toward this. She is feeling better today than yesterday. Will work on discharge needs.

## 2016-08-21 NOTE — Progress Notes (Signed)
Speech Language Pathology Daily Session Note  Patient Details  Name: [name] MRN: [id number] Date of Birth: 03/16/1971  Today's Date: 08/21/2016 SLP Individual Time: 1300-1330 SLP Individual Time Calculation (min): 30 min  Short Term Goals: Week 1: SLP Short Term Goal 1 (Week 1): Pt will demonstrate alternating attention between complex tasks for ~30 minutes with supervision verbal cues.  SLP Short Term Goal 2 (Week 1): Pt will desmonstrate anticipatory awareness and identify 3 tasks that she can participate in at home safely with suppervision cues.  SLP Short Term Goal 3 (Week 1): Pt will recall and implement sternal precautions with Mod I.  SLP Short Term Goal 4 (Week 1): Pt will demonstrate ability to complete complex reasoning tasks with supervision cues.   Skilled Therapeutic Interventions: Skilled treatment session focused on cognitive goals. SLP facilitated session by providing Min A verbal and question cues for recall of current medications and their functions. Plan on organizing a 2X/day pill box at next session. Patient also recalled sternal precautions and verbalized how to appropriately transfer utilizing precautions with Mod I. Patient left supine in bed with all needs within reach. Continue with current plan of care.      Function:  Cognition Comprehension Comprehension assist level: Follows complex conversation/direction with extra time/assistive device  Expression   Expression assist level: Expresses basic 90% of the time/requires cueing < 10% of the time.  Social Interaction Social Interaction assist level: Interacts appropriately 90% of the time - Needs monitoring or encouragement for participation or interaction.  Problem Solving Problem solving assist level: Solves basic problems with no assist  Memory Memory assist level: Recognizes or recalls 90% of the time/requires cueing < 10% of the time    Pain No/Denies Pain   Therapy/Group: Individual  Therapy  [name] 08/21/2016, 4:00 PM

## 2016-08-21 NOTE — Progress Notes (Signed)
Occupational Therapy Session Note  Patient Details  Name: [name] MRN: [id number] Date of Birth: 12/29/1970  Today's Date: 08/21/2016 OT Individual Time: 8469-62950900-0945 OT Individual Time Calculation (min): 45 min    Short Term Goals: Week 1:  OT Short Term Goal 1 (Week 1): Pt will complete bathing at supervision level OT Short Term Goal 2 (Week 1): Pt will complete 2 grooming tasks in standing at supervision level OT Short Term Goal 3 (Week 1): Pt will utilize RUE as dominant UE at Mod I level OT Short Term Goal 4 (Week 1): Pt will complete housekeeping tasks with focus on endurance and activity tolerance at supervision level OT Short Term Goal 5 (Week 1): Pt will complete LB dressing at supervision level  Skilled Therapeutic Interventions/Progress Updates: ADL-retraining with focus on improved activity tolerance, transfers, and adapted bathing/dressing skills.   Pt received supine in bed with heat pad over her chest.   Pt reports only feeling sluggish and nauseous.   With extra time, set up and steadying assist pt performed roll to her right, sat up at edge of bed and completed transfer to w/c in prep for planned shower.  Pt then gathered her clothing for dressing but declined shower d/t fatigue and requested BADL at sink.   OT provided only setup for bathing and pt completed upper body bathing and dressing at sink in 15 minutes, slowly, although without need for physical assist.  Pt reported return of nausea and requested assist to toilet but deferred toileting to return to bed d/t complaint of progressive fatigue.   BP/HPR/02 were assessed as within normal limits and pt was assisted back to bed for rest at end of session with all needs placed within reach.  Therapy Documentation Precautions:  Precautions Precautions: Fall, Sternal Restrictions Weight Bearing Restrictions: Yes (sternal precaution) RUE Weight Bearing:  (sternal precautions) Therapy Vitals Temp: 99.2 F (37.3  C) Temp Source: Oral Pulse Rate: 86 Resp: 17 BP: 136/88 Patient Position (if appropriate): Standing Oxygen Therapy SpO2: 100 % O2 Device: Not Delivered   Pain: Pain Assessment Pain Score: 0-No pain  See Function Navigator for Current Functional Status.   Therapy/Group: Individual Therapy  [name] 08/21/2016, 11:53 AM

## 2016-08-22 ENCOUNTER — Inpatient Hospital Stay (HOSPITAL_COMMUNITY): Payer: BLUE CROSS/BLUE SHIELD | Admitting: Occupational Therapy

## 2016-08-22 ENCOUNTER — Inpatient Hospital Stay (HOSPITAL_COMMUNITY): Payer: BLUE CROSS/BLUE SHIELD | Admitting: Physical Therapy

## 2016-08-22 DIAGNOSIS — E1159 Type 2 diabetes mellitus with other circulatory complications: Secondary | ICD-10-CM

## 2016-08-22 LAB — URINE CULTURE

## 2016-08-22 LAB — GLUCOSE, CAPILLARY
GLUCOSE-CAPILLARY: 120 mg/dL — AB (ref 65–99)
GLUCOSE-CAPILLARY: 264 mg/dL — AB (ref 65–99)
GLUCOSE-CAPILLARY: 116 mg/dL — AB (ref 65–99)
Glucose-Capillary: 66 mg/dL (ref 65–99)
Glucose-Capillary: 169 mg/dL — ABNORMAL HIGH (ref 65–99)

## 2016-08-22 LAB — BASIC METABOLIC PANEL
Anion gap: 5 (ref 5–15)
BUN: 16 mg/dL (ref 6–20)
CHLORIDE: 107 mmol/L (ref 101–111)
CO2: 27 mmol/L (ref 22–32)
CREATININE: 0.59 mg/dL (ref 0.44–1.00)
Calcium: 8.7 mg/dL — ABNORMAL LOW (ref 8.9–10.3)
GFR calc non Af Amer: >60 mL/min (ref >60)
Glucose, Bld: 122 mg/dL — ABNORMAL HIGH (ref 65–99)
POTASSIUM: 3.6 mmol/L (ref 3.5–5.1)
Sodium: 139 mmol/L (ref 135–145)

## 2016-08-22 NOTE — Progress Notes (Signed)
Occupational Therapy Session Note  Patient Details  Name: Franne GripGegorria Cherry-[name] MRN: [id number] Date of Birth: 12/02/1970  Today's Date: 08/22/2016 OT Individual Time: 0102-72530830-0927 and 1345-1435 OT Individual Time Calculation (min): 57 min and 50 min   Short Term Goals: Week 1:  OT Short Term Goal 1 (Week 1): Pt will complete bathing at supervision level OT Short Term Goal 2 (Week 1): Pt will complete 2 grooming tasks in standing at supervision level OT Short Term Goal 3 (Week 1): Pt will utilize RUE as dominant UE at Mod I level OT Short Term Goal 4 (Week 1): Pt will complete housekeeping tasks with focus on endurance and activity tolerance at supervision level OT Short Term Goal 5 (Week 1): Pt will complete LB dressing at supervision level  Skilled Therapeutic Interventions/Progress Updates:    1) Treatment session with focus on functional mobility, visual scanning, and RUE fine and gross motor control.  Pt received seated at EOB with RN present, pt reports already washing up and dressing this AM preferring early AM sessions.  Ambulated to Dayroom without AD with min guard to close supervision, pt utilizing rail in hallway for steadying.  Engaged in jigsaw puzzle in sitting and standing with focus on activity tolerance, visual scanning, and fine and gross motor control of dominant RUE.  Noted pt to demonstrate mild difficulty with picking up and turning over puzzle pieces occasionally when utilizing RUE.  Required increased time to complete task, however able to complete without additional cues or assistance.  Returned to room as above and left in sidelying in bed due to fatigue.  2) Treatment session with focus on functional mobility, sequencing and processing speed with cognitive task.  Pt ambulated to Dayroom with initial LOB to Rt when ambulating through room requiring assistance to correct.  Pt tends to veer Rt during ambulation but does not require physical assistance to correct.  Engaged in  visual scanning task with focus on reaction speed and use of descriptive terms to correctly convey words in sequence and providing directions.  Pt required increased time to come up with words intermittently, but overall successful with tasks.  Engaged in [employer] in standing with focus on reaction speed, functional use of RUE, and Rt attention with average time 1.50 seconds and 1.32 seconds.  Utilized green and red lights with pt to select red with Rt hand and green with Lt with pt reaction time 1.40 on Rt and 1.32 with Lt.  Returned to room as above with supervision and use of rail for support.  Therapy Documentation Precautions:  Precautions Precautions: Fall, Sternal Restrictions Weight Bearing Restrictions: No RUE Weight Bearing:  (sternal precautions) Pain:  Pt with no c/o pain  See Function Navigator for Current Functional Status.   Therapy/Group: Individual Therapy  [name], Kenyotta Dorfman 08/22/2016, 9:27 AM

## 2016-08-22 NOTE — IPOC Note (Signed)
Overall Plan of Care [facility]) Patient Details Name: [name] MRN: [id number] DOB: [birth year]  Admitting Diagnosis: CVA  Hospital Problems: Active Problems:   Coronary artery disease involving coronary bypass graft of native heart without angina pectoris   Benign essential HTN   Morbid obesity (HCC)   Stroke due to embolism (HCC)   Diabetes mellitus (HCC)   Atypical chest pain     Functional Problem List: Nursing Endurance, Motor  PT Balance, Pain, Endurance  OT Balance, Endurance, Motor, Safety, Skin Integrity, Vision  SLP    TR         Basic ADL's: OT Grooming, Bathing, Dressing, Toileting     Advanced  ADL's: OT Simple Meal Preparation, Laundry     Transfers: PT Bed to Chair, Car, Furniture, [employer], Research scientist (life sciences): PT Ambulation     Additional Impairments: OT Fuctional Use of Upper Extremity  SLP None      TR      Anticipated Outcomes Item Anticipated Outcome  Self Feeding Mod I  Swallowing      Basic self-care  Mod I  Toileting  Mod I   Bathroom Transfers Mod I  Bowel/Bladder  Remain continent of bowel & bladder  Transfers  Mod   Locomotion  Mod I home environment  Communication     Cognition  Mod I  Pain  Pain scale less than 3  Safety/Judgment  No falls with injury during admission   Therapy Plan: PT Intensity: Minimum of 1-2 x/day ,45 to 90 minutes PT Frequency: 5 out of 7 days PT Duration Estimated Length of Stay: 10-14 days OT Intensity: Minimum of 1-2 x/day, 45 to 90 minutes OT Frequency: 5 out of 7 days OT Duration/Estimated Length of Stay: 10-12 days SLP Intensity: Minumum of 1-2 x/day, 30 to 90 minutes SLP Frequency: 3 to 5 out of 7 days SLP Duration/Estimated Length of Stay: 10-15 days       Team Interventions: Nursing Interventions Patient/Family Education, Medication Management  PT interventions Ambulation/gait training, Warden/ranger, Discharge planning, Community  reintegration, Disease management/prevention, Fish farm manager, Functional mobility training, Neuromuscular re-education, Patient/family education, Museum/gallery curator, Therapeutic Activities, Therapeutic Exercise  OT Interventions Warden/ranger, Community reintegration, Discharge planning, Disease mangement/prevention, Fish farm manager, Functional mobility training, Neuromuscular re-education, Pain management, Patient/family education, Psychosocial support, Self Care/advanced ADL retraining, Skin care/wound managment, Therapeutic Activities, Therapeutic Exercise, UE/LE Strength taining/ROM, UE/LE Coordination activities, Visual/perceptual remediation/compensation  SLP Interventions Cognitive remediation/compensation, Patient/family education, Medication managment  TR Interventions    SW/CM Interventions Discharge Planning, Psychosocial Support, Patient/Family Education    Team Discharge Planning: Destination: PT-Home ,OT- Home , SLP-Home Projected Follow-up: PT-Home health PT, Outpatient PT, OT-  Outpatient OT, SLP-None Projected Equipment Needs: PT-Rolling walker with 5" wheels, To be determined, OT- 3 in 1 bedside comode, Tub/shower seat, To be determined, SLP-None recommended by SLP Equipment Details: PT- , OT-  Patient/family involved in discharge planning: PT- Patient,  OT-Patient, Family member/caregiver, SLP-Patient, Family member/caregiver  MD ELOS:10-14d Medical Rehab Prognosis:  Good Assessment:  46 y.o.femalewith history of HTN, headaches, T2DM--medication non-comliance due to cost who was admitted on 08/07/16 with severe CP and History taken from patient and chart review.She reports family members available for assistance post-discharge. Cardiac cath done revealing severe multivessel disease. She underwent CABG X 3 with sternal plating. Post op with lethargy and weakness. She was found to have right sided weakness with neglect. 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 CTA head/neck done revaling proximal BA thrombus, BS and L-VA stenosis and remote pontine and cerebellar infarcts. Diabetes coordinator following to help manage uncontrolled BS. Intraoperative TEE without PFO or thrombus. BLE dopplers negative for DVT. Dr. Roda ShuttersXu felt stroke likely embolic due to surgery or multiple stroke risk factors  Now requiring 24/7 Rehab RN,MD, as well as CIR level PT, OT and SLP.  Treatment team will focus on ADLs and mobility with goals set at Mod I  See Team Conference Notes for weekly updates to the plan of care

## 2016-08-22 NOTE — Progress Notes (Signed)
Physical Therapy Session Note  Patient Details  Name: [name] MRN: [id number] Date of Birth: Jun 08, 1971  Today's Date: 08/22/2016 PT Individual Time: 1445-1600 PT Individual Time Calculation (min): 75 min   Short Term Goals: Week 1:  PT Short Term Goal 1 (Week 1): Patient will performed ambulatory transfer with least restricitive assisitve device supervision  PT Short Term Goal 2 (Week 1): Patient will ambulate with least restricitive assisitve device  100 feet supervision  PT Short Term Goal 3 (Week 1): Patient will negotiaite 4 steps with min assist with handrail  PT Short Term Goal 4 (Week 1): Patient will be able to identify sternal cautions without cues.  PT Short Term Goal 5 (Week 1): Patient will be able perform bed mobility with close supervision   Skilled Therapeutic Interventions/Progress Updates:    no c/o pain today, reports improved nausea as well.  Session focus on static and dynamic balance assessment, pt education, ambulation, transfers, activity tolerance, and stair negotiation.    Pt ambulated throughout unit, max distance 215', with overall supervision.  Noted improvement in balance and gait pattern with pt self selected quicker gait speed.  PT educated on trying to use increased gait speed for improved balance when ambulating with therapy and nursing.    PT administered Berg Balance Scale and pt demonstrates moderate fall risk as noted by score of 49/56 on Berg Balance Scale.   Interpreted results for pt and made recommendations for how to incorporate balance exercises into plan of care to improve score.  PT also administered DGI for better interpretation of balance during gait and pt scored 16/24.  Again, PT explained results and how to improve score and self monitor for areas of challenge at home.    Nustep x10 minutes with LEs only at level 4 for cardiopulmonary endurance and LE strengthening.  Stair negotiation 2x4 steps with 1-2 rails alternating pattern  with supervision.  PT also instructed pt in x1 viewing for horizontal and vertical head turns to reduce dizziness with head turns in walking.  Pt performed x30 seconds in each directions and PT instructed in performing daily to improve balance and reduce dizziness.   Pt returned to room at end of session and positioned in bed with husband present, call bell in reach and needs met.    Therapy Documentation Precautions:  Precautions Precautions: Fall, Sternal Restrictions Weight Bearing Restrictions: Yes (sternal precaution) RUE Weight Bearing:  (sternal precautions)  Balance: Balance Balance Assessed: Yes Standardized Balance Assessment Standardized Balance Assessment: Berg Balance Test;Dynamic Gait Index Berg Balance Test Sit to Stand: Able to stand without using hands and stabilize independently Standing Unsupported: Able to stand safely 2 minutes Sitting with Back Unsupported but Feet Supported on Floor or Stool: Able to sit safely and securely 2 minutes Stand to Sit: Sits safely with minimal use of hands Transfers: Able to transfer safely, minor use of hands Standing Unsupported with Eyes Closed: Able to stand 10 seconds safely Standing Ubsupported with Feet Together: Able to place feet together independently and stand for 1 minute with supervision From Standing, Reach Forward with Outstretched Arm: Can reach confidently >25 cm (10") From Standing Position, Pick up Object from Floor: Able to pick up shoe safely and easily From Standing Position, Turn to Look Behind Over each Shoulder: Turn sideways only but maintains balance Turn 360 Degrees: Able to turn 360 degrees safely in 4 seconds or less Standing Unsupported, Alternately Place Feet on Step/Stool: Able to stand independently and complete 8 steps >20 seconds  Standing Unsupported, One Foot in Front: Able to place foot tandem independently and hold 30 seconds Standing on One Leg: Tries to lift leg/unable to hold 3 seconds but  remains standing independently Total Score: 49 Dynamic Gait Index Level Surface: Mild Impairment (normal when not fatigued) Change in Gait Speed: Normal Gait with Horizontal Head Turns: Moderate Impairment Gait with Vertical Head Turns: Mild Impairment Gait and Pivot Turn: Mild Impairment Step Over Obstacle: Mild Impairment Step Around Obstacles: Mild Impairment Steps: Mild Impairment Total Score: 16   See Function Navigator for Current Functional Status.   Therapy/Group: Individual Therapy  Earnest Conroy Penven-Crew 08/22/2016, 4:40 PM

## 2016-08-22 NOTE — Progress Notes (Signed)
Subjective/Complaints: Pt laying in bed this AM.  She states she slept well overnight after receiving her sleep aid.    ROS- Denies CP, SOB, N/V/D  Objective: Vital Signs: Blood pressure (!) 151/85, pulse 90, temperature 98.4 F (36.9 C), temperature source Oral, resp. rate 18, height '4\' 11"'  (1.499 m), weight 81.3 kg (179 lb 3.2 oz), SpO2 98 %. No results found. Results for orders placed or performed during the hospital encounter of 08/19/16 (from the past 72 hour(s))  Glucose, capillary     Status: Abnormal   Collection Time: 08/19/16  8:35 PM  Result Value Ref Range   Glucose-Capillary 132 (H) 65 - 99 mg/dL  CBC WITH DIFFERENTIAL     Status: Abnormal   Collection Time: 08/20/16  4:21 AM  Result Value Ref Range   WBC 12.4 (H) 4.0 - 10.5 K/uL   RBC 3.43 (L) 3.87 - 5.11 MIL/uL   Hemoglobin 10.1 (L) 12.0 - 15.0 g/dL   HCT 31.1 (L) 36.0 - 46.0 %   MCV 90.7 78.0 - 100.0 fL   MCH 29.4 26.0 - 34.0 pg   MCHC 32.5 30.0 - 36.0 g/dL   RDW 14.1 11.5 - 15.5 %   Platelets 670 (H) 150 - 400 K/uL   Neutrophils Relative % 65 %   Neutro Abs 8.0 (H) 1.7 - 7.7 K/uL   Lymphocytes Relative 25 %   Lymphs Abs 3.2 0.7 - 4.0 K/uL   Monocytes Relative 5 %   Monocytes Absolute 0.6 0.1 - 1.0 K/uL   Eosinophils Relative 5 %   Eosinophils Absolute 0.6 0.0 - 0.7 K/uL   Basophils Relative 0 %   Basophils Absolute 0.0 0.0 - 0.1 K/uL  Comprehensive metabolic panel     Status: Abnormal   Collection Time: 08/20/16  4:21 AM  Result Value Ref Range   Sodium 140 135 - 145 mmol/L   Potassium 3.5 3.5 - 5.1 mmol/L   Chloride 105 101 - 111 mmol/L   CO2 28 22 - 32 mmol/L   Glucose, Bld 81 65 - 99 mg/dL   BUN 14 6 - 20 mg/dL   Creatinine, Ser 0.58 0.44 - 1.00 mg/dL   Calcium 8.9 8.9 - 10.3 mg/dL   Total Protein 6.4 (L) 6.5 - 8.1 g/dL   Albumin 2.3 (L) 3.5 - 5.0 g/dL   AST 25 15 - 41 U/L   ALT 28 14 - 54 U/L   Alkaline Phosphatase 78 38 - 126 U/L   Total Bilirubin 0.5 0.3 - 1.2 mg/dL   GFR calc non Af  Amer >60 >60 mL/min   GFR calc Af Amer >60 >60 mL/min    Comment: (NOTE) The eGFR has been calculated using the CKD EPI equation. This calculation has not been validated in all clinical situations. eGFR's persistently <60 mL/min signify possible Chronic Kidney Disease.    Anion gap 7 5 - 15  Glucose, capillary     Status: None   Collection Time: 08/20/16  6:28 AM  Result Value Ref Range   Glucose-Capillary 84 65 - 99 mg/dL  Glucose, capillary     Status: Abnormal   Collection Time: 08/20/16  9:37 AM  Result Value Ref Range   Glucose-Capillary 182 (H) 65 - 99 mg/dL   Comment 1 Notify RN   Glucose, capillary     Status: Abnormal   Collection Time: 08/20/16 12:06 PM  Result Value Ref Range   Glucose-Capillary 149 (H) 65 - 99 mg/dL   Comment 1 Notify  RN   Glucose, capillary     Status: Abnormal   Collection Time: 08/20/16  5:15 PM  Result Value Ref Range   Glucose-Capillary 158 (H) 65 - 99 mg/dL   Comment 1 Notify RN   Glucose, capillary     Status: Abnormal   Collection Time: 08/20/16  8:55 PM  Result Value Ref Range   Glucose-Capillary 177 (H) 65 - 99 mg/dL  Glucose, capillary     Status: None   Collection Time: 08/21/16  6:55 AM  Result Value Ref Range   Glucose-Capillary 85 65 - 99 mg/dL  Glucose, capillary     Status: Abnormal   Collection Time: 08/21/16 10:21 AM  Result Value Ref Range   Glucose-Capillary 226 (H) 65 - 99 mg/dL  Glucose, capillary     Status: Abnormal   Collection Time: 08/21/16 11:54 AM  Result Value Ref Range   Glucose-Capillary 216 (H) 65 - 99 mg/dL   Comment 1 Notify RN   Urinalysis, Routine w reflex microscopic     Status: Abnormal   Collection Time: 08/21/16  2:39 PM  Result Value Ref Range   Color, Urine YELLOW YELLOW   APPearance HAZY (A) CLEAR   Specific Gravity, Urine 1.016 1.005 - 1.030   pH 5.0 5.0 - 8.0   Glucose, UA NEGATIVE NEGATIVE mg/dL   Hgb urine dipstick NEGATIVE NEGATIVE   Bilirubin Urine NEGATIVE NEGATIVE   Ketones, ur  NEGATIVE NEGATIVE mg/dL   Protein, ur NEGATIVE NEGATIVE mg/dL   Nitrite NEGATIVE NEGATIVE   Leukocytes, UA NEGATIVE NEGATIVE  Culture, Urine     Status: Abnormal   Collection Time: 08/21/16  2:39 PM  Result Value Ref Range   Specimen Description URINE, CLEAN CATCH    Special Requests NONE    Culture MULTIPLE SPECIES PRESENT, SUGGEST RECOLLECTION (A)    Report Status 08/22/2016 FINAL   Glucose, capillary     Status: None   Collection Time: 08/21/16  5:02 PM  Result Value Ref Range   Glucose-Capillary 87 65 - 99 mg/dL   Comment 1 Notify RN   Glucose, capillary     Status: Abnormal   Collection Time: 08/21/16  8:30 PM  Result Value Ref Range   Glucose-Capillary 214 (H) 65 - 99 mg/dL  Basic metabolic panel     Status: Abnormal   Collection Time: 08/22/16  4:56 AM  Result Value Ref Range   Sodium 139 135 - 145 mmol/L   Potassium 3.6 3.5 - 5.1 mmol/L   Chloride 107 101 - 111 mmol/L   CO2 27 22 - 32 mmol/L   Glucose, Bld 122 (H) 65 - 99 mg/dL   BUN 16 6 - 20 mg/dL   Creatinine, Ser 0.59 0.44 - 1.00 mg/dL   Calcium 8.7 (L) 8.9 - 10.3 mg/dL   GFR calc non Af Amer >60 >60 mL/min   GFR calc Af Amer >60 >60 mL/min    Comment: (NOTE) The eGFR has been calculated using the CKD EPI equation. This calculation has not been validated in all clinical situations. eGFR's persistently <60 mL/min signify possible Chronic Kidney Disease.    Anion gap 5 5 - 15  Glucose, capillary     Status: Abnormal   Collection Time: 08/22/16  6:28 AM  Result Value Ref Range   Glucose-Capillary 120 (H) 65 - 99 mg/dL     HEENT: Normocephalic, atraumatic Cardio: RRR. No JVD. Resp: CTA B/L and Unlabored GI: BS positive and ND Skin:   Wound c/d/i midline sternotomy,  drain sites with sutures Neuro: Alert. Motor: LLE: HF 4/5, KE 4/5, ADF/PF 4-/5 LUE: 4+/5 proximal to distal RLE: HF 4/5, KE 4/5, ADF/PF 4/5 RUE: 4+/5 proximal to distal  Musc/Skel:  No edema, no tenderness Gen NAD. Vital signs reviewed.     Assessment/Plan: 1. 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 Physiatrist is providing close team supervision and 24 hour management of active medical problems listed below. Physiatrist and rehab team continue to assess barriers to discharge/monitor patient progress toward functional and medical goals. FIM: Function - Bathing Position: Shower Body parts bathed by patient: Right arm, Left arm, Chest, Abdomen, Front perineal area, Buttocks, Right upper leg, Left upper leg, Right lower leg, Left lower leg Body parts bathed by helper: Back Bathing not applicable: Front perineal area, Buttocks, Right upper leg, Left upper leg, Right lower leg, Left lower leg Assist Level: Touching or steadying assistance(Pt > 75%)  Function- Upper Body Dressing/Undressing What is the patient wearing?: Pull over shirt/dress Pull over shirt/dress - Perfomed by patient: Thread/unthread right sleeve, Thread/unthread left sleeve, Put head through opening, Pull shirt over trunk Assist Level: Supervision or verbal cues Function - Lower Body Dressing/Undressing What is the patient wearing?: Underwear, Pants, Socks, Shoes (per report from pt) Position: Other (comment) (standing in bathroom) Underwear - Performed by patient: Thread/unthread right underwear leg, Thread/unthread left underwear leg, Pull underwear up/down Pants- Performed by patient: Thread/unthread right pants leg, Thread/unthread left pants leg, Pull pants up/down Pants- Performed by helper: Thread/unthread right pants leg Non-skid slipper socks- Performed by helper: Don/doff right sock, Don/doff left sock Socks - Performed by patient: Don/doff right sock, Don/doff left sock Shoes - Performed by patient: Don/doff right shoe, Don/doff left shoe Assist for lower body dressing: Touching or steadying assistance (Pt > 75%)  Function -  Toileting Toileting steps completed by patient: Adjust clothing prior to toileting, Performs perineal hygiene, Adjust clothing after toileting Toileting Assistive Devices: Grab bar or rail Assist level: Supervision or verbal cues  Function Midwife transfer assistive device: Grab bar Assist level to toilet: Supervision or verbal cues Assist level from toilet: Supervision or verbal cues  Function - Chair/bed transfer Chair/bed transfer method: Ambulatory Chair/bed transfer assist level: Touching or steadying assistance (Pt > 75%) Chair/bed transfer assistive device: Armrests Chair/bed transfer details: Verbal cues for precautions/safety  Function - Locomotion: Wheelchair Will patient use wheelchair at discharge?: No Type: Manual Max wheelchair distance: 100 Assist Level: Supervision or verbal cues Wheel 50 feet with 2 turns activity did not occur: Safety/medical concerns (decreased endurance) Assist Level: Supervision or verbal cues Wheel 150 feet activity did not occur: Safety/medical concerns Turns around,maneuvers to table,bed, and toilet,negotiates 3% grade,maneuvers on rugs and over doorsills: No Function - Locomotion: Ambulation Ambulation activity did not occur: Safety/medical concerns (Patient with decreased BP, elvevated HR, RR, diaphoretic, nausea and chest pain deferred at this time.) Assistive device: Hand held assist Max distance: 300 Assist level: Touching or steadying assistance (Pt > 75%) Walk 10 feet activity did not occur: Safety/medical concerns Assist level: Touching or steadying assistance (Pt > 75%) Walk 50 feet with 2 turns activity did not occur: Safety/medical concerns Assist level: Touching or steadying assistance (Pt > 75%) Walk 150 feet activity did not occur: Safety/medical concerns Assist level: Touching or steadying assistance (Pt > 75%) Walk 10 feet on uneven surfaces activity did not occur: Safety/medical concerns Assist level:  Touching or steadying  assistance (Pt > 75%)  Function - Comprehension Comprehension: Auditory Comprehension assist level: Follows complex conversation/direction with extra time/assistive device  Function - Expression Expression: Verbal Expression assist level: Expresses basic 90% of the time/requires cueing < 10% of the time.  Function - Social Interaction Social Interaction assist level: Interacts appropriately 90% of the time - Needs monitoring or encouragement for participation or interaction.  Function - Problem Solving Problem solving assist level: Solves basic 90% of the time/requires cueing < 10% of the time  Function - Memory Memory assist level: Recognizes or recalls 90% of the time/requires cueing < 10% of the time Patient normally able to recall (first 3 days only): Current season, Location of own room, Staff names and faces, That he or she is in a hospital  Medical Problem List and Plan: 1.  Weakness, poor activity tolerance, abnormality of gait secondary to multiple small vessel infarcts.  Cont CIR 2.  DVT Prophylaxis/Anticoagulation: Pharmaceutical: Lovenox 3. Pain Management: continue tramadol prn for pain.  4. Mood: LCSW to follow for evaluation and support.  5. Neuropsych: This patient is capable of making decisions on her own behalf. 6. Skin/Wound Care: Routine pressure relief measures 7. Fluids/Electrolytes/Nutrition: Monitor I/O.  BMP within acceptable range on 1/25 8. CAD s/p CABG: On ASA, plavix, lipitor and BB 9. HTN:  Monitor BP bid. On lasix, metoprolol and Prinivil--dose increased for tighter control.   Appears to be improving  Cont to monitor Vitals:   08/21/16 1445 08/22/16 0459  BP: 133/73 (!) 151/85  Pulse: 87 90  Resp: 18 18  Temp: 98.8 F (37.1 C) 98.4 F (36.9 C)   10.T2DM: Monitor BS ac/hs and use SSI for elevated BS. Reports that she can not afford lantus due to cost and was using NPH at home.   Labile at present, will cont to monitor and  consider further changes based on trend CBG (last 3)   Recent Labs  08/21/16 1702 08/21/16 2030 08/22/16 0628  GLUCAP 87 214* 120*    11. Leucocytosis: Monitor for signs of infection. Has completed 7 day course of antibiotic therapy  WBC 12.4 on 1/23 12. Volume overload: Check weights daily. Continue lasix and monitor for signs of overload.  Filed Weights   08/20/16 0558 08/21/16 0431 08/22/16 0459  Weight: 78 kg (171 lb 15.3 oz) 80.1 kg (176 lb 9.6 oz) 81.3 kg (179 lb 3.2 oz)   13. ABLA: Hgb 10.1 1/23 14. Morbid obesity: Encourage weight loss 15. Hypoalbuminemia. Added pro-stat  LOS (Days) 3 A FACE TO FACE EVALUATION WAS PERFORMED  Meko Masterson Lorie Phenix 08/22/2016, 10:57 AM

## 2016-08-23 ENCOUNTER — Inpatient Hospital Stay (HOSPITAL_COMMUNITY): Payer: BLUE CROSS/BLUE SHIELD | Admitting: Speech Pathology

## 2016-08-23 ENCOUNTER — Inpatient Hospital Stay (HOSPITAL_COMMUNITY): Payer: BLUE CROSS/BLUE SHIELD | Admitting: Occupational Therapy

## 2016-08-23 ENCOUNTER — Inpatient Hospital Stay (HOSPITAL_COMMUNITY): Payer: BLUE CROSS/BLUE SHIELD | Admitting: Physical Therapy

## 2016-08-23 ENCOUNTER — Inpatient Hospital Stay (HOSPITAL_COMMUNITY): Payer: BLUE CROSS/BLUE SHIELD

## 2016-08-23 LAB — GLUCOSE, CAPILLARY
GLUCOSE-CAPILLARY: 157 mg/dL — AB (ref 65–99)
Glucose-Capillary: 137 mg/dL — ABNORMAL HIGH (ref 65–99)

## 2016-08-23 MED ORDER — BLOOD GLUCOSE MONITOR KIT: PACK | 0 refills | Status: DC

## 2016-08-23 MED ORDER — INSULIN DETEMIR 100 UNIT/ML ~~LOC~~ SOLN: 30.0000 [IU] | Freq: Every day | SUBCUTANEOUS | 0 refills | Status: DC

## 2016-08-23 MED ORDER — INSULIN GLARGINE 100 UNITS/ML SOLOSTAR PEN: 30.0000 [IU] | PEN_INJECTOR | Freq: Every day | SUBCUTANEOUS | 0 refills | Status: DC

## 2016-08-23 MED ORDER — ATORVASTATIN CALCIUM 80 MG PO TABS: 80.0000 mg | ORAL_TABLET | Freq: Every day | ORAL | 0 refills | Status: AC

## 2016-08-23 MED ORDER — TRAMADOL HCL 50 MG PO TABS: 50.0000 mg | ORAL_TABLET | Freq: Two times a day (BID) | ORAL | 0 refills | Status: DC | PRN

## 2016-08-23 MED ORDER — CLOPIDOGREL BISULFATE 75 MG PO TABS: 75.0000 mg | ORAL_TABLET | Freq: Every day | ORAL | 0 refills | Status: DC

## 2016-08-23 MED ORDER — INSULIN ASPART 100 UNIT/ML ~~LOC~~ SOLN: 6.0000 [IU] | Freq: Two times a day (BID) | SUBCUTANEOUS | Status: DC

## 2016-08-23 MED ORDER — INSULIN DETEMIR 100 UNIT/ML FLEXPEN: 30.0000 [IU] | PEN_INJECTOR | Freq: Every day | SUBCUTANEOUS | 0 refills | Status: DC

## 2016-08-23 MED ORDER — POTASSIUM CHLORIDE CRYS ER 10 MEQ PO TBCR: 10.0000 meq | EXTENDED_RELEASE_TABLET | Freq: Every day | ORAL | 0 refills | Status: DC

## 2016-08-23 MED ORDER — NITROGLYCERIN 0.4 MG SL SUBL: 0.4000 mg | SUBLINGUAL_TABLET | SUBLINGUAL | 0 refills | Status: AC | PRN

## 2016-08-23 MED ORDER — LISINOPRIL 5 MG PO TABS: 5.0000 mg | ORAL_TABLET | Freq: Every day | ORAL | 0 refills | Status: DC

## 2016-08-23 MED ORDER — FUROSEMIDE 40 MG PO TABS: 40.0000 mg | ORAL_TABLET | Freq: Every day | ORAL | 0 refills | Status: DC

## 2016-08-23 MED ORDER — METOPROLOL TARTRATE 25 MG PO TABS: 25.0000 mg | ORAL_TABLET | Freq: Two times a day (BID) | ORAL | 0 refills | Status: DC

## 2016-08-23 MED ORDER — PANTOPRAZOLE SODIUM 40 MG PO TBEC: 40.0000 mg | DELAYED_RELEASE_TABLET | Freq: Every day | ORAL | 0 refills | Status: DC

## 2016-08-23 MED ORDER — SENNOSIDES-DOCUSATE SODIUM 8.6-50 MG PO TABS: 1.0000 | ORAL_TABLET | Freq: Every day | ORAL | 0 refills | Status: DC

## 2016-08-23 MED ORDER — INSULIN PEN NEEDLE 31G X 8 MM MISC: 1.0000 "application " | 0 refills | Status: DC

## 2016-08-23 MED ORDER — LIDOCAINE 5 % EX PTCH: MEDICATED_PATCH | CUTANEOUS | 0 refills | Status: DC

## 2016-08-23 NOTE — Progress Notes (Signed)
Occupational Therapy Discharge Summary  Patient Details  Name: [name] MRN: [id number] Date of Birth: August 14, 1970  Patient has met 12 of 12 long term goals due to improved activity tolerance, improved balance, ability to compensate for deficits, functional use of  RIGHT upper and RIGHT lower extremity, improved awareness and improved coordination.  Patient to discharge at overall Modified Independent level.  Patient's care partner is independent to provide the necessary intermittent assistance at discharge.    Reasons goals not met: NA  Recommendation:  Patient will benefit from ongoing skilled OT services in outpatient setting to continue to advance functional skills in the area of BADL, iADL and Reduce care partner burden.  Equipment: No equipment provided  Reasons for discharge: treatment goals met and discharge from hospital  Patient/family agrees with progress made and goals achieved: Yes  OT Discharge Precautions/Restrictions  Precautions Precautions: Fall;Sternal Restrictions Weight Bearing Restrictions: No Pain Pain Assessment Pain Assessment: No/denies pain ADL  See Function Navigator Vision/Perception  Vision- History Baseline Vision/History: Wears glasses Patient Visual Report: No change from baseline Vision- Assessment Vision Assessment?: Yes Eye Alignment: Within Functional Limits Tracking/Visual Pursuits: Decreased smoothness of horizontal tracking;Decreased smoothness of vertical tracking;Requires cues, head turns, or add eye shifts to track Additional Comments: Pt wears glasses. 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  No further reports of diplopia or dizziness  Cognition Overall Cognitive Status: Within Functional Limits for tasks assessed Arousal/Alertness: Awake/alert Orientation Level: Oriented X4 Attention: Alternating Alternating Attention: Appears intact Memory: Appears intact Awareness: Appears intact Problem Solving:  Appears intact Safety/Judgment: Appears intact Sensation Sensation Light Touch: Appears Intact Stereognosis: Not tested Hot/Cold: Not tested Proprioception: Appears Intact Coordination Fine Motor Movements are Fluid and Coordinated: Yes Finger Nose Finger Test: Mild dysmetria on Rt with some overshooting 9 Hole Peg Test: Rt: 40 seconds (noted some overshooting), Lt: 30 seconds on eval.  Rt: 32 seconds and Lt: 28 seconds (on 1/26) Extremity/Trunk Assessment RUE Assessment RUE Assessment: Within Functional Limits (strength grossly 4/5, reports mild numbness in hands but overall WFL) LUE Assessment LUE Assessment: Within Functional Limits   See Function Navigator for Current Functional Status.  [name] 08/23/2016, 9:41 AM

## 2016-08-23 NOTE — Progress Notes (Signed)
Subjective/Complaints: Pt states she had "gas " yesterday evening that was relieved by repositioning her body to relieve it.  No further issues, refused some night meds, had some conflict with pm RN  Discussed home set up  ROS- Denies CP, SOB, N/V/D  Objective: Vital Signs: Blood pressure (!) 142/77, pulse 82, temperature 98.3 F (36.8 C), temperature source Oral, resp. rate 20, height '4\' 11"'  (1.499 m), weight 77.1 kg (170 lb), SpO2 100 %. No results found. Results for orders placed or performed during the hospital encounter of 08/19/16 (from the past 72 hour(s))  Glucose, capillary     Status: Abnormal   Collection Time: 08/20/16  9:37 AM  Result Value Ref Range   Glucose-Capillary 182 (H) 65 - 99 mg/dL   Comment 1 Notify RN   Glucose, capillary     Status: Abnormal   Collection Time: 08/20/16 12:06 PM  Result Value Ref Range   Glucose-Capillary 149 (H) 65 - 99 mg/dL   Comment 1 Notify RN   Glucose, capillary     Status: Abnormal   Collection Time: 08/20/16  5:15 PM  Result Value Ref Range   Glucose-Capillary 158 (H) 65 - 99 mg/dL   Comment 1 Notify RN   Glucose, capillary     Status: Abnormal   Collection Time: 08/20/16  8:55 PM  Result Value Ref Range   Glucose-Capillary 177 (H) 65 - 99 mg/dL  Glucose, capillary     Status: None   Collection Time: 08/21/16  6:55 AM  Result Value Ref Range   Glucose-Capillary 85 65 - 99 mg/dL  Glucose, capillary     Status: Abnormal   Collection Time: 08/21/16 10:21 AM  Result Value Ref Range   Glucose-Capillary 226 (H) 65 - 99 mg/dL  Glucose, capillary     Status: Abnormal   Collection Time: 08/21/16 11:54 AM  Result Value Ref Range   Glucose-Capillary 216 (H) 65 - 99 mg/dL   Comment 1 Notify RN   Urinalysis, Routine w reflex microscopic     Status: Abnormal   Collection Time: 08/21/16  2:39 PM  Result Value Ref Range   Color, Urine YELLOW YELLOW   APPearance HAZY (A) CLEAR   Specific Gravity, Urine 1.016 1.005 - 1.030   pH  5.0 5.0 - 8.0   Glucose, UA NEGATIVE NEGATIVE mg/dL   Hgb urine dipstick NEGATIVE NEGATIVE   Bilirubin Urine NEGATIVE NEGATIVE   Ketones, ur NEGATIVE NEGATIVE mg/dL   Protein, ur NEGATIVE NEGATIVE mg/dL   Nitrite NEGATIVE NEGATIVE   Leukocytes, UA NEGATIVE NEGATIVE  Culture, Urine     Status: Abnormal   Collection Time: 08/21/16  2:39 PM  Result Value Ref Range   Specimen Description URINE, CLEAN CATCH    Special Requests NONE    Culture MULTIPLE SPECIES PRESENT, SUGGEST RECOLLECTION (A)    Report Status 08/22/2016 FINAL   Glucose, capillary     Status: None   Collection Time: 08/21/16  5:02 PM  Result Value Ref Range   Glucose-Capillary 87 65 - 99 mg/dL   Comment 1 Notify RN   Glucose, capillary     Status: Abnormal   Collection Time: 08/21/16  8:30 PM  Result Value Ref Range   Glucose-Capillary 214 (H) 65 - 99 mg/dL  Basic metabolic panel     Status: Abnormal   Collection Time: 08/22/16  4:56 AM  Result Value Ref Range   Sodium 139 135 - 145 mmol/L   Potassium 3.6 3.5 - 5.1 mmol/L  Chloride 107 101 - 111 mmol/L   CO2 27 22 - 32 mmol/L   Glucose, Bld 122 (H) 65 - 99 mg/dL   BUN 16 6 - 20 mg/dL   Creatinine, Ser 0.59 0.44 - 1.00 mg/dL   Calcium 8.7 (L) 8.9 - 10.3 mg/dL   GFR calc non Af Amer >60 >60 mL/min   GFR calc Af Amer >60 >60 mL/min    Comment: (NOTE) The eGFR has been calculated using the CKD EPI equation. This calculation has not been validated in all clinical situations. eGFR's persistently <60 mL/min signify possible Chronic Kidney Disease.    Anion gap 5 5 - 15  Glucose, capillary     Status: Abnormal   Collection Time: 08/22/16  6:28 AM  Result Value Ref Range   Glucose-Capillary 120 (H) 65 - 99 mg/dL  Glucose, capillary     Status: Abnormal   Collection Time: 08/22/16 11:36 AM  Result Value Ref Range   Glucose-Capillary 264 (H) 65 - 99 mg/dL  Glucose, capillary     Status: None   Collection Time: 08/22/16  4:44 PM  Result Value Ref Range    Glucose-Capillary 66 65 - 99 mg/dL  Glucose, capillary     Status: Abnormal   Collection Time: 08/22/16  5:23 PM  Result Value Ref Range   Glucose-Capillary 116 (H) 65 - 99 mg/dL  Glucose, capillary     Status: Abnormal   Collection Time: 08/22/16  8:28 PM  Result Value Ref Range   Glucose-Capillary 169 (H) 65 - 99 mg/dL  Glucose, capillary     Status: Abnormal   Collection Time: 08/23/16  6:22 AM  Result Value Ref Range   Glucose-Capillary 137 (H) 65 - 99 mg/dL     HEENT: Normocephalic, atraumatic Cardio: RRR. No JVD. Resp: CTA B/L and Unlabored GI: BS positive and ND Skin:   Wound c/d/i midline sternotomy, drain sites with sutures Neuro: Alert. Motor: LLE: HF 4/5, KE 4/5, ADF/PF 4-/5 LUE: 4+/5 proximal to distal RLE: HF 4/5, KE 4/5, ADF/PF 4/5 RUE: 4+/5 proximal to distal  Musc/Skel:  No edema, no tenderness Gen NAD. Vital signs reviewed.    Assessment/Plan: 1. 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  Stable for D/C today F/u PCP in 3-4 weeks F/u PM&R 2 weeks See D/C summary See D/C instructions FIM: Function - Bathing Position: Shower Body parts bathed by patient: Right arm, Left arm, Chest, Abdomen, Front perineal area, Buttocks, Right upper leg, Left upper leg, Right lower leg, Left lower leg Body parts bathed by helper: Back Bathing not applicable: Front perineal area, Buttocks, Right upper leg, Left upper leg, Right lower leg, Left lower leg Assist Level: Touching or steadying assistance(Pt > 75%)  Function- Upper Body Dressing/Undressing What is the patient wearing?: Pull over shirt/dress Pull over shirt/dress - Perfomed by patient: Thread/unthread right sleeve, Thread/unthread left sleeve, Put head through opening, Pull shirt over trunk Assist Level: Supervision or verbal cues Function - Lower Body Dressing/Undressing What is the patient wearing?: Underwear, Pants, Socks, Shoes (per report from pt) Position: Other (comment) (standing in  bathroom) Underwear - Performed by patient: Thread/unthread right underwear leg, Thread/unthread left underwear leg, Pull underwear up/down Pants- Performed by patient: Thread/unthread right pants leg, Thread/unthread left pants leg, Pull pants up/down Pants- Performed by helper: Thread/unthread right pants leg Non-skid slipper socks- Performed by helper: Don/doff right sock, Don/doff left sock Socks - Performed by patient: Don/doff right sock, Don/doff left sock Socks - Performed by helper: Don/doff  right sock, Don/doff left sock Shoes - Performed by patient: Don/doff right shoe, Don/doff left shoe Assist for footwear: Dependant (per nursing) Assist for lower body dressing: Touching or steadying assistance (Pt > 75%)  Function - Toileting Toileting steps completed by patient: Adjust clothing prior to toileting, Performs perineal hygiene, Adjust clothing after toileting Toileting steps completed by helper: Adjust clothing after toileting (per Leilani Able, NT) Toileting Assistive Devices: Grab bar or rail Assist level: Supervision or verbal cues  Function - Air cabin crew transfer assistive device: Grab bar Assist level to toilet: Supervision or verbal cues Assist level from toilet: Supervision or verbal cues  Function - Chair/bed transfer Chair/bed transfer method: Ambulatory Chair/bed transfer assist level: Supervision or verbal cues Chair/bed transfer assistive device: Armrests Chair/bed transfer details: Verbal cues for precautions/safety  Function - Locomotion: Wheelchair Will patient use wheelchair at discharge?: No Type: Manual Max wheelchair distance: 100 Assist Level: Supervision or verbal cues Wheel 50 feet with 2 turns activity did not occur: Safety/medical concerns (decreased endurance) Assist Level: Supervision or verbal cues Wheel 150 feet activity did not occur: Safety/medical concerns Turns around,maneuvers to table,bed, and toilet,negotiates 3%  grade,maneuvers on rugs and over doorsills: No Function - Locomotion: Ambulation Ambulation activity did not occur: Safety/medical concerns (Patient with decreased BP, elvevated HR, RR, diaphoretic, nausea and chest pain deferred at this time.) Assistive device: No device Max distance: 215 Assist level: Supervision or verbal cues Walk 10 feet activity did not occur: Safety/medical concerns Assist level: Supervision or verbal cues Walk 50 feet with 2 turns activity did not occur: Safety/medical concerns Assist level: Supervision or verbal cues Walk 150 feet activity did not occur: Safety/medical concerns Assist level: Supervision or verbal cues Walk 10 feet on uneven surfaces activity did not occur: Safety/medical concerns Assist level: Touching or steadying assistance (Pt > 75%)  Function - Comprehension Comprehension: Auditory Comprehension assist level: Follows complex conversation/direction with extra time/assistive device  Function - Expression Expression: Verbal Expression assist level: Expresses basic 90% of the time/requires cueing < 10% of the time.  Function - Social Interaction Social Interaction assist level: Interacts appropriately 90% of the time - Needs monitoring or encouragement for participation or interaction.  Function - Problem Solving Problem solving assist level: Solves basic 90% of the time/requires cueing < 10% of the time  Function - Memory Memory assist level: Recognizes or recalls 90% of the time/requires cueing < 10% of the time Patient normally able to recall (first 3 days only): Current season, Location of own room, Staff names and faces, That he or she is in a hospital  Medical Problem List and Plan: 1.  Weakness, poor activity tolerance, abnormality of gait secondary to multiple small vessel infarcts.  Stable for d/c after therapy and PICC removal 2.  DVT Prophylaxis/Anticoagulation: Pharmaceutical: Lovenox 3. Pain Management: continue tramadol prn  for pain. Non cardiac CP, incisional +/- GI, has been recently evaluated by CVTS and cardiology 4. Mood: LCSW to follow for evaluation and support.  5. Neuropsych: This patient is capable of making decisions on her own behalf. 6. Skin/Wound Care: Routine pressure relief measures 7. Fluids/Electrolytes/Nutrition: Monitor I/O.  BMP within acceptable range on 1/25 8. CAD s/p CABG: On ASA, plavix, lipitor and BB 9. HTN:  Monitor BP bid. On lasix, metoprolol and Prinivil--dose increased for tighter control.   Appears to be improving  Cont to monitor Vitals:   08/22/16 2147 08/23/16 0419  BP:  (!) 142/77  Pulse: 82 82  Resp:  20  Temp:  98.3 F (36.8 C)   10.T2DM: Monitor BS ac/hs and use SSI for elevated BS. Reports that she can not afford lantus due to cost and was using NPH at home.   Controlled  CBG (last 3)   Recent Labs  08/22/16 1723 08/22/16 2028 08/23/16 0622  GLUCAP 116* 169* 137*    11. Leucocytosis: Monitor for signs of infection. Has completed 7 day course of antibiotic therapy  WBC 12.4 on 1/23 12. Volume overload: Check weights daily. Continue lasix and monitor for signs of overload.  Filed Weights   08/21/16 0431 08/22/16 0459 08/23/16 0419  Weight: 80.1 kg (176 lb 9.6 oz) 81.3 kg (179 lb 3.2 oz) 77.1 kg (170 lb)   13. ABLA: Hgb 10.1 1/23 14. Morbid obesity: Encourage weight loss 15. Hypoalbuminemia. Added pro-stat  LOS (Days) 4 A FACE TO FACE EVALUATION WAS PERFORMED  KIRSTEINS,ANDREW E 08/23/2016, 9:13 AM

## 2016-08-23 NOTE — Progress Notes (Signed)
Pt puts on call light and c/o "sharp pain in chest".  Upon arrival in room, pt is lying in bed quietly, states she will accept treatment now because she doesn't want to "be a problem".  States pain suddenly occurred to the Left side of her chest near her side and lower rib area, able to take deep breaths without difficulty.  VSS, Pt states it is not "chest pain" as in "heart" pain, that this feels different.  No SOB, Resp even and unlabored.  Pt helped up to bathroom, passes gas, returns to bed and states relief, feels the pain was gas "bubbles" pushing up, k-pad applied to abdomen and pt takes sleeping pill to get some rest, feeling better.

## 2016-08-23 NOTE — Progress Notes (Signed)
Physical Therapy Discharge Summary  Patient Details  Name: [name] MRN: [id number] Date of Birth: Aug 02, 1970  Today's Date: 08/23/2016 PT Individual Time: 1030-1110 PT Individual Time Calculation (min): 40 min    Patient has met 9 of 9 long term goals due to improved activity tolerance, improved balance, improved postural control, increased strength, increased range of motion, decreased pain, improved attention, improved awareness and improved coordination.  Patient to discharge at an ambulatory level Modified Independent.    Recommendation:  Patient will benefit from ongoing skilled PT services in home health or outpatient to continue to advance safe functional mobility, address ongoing impairments in balance, activity tolerance, and coordination, and minimize fall risk.  Equipment: No equipment provided  Reasons for discharge: treatment goals met and discharge from hospital  Patient/family agrees with progress made and goals achieved: Yes   Skilled Therapeutic Intervention: No c/o pain.  Session focus on reassessment of pt's functional mobility, balance, strength, and coordination, as well as pt and family education regarding CLOF, expected progression and recovery, and f/u.    Pt transfers and ambulates throughout unit mod I.  Stair negotiation x12 steps with 2 rails and alternating pattern with distant supervision.  Car transfer mod I.  See below for reassessment and d/c notes.   Pt returned to room at end of session and positioned with call bell in reach and needs met.   PT Discharge Precautions/Restrictions Precautions Precautions: Sternal Restrictions Weight Bearing Restrictions: Yes Other Position/Activity Restrictions: Sternal precautions Pain Pain Assessment Pain Assessment: No/denies pain Pain Score: 0-No pain Vision/Perception  Vision - Assessment Eye Alignment: Within Functional Limits Tracking/Visual Pursuits: Decreased smoothness of horizontal  tracking;Decreased smoothness of vertical tracking;Requires cues, head turns, or add eye shifts to track Additional Comments: Pt wears glasses. 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  No further reports of diplopia or dizziness  Cognition Overall Cognitive Status: Within Functional Limits for tasks assessed Arousal/Alertness: Awake/alert Orientation Level: Oriented X4 Attention: Alternating Selective Attention: Appears intact Alternating Attention: Appears intact Memory: Appears intact Awareness: Appears intact Problem Solving: Appears intact Safety/Judgment: Appears intact Sensation Sensation Light Touch: Appears Intact Stereognosis: Not tested Hot/Cold: Not tested Proprioception: Appears Intact Coordination Gross Motor Movements are Fluid and Coordinated: Yes Fine Motor Movements are Fluid and Coordinated: Yes Finger Nose Finger Test: Mild dysmetria on Rt with some overshooting 9 Hole Peg Test: Rt: 40 seconds (noted some overshooting), Lt: 30 seconds on eval.  Rt: 32 seconds and Lt: 28 seconds (on 1/26) Motor  Motor Motor: Within Functional Limits  Mobility Bed Mobility Bed Mobility: Sit to Supine;Supine to Sit Supine to Sit: 6: Modified independent (Device/Increase time) Sit to Supine: 6: Modified independent (Device/Increase time) Transfers Transfers: Yes Sit to Stand: 6: Modified independent (Device/Increase time) Stand Pivot Transfers: 6: Modified independent (Device/Increase time) Locomotion  Ambulation Ambulation: Yes Ambulation/Gait Assistance: 6: Modified independent (Device/Increase time) Ambulation Distance (Feet): 300 Feet Assistive device: None Gait Gait: Yes Gait Pattern: Step-through pattern (some weaving with fatigue) Gait velocity: cues for self selected "normal" gait speed Stairs / Additional Locomotion Stairs: Yes Stairs Assistance: 5: Supervision Stair Management Technique: Two rails Number of Stairs: 12 Ramp: 6: Modified independent  (Device) Curb: 6: Modified independent (Device/increase time) Wheelchair Mobility Wheelchair Mobility: No  Trunk/Postural Assessment  Cervical Assessment Cervical Assessment: Within Functional Limits Thoracic Assessment Thoracic Assessment: Within Functional Limits Lumbar Assessment Lumbar Assessment: Within Functional Limits Postural Control Postural Control: Within Functional Limits  Balance Balance Balance Assessed: Yes Static Standing Balance Static Standing -  Balance Support: No upper extremity supported Static Standing - Level of Assistance: 6: Modified independent (Device/Increase time) Dynamic Standing Balance Dynamic Standing - Balance Support: No upper extremity supported;During functional activity Dynamic Standing - Level of Assistance: 6: Modified independent (Device/Increase time) Extremity Assessment  RUE Assessment RUE Assessment: Within Functional Limits (strength grossly 4/5, reports mild numbness in hands but overall WFL) LUE Assessment LUE Assessment: Within Functional Limits RLE Assessment RLE Assessment: Within Functional Limits (5/5 throughout) LLE Assessment LLE Assessment: Within Functional Limits (5/5 throughout)   See Function Navigator for Current Functional Status.  Maria Gallicchio E Penven-Crew 08/23/2016, 11:36 AM

## 2016-08-23 NOTE — Progress Notes (Signed)
Social Work Patient ID: [name], female   DOB: 05/19/1971, 46 y.o.   MRN: [id number] MD feels pt is medically stable for discharge after therapies today, writing order to discharge PICC line. Pt is pleased with this plan and agreeable to OP therapies. Work on discharge for later this afternoon.

## 2016-08-23 NOTE — Progress Notes (Signed)
Pt being discharge to care of husband and daughter. Provided education about insulin, and discharge medications. Informed not to drive until authorized by physician at follow up visit.  Family assured that pt would not drive.  Reminded of sternal precautions as well and to limit activity and adhere to weight restrictions.

## 2016-08-23 NOTE — Progress Notes (Signed)
Occupational Therapy Session Note  Patient Details  Name: [name] MRN: [id number] Date of Birth: 09/04/1970  Today's Date: 08/23/2016 OT Individual Time: [id number] OT Individual Time Calculation (min): 45 min    Short Term Goals: Week 1:  OT Short Term Goal 1 (Week 1): Pt will complete bathing at supervision level OT Short Term Goal 2 (Week 1): Pt will complete 2 grooming tasks in standing at supervision level OT Short Term Goal 3 (Week 1): Pt will utilize RUE as dominant UE at Mod I level OT Short Term Goal 4 (Week 1): Pt will complete housekeeping tasks with focus on endurance and activity tolerance at supervision level OT Short Term Goal 5 (Week 1): Pt will complete LB dressing at supervision level  Skilled Therapeutic Interventions/Progress Updates:   Pt with c/o regarding experience over night with RN, stating that she is ready to go home and is willing to continue therapy from home.  Discussed current therapy goals and engaged in self-care retraining to assess current level and readiness to go home.  Completed ADL retraining at overall mod I level.  Pt gathered clothing prior to shower, completing shower in standing without assistance.  Dressing completed without assistance and grooming completed in standing.  Pt reports RUE still somewhat numb in finger tips but overall functional.  Completed 9 hole peg test Rt: 32 and Lt: 28 seconds.  Engaged in simple meal prep and laundry tasks in ADL apt at overall Mod I level.  Discussed energy conservation strategies and recommendation for supervision/assistance when cooking larger meal, mostly due to energy levels.  Pt reports understanding.    Therapy Documentation Precautions:  Precautions Precautions: Fall, Sternal Restrictions Weight Bearing Restrictions: No RUE Weight Bearing:  (sternal precautions) General:   Vital Signs: Therapy Vitals Temp: 98.3 F (36.8 C) Temp Source: Oral Pulse Rate: 82 Resp: 20 BP: (!)  142/77 Patient Position (if appropriate): Lying Oxygen Therapy SpO2: 100 % O2 Device: Not Delivered Pain:   ADL:   Exercises:   Other Treatments:    See Function Navigator for Current Functional Status.   Therapy/Group: Individual Therapy  [name], [name] 08/23/2016, 7:41 AM

## 2016-08-23 NOTE — Progress Notes (Signed)
Social Work Patient ID: [name], female   DOB: 29-Apr-1971, 46 y.o.   MRN: [id number]  Met with pt who is requesting to be discharged after therapies today, doesn't want to spend another night here. Therapists feel she has reached her goals, MD aware of pt's request and will discuss with after rounding. Pam-PA aware of possible plan. Await MD's input of medical stability.

## 2016-08-23 NOTE — Progress Notes (Signed)
Pt had c/o that she did not want any meds or treatment and she is upset about the nurse who was assigned to her, reassured pt that I was going to be her nurse the rest of the night, apologized for her being upset over previous interaction with her nurse, however, pt still upset and states "I don't want anything all night, I just want to get home and have rehab at home".  Instructed pt that I have to at least check on her throughout the night, states understanding but states she is refusing everything else, instruct pt of the importance of her medications, including lovenox for blood clot prevention and her insulin, pt still refuses all meds. Let her know to call me if she changes her mind and that I will be checking on her every hour.

## 2016-08-23 NOTE — Progress Notes (Signed)
Speech Language Pathology Discharge Summary  Patient Details  Name: [name] MRN: [id number] Date of Birth: 02/25/1971  Patient has met 3 of 4 long term goals.  Patient to discharge at overall Modified Independent;Supervision level.   Reasons goals not met: Patient with short length of stay and continues to require supervision cues for complex problem solving   Clinical Impression/Discharge Summary: Patient has made functional gains and has met 3 of 4 LTG's this admission. Currently, patient is Mod I for alternating attention, anticipatory awareness and recall of information. Patient does require intermittent supervision verbal cues for complex problem solving and organization. Patient education complete and patient will discharge home today with supervision from family. Suspect patient is at her cognitive baseline, therefore, skilled SLP f/u is not warranted at this time.   Care Partner:  Caregiver Able to Provide Assistance: Yes     Recommendation:  None      Equipment: N/A   Reasons for discharge: Discharged from hospital   Patient/Family Agrees with Progress Made and Goals Achieved: Yes     [place], [name] 08/23/2016, 3:18 PM

## 2016-08-23 NOTE — Progress Notes (Signed)
Social Work  Discharge Note  The overall goal for the admission was met for:   Discharge location: [place]  Length of Stay: Yes-4 DAYS  Discharge activity level: Yes-MOD/I LEVEL  Home/community participation: Yes  Services provided included: MD, RD, PT, OT, SLP, RN, CM, Pharmacy and SW  Financial Services: Private Insurance: Media  Follow-up services arranged: Outpatient: [facility]- PT &OT 1/30 9:00-11:00 AM  Comments (or additional information):PT DID WELL AND PROGRESSED QUICKLY IN THERAPIES WANTING TO GO HOME ASAP. THERAPY TEAM AGREEABLE AND FEELS READY FOR DC TODAY. MD AGREEABLE AND FEELS MEDICALLY STABLE AND READY FOR DC TODAY.   Patient/Family verbalized understanding of follow-up arrangements: Yes  Individual responsible for coordination of the follow-up plan: SELF & HUSBAND  Confirmed correct DME delivered: Elease Hashimoto 08/23/2016    Elease Hashimoto

## 2016-08-23 NOTE — Progress Notes (Signed)
Nurse called to the room during rounds/report for complaints of SOB. Both nurses arrived patient on the phone talking to her daughter gasping to breath. Patient slumped down in bed. Both nurses assisted patient up in bed.  Vital signs checked  O2 sats 100% on room air. HR 90, BP 134/82. Patient asked if she was experiencing anxiety. Offered patients prn xanax. Patient became angry and refused to take medication. Patient stated, "I fine".   Harvel Ricksan Anguilli, PA made aware.

## 2016-08-23 NOTE — Discharge Summary (Signed)
Physician Discharge Summary  Patient ID: [name] MRN: [id number] DOB/AGE: [birth year] 45 y.o.  Admit date: 08/19/2016 Discharge date: 08/23/2016  Discharge Diagnoses:  Principal Problem:   Stroke due to embolism [facility]) Active Problems:   Diabetes mellitus with cardiac complication [facility])   Coronary artery disease involving coronary bypass graft of native heart without angina pectoris   Benign essential HTN   Morbid obesity ([place])   Atypical chest pain   Discharged Condition: stable   Significant Diagnostic Studies: N/A   Labs:  Basic Metabolic Panel:  Recent Labs Lab 08/17/16 0415 08/18/16 0451 08/20/16 0421 08/22/16 0456  NA 137 138 140 139  K 3.8 4.2 3.5 3.6  CL 99* 103 105 107  CO2 '28 28 28 27  ' GLUCOSE 127* 148* 81 122*  BUN '18 16 14 16  ' CREATININE 0.64 0.71 0.58 0.59  CALCIUM 8.8* 8.7* 8.9 8.7*    CBC:  Recent Labs Lab 08/17/16 0415 08/18/16 0451 08/20/16 0421  WBC 15.2* 15.7* 12.4*  NEUTROABS  --   --  8.0*  HGB 10.5* 10.2* 10.1*  HCT 32.4* 31.3* 31.1*  MCV 92.0 91.5 90.7  PLT 611* 685* 670*    CBG:  Recent Labs Lab 08/22/16 1644 08/22/16 1723 08/22/16 2028 08/23/16 0622 08/23/16 1139  GLUCAP 66 116* 169* 137* 157*    Brief HPI:   Lindsey Cherry-Gaineyis a 46 y.o.femalewith history of HTN, headaches, T2DM--medication non-comliance due to cost who was admitted on 08/07/16 with severe CP.  Cardiac cath done revealing severe multivessel disease and she underwent CABG X 3 with sternal plating. Post op with lethargy and right sided weakness with CT head revealing cute on chronic multiple small vessel infarcts. CTA head/neck done revaling proximal BA thrombus, BS and L-VA stenosis and remote pontine and cerebellar infarcts. BLE negative for DVT. . Dr. Erlinda Vega felt stroke likely embolic due to surgery or multiple stroke risk factors, continue ASA/Plavix and consider repeat CTA neck in 2-3 months to follow up on BA thrombus. Fluid overload  treated with IV diuresis with improvement and GI symptoms improving. Therapy ongoing and patient limited by right sided weakness, delayed processing and decreased awareness of deficits.   CIR recommended for follow up therapy.    Hospital Course: [name] was admitted to rehab 45/22/2018 for inpatient therapies to consist of PT, ST and OT at least three hours five days a week. Past admission physiatrist, therapy team and rehab RN have worked together to provide customized collaborative inpatient rehab. She has issues with chest pressure and diaphoresis am past admission and cardiology was consulted for input and felt that symptoms were consistent with orthostatic changes. She has had intermittent episode of chest discomfort due to  dyspepsia.  Blood pressures have been monitored on bid basis and has been reasonably controlled.  Leucocytosis is resolving and she has been afebrile without signs of infection.  Sternal incision is C/D/I and healing without signs of infection.  Follow up labs reveal that renal status and ABLA is stable.  Diabetes has been monitored with ac/hs cbg checks and blood sugars are showing improvement.  She is to continue to monitor BS ac/hs and use SSI for elevated BS after discharge.  She has progressed to modified independent level and will continue to receive follow up outpatient PT and OT at [facility] after discharge.    Rehab course: During patient's stay in rehab weekly team conferences were held to monitor patient's progress, set goals and discuss barriers to discharge. At admission, patient  required min assist with ADL tasks and mobility.  Cognitive evaluation revealed delayed processing with higher level tasks and she needed extra time for all tasks.  She has had improvement in activity tolerance, balance, postural control, as well as ability to compensate for deficits. She is has had improvement in functional use RUE  and RLE as well as improved awareness.  She is able to complete ADL tasks at modified independent level. She is ambulating 300' without AD and requires supervision to navigate stairs.  She is modified independent for attention, awareness and recall of information. She requires intermittent supervision for complex problem solving and attention.     Disposition: 01-Home or Self Care  Diet: Heart Healthy. Carb Modified.   Special Instructions: 1. Continue sternal precautions.  2. Check BS ac/hs and follow SSI as instructed. 3. Contact MD if you develop any problems with your incision/wound--redness, swelling, increase in pain, drainage or if you develop fever or chills.  4. No driving or strenuous activity till cleared by MD.    Allergies as of 08/23/2016      Reactions   Eugenol Hives, Other (See Comments)   Burns mouth   Hydrochlorothiazide Anaphylaxis   Morphine And Related Anaphylaxis   Percocet [oxycodone-acetaminophen] Anaphylaxis, Nausea And Vomiting   Shellfish Allergy Anaphylaxis   Sulfa Antibiotics Anaphylaxis   Tomato Anaphylaxis      Medication List    STOP taking these medications   enoxaparin 40 MG/0.4ML injection Commonly known as:  LOVENOX   insulin aspart 100 UNIT/ML injection Commonly known as:  novoLOG     TAKE these medications   aspirin 325 MG EC tablet Take 1 tablet (325 mg total) by mouth daily.   atorvastatin 80 MG tablet Commonly known as:  LIPITOR Take 1 tablet (80 mg total) by mouth daily at 6 PM.   blood glucose meter kit and supplies Kit Dispense based on patient and insurance preference. Use up to four times daily as directed. (FOR ICD-9 250.00, 250.01).   clopidogrel 75 MG tablet Commonly known as:  PLAVIX Take 1 tablet (75 mg total) by mouth daily.   furosemide 40 MG tablet Commonly known as:  LASIX Take 1 tablet (40 mg total) by mouth daily.   Insulin Detemir 100 UNIT/ML Pen Commonly known as:  LEVEMIR Inject 30 Units into the skin daily after breakfast. What changed:   when to take this   Insulin Pen Needle 31G X 8 MM Misc Commonly known as:  CLICKFINE PEN NEEDLES 1 application by Does not apply route as directed.   lidocaine 5 % Commonly known as:  LIDODERM On at 8 am and off at 8 pm daily Notes to patient:  Insurance usually does not cover this--you can buy the over the counter patches.    lisinopril 5 MG tablet Commonly known as:  PRINIVIL,ZESTRIL Take 1 tablet (5 mg total) by mouth daily.   metoprolol tartrate 25 MG tablet Commonly known as:  LOPRESSOR Take 1 tablet (25 mg total) by mouth 2 (two) times daily.   NASACORT ALLERGY 24HR 55 MCG/ACT Aero nasal inhaler Generic drug:  triamcinolone Place 1 spray into both nostrils daily.   nitroGLYCERIN 0.4 MG SL tablet Commonly known as:  NITROSTAT Place 1 tablet (0.4 mg total) under the tongue every 5 (five) minutes as needed for chest pain. Max 3 doses--call MD if you need to use 2 or more pills   pantoprazole 40 MG tablet Commonly known as:  PROTONIX Take 1 tablet (40 mg total)  by mouth daily before breakfast. Start taking on:  08/24/2016 Notes to patient:  Can use Pepcid or Zantac if this is too expensive.    potassium chloride 10 MEQ tablet Commonly known as:  K-DUR,KLOR-CON Take 1 tablet (10 mEq total) by mouth daily.   senna-docusate 8.6-50 MG tablet Commonly known as:  Senokot-S Take 1 tablet by mouth at bedtime. Notes to patient:  For constipation   traMADol 50 MG tablet--Rx # 15 pills Commonly known as:  ULTRAM Take 1 tablet (50 mg total) by mouth every 12 (twelve) hours as needed for moderate pain. What changed:  when to take this      Follow-up Information    REESE,BETTI D, MD Follow up on 09/04/2016.   Specialty:  Family Medicine Why:  Appointment @ 10:45 am  Needs to contact business office Monday 1/29 Contact information: [address] [place] [address] [phone]        Charlett Blake, MD Follow up.   Specialty:  Physical Medicine and  Rehabilitation Why:  office to call with follow up appointment Contact information: [place] [address] [phone]        Grace Isaac, MD. Call in 1 day(s).   Specialty:  Cardiothoracic Surgery Why:  for follow up appointment Contact information: [address] [place] [address] [phone]        Xu,Jindong, MD. Call in 1 day(s).   Specialty:  Neurology Why:  for follow up appointment Contact information: [place] [address] [phone]        Kirk Ruths, MD. Call in 1 day(s).   Specialty:  Cardiology Why:  for follow up appointment Contact information: [address] [place] [address] [phone]           Signed: Bary Leriche 08/23/2016, 4:38 PM Dictation #2 [id number]  [id number]

## 2016-08-23 NOTE — Progress Notes (Signed)
Speech Language Pathology Daily Session Note  Patient Details  Name: [name] MRN: [id number] Date of Birth: 11/26/1970  Today's Date: 08/23/2016 SLP Individual Time: 1000-1030 SLP Individual Time Calculation (min): 30 min  Short Term Goals: Week 1: SLP Short Term Goal 1 (Week 1): Pt will demonstrate alternating attention between complex tasks for ~30 minutes with supervision verbal cues.  SLP Short Term Goal 2 (Week 1): Pt will desmonstrate anticipatory awareness and identify 3 tasks that she can participate in at home safely with suppervision cues.  SLP Short Term Goal 3 (Week 1): Pt will recall and implement sternal precautions with Mod I.  SLP Short Term Goal 4 (Week 1): Pt will demonstrate ability to complete complex reasoning tasks with supervision cues.   Skilled Therapeutic Interventions: Pt preparing for D/C this afternoon.  Completed verbal alternating attention tasks with 100% accuracy and no cues.  Recalled sternal precautions independently.  Reasoning tasks involving medications and questions she had for physicians required supervision to organize/write down information.  Supervision for basic math tasks- pt agrees will need help/review from her spouse when handling bills and finances.  Pt has made good progress toward goals.      Function:  Eating Eating   Modified Consistency Diet: No Eating Assist Level: More than reasonable amount of time           Cognition Comprehension Comprehension assist level: Follows complex conversation/direction with extra time/assistive device  Expression   Expression assist level: Expresses basic 90% of the time/requires cueing < 10% of the time.  Social Interaction Social Interaction assist level: Interacts appropriately 90% of the time - Needs monitoring or encouragement for participation or interaction.  Problem Solving Problem solving assist level: Solves basic 90% of the time/requires cueing < 10% of the time  Memory  Memory assist level: Recognizes or recalls 90% of the time/requires cueing < 10% of the time    Pain Pain Assessment Pain Assessment: No/denies pain Pain Score: 0-No pain  Therapy/Group: Individual Therapy  Blenda MountsCouture, Terrea Bruster Laurice 08/23/2016, 12:40 PM

## 2016-08-23 NOTE — Discharge Instructions (Signed)
Inpatient Rehab Discharge Instructions  [name] Discharge date and time:  08/24/15  Activities/Precautions/ Functional Status: Activity: no lifting, driving, or strenuous exercise  till cleared by MD. Maintain sternal precautions.  Diet: cardiac diet and diabetic diet Wound Care: Wash with soap and water. Keep wound clean and dry   Functional status:  ___ No restrictions     ___ Walk up steps independently ___ 24/7 supervision/assistance   ___ Walk up steps with assistance _X__ Intermittent supervision/assistance  _X__ Bathe/dress independently ___ Walk with walker     ___ Bathe/dress with assistance ___ Walk Independently    ___ Shower independently ___ Walk with assistance    ___ Shower with assistance _X__ No alcohol     ___ Return to work/school ________  Special Instructions: 1. Monitor blood sugars 3-4 times per day and record 2. Use Humalog (short acting insulin) three times a day before meals for following range:  For blood sugars range 80-120: Use 2 units  For range  121 - 150:  Use 4 units  For range  150 - 200:  Use 6 units  For range  200 - 250:  Use 8 units  For range  251- 300:   Use 10 units.  Greater than 300 --call MD for input.  3. Maintain sternal precautions--no pushing, pulling or lifting anything over 5 lbs, no driving.    COMMUNITY REFERRALS UPON DISCHARGE:    Outpatient: PT & OT  Agency:[facility] Phone:[phone] Date of Last Service:08/23/2016  Appointment Date/Time: Jake SharkJANUARY 30-Tuesday 9:00-11:00 AM  Medical Equipment/Items Ordered:NO NEEDS   GENERAL COMMUNITY RESOURCES FOR PATIENT/FAMILY: Support Groups:[facility] SUPPORT GROUP EVERY SECOND Thursday @ 3:00-4:00 PM ON THE REHAB UNIT QUESTIONS CONTACT CAITLYN [phone]  My questions have been answered and I understand these instructions. I will adhere to these goals and the provided educational materials after my discharge from the hospital.  Patient/Caregiver  Signature _______________________________ Date __________  Clinician Signature _______________________________________ Date __________  Please bring this form and your medication list with you to all your follow-up doctor's appointments.

## 2016-08-26 ENCOUNTER — Telehealth: Payer: Self-pay

## 2016-08-26 NOTE — Telephone Encounter (Signed)
Appointment time Confirmed 2:20pm, arrive time  Confirmed 2:00 pm W/ Dr. Allena KatzPatel 1st then Dr. Wynn [address]Kirsteins [address]  Paperwork mailed      Transitional Care call- Lindsey BailiffGegorria Vega  1. Are you/is patient experiencing any problems since coming home? No Are there any questions regarding any aspect of care? No 2. Are there any questions regarding medications administration/dosing? No Are meds being taken as prescribed?Yes Patient should review meds with caller to confirm 3. Have there been any falls? No 4. Has Home Health been to the house and/or have they contacted you? No If not, have you tried to contact them? No 5. Are bowels and bladder emptying properly? Yes   6. Any fevers, problems with breathing, unexpected pain? No 7. Are there any skin problems or new areas of breakdown? No 8. Has the patient/family member arranged specialty MD follow up (ie cardiology/neurology/renal/surgical/etc)? Yes  Can we help arrange? No 9. Does the patient need any other services or support that we can help arrange? Not at this time 10. Are caregivers following through as expected in assisting the patient? Yes 11. Has the patient quit smoking, drinking alcohol, or using drugs as recommended? Yes

## 2016-08-27 ENCOUNTER — Ambulatory Visit: Payer: BLUE CROSS/BLUE SHIELD | Attending: Physical Medicine & Rehabilitation | Admitting: Physical Therapy

## 2016-08-27 ENCOUNTER — Ambulatory Visit: Payer: BLUE CROSS/BLUE SHIELD | Admitting: Occupational Therapy

## 2016-08-27 ENCOUNTER — Encounter: Payer: Self-pay | Admitting: Physical Therapy

## 2016-08-27 VITALS — BP 140/93 | HR 63

## 2016-08-27 DIAGNOSIS — R2689 Other abnormalities of gait and mobility: Secondary | ICD-10-CM | POA: Insufficient documentation

## 2016-08-27 DIAGNOSIS — I69351 Hemiplegia and hemiparesis following cerebral infarction affecting right dominant side: Secondary | ICD-10-CM | POA: Insufficient documentation

## 2016-08-27 DIAGNOSIS — I69318 Other symptoms and signs involving cognitive functions following cerebral infarction: Secondary | ICD-10-CM | POA: Diagnosis present

## 2016-08-27 DIAGNOSIS — R278 Other lack of coordination: Secondary | ICD-10-CM | POA: Insufficient documentation

## 2016-08-27 DIAGNOSIS — M6281 Muscle weakness (generalized): Secondary | ICD-10-CM

## 2016-08-27 NOTE — Therapy (Signed)
[facility] [address] [place], [place], [address] Phone: [phone]   Fax:  [phone]  Occupational Therapy Evaluation  Patient Details  Name: [name] MRN: [id number] Date of Birth: 1971/07/03 Referring Provider: Dr. Claudette Laws  Encounter Date: 08/27/2016      OT End of Session - 08/27/16 1212    Visit Number 1   Number of Visits 9   Date for OT Re-Evaluation 09/27/16   Authorization Type BC/BS   OT Start Time 1015   OT Stop Time 1100   OT Time Calculation (min) 45 min   Activity Tolerance Patient tolerated treatment well      Past Medical History:  Diagnosis Date  . Headache    due to elevated  BP  . History of kidney stones    "passed them"  . Hypertension   . Pancreatic injury    "damage to pancrease related to gallbladder OR"  . Pneumonia X 1  . Type II diabetes mellitus (HCC)     Past Surgical History:  Procedure Laterality Date  . CARDIAC CATHETERIZATION  08/07/2016  . CARDIAC CATHETERIZATION N/A 08/07/2016   Procedure: Left Heart Cath and Coronary Angiography;  Surgeon: Lyn Records, MD;  Location: [facility] INVASIVE CV LAB;  Service: Cardiovascular;  Laterality: N/A;  . CARDIAC CATHETERIZATION N/A 08/07/2016   Procedure: Intravascular Pressure Wire/FFR Study;  Surgeon: Lyn Records, MD;  Location: [facility] INVASIVE CV LAB;  Service: Cardiovascular;  Laterality: N/A;  . CORONARY ARTERY BYPASS GRAFT N/A 08/09/2016   Procedure: CORONARY ARTERY BYPASS GRAFTING (CABG) x three,SVG to distal circ OM3, SVG to Om1, LIMA to LAD-using left internal mammary artery and right leg greater saphenous vein harvested endoscopically, Sternal plating - right side;  Surgeon: Delight Ovens, MD;  Location: [facility] OR;  Service: Open Heart Surgery;  Laterality: N/A;  . FACIAL RECONSTRUCTION SURGERY  2009   "facial fractures"  . FRACTURE SURGERY    . LAPAROSCOPIC CHOLECYSTECTOMY    . TEE WITHOUT CARDIOVERSION  N/A 08/09/2016   Procedure: TRANSESOPHAGEAL ECHOCARDIOGRAM (TEE);  Surgeon: Delight Ovens, MD;  Location: [facility] OR;  Service: Open Heart Surgery;  Laterality: N/A;  . TUBAL LIGATION      There were no vitals filed for this visit.      Subjective Assessment - 08/27/16 1023    Patient is accompained by: Family member  Husband   Limitations sternal precautions    Patient Stated Goals increase endurance and return to work   Currently in Pain? No/denies           [facility] OT Assessment - 08/27/16 1026      Assessment   Diagnosis CABG x3 with post op Lt CVA   Referring Provider Dr. Claudette Laws   Onset Date 08/09/16  surgery for CABG   Prior Therapy inpatient rehab     Precautions   Precautions Sternal   Precaution Comments Sternal precautions and no driving     Restrictions   Weight Bearing Restrictions No     Balance Screen   Has the patient fallen in the past 6 months No   Has the patient had a decrease in activity level because of a fear of falling?  No   Is the patient reluctant to leave their home because of a fear of falling?  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     ADL   Eating/Feeding Independent   Grooming Independent   Upper Body Bathing Minimal assistance  only to wash back   Lower Body Bathing Independent   Upper Body Dressing Independent   Lower Body Dressing Independent   Toilet Tranfer Independent   Toileting - Conservator, museum/gallery -  Tour manager Independent     IADL   Shopping Needs to be accompanied on any shopping trip   Light Housekeeping Performs light daily tasks such as dishwashing,  bed making   Meal Prep Able to complete simple cold meal and snack prep;Able to complete simple warm meal prep   Community Mobility Relies on family or friends for transportation   Medication Management Is responsible for taking medication in correct dosages at correct time   Development worker, community financial matters independently (budgets, writes checks, pays rent, bills goes to bank), collects and keeps track of income     Mobility   Mobility Status Independent     Written Expression   Dominant Hand Right   Handwriting 100% legible  Pt reports it's not back to where it was     Vision - History   Baseline Vision Wears glasses all the time   Additional Comments 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 Pt reports no changes from CVA     Activity Tolerance   Activity Tolerance Tolerates 30 min activity with muliple rests     Cognition   Memory Impaired   Memory Impairment Storage deficit;Retrieval deficit   Problem Solving Impaired   Problem Solving Impairment Functional complex   Executive Function Organizing;Decision Making     Observation/Other Assessments   Observations Spells "world" backwards accurately, mental subtractions by 7's with max difficulty and 1 reminder on what # she was on, but accurately. Immediate recall 3/3, delayed recall 1/3     Sensation   Light Touch Appears Intact  RUE   Additional Comments Pt reports numbness/tingling Rt hand     Coordination   9 Hole Peg Test Right;Left   Right 9 Hole Peg Test 33.50 sec   Left 9 Hole Peg Test 25.53 sec     Edema   Edema none in UE's     ROM / Strength   AROM / PROM / Strength AROM     AROM   Overall AROM Comments BUE AROM WNL's     Strength   Overall Strength Comments Bilateral shoulder flex 4+/5. Did not assess sh. abduction     Hand Function   Right Hand Grip (lbs) 50 lbs   Left Hand Grip (lbs) 65 lbs                              OT Long Term Goals -  08/27/16 1217      OT LONG TERM GOAL #1   Title Independent with HEP for coordination and strength RUE   Time 4   Period Weeks   Status New     OT LONG TERM GOAL #2   Title Improve coordination as evidenced by reducing speed on 9 hole peg test to 28 sec. or less   Baseline eval = 33.50 sec   Time 4  Period Weeks   Status New     OT LONG TERM GOAL #3   Title Improve grip strength Rt hand to 60 lbs or greater to open jars/containers   Baseline eval = 50 lbs (Lt = 65 lbs)    Time 4   Period Weeks   Status New     OT LONG TERM GOAL #4   Title Pt to verbalize understanding with memory compensatory strategies   Time 4   Period Weeks   Status New     OT LONG TERM GOAL #5   Title Pt to return to light IADLS including meal prep and light cleaning mod I level for 30 minute without rest   Time 4   Period Weeks   Status New     Long Term Additional Goals   Additional Long Term Goals Yes     OT LONG TERM GOAL #6   Title Pt to perform complex organizational and problem solving tasks with extra time and external aids prn, but no cueing at 90% or greater accuracy in prep for work related tasks   Time 4   Period Weeks   Status New               Plan - 08/27/16 1213    Clinical Impression Statement Pt is a 46 y.o. female who presents to outpatient rehab s/p CABG x 3 on 08/09/16 with post-op Lt CVA. Pt has current sternal precautions. Pt presents with decreased coordination, strength, endurance, and higher level cognition and would benefit from O.T. to address these deficits   Rehab Potential Good   OT Frequency 2x / week   OT Duration 4 weeks  plus eval   OT Treatment/Interventions Self-care/ADL training;DME and/or AE instruction;Moist Heat;Fluidtherapy;Patient/family education;Therapeutic exercises;Therapeutic activities;Neuromuscular education;Functional Mobility Training;Passive range of motion;Cognitive remediation/compensation;Electrical Stimulation;Energy  conservation;Manual Therapy;Visual/perceptual remediation/compensation   Plan issue coordination and putty HEP, If time - administer MOCA   Consulted and Agree with Plan of Care Patient;Family member/caregiver   Family Member Consulted husband      Patient will benefit from skilled therapeutic intervention in order to improve the following deficits and impairments:  Cardiopulmonary status limiting activity, Decreased coordination, Decreased endurance, Impaired sensation, Decreased activity tolerance, Decreased knowledge of precautions, Decreased knowledge of use of DME, Impaired UE functional use, Decreased cognition, Decreased mobility, Decreased strength, Impaired perceived functional ability  Visit Diagnosis: Other lack of coordination - Plan: Ot plan of care cert/re-cert  Muscle weakness (generalized) - Plan: Ot plan of care cert/re-cert  Other symptoms and signs involving cognitive functions following cerebral infarction - Plan: Ot plan of care cert/re-cert    Problem List Patient Active Problem List   Diagnosis Date Noted  . Atypical chest pain   . Stroke due to embolism (HCC) 08/19/2016  . Diabetes mellitus (HCC)   . Hypervolemia   . Stroke (HCC)   . Coronary artery disease involving coronary bypass graft of native heart without angina pectoris   . Hx of CABG   . Benign essential HTN   . Migraine without status migrainosus, not intractable   . Labile blood glucose   . Diabetes mellitus type 2 in obese (HCC)   . Morbid obesity (HCC)   . Tachypnea   . Hypoalbuminemia due to protein-calorie malnutrition (HCC)   . Leukocytosis   . Acute blood loss anemia   . Pressure injury of skin 08/12/2016  . Coronary artery disease involving native heart without angina pectoris   . 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  ACS (acute coronary syndrome) (HCC) 08/07/2016  . Hypertension 08/07/2016  . Diabetes mellitus with cardiac complication (HCC) 08/07/2016  . Non-STEMI (non-ST elevated myocardial  infarction) (HCC) 08/07/2016  . NSTEMI (non-ST elevated myocardial infarction) (HCC) 08/07/2016    Kelli Churn, OTR/L 08/27/2016, 12:23 PM  [facility] [address] [place], [place], [address] Phone: [phone]   Fax:  [phone]  Name: [name] MRN: [id number] Date of Birth: 07/05/1971

## 2016-08-27 NOTE — Therapy (Signed)
[facility] [address] [place], [place], [address] Phone: [phone]   Fax:  [phone]  Physical Therapy Evaluation  Patient Details  Name: [name] MRN: [id number] Date of Birth: 04/19/1971 Referring Provider: Erick ColaceAndrew E Kirsteins, MD  Encounter Date: 08/27/2016      PT End of Session - 08/27/16 1026    Visit Number 1   Number of Visits 17   Date for PT Re-Evaluation 10/26/16   Authorization Type BCBS   PT Start Time 0930   PT Stop Time 1020   PT Time Calculation (min) 50 min   Activity Tolerance Patient tolerated treatment well;No increased pain   Behavior During Therapy WFL for tasks assessed/performed      Past Medical History:  Diagnosis Date  . Headache    due to elevated  BP  . History of kidney stones    "passed them"  . Hypertension   . Pancreatic injury    "damage to pancrease related to gallbladder OR"  . Pneumonia X 1  . Type II diabetes mellitus (HCC)     Past Surgical History:  Procedure Laterality Date  . CARDIAC CATHETERIZATION  08/07/2016  . CARDIAC CATHETERIZATION N/A 08/07/2016   Procedure: Left Heart Cath and Coronary Angiography;  Surgeon: Lyn RecordsHenry W Smith, MD;  [facility];  Service: Cardiovascular;  Laterality: N/A;  . CARDIAC CATHETERIZATION N/A 08/07/2016   Procedure: Intravascular Pressure Wire/FFR Study;  Surgeon: Lyn RecordsHenry W Smith, MD;  [facility];  Service: Cardiovascular;  Laterality: N/A;  . CORONARY ARTERY BYPASS GRAFT N/A 08/09/2016   Procedure: CORONARY ARTERY BYPASS GRAFTING (CABG) x three,SVG to distal circ OM3, SVG to Om1, LIMA to LAD-using left internal mammary artery and right leg greater saphenous vein harvested endoscopically, Sternal plating - right side;  Surgeon: Delight OvensEdward B Gerhardt, MD;  [facility];  Service: Open Heart Surgery;  Laterality: N/A;  . FACIAL RECONSTRUCTION SURGERY  2009   "facial fractures"  . FRACTURE  SURGERY    . LAPAROSCOPIC CHOLECYSTECTOMY    . TEE WITHOUT CARDIOVERSION N/A 08/09/2016   Procedure: TRANSESOPHAGEAL ECHOCARDIOGRAM (TEE);  Surgeon: Delight OvensEdward B Gerhardt, MD;  [facility];  Service: Open Heart Surgery;  Laterality: N/A;  . TUBAL LIGATION      Vitals:   08/27/16 0945  BP: (!) 140/93  Pulse: 63  SpO2: 97%         Subjective Assessment - 08/27/16 0932    Subjective Pt presents to OPPT for evaluation s/p non-STEMI, CABG x 3 and post op L CVA with R sided weakness.  Pt D/C from hospital and CIR this past Friday.     Patient is accompained by: Family member   Limitations Lifting   Patient Stated Goals "Be back to normal and be able to do more for myself" Would like to be more independent   Currently in Pain? No/denies            [facility] PT Assessment - 08/27/16 0919      Assessment   Medical Diagnosis L CVA, Non-STEMI, CABG x 3   Referring Provider Erick ColaceAndrew E Kirsteins, MD   Onset Date/Surgical Date 08/07/16   Hand Dominance Right   Next MD Visit 09/04/2016 and 09/05/2016   Prior Therapy CIR s/p CABG and CVA     Precautions   Precautions Sternal     Balance Screen   Has the patient fallen in the past 6 months No   Has the patient  had a decrease in activity level because of a fear of falling?  Yes   Is the patient reluctant to leave their home because of a fear of falling?  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     Cognition    Overall Cognitive Status Within Functional Limits for tasks assessed     Observation/Other Assessments   Focus on Therapeutic Outcomes (FOTO)  Score 58 (42% limited; predicted 26% limited)     Observation/Other Assessments-Edema    Edema --  mild R foot     Sensation   Light Touch Impaired by gross assessment  tingling in R toes     Coordination   Gross Motor Movements are Fluid and Coordinated No   Finger Nose Finger Test mild dysmetria   Heel Shin Test limited by weakness     ROM / Strength   AROM / PROM / Strength Strength     Strength   Overall Strength Deficits   Overall Strength Comments 5/5 LLE, 4/5 R hip flexion, 5/5 knee and ankle but delayed activation     Transfers   Transfers Sit to Stand;Stand to Sit   Sit to Stand 6: Modified independent (Device/Increase time)   Stand to Sit 6: Modified independent (Device/Increase time)     Ambulation/Gait   Ambulation/Gait Yes   Ambulation Distance (Feet) 200 Feet   Assistive device None   Gait Pattern Step-through pattern;Shuffle;Wide base of support;Poor foot clearance - left;Poor foot clearance - right   Ambulation Surface Level   Gait velocity 2.98 (.11 seconds)   Stairs Yes   Stair Management Technique Two rails;Alternating pattern;Forwards   Number of Stairs 4   Height of Stairs 6     Standardized Balance Assessment   Standardized Balance Assessment Berg Balance Test     Berg Balance Test   Sit to Stand Able to stand without using hands and stabilize independently   Standing Unsupported Able to stand safely 2 minutes   Sitting with Back Unsupported but Feet Supported on Floor or Stool Able to sit safely and securely 2 minutes   Stand to Sit Sits safely with minimal use of hands   Transfers Able to transfer safely, minor use of hands   Standing Unsupported with Eyes Closed Able to stand 10 seconds with supervision   Standing Ubsupported with Feet Together Able to place feet together independently and stand for  1 minute with supervision   From Standing, Reach Forward with Outstretched Arm Can reach confidently >25 cm (10")   From Standing Position, Pick up Object from Floor Able to pick up shoe safely and easily   From Standing Position, Turn to Look Behind Over each Shoulder Looks behind one side only/other side shows less weight shift   Turn 360 Degrees Able to turn 360 degrees safely but slowly   Standing Unsupported, Alternately Place Feet on Step/Stool Able to stand independently and complete 8 steps >20 seconds  Standing Unsupported, One Foot in Front Able to plae foot ahead of the other independently and hold 30 seconds   Standing on One Leg Able to lift leg independently and hold 5-10 seconds   Total Score 48     Patient demonstrates increased fall risk as noted by score of 48/56 on Berg Balance Scale.  (<36= high risk for falls, close to 100%; 37-45 significant >80%; 46-51 moderate >50%; 52-55 lower >25%)      PT Education - 08/27/16 1025    Education provided Yes   Education Details impairments, POC, goals of therapy   Person(s) Educated Patient;Spouse   Methods Explanation   Comprehension Verbalized understanding          PT Short Term Goals - 08/27/16 1034      PT SHORT TERM GOAL #1   Title TARGET FOR ALL STG 09/25/2016-Pt will initiate HEP and demonstrate independence with performing    Status New     PT SHORT TERM GOAL #2   Title Pt will participate in 6 minute walk test of endurance with LTG to be set.   Status New     PT SHORT TERM GOAL #3   Title Pt will participate in Functional Gait Assessment (FGA) and LTG to be set.   Status New     PT SHORT TERM GOAL #4   Title Pt will negotiate 8 stairs with 2 rails alternating sequence supervision with no UE support   Status New     PT SHORT TERM GOAL #5   Title Pt will demonstrate lowered fall risk as indicated by a BERG balance score of 55/56.   Status New           PT Long Term Goals - 08/27/16 1041      PT  LONG TERM GOAL #1   Title TARGET DATE FOR ALL LTG 10/26/16: Pt will improve RLE strength to 5/5 overall   Status New     PT LONG TERM GOAL #2   Title Pt will improve FGA score by 4.2 points to demonstrate decreased falls risk during community ambulation.   Status New     PT LONG TERM GOAL #3   Title Pt will improve gait velocity to >63ft/sec for increased independence with community gait.     PT LONG TERM GOAL #4   Title Pt will improve 6 minute by 16ft to demonstrate improvement in functional endurance.   Status New     PT LONG TERM GOAL #5   Title Pt will negotiate >500 over level and uneven surfaces (indoor/outdoor) and will negotiate 10 stairs at mod I level    Status New               Plan - 08/27/16 1027    Clinical Impression Statement Pt is a 46 y.o. female who presents to OPPT for medium complexity PT eval after D/C from CIR s/p non-STEMI, CABG x 3 and L pontine and cerebellar CVA.  Pt presents today with impaired cardiopulmonary endurance, impaired sensation, motor planning, impaired strength, coordination, balance, gait and moderate falls risk.  Pt will benefit from skilled PT services to address these impairments and functional limitations to maximize functional mobility independence and reduce falls risk   Rehab Potential Good   Clinical Impairments Affecting Rehab Potential Cardipulmonary limitations   PT Frequency 2x / week   PT Duration 8 weeks   PT Treatment/Interventions ADLs/Self Care Home Management;Moist Heat;DME Instruction;Gait training;Stair training;Functional mobility training;Therapeutic activities;Therapeutic exercise;Balance training;Neuromuscular re-education;Patient/family education;Energy  conservation;Taping   PT Next Visit Plan MONITOR VITALS at every visit; initiate HEP.  Assess FGA and set goal   Consulted and Agree with Plan of Care Family member/caregiver;Patient   Family Member Consulted Husband-[name]      Patient will benefit from  skilled therapeutic intervention in order to improve the following deficits and impairments:  Abnormal gait, Cardiopulmonary status limiting activity, Decreased endurance, Decreased balance, Decreased coordination, Decreased strength, Difficulty walking, Impaired sensation  Visit Diagnosis: Hemiplegia and hemiparesis following cerebral infarction affecting right dominant side (HCC)  Other abnormalities of gait and mobility  Muscle weakness (generalized)     Problem List Patient Active Problem List   Diagnosis Date Noted  . Atypical chest pain   . Stroke due to embolism (HCC) 08/19/2016  . Diabetes mellitus (HCC)   . Hypervolemia   . Stroke (HCC)   . Coronary artery disease involving coronary bypass graft of native heart without angina pectoris   . Hx of CABG   . Benign essential HTN   . Migraine without status migrainosus, not intractable   . Labile blood glucose   . Diabetes mellitus type 2 in obese (HCC)   . Morbid obesity (HCC)   . Tachypnea   . Hypoalbuminemia due to protein-calorie malnutrition (HCC)   . Leukocytosis   . Acute blood loss anemia   . Pressure injury of skin 08/12/2016  . Coronary artery disease involving native heart without angina pectoris   . 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 ACS (acute coronary syndrome) (HCC) 08/07/2016  . Hypertension 08/07/2016  . Diabetes mellitus with cardiac complication (HCC) 08/07/2016  . Non-STEMI (non-ST elevated myocardial infarction) (HCC) 08/07/2016  . NSTEMI (non-ST elevated myocardial infarction) (HCC) 08/07/2016    Edman Circle, PT, DPT 08/27/16    10:51 AM    [facility] [address] [place], [place], [address] Phone: [phone]   Fax:  [phone]  Name: [name] MRN: [id number] Date of Birth: [birth year]

## 2016-08-28 ENCOUNTER — Telehealth: Payer: Self-pay | Admitting: Physical Therapy

## 2016-08-28 NOTE — Telephone Encounter (Signed)
Dr. Tyrone SageGerhardt, Pt presented to PT evaluation 08/27/2016.  At rest pt BP 140/93 and 134/93 after activity without angina or other symptoms of distress.  Due to patient's recent non-STEMI, CABG and CVA please advise regarding BP/HR parameters for therapy and graded activities.  Thank you, Edman CircleAudra Hall, PT, DPT 08/28/16    7:56 AM

## 2016-08-30 ENCOUNTER — Telehealth: Payer: Self-pay | Admitting: Physical Therapy

## 2016-08-30 NOTE — Telephone Encounter (Signed)
Dr. Tyrone SageGerhardt, I apologize if you receive this message twice..I attempted to send it via telephone encounter and am now re-sending to your in basket.    Pt presented to PT evaluation 08/27/2016.  At rest pt BP 140/93 and 134/93 after activity without angina or other symptoms of distress.  Due to patient's recent non-STEMI, CABG and CVA please advise regarding BP/HR parameters for therapy and graded activities.  Thank you, Edman CircleAudra Hall, PT, DPT

## 2016-09-02 ENCOUNTER — Telehealth: Payer: Self-pay | Admitting: Physical Therapy

## 2016-09-02 NOTE — Telephone Encounter (Signed)
Thank you for the recommendations.

## 2016-09-02 NOTE — Telephone Encounter (Signed)
I would not exercise if BP greater than 160/100.

## 2016-09-02 NOTE — Telephone Encounter (Signed)
Pt presented to PT evaluation 08/27/2016. At rest pt BP 140/93 and 134/93 after activity without angina or other symptoms of distress. Due to patient's recent non-STEMI, CABG and CVA please advise regarding BP/HR parameters for therapy and graded activities.  Pt returns for PT treatment session on Friday 09/06/16.    Thank you, Edman CircleAudra Hall, PT, DPT 09/02/16    8:36 AM

## 2016-09-03 ENCOUNTER — Ambulatory Visit: Payer: BLUE CROSS/BLUE SHIELD | Admitting: Physical Therapy

## 2016-09-03 ENCOUNTER — Ambulatory Visit: Payer: BLUE CROSS/BLUE SHIELD | Attending: Physical Medicine & Rehabilitation | Admitting: Occupational Therapy

## 2016-09-03 VITALS — BP 154/92

## 2016-09-03 DIAGNOSIS — R2689 Other abnormalities of gait and mobility: Secondary | ICD-10-CM

## 2016-09-03 DIAGNOSIS — R278 Other lack of coordination: Secondary | ICD-10-CM | POA: Insufficient documentation

## 2016-09-03 DIAGNOSIS — I69318 Other symptoms and signs involving cognitive functions following cerebral infarction: Secondary | ICD-10-CM | POA: Diagnosis present

## 2016-09-03 DIAGNOSIS — M6281 Muscle weakness (generalized): Secondary | ICD-10-CM | POA: Insufficient documentation

## 2016-09-03 DIAGNOSIS — I69351 Hemiplegia and hemiparesis following cerebral infarction affecting right dominant side: Secondary | ICD-10-CM

## 2016-09-03 NOTE — Therapy (Signed)
[facility] [address] [place], [place], [address] Phone: [phone]   Fax:  [phone]  Physical Therapy Treatment  Patient Details  Name: [name] MRN: [id number] Date of Birth: 06/12/1971 Referring Provider: Erick ColaceAndrew E Kirsteins, MD  Encounter Date: 09/03/2016      PT End of Session - 09/03/16 1454    Visit Number 2   Number of Visits 17   Date for PT Re-Evaluation 10/26/16   Authorization Type BCBS   PT Start Time 1447   PT Stop Time 1530   PT Time Calculation (min) 43 min   Activity Tolerance Patient tolerated treatment well;No increased pain   Behavior During Therapy WFL for tasks assessed/performed      Past Medical History:  Diagnosis Date  . Headache    due to elevated  BP  . History of kidney stones    "passed them"  . Hypertension   . Pancreatic injury    "damage to pancrease related to gallbladder OR"  . Pneumonia X 1  . Type II diabetes mellitus (HCC)     Past Surgical History:  Procedure Laterality Date  . CARDIAC CATHETERIZATION  08/07/2016  . CARDIAC CATHETERIZATION N/A 08/07/2016   Procedure: Left Heart Cath and Coronary Angiography;  Surgeon: Lyn RecordsHenry W Smith, MD;  [facility];  Service: Cardiovascular;  Laterality: N/A;  . CARDIAC CATHETERIZATION N/A 08/07/2016   Procedure: Intravascular Pressure Wire/FFR Study;  Surgeon: Lyn RecordsHenry W Smith, MD;  [facility];  Service: Cardiovascular;  Laterality: N/A;  . CORONARY ARTERY BYPASS GRAFT N/A 08/09/2016   Procedure: CORONARY ARTERY BYPASS GRAFTING (CABG) x three,SVG to distal circ OM3, SVG to Om1, LIMA to LAD-using left internal mammary artery and right leg greater saphenous vein harvested endoscopically, Sternal plating - right side;  Surgeon: Delight OvensEdward B Gerhardt, MD;  [facility];  Service: Open Heart Surgery;  Laterality: N/A;  . FACIAL RECONSTRUCTION SURGERY  2009   "facial fractures"  . FRACTURE  SURGERY    . LAPAROSCOPIC CHOLECYSTECTOMY    . TEE WITHOUT CARDIOVERSION N/A 08/09/2016   Procedure: TRANSESOPHAGEAL ECHOCARDIOGRAM (TEE);  Surgeon: Delight OvensEdward B Gerhardt, MD;  [facility];  Service: Open Heart Surgery;  Laterality: N/A;  . TUBAL LIGATION      Vitals:   09/03/16 1455  BP: (!) 154/92 after session        Subjective Assessment - 09/03/16 1455    Subjective No new compliants. No falls or pain to report. Mom reports her BP was elevated yesterday.   Patient is accompained by: Family member   Pertinent History Hold exercise if BP goes above 160/100 per cardiologist recommendations.   Limitations Lifting   Patient Stated Goals "Be back to normal and be able to do more for myself" Would like to be more independent   Currently in Pain? No/denies   Pain Score 0-No pain            [facility] PT Assessment - 09/03/16 1458      6 Minute Walk- Baseline   6 Minute Walk- Baseline yes   BP (mmHg) 150/88   HR (bpm) 82   02 Sat (%RA) 96 %   Modified Borg Scale for Dyspnea 0- Nothing at all   Perceived Rate of Exertion (Borg) 6-     6 Minute walk- Post Test   6 Minute Walk Post Test yes   BP (mmHg) 156/90   HR (bpm) 96   02 Sat (%RA) 98 %  Modified Borg Scale for Dyspnea 0- Nothing at all   Perceived Rate of Exertion (Borg) 9- very light     6 minute walk test results    Aerobic Endurance Distance Walked 969     Functional Gait  Assessment   Gait assessed  Yes   Gait Level Surface Walks 20 ft in less than 7 sec but greater than 5.5 sec, uses assistive device, slower speed, mild gait deviations, or deviates 6-10 in outside of the 12 in walkway width.  6.25   Change in Gait Speed Able to change speed, demonstrates mild gait deviations, deviates 6-10 in outside of the 12 in walkway width, or no gait deviations, unable to achieve a major change in velocity, or uses a change in velocity, or uses an assistive device.   Gait with Horizontal Head Turns Performs head turns  smoothly with slight change in gait velocity (eg, minor disruption to smooth gait path), deviates 6-10 in outside 12 in walkway width, or uses an assistive device.   Gait with Vertical Head Turns Performs task with slight change in gait velocity (eg, minor disruption to smooth gait path), deviates 6 - 10 in outside 12 in walkway width or uses assistive device   Gait and Pivot Turn Pivot turns safely in greater than 3 sec and stops with no loss of balance, or pivot turns safely within 3 sec and stops with mild imbalance, requires small steps to catch balance.   Step Over Obstacle Is able to step over one shoe box (4.5 in total height) without changing gait speed. No evidence of imbalance.   Gait with Narrow Base of Support Ambulates 4-7 steps.   Gait with Eyes Closed Walks 20 ft, slow speed, abnormal gait pattern, evidence for imbalance, deviates 10-15 in outside 12 in walkway width. Requires more than 9 sec to ambulate 20 ft.  12 sec's   Ambulating Backwards Walks 20 ft, uses assistive device, slower speed, mild gait deviations, deviates 6-10 in outside 12 in walkway width.   Steps Alternating feet, must use rail.   Total Score 18      issued the following to pt's HEP: Functional Quadriceps: Sit to Stand    Sit on edge of chair, feet flat on floor. Stand upright, extending knees fully. Do not use arms. Repeat _10___ times per set. Do _1_ sets per session. Do _1-2_ sessions per day.  http://orth.exer.us/735   Copyright  VHI. All rights reserved.   Single Leg - Eyes Open    Holding kitchen counter top, lift left leg while maintaining balance over right leg. Progress to removing hands from support surface for longer periods of time. Hold__10__ seconds. Repeat _3___ times per session. Do _1-2_ sessions per day.  Copyright  VHI. All rights reserved.    Tandem Walking    Holding kitchen counter top: Walk a straight line forward placing each foot directly in front of other one and  then walk a straight line backwards by placing one foot directly behind the other one.  Repeat for 3 laps each way. 1-2 times a day.  Copyright  VHI. All rights reserved.          PT Education - 09/03/16 2125    Education provided Yes   Education Details HEP for strengthening and balance   Person(s) Educated Patient;Parent(s)   Methods Explanation;Demonstration;Verbal cues;Handout   Comprehension Verbalized understanding;Returned demonstration;Need further instruction          PT Short Term Goals - 09/03/16 2132  PT SHORT TERM GOAL #1   Title TARGET FOR ALL STG 09/25/2016-Pt will initiate HEP and demonstrate independence with performing    Status On-going     PT SHORT TERM GOAL #2   Title Pt will participate in 6 minute walk test of endurance with LTG to be set.   Baseline 09/03/16: 969 feet with no AD or rest breaks taken   Status Achieved     PT SHORT TERM GOAL #3   Title Pt will participate in Functional Gait Assessment (FGA) and LTG to be set.   Baseline 09/03/16: 18/30 for baseline values   Status Achieved     PT SHORT TERM GOAL #4   Title Pt will negotiate 8 stairs with 2 rails alternating sequence supervision with no UE support   Status On-going     PT SHORT TERM GOAL #5   Title Pt will demonstrate lowered fall risk as indicated by a BERG balance score of 55/56.   Status On-going           PT Long Term Goals - 08/27/16 1041      PT LONG TERM GOAL #1   Title TARGET DATE FOR ALL LTG 10/26/16: Pt will improve RLE strength to 5/5 overall   Status New     PT LONG TERM GOAL #2   Title Pt will improve FGA score by 4.2 points to demonstrate decreased falls risk during community ambulation.   Status New     PT LONG TERM GOAL #3   Title Pt will improve gait velocity to >97ft/sec for increased independence with community gait.     PT LONG TERM GOAL #4   Title Pt will improve 6 minute by 175ft to demonstrate improvement in functional endurance.   Status New       PT LONG TERM GOAL #5   Title Pt will negotiate >500 over level and uneven surfaces (indoor/outdoor) and will negotiate 10 stairs at mod I level    Status New            Plan - 09/03/16 1454    Clinical Impression Statement Functional gait assessment and 6 minute walk test performed today to establish baseline values. Remainder of session focused on establishing an HEP for strengthening and balance. Pt is making steady progress toward goals and should benefit from continued PT to progress toward unmet goals.   Rehab Potential Good   Clinical Impairments Affecting Rehab Potential Cardipulmonary limitations   PT Frequency 2x / week   PT Duration 8 weeks   PT Treatment/Interventions ADLs/Self Care Home Management;Moist Heat;DME Instruction;Gait training;Stair training;Functional mobility training;Therapeutic activities;Therapeutic exercise;Balance training;Neuromuscular re-education;Patient/family education;Energy conservation;Taping   PT Next Visit Plan MONITOR VITALS at every visit; review HEP and add to it as indicated; continued to work on LE strengthening, high level balance (on compliant surfaces and single leg stance) and activity tolerance.      Consulted and Agree with Plan of Care Family member/caregiver;Patient   Family Member Consulted Husband-[name]      Patient will benefit from skilled therapeutic intervention in order to improve the following deficits and impairments:  Abnormal gait, Cardiopulmonary status limiting activity, Decreased endurance, Decreased balance, Decreased coordination, Decreased strength, Difficulty walking, Impaired sensation  Visit Diagnosis: Muscle weakness (generalized)  Other symptoms and signs involving cognitive functions following cerebral infarction  Hemiplegia and hemiparesis following cerebral infarction affecting right dominant side (HCC)  Other abnormalities of gait and mobility     Problem List Patient Active Problem List  Diagnosis Date Noted  . Atypical chest pain   . Stroke due to embolism (HCC) 08/19/2016  . Diabetes mellitus (HCC)   . Hypervolemia   . Stroke (HCC)   . Coronary artery disease involving coronary bypass graft of native heart without angina pectoris   . Hx of CABG   . Benign essential HTN   . Migraine without status migrainosus, not intractable   . Labile blood glucose   . Diabetes mellitus type 2 in obese (HCC)   . Morbid obesity (HCC)   . Tachypnea   . Hypoalbuminemia due to protein-calorie malnutrition (HCC)   . Leukocytosis   . Acute blood loss anemia   . Pressure injury of skin 08/12/2016  . Coronary artery disease involving native heart without angina pectoris   . 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 ACS (acute coronary syndrome) (HCC) 08/07/2016  . Hypertension 08/07/2016  . Diabetes mellitus with cardiac complication (HCC) 08/07/2016  . Non-STEMI (non-ST elevated myocardial infarction) (HCC) 08/07/2016  . NSTEMI (non-ST elevated myocardial infarction) (HCC) 08/07/2016    Sallyanne Kuster, PTA, [facility] [address] [place], [place] [address] [phone] 09/03/16, 9:34 PM   Name: [name] MRN: [id number] Date of Birth: May 01, 1971

## 2016-09-03 NOTE — Patient Instructions (Signed)
   Coordination Activities  Perform the following activities for 20 minutes 1 times per day with right hand(s).   Rotate ball in fingertips (clockwise and counter-clockwise).  Flip cards 1 at a time as fast as you can.  Deal cards with your thumb (Hold deck in hand and push card off top with thumb).  Pick up coins and stack.  Pick up coins one at a time until you get 5-10 in your hand, then move coins from palm to fingertips to stack one at a time.  Practice writing and/or typing.   1. Grip Strengthening (Resistive Putty)   Squeeze putty using thumb and all fingers. Repeat _20___ times. Do __2__ sessions per day.   2. Roll putty into tube on table and pinch between each finger and thumb x 10 reps each. (can do ring and small finger together)     Copyright  VHI. All rights reserved.

## 2016-09-03 NOTE — Therapy (Signed)
[facility] [address] [place], [place], [address] Phone: [phone]   Fax:  [phone]  Occupational Therapy Treatment  Patient Details  Name: [name] MRN: [id number] Date of Birth: [birth year] Referring Provider: Dr. Claudette Laws  Encounter Date: 09/03/2016      OT End of Session - 09/03/16 1545    Visit Number 2   Number of Visits 9   Date for OT Re-Evaluation 09/27/16   Authorization Type BC/BS   OT Start Time 1535   OT Stop Time 1615   OT Time Calculation (min) 40 min   Activity Tolerance Patient tolerated treatment well   Behavior During Therapy [facility] for tasks assessed/performed      Past Medical History:  Diagnosis Date  . Headache    due to elevated  BP  . History of kidney stones    "passed them"  . Hypertension   . Pancreatic injury    "damage to pancrease related to gallbladder OR"  . Pneumonia X 1  . Type II diabetes mellitus (HCC)     Past Surgical History:  Procedure Laterality Date  . CARDIAC CATHETERIZATION  08/07/2016  . CARDIAC CATHETERIZATION N/A 08/07/2016   Procedure: Left Heart Cath and Coronary Angiography;  Surgeon: Lyn Records, MD;  Location: [facility] INVASIVE CV LAB;  Service: Cardiovascular;  Laterality: N/A;  . CARDIAC CATHETERIZATION N/A 08/07/2016   Procedure: Intravascular Pressure Wire/FFR Study;  Surgeon: Lyn Records, MD;  [facility] CV LAB;  Service: Cardiovascular;  Laterality: N/A;  . CORONARY ARTERY BYPASS GRAFT N/A 08/09/2016   Procedure: CORONARY ARTERY BYPASS GRAFTING (CABG) x three,SVG to distal circ OM3, SVG to Om1, LIMA to LAD-using left internal mammary artery and right leg greater saphenous vein harvested endoscopically, Sternal plating - right side;  Surgeon: Delight Ovens, MD;  Location: [facility] OR;  Service: Open Heart Surgery;  Laterality: N/A;  . FACIAL RECONSTRUCTION SURGERY  2009   "facial fractures"  . FRACTURE SURGERY    .  LAPAROSCOPIC CHOLECYSTECTOMY    . TEE WITHOUT CARDIOVERSION N/A 08/09/2016   Procedure: TRANSESOPHAGEAL ECHOCARDIOGRAM (TEE);  Surgeon: Delight Ovens, MD;  Location: [facility] OR;  Service: Open Heart Surgery;  Laterality: N/A;  . TUBAL LIGATION      There were no vitals filed for this visit.      Subjective Assessment - 09/03/16 1540    Subjective  Pt denies pain   Limitations sternal precautions    Patient Stated Goals increase endurance and return to work   Currently in Pain? No/denies          Treatment: Pt was educated in coordination and red putty HEP for RUE. Therapist initiated MOCHA per evaluating therapist's plan. Pt reports performing at hospital. Pt scored 28/30 WNLS with ST at hospital. Organizing your day(complex problem #1):Pt completed correctly with no omissions, however she only allowed 30 mins for meeting a friend for lunch. Pt stated 30 mins would be adequate.                    OT Education - 09/03/16 1615    Education provided Yes   Education Details coordination and putty HEP   Person(s) Educated Patient;Parent(s)   Methods Explanation;Handout;Demonstration   Comprehension Verbalized understanding;Returned demonstration             OT Long Term Goals - 08/27/16 1217      OT LONG TERM GOAL #1   Title Independent with HEP for coordination  and strength RUE   Time 4   Period Weeks   Status New     OT LONG TERM GOAL #2   Title Improve coordination as evidenced by reducing speed on 9 hole peg test to 28 sec. or less   Baseline eval = 33.50 sec   Time 4   Period Weeks   Status New     OT LONG TERM GOAL #3   Title Improve grip strength Rt hand to 60 lbs or greater to open jars/containers   Baseline eval = 50 lbs (Lt = 65 lbs)    Time 4   Period Weeks   Status New     OT LONG TERM GOAL #4   Title Pt to verbalize understanding with memory compensatory strategies   Time 4   Period Weeks   Status New     OT LONG TERM GOAL #5    Title Pt to return to light IADLS including meal prep and light cleaning mod I level for 30 minute without rest   Time 4   Period Weeks   Status New     Long Term Additional Goals   Additional Long Term Goals Yes     OT LONG TERM GOAL #6   Title Pt to perform complex organizational and problem solving tasks with extra time and external aids prn, but no cueing at 90% or greater accuracy in prep for work related tasks   Time 4   Period Weeks   Status New               Plan - 09/03/16 1609    Clinical Impression Statement Pt is progressing towards goals for RUE strength and coordination.   Rehab Potential Good   OT Frequency 2x / week   OT Duration 4 weeks   OT Treatment/Interventions Self-care/ADL training;DME and/or AE instruction;Moist Heat;Fluidtherapy;Patient/family education;Therapeutic exercises;Therapeutic activities;Neuromuscular education;Functional Mobility Training;Passive range of motion;Cognitive remediation/compensation;Electrical Stimulation;Energy conservation;Manual Therapy;Visual/perceptual remediation/compensation   Plan continue to work towards long term goals   Consulted and Agree with Plan of Care Patient;Family member/caregiver      Patient will benefit from skilled therapeutic intervention in order to improve the following deficits and impairments:  Cardiopulmonary status limiting activity, Decreased coordination, Decreased endurance, Impaired sensation, Decreased activity tolerance, Decreased knowledge of precautions, Decreased knowledge of use of DME, Impaired UE functional use, Decreased cognition, Decreased mobility, Decreased strength, Impaired perceived functional ability  Visit Diagnosis: Other lack of coordination  Muscle weakness (generalized)  Other symptoms and signs involving cognitive functions following cerebral infarction  Hemiplegia and hemiparesis following cerebral infarction affecting right dominant side [facility])    Problem  List Patient Active Problem List   Diagnosis Date Noted  . Atypical chest pain   . Stroke due to embolism (HCC) 08/19/2016  . Diabetes mellitus (HCC)   . Hypervolemia   . Stroke (HCC)   . Coronary artery disease involving coronary bypass graft of native heart without angina pectoris   . Hx of CABG   . Benign essential HTN   . Migraine without status migrainosus, not intractable   . Labile blood glucose   . Diabetes mellitus type 2 in obese (HCC)   . Morbid obesity (HCC)   . Tachypnea   . Hypoalbuminemia due to protein-calorie malnutrition (HCC)   . Leukocytosis   . Acute blood loss anemia   . Pressure injury of skin 08/12/2016  . Coronary artery disease involving native heart without angina pectoris   . S/P CABG x 3  08/09/2016  . ACS (acute coronary syndrome) (HCC) 08/07/2016  . Hypertension 08/07/2016  . Diabetes mellitus with cardiac complication (HCC) 08/07/2016  . Non-STEMI (non-ST elevated myocardial infarction) (HCC) 08/07/2016  . NSTEMI (non-ST elevated myocardial infarction) (HCC) 08/07/2016    [name] 09/03/2016, 4:16 PM  [facility] [address] [place], [place], [address] Phone: [phone]   Fax:  [phone]  Name: [name] MRN: [id number] Date of Birth: 08/03/1970

## 2016-09-03 NOTE — Patient Instructions (Addendum)
Functional Quadriceps: Sit to Stand    Sit on edge of chair, feet flat on floor. Stand upright, extending knees fully. Do not use arms. Repeat _10___ times per set. Do _1_ sets per session. Do _1-2_ sessions per day.  http://orth.exer.us/735   Copyright  VHI. All rights reserved.   Single Leg - Eyes Open    Holding kitchen counter top, lift left leg while maintaining balance over right leg. Progress to removing hands from support surface for longer periods of time. Hold__10__ seconds. Repeat _3___ times per session. Do _1-2_ sessions per day.  Copyright  VHI. All rights reserved.    Tandem Walking    Holding kitchen counter top: Walk a straight line forward placing each foot directly in front of other one and then walk a straight line backwards by placing one foot directly behind the other one.  Repeat for 3 laps each way. 1-2 times a day.  Copyright  VHI. All rights reserved.

## 2016-09-04 ENCOUNTER — Encounter
Payer: BLUE CROSS/BLUE SHIELD | Attending: Physical Medicine & Rehabilitation | Admitting: Physical Medicine & Rehabilitation

## 2016-09-04 ENCOUNTER — Encounter: Payer: Self-pay | Admitting: Physical Medicine & Rehabilitation

## 2016-09-04 VITALS — BP 155/94 | HR 90 | Temp 97.9°F | Resp 14

## 2016-09-04 DIAGNOSIS — Z8249 Family history of ischemic heart disease and other diseases of the circulatory system: Secondary | ICD-10-CM | POA: Diagnosis not present

## 2016-09-04 DIAGNOSIS — Z87891 Personal history of nicotine dependence: Secondary | ICD-10-CM | POA: Insufficient documentation

## 2016-09-04 DIAGNOSIS — R531 Weakness: Secondary | ICD-10-CM | POA: Insufficient documentation

## 2016-09-04 DIAGNOSIS — Z87442 Personal history of urinary calculi: Secondary | ICD-10-CM | POA: Insufficient documentation

## 2016-09-04 DIAGNOSIS — R51 Headache: Secondary | ICD-10-CM | POA: Diagnosis not present

## 2016-09-04 DIAGNOSIS — R269 Unspecified abnormalities of gait and mobility: Secondary | ICD-10-CM

## 2016-09-04 DIAGNOSIS — Z951 Presence of aortocoronary bypass graft: Secondary | ICD-10-CM | POA: Diagnosis not present

## 2016-09-04 DIAGNOSIS — E1169 Type 2 diabetes mellitus with other specified complication: Secondary | ICD-10-CM

## 2016-09-04 DIAGNOSIS — Z9851 Tubal ligation status: Secondary | ICD-10-CM | POA: Insufficient documentation

## 2016-09-04 DIAGNOSIS — I25810 Atherosclerosis of coronary artery bypass graft(s) without angina pectoris: Secondary | ICD-10-CM

## 2016-09-04 DIAGNOSIS — E669 Obesity, unspecified: Secondary | ICD-10-CM | POA: Insufficient documentation

## 2016-09-04 DIAGNOSIS — Z833 Family history of diabetes mellitus: Secondary | ICD-10-CM | POA: Insufficient documentation

## 2016-09-04 DIAGNOSIS — E119 Type 2 diabetes mellitus without complications: Secondary | ICD-10-CM

## 2016-09-04 DIAGNOSIS — E1159 Type 2 diabetes mellitus with other circulatory complications: Secondary | ICD-10-CM | POA: Diagnosis not present

## 2016-09-04 DIAGNOSIS — Z9049 Acquired absence of other specified parts of digestive tract: Secondary | ICD-10-CM | POA: Diagnosis not present

## 2016-09-04 DIAGNOSIS — I10 Essential (primary) hypertension: Secondary | ICD-10-CM | POA: Diagnosis not present

## 2016-09-04 DIAGNOSIS — I6312 Cerebral infarction due to embolism of basilar artery: Secondary | ICD-10-CM

## 2016-09-04 NOTE — Progress Notes (Addendum)
Subjective:    Patient ID: [name], female    DOB: Nov 09, 1970, 46 y.o.   MRN: [id number]  HPI 46 y.o. female with history of HTN, headaches, T2DM--medication non-comliance presents for transitional care management after receiving CIR for multiple small vessel infarcts.   Admit date: 08/19/2016 Discharge date: 08/23/2016  At discharge, she was instructed to follow up with PCP, which she did and insulin adjustments were made.  She has an appointment with CT Surg. She has not scheduled appointment with Neurology or Cardiology.  She is maintaining sternal precautions. BP elevated in office.   DME: None Therapies: 2/week Mobility: Pt not using cane, instructed to do so  Pain Inventory Average Pain no pain Pain Right Now no pain My pain is no pain  In the last 24 hours, has pain interfered with the following? General activity no pain Relation with others no pain Enjoyment of life No pain What TIME of day is your pain at its worst? No pain Sleep (in general) No pain  Pain is worse with: No pain Pain improves with: no pain Relief from Meds: no pain  Mobility walk without assistance walk with assistance ability to climb steps?  yes  Function I need assistance with the following:  meal prep, household duties and shopping  Neuro/Psych weakness numbness tingling trouble walking anxiety  Prior Studies Any changes since last visit?  no  Physicians involved in your care Any changes since last visit?  no   Family History  Problem Relation Age of Onset  . Hypertension Mother   . Diabetes Mother   . Hypertension Father   . Diabetes Father   . Heart attack Father 23    had CABG   Social History   Social History  . Marital status: Married    Spouse name: N/A  . Number of children: N/A  . Years of education: N/A   Occupational History  . Supervisor [employer]   Social History Main Topics  . Smoking status: Former Smoker    Packs/day: 0.50    Years:  20.00    Types: Cigarettes  . Smokeless tobacco: Never Used  . Alcohol use No  . Drug use: No  . Sexual activity: Yes    Birth control/ protection: None   Other Topics Concern  . None   Social History Narrative   Lives with husband and daughter   Past Surgical History:  Procedure Laterality Date  . CARDIAC CATHETERIZATION  08/07/2016  . CARDIAC CATHETERIZATION N/A 08/07/2016   Procedure: Left Heart Cath and Coronary Angiography;  Surgeon: Lyn Records, MD;  Location: [facility] INVASIVE CV LAB;  Service: Cardiovascular;  Laterality: N/A;  . CARDIAC CATHETERIZATION N/A 08/07/2016   Procedure: Intravascular Pressure Wire/FFR Study;  Surgeon: Lyn Records, MD;  Location: [facility] INVASIVE CV LAB;  Service: Cardiovascular;  Laterality: N/A;  . CORONARY ARTERY BYPASS GRAFT N/A 08/09/2016   Procedure: CORONARY ARTERY BYPASS GRAFTING (CABG) x three,SVG to distal circ OM3, SVG to Om1, LIMA to LAD-using left internal mammary artery and right leg greater saphenous vein harvested endoscopically, Sternal plating - right side;  Surgeon: Delight Ovens, MD;  Location: [facility] OR;  Service: Open Heart Surgery;  Laterality: N/A;  . FACIAL RECONSTRUCTION SURGERY  2009   "facial fractures"  . FRACTURE SURGERY    . LAPAROSCOPIC CHOLECYSTECTOMY    . TEE WITHOUT CARDIOVERSION N/A 08/09/2016   Procedure: TRANSESOPHAGEAL ECHOCARDIOGRAM (TEE);  Surgeon: Delight Ovens, MD;  Location: [facility] OR;  Service: Open Heart  Surgery;  Laterality: N/A;  . TUBAL LIGATION     Past Medical History:  Diagnosis Date  . Headache    due to elevated  BP  . History of kidney stones    "passed them"  . Hypertension   . Pancreatic injury    "damage to pancrease related to gallbladder OR"  . Pneumonia X 1  . Type II diabetes mellitus (HCC)    BP (!) 155/94 (BP Location: Left Arm, Patient Position: Sitting, Cuff Size: Normal)   Pulse 90   Temp 97.9 F (36.6 C) (Oral)   Resp 14   SpO2 95%   Opioid Risk Score:   Fall Risk Score:   `1  Depression screen PHQ 2/9  Depression screen PHQ 2/9 09/04/2016  Decreased Interest 1  Down, Depressed, Hopeless 3  PHQ - 2 Score 4  Altered sleeping 3  Tired, decreased energy 1  Change in appetite 2  Feeling bad or failure about yourself  3  Trouble concentrating 1  Moving slowly or fidgety/restless 2  Suicidal thoughts 0  PHQ-9 Score 16     Review of Systems  Constitutional: Positive for appetite change.       Night sweats  HENT: Negative.   Eyes: Negative.   Respiratory: Positive for shortness of breath.   Cardiovascular: Negative.   Gastrointestinal: Negative.   Endocrine:       High blood sugar  Genitourinary: Negative.   Musculoskeletal: Positive for gait problem.  Skin: Negative.   Allergic/Immunologic: Negative.   Neurological: Positive for weakness and numbness.  Hematological: Negative.   Psychiatric/Behavioral: Negative.   All other systems reviewed and are negative.     Objective:   Physical Exam HEENT: Normocephalic, atraumatic Cardio: RRR. No JVD. Resp: CTA B/L and Unlabored GI: BS positive and ND Skin:   Wound c/d/i midline sternotomy. Neuro: Alert. Motor: LLE: HF 4/5, KE 4/5, ADF/PF 4/5 LUE: 4/5 proximal to distal RLE: HF 4+/5, KE 4+/5, ADF/PF 4+/5 RUE: 4+/5 proximal to distal  Musc/Skel:  No edema, no tenderness  Gait: Short stride length left, mild steppage Gen NAD. Vital signs reviewed.     Assessment & Plan:  46 y.o. female with history of HTN, headaches, T2DM--medication non-comliance presents for transitional care management after receiving CIR for multiple small vessel infarcts.   1.  Weakness, poor activity tolerance, abnormality of gait secondary to multiple small vessel infarcts.  Cont therapies  Follow up with Neurology  2. CAD s/p CABG  Cont meds  Follow up CT Surg  Follow up Cardiology  Cont sternal precautions  3. HTN  Cont meds  Elevated in office and in therapies  Follow up with PCP, Cards for  adjustments  4.T2DM  Improved with recent adjustments   Adjustments being made by PCP  5. Gait abnormality  Cont therapies  Encouraged use of cane for balance  [place]  Meds reviewed Referals reviewed, needs to schedule appt with Cardiology and Neurology All questions answered

## 2016-09-04 NOTE — Patient Instructions (Signed)
Please schedule follow up appointment with Neurology, Dr. Roda ShuttersXu  Please schedule follow up appointment with Cardiology, Dr. Jens Somrenshaw

## 2016-09-05 ENCOUNTER — Encounter: Payer: BLUE CROSS/BLUE SHIELD | Admitting: Physical Medicine & Rehabilitation

## 2016-09-05 ENCOUNTER — Telehealth: Payer: Self-pay

## 2016-09-05 NOTE — Telephone Encounter (Signed)
Patient has been notified of letter.

## 2016-09-06 ENCOUNTER — Ambulatory Visit: Payer: BLUE CROSS/BLUE SHIELD | Admitting: Physical Therapy

## 2016-09-06 VITALS — BP 177/100 | HR 65

## 2016-09-06 DIAGNOSIS — I69351 Hemiplegia and hemiparesis following cerebral infarction affecting right dominant side: Secondary | ICD-10-CM

## 2016-09-06 DIAGNOSIS — R2689 Other abnormalities of gait and mobility: Secondary | ICD-10-CM

## 2016-09-06 DIAGNOSIS — M6281 Muscle weakness (generalized): Secondary | ICD-10-CM

## 2016-09-06 NOTE — Therapy (Signed)
[facility] [address] [place], [place], [address] Phone: [phone]   Fax:  [phone]  Physical Therapy Visit:No Charge  Patient Details  Name: [name] MRN: [id number] Date of Birth: 06-17-1971 Referring Provider: Erick Colace, MD  Encounter Date: 09/06/2016      PT End of Session - 09/06/16 1627    Activity Tolerance Treatment limited secondary to medical complications (Comment)  unable to treat today      Past Medical History:  Diagnosis Date  . Headache    due to elevated  BP  . History of kidney stones    "passed them"  . Hypertension   . Pancreatic injury    "damage to pancrease related to gallbladder OR"  . Pneumonia X 1  . Type II diabetes mellitus (HCC)     Past Surgical History:  Procedure Laterality Date  . CARDIAC CATHETERIZATION  08/07/2016  . CARDIAC CATHETERIZATION N/A 08/07/2016   Procedure: Left Heart Cath and Coronary Angiography;  Surgeon: Lyn Records, MD;  Location: [facility] INVASIVE CV LAB;  Service: Cardiovascular;  Laterality: N/A;  . CARDIAC CATHETERIZATION N/A 08/07/2016   Procedure: Intravascular Pressure Wire/FFR Study;  Surgeon: Lyn Records, MD;  Location: [facility] INVASIVE CV LAB;  Service: Cardiovascular;  Laterality: N/A;  . CORONARY ARTERY BYPASS GRAFT N/A 08/09/2016   Procedure: CORONARY ARTERY BYPASS GRAFTING (CABG) x three,SVG to distal circ OM3, SVG to Om1, LIMA to LAD-using left internal mammary artery and right leg greater saphenous vein harvested endoscopically, Sternal plating - right side;  Surgeon: Delight Ovens, MD;  Location: [facility] OR;  Service: Open Heart Surgery;  Laterality: N/A;  . FACIAL RECONSTRUCTION SURGERY  2009   "facial fractures"  . FRACTURE SURGERY    . LAPAROSCOPIC CHOLECYSTECTOMY    . TEE WITHOUT CARDIOVERSION N/A 08/09/2016   Procedure: TRANSESOPHAGEAL ECHOCARDIOGRAM (TEE);  Surgeon: Delight Ovens, MD;  Location: [facility] OR;  Service:  Open Heart Surgery;  Laterality: N/A;  . TUBAL LIGATION      Vitals:   09/06/16 1554 09/06/16 1556  BP: (!) 177/105 (!) 177/100  Pulse: 65 65        Subjective Assessment - 09/06/16 1622    Subjective Pt has been out all day with husband and doing a lot of walking; reporting chest pain across middle of chest; denies radiating pain to LUE, shoulder, back.  Denies headache.     Patient is accompained by: Family member   Pertinent History Hold exercise if BP goes above 160/100 per cardiologist recommendations.   Limitations Lifting   Patient Stated Goals "Be back to normal and be able to do more for myself" Would like to be more independent   Currently in Pain? Yes   Pain Score 5    Pain Location Chest   Pain Orientation Proximal   Pain Descriptors / Indicators Sore   Pain Type Acute pain           PT Education - 09/06/16 1627    Education Details Advised pt to re-check BP later this evening; also advised pt to contact MD about chest pain and elevated BP   Person(s) Educated Patient;Spouse   Methods Explanation   Comprehension Verbalized understanding          PT Short Term Goals - 09/03/16 2132      PT SHORT TERM GOAL #1   Title TARGET FOR ALL STG 09/25/2016-Pt will initiate HEP and demonstrate independence with performing    Status On-going  PT SHORT TERM GOAL #2   Title Pt will participate in 6 minute walk test of endurance with LTG to be set.   Baseline 09/03/16: 969 feet with no AD or rest breaks taken   Status Achieved     PT SHORT TERM GOAL #3   Title Pt will participate in Functional Gait Assessment (FGA) and LTG to be set.   Baseline 09/03/16: 18/30 for baseline values   Status Achieved     PT SHORT TERM GOAL #4   Title Pt will negotiate 8 stairs with 2 rails alternating sequence supervision with no UE support   Status On-going     PT SHORT TERM GOAL #5   Title Pt will demonstrate lowered fall risk as indicated by a BERG balance score of 55/56.    Status On-going           PT Long Term Goals - 08/27/16 1041      PT LONG TERM GOAL #1   Title TARGET DATE FOR ALL LTG 10/26/16: Pt will improve RLE strength to 5/5 overall   Status New     PT LONG TERM GOAL #2   Title Pt will improve FGA score by 4.2 points to demonstrate decreased falls risk during community ambulation.   Status New     PT LONG TERM GOAL #3   Title Pt will improve gait velocity to >913ft/sec for increased independence with community gait.     PT LONG TERM GOAL #4   Title Pt will improve 6 minute by 15312ft to demonstrate improvement in functional endurance.   Status New     PT LONG TERM GOAL #5   Title Pt will negotiate >500 over level and uneven surfaces (indoor/outdoor) and will negotiate 10 stairs at mod I level    Status New               Plan - 09/06/16 1627    Clinical Impression Statement Unable to treat today due to elevated BP-taken with digital and manual BP-and chest pain.     Clinical Impairments Affecting Rehab Potential Cardipulmonary limitations   PT Treatment/Interventions ADLs/Self Care Home Management;Moist Heat;DME Instruction;Gait training;Stair training;Functional mobility training;Therapeutic activities;Therapeutic exercise;Balance training;Neuromuscular re-education;Patient/family education;Energy conservation;Taping   PT Next Visit Plan MONITOR VITALS at every visit; review HEP and add to it as indicated; continued to work on LE strengthening, high level balance (on compliant surfaces and single leg stance) and activity tolerance.      Consulted and Agree with Plan of Care Family member/caregiver;Patient   Family Member Consulted Husband-[name]      Patient will benefit from skilled therapeutic intervention in order to improve the following deficits and impairments:  Abnormal gait, Cardiopulmonary status limiting activity, Decreased endurance, Decreased balance, Decreased coordination, Decreased strength, Difficulty walking,  Impaired sensation  Visit Diagnosis: Muscle weakness (generalized)  Hemiplegia and hemiparesis following cerebral infarction affecting right dominant side (HCC)  Other abnormalities of gait and mobility     Problem List Patient Active Problem List   Diagnosis Date Noted  . Atypical chest pain   . Stroke due to embolism (HCC) 08/19/2016  . Diabetes mellitus (HCC)   . Hypervolemia   . Stroke (HCC)   . Coronary artery disease involving coronary bypass graft of native heart without angina pectoris   . Hx of CABG   . Benign essential HTN   . Migraine without status migrainosus, not intractable   . Labile blood glucose   . Diabetes mellitus type 2 in obese (HCC)   .  Morbid obesity (HCC)   . Tachypnea   . Hypoalbuminemia due to protein-calorie malnutrition (HCC)   . Leukocytosis   . Acute blood loss anemia   . Pressure injury of skin 08/12/2016  . Coronary artery disease involving native heart without angina pectoris   . 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 ACS (acute coronary syndrome) (HCC) 08/07/2016  . Hypertension 08/07/2016  . Diabetes mellitus with cardiac complication (HCC) 08/07/2016  . Non-STEMI (non-ST elevated myocardial infarction) (HCC) 08/07/2016  . NSTEMI (non-ST elevated myocardial infarction) (HCC) 08/07/2016   Edman Circle, PT, DPT 09/06/16    4:37 PM    [facility] [address] [place], [place], [address] Phone: [phone]   Fax:  [phone]  Name: [name] MRN: [id number] Date of Birth: Apr 08, 1971

## 2016-09-06 NOTE — Telephone Encounter (Signed)
Do not think the chest pain is cardiac. This was checked out in the hospital by cardiology and surgery. Her blood pressure needs to be managed by primary care.

## 2016-09-06 NOTE — Telephone Encounter (Signed)
FYI: pt presented to PT treatment session today with pain/soreness in sternum as well as resting BP of 177/105.  Pt denied clicking, popping, sharp pain, SOB, headache, radiating pain into shoulder, UE, or back.  PT did not treat today.  Thanks, Edman CircleAudra Hall, PT, DPT 09/06/16    4:59 PM

## 2016-09-10 ENCOUNTER — Encounter: Payer: Self-pay | Admitting: Physical Therapy

## 2016-09-10 ENCOUNTER — Ambulatory Visit: Payer: BLUE CROSS/BLUE SHIELD | Admitting: Occupational Therapy

## 2016-09-10 ENCOUNTER — Ambulatory Visit: Payer: BLUE CROSS/BLUE SHIELD | Admitting: Physical Therapy

## 2016-09-10 VITALS — BP 166/94 | HR 75

## 2016-09-10 DIAGNOSIS — M6281 Muscle weakness (generalized): Secondary | ICD-10-CM

## 2016-09-10 DIAGNOSIS — R278 Other lack of coordination: Secondary | ICD-10-CM

## 2016-09-10 DIAGNOSIS — I69351 Hemiplegia and hemiparesis following cerebral infarction affecting right dominant side: Secondary | ICD-10-CM

## 2016-09-10 DIAGNOSIS — R2689 Other abnormalities of gait and mobility: Secondary | ICD-10-CM

## 2016-09-10 NOTE — Therapy (Signed)
[facility] [address] [place], [place], [address] Phone: [phone]   Fax:  [phone]  Physical Therapy Treatment  Patient Details  Name: [name] MRN: [id number] Date of Birth: 06/28/1971 Referring Provider: Erick ColaceAndrew E Kirsteins, MD  Encounter Date: 09/10/2016      PT End of Session - 09/10/16 1458    Visit Number 3   Number of Visits 17   Date for PT Re-Evaluation 10/26/16   Authorization Type BCBS   PT Start Time 1448   PT Stop Time 1504  no charge, treatment not done to elevated BP   PT Time Calculation (min) 16 min   Activity Tolerance Patient tolerated treatment well;No increased pain   Behavior During Therapy WFL for tasks assessed/performed      Past Medical History:  Diagnosis Date  . Headache    due to elevated  BP  . History of kidney stones    "passed them"  . Hypertension   . Pancreatic injury    "damage to pancrease related to gallbladder OR"  . Pneumonia X 1  . Type II diabetes mellitus (HCC)     Past Surgical History:  Procedure Laterality Date  . CARDIAC CATHETERIZATION  08/07/2016  . CARDIAC CATHETERIZATION N/A 08/07/2016   Procedure: Left Heart Cath and Coronary Angiography;  Surgeon: Lyn RecordsHenry W Smith, MD;  [facility];  Service: Cardiovascular;  Laterality: N/A;  . CARDIAC CATHETERIZATION N/A 08/07/2016   Procedure: Intravascular Pressure Wire/FFR Study;  Surgeon: Lyn RecordsHenry W Smith, MD;  [facility];  Service: Cardiovascular;  Laterality: N/A;  . CORONARY ARTERY BYPASS GRAFT N/A 08/09/2016   Procedure: CORONARY ARTERY BYPASS GRAFTING (CABG) x three,SVG to distal circ OM3, SVG to Om1, LIMA to LAD-using left internal mammary artery and right leg greater saphenous vein harvested endoscopically, Sternal plating - right side;  Surgeon: Delight OvensEdward B Gerhardt, MD;  [facility];  Service: Open Heart Surgery;  Laterality: N/A;  . FACIAL RECONSTRUCTION  SURGERY  2009   "facial fractures"  . FRACTURE SURGERY    . LAPAROSCOPIC CHOLECYSTECTOMY    . TEE WITHOUT CARDIOVERSION N/A 08/09/2016   Procedure: TRANSESOPHAGEAL ECHOCARDIOGRAM (TEE);  Surgeon: Delight OvensEdward B Gerhardt, MD;  [facility];  Service: Open Heart Surgery;  Laterality: N/A;  . TUBAL LIGATION      Vitals:   09/10/16 1452  BP: (!) 166/94  Pulse: 75        Subjective Assessment - 09/10/16 1451    Subjective Reports some chest incision pain today, mild right now.    Patient is accompained by: Family member   Pertinent History Hold exercise if BP goes above 160/100 per cardiologist recommendations.   Limitations Lifting   Patient Stated Goals "Be back to normal and be able to do more for myself" Would like to be more independent   Currently in Pain? Yes             PT Short Term Goals - 09/03/16 2132      PT SHORT TERM GOAL #1   Title TARGET FOR ALL STG 09/25/2016-Pt will initiate HEP and demonstrate independence with performing    Status On-going     PT SHORT TERM GOAL #2   Title Pt will participate in 6 minute walk test of endurance with LTG to be set.   Baseline 09/03/16: 969 feet with no AD or rest breaks taken   Status Achieved     PT SHORT TERM GOAL #3  Title Pt will participate in Functional Gait Assessment (FGA) and LTG to be set.   Baseline 09/03/16: 18/30 for baseline values   Status Achieved     PT SHORT TERM GOAL #4   Title Pt will negotiate 8 stairs with 2 rails alternating sequence supervision with no UE support   Status On-going     PT SHORT TERM GOAL #5   Title Pt will demonstrate lowered fall risk as indicated by a BERG balance score of 55/56.   Status On-going           PT Long Term Goals - 08/27/16 1041      PT LONG TERM GOAL #1   Title TARGET DATE FOR ALL LTG 10/26/16: Pt will improve RLE strength to 5/5 overall   Status New     PT LONG TERM GOAL #2   Title Pt will improve FGA score by 4.2 points to demonstrate decreased falls  risk during community ambulation.   Status New     PT LONG TERM GOAL #3   Title Pt will improve gait velocity to >20ft/sec for increased independence with community gait.     PT LONG TERM GOAL #4   Title Pt will improve 6 minute by 164ft to demonstrate improvement in functional endurance.   Status New     PT LONG TERM GOAL #5   Title Pt will negotiate >500 over level and uneven surfaces (indoor/outdoor) and will negotiate 10 stairs at mod I level    Status New               Plan - 09/10/16 1458    Clinical Impression Statement Treatment held today due to BP on arrival above precaution levels established by MD. Pt to call MD reguarding BP continuing to be elevated. Pt should benefit from continued PT to progress toward unmet goals once BP lower/more stable.   Clinical Impairments Affecting Rehab Potential Cardipulmonary limitations   PT Treatment/Interventions ADLs/Self Care Home Management;Moist Heat;DME Instruction;Gait training;Stair training;Functional mobility training;Therapeutic activities;Therapeutic exercise;Balance training;Neuromuscular re-education;Patient/family education;Energy conservation;Taping   PT Next Visit Plan MONITOR VITALS at every visit; review HEP and add to it as indicated; continued to work on LE strengthening, high level balance (on compliant surfaces and single leg stance) and activity tolerance.      Consulted and Agree with Plan of Care Family member/caregiver;Patient   Family Member Consulted Husband-[name]      Patient will benefit from skilled therapeutic intervention in order to improve the following deficits and impairments:  Abnormal gait, Cardiopulmonary status limiting activity, Decreased endurance, Decreased balance, Decreased coordination, Decreased strength, Difficulty walking, Impaired sensation  Visit Diagnosis: Muscle weakness (generalized)  Hemiplegia and hemiparesis following cerebral infarction affecting right dominant side  (HCC)  Other abnormalities of gait and mobility     Problem List Patient Active Problem List   Diagnosis Date Noted  . Atypical chest pain   . Stroke due to embolism (HCC) 08/19/2016  . Diabetes mellitus (HCC)   . Hypervolemia   . Stroke (HCC)   . Coronary artery disease involving coronary bypass graft of native heart without angina pectoris   . Hx of CABG   . Benign essential HTN   . Migraine without status migrainosus, not intractable   . Labile blood glucose   . Diabetes mellitus type 2 in obese (HCC)   . Morbid obesity (HCC)   . Tachypnea   . Hypoalbuminemia due to protein-calorie malnutrition (HCC)   . Leukocytosis   . Acute blood loss  anemia   . Pressure injury of skin 08/12/2016  . Coronary artery disease involving native heart without angina pectoris   . 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 ACS (acute coronary syndrome) (HCC) 08/07/2016  . Hypertension 08/07/2016  . Diabetes mellitus with cardiac complication (HCC) 08/07/2016  . Non-STEMI (non-ST elevated myocardial infarction) (HCC) 08/07/2016  . NSTEMI (non-ST elevated myocardial infarction) (HCC) 08/07/2016    Sallyanne Kuster, PTA, [facility] [address], [address] [place], [place] [address] [phone] 09/10/16, 3:07 PM   Name: Khalidah Herbold MRN: [id number] Date of Birth: May 06, 1971

## 2016-09-10 NOTE — Therapy (Signed)
[facility] [address] [place], [place], [address] Phone: [phone]   Fax:  [phone]  Patient Details  Name: [name] MRN: [id number] Date of Birth: 09/04/1970 Referring Provider:  Erick ColaceKirsteins, Andrew E, MD  Encounter Date: 09/10/2016 OT treatment witheld. Pt left PT session with elevated BP.  RINE,KATHRYN 09/10/2016, 5:04 PM  [facility] [address] [place], [place], [address] Phone: [phone]   Fax:  [phone]

## 2016-09-11 ENCOUNTER — Telehealth: Payer: Self-pay | Admitting: Cardiology

## 2016-09-11 NOTE — Progress Notes (Signed)
Cardiology Office Note   Date:  09/13/2016   ID:  [name], DOB October 03, 1970, MRN [id number]  PCP:  No PCP Per Patient  Cardiologist:   Minus Breeding, MD    Chief Complaint  Patient presents with  . Coronary Artery Disease      History of Present Illness: [name] is a 46 y.o. female who presents for follow up after CABG.  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  She went to therapy afterward.  She is home now and doing physical therapy but is been unable to go to the last couple of sessions because of hypertension. She has some residual right arm and leg weakness but otherwise feels pretty good. She has some sternal chest discomfort but it's mild. She's had no substernal discomfort. She's had no fevers, chills. She denies any nausea vomiting or diaphoresis. She's had no palpitations, presyncope or syncope. She's had a little pain in her right leg at the harvest site   Past Medical History:  Diagnosis Date  . Headache    due to elevated  BP  . History of kidney stones    "passed them"  . Hypertension   . Pancreatic injury    "damage to pancrease related to gallbladder OR"  . Pneumonia X 1  . Type II diabetes mellitus ([place])     Past Surgical History:  Procedure Laterality Date  . CARDIAC CATHETERIZATION  08/07/2016  . CARDIAC CATHETERIZATION N/A 08/07/2016   Procedure: Left Heart Cath and Coronary Angiography;  Surgeon: Belva Crome, MD;  Location: [place] [facility];  Service: Cardiovascular;  Laterality: N/A;  . CARDIAC CATHETERIZATION N/A 08/07/2016   Procedure: Intravascular Pressure Wire/FFR Study;  Surgeon: Belva Crome, MD;  Location: [place] [facility];  Service: Cardiovascular;  Laterality: N/A;  . CORONARY ARTERY BYPASS GRAFT N/A 08/09/2016   Procedure: CORONARY ARTERY BYPASS GRAFTING (CABG) x three,SVG to distal circ OM3, SVG to Om1, LIMA to LAD-using left internal mammary artery and right leg greater saphenous vein harvested  endoscopically, Sternal plating - right side;  Surgeon: Grace Isaac, MD;  Location: [place];  Service: Open Heart Surgery;  Laterality: N/A;  . FACIAL RECONSTRUCTION SURGERY  2009   "facial fractures"  . FRACTURE SURGERY    . LAPAROSCOPIC CHOLECYSTECTOMY    . TEE WITHOUT CARDIOVERSION N/A 08/09/2016   Procedure: TRANSESOPHAGEAL ECHOCARDIOGRAM (TEE);  Surgeon: Grace Isaac, MD;  Location: [place];  Service: Open Heart Surgery;  Laterality: N/A;  . TUBAL LIGATION       Current Outpatient Prescriptions  Medication Sig Dispense Refill  . aspirin EC 325 MG EC tablet Take 1 tablet (325 mg total) by mouth daily. 30 tablet 0  . atorvastatin (LIPITOR) 80 MG tablet Take 1 tablet (80 mg total) by mouth daily at 6 PM. 30 tablet 0  . B-D UF III MINI PEN NEEDLES 31G X 5 MM MISC     . blood glucose meter kit and supplies KIT Dispense based on patient and insurance preference. Use up to four times daily as directed. (FOR ICD-9 250.00, 250.01). 1 each 0  . clopidogrel (PLAVIX) 75 MG tablet Take 1 tablet (75 mg total) by mouth daily. 30 tablet 0  . Insulin Detemir (LEVEMIR) 100 UNIT/ML Pen Inject 30 Units into the skin daily after breakfast. (Patient taking differently: Inject 30 Units into the skin Nightly. ) 15 mL 0  . Insulin Pen Needle (CLICKFINE PEN NEEDLES) 31G X 8 MM  MISC 1 application by Does not apply route as directed. 100 each 0  . LEVEMIR 100 UNIT/ML injection     . metoprolol tartrate (LOPRESSOR) 25 MG tablet Take 1 tablet (25 mg total) by mouth 2 (two) times daily. 60 tablet 0  . nitroGLYCERIN (NITROSTAT) 0.4 MG SL tablet Place 1 tablet (0.4 mg total) under the tongue every 5 (five) minutes as needed for chest pain. Max 3 doses--call MD if you need to use 2 or more pills 30 tablet 0  . NOVOLOG 100 UNIT/ML injection     . ONETOUCH VERIO test strip     . pantoprazole (PROTONIX) 40 MG tablet Take 1 tablet (40 mg total) by mouth daily before breakfast. 30 tablet 0  . senna-docusate  (SENOKOT-S) 8.6-50 MG tablet Take 1 tablet by mouth at bedtime. 30 tablet 0  . traMADol (ULTRAM) 50 MG tablet Take 1 tablet (50 mg total) by mouth every 12 (twelve) hours as needed for moderate pain. 15 tablet 0  . furosemide (LASIX) 40 MG tablet Take 1 tablet (40 mg total) by mouth daily. 30 tablet 0  . lisinopril (PRINIVIL,ZESTRIL) 20 MG tablet Take 1 tablet (20 mg total) by mouth daily. 90 tablet 3   No current facility-administered medications for this visit.     Allergies:   Eugenol; Hydrochlorothiazide; Morphine and related; Percocet [oxycodone-acetaminophen]; Shellfish allergy; Sulfa antibiotics; and Tomato    ROS:  Please see the history of present illness.   Otherwise, review of systems are positive for none.   All other systems are reviewed and negative.    PHYSICAL EXAM: VS:  BP (!) 182/103   Pulse 66   Ht 4' 11.5" (1.511 m)   Wt 171 lb 3.2 oz (77.7 kg)   SpO2 99%   BMI 34.00 kg/m  , BMI Body mass index is 34 kg/m. GENERAL:  Well appearing NECK:  No jugular venous distention, waveform within normal limits, carotid upstroke brisk and symmetric, no bruits, no thyromegaly CHEST:  Well healed sternotomy scar LUNGS:  Clear to auscultation bilaterally BACK:  No CVA tenderness CHEST:  Unremarkable HEART:  PMI not displaced or sustained,S1 and S2 within normal limits, no S3, no S4, no clicks, no rubs, no murmurs ABD:  Flat, positive bowel sounds normal in frequency in pitch, no bruits, no rebound, no guarding, no midline pulsatile mass, no hepatomegaly, no splenomegaly EXT:  2 plus pulses throughout, no edema, no cyanosis no clubbing, trace edema   EKG:  EKG is not ordered today.   Recent Labs: 08/10/2016: Magnesium 2.3 08/20/2016: ALT 28; Hemoglobin 10.1; Platelets 670 08/22/2016: BUN 16; Creatinine, Ser 0.59; Potassium 3.6; Sodium 139    Lipid Panel    Component Value Date/Time   CHOL 120 08/08/2016 0304   TRIG 89 08/08/2016 0304   HDL 35 (L) 08/08/2016 0304    CHOLHDL 3.4 08/08/2016 0304   VLDL 18 08/08/2016 0304   LDLCALC 67 08/08/2016 0304      Wt Readings from Last 3 Encounters:  09/12/16 171 lb 3.2 oz (77.7 kg)  08/23/16 170 lb (77.1 kg)  08/19/16 170 lb 14.4 oz (77.5 kg)      Other studies Reviewed: Additional studies/ records that were reviewed today include: None. Review of the above records demonstrates:  Please see elsewhere in the note.     ASSESSMENT AND PLAN:  CAD:   The patient has no new sypmtoms.  No further cardiovascular testing is indicated.  We will continue with aggressive risk reduction and meds  as listed. Eventually get her into cardiac rehabilitation.  DIABETES:   She needs a PCP to follow this.    EMBOLIC CVA:   She is going to continue physical therapy. For now I'll continue aspirin and Plavix.  HTN:    Her blood pressure is not well controlled. I did increase her lisinopril 20 mg daily. She'll take an extra 15 mg today. I'm going to have her come back to see our PA in about a week to consider next med titration which might be increasing her beta blocker.  She can have a basic metabolic profile when she returns for follow-up.   Current medicines are reviewed at length with the patient today.  The patient does not have concerns regarding medicines.  The following changes have been made:  no change  Labs/ tests ordered today include: None No orders of the defined types were placed in this encounter.    Disposition:   FU with APP in two weeks.     Signed, Minus Breeding, MD  09/13/2016 7:33 AM    [facility]

## 2016-09-11 NOTE — Telephone Encounter (Signed)
Returned call to patient she stated she will keep post hospital appointment with Dr.Hochrein tomorrow at 9:30 am.Advised to bring all medications to appointment.

## 2016-09-11 NOTE — Telephone Encounter (Signed)
New message     Pt calling to check on the time of her appt for tomorrow. She was told her appt was at 9:15am with Dr. Antoine PocheHochrein. Pt stated that her appt was supposed to be with Dr. Jens Somrenshaw because he was the doctor that performed her surgery. I told pt that she may have been assigned to Dr. Antoine PocheHochrein. However looking in the notes, the initial consult was with Dr. Swaz[place]. Pt asking for a call back.

## 2016-09-12 ENCOUNTER — Ambulatory Visit: Payer: BLUE CROSS/BLUE SHIELD | Admitting: Occupational Therapy

## 2016-09-12 ENCOUNTER — Ambulatory Visit (INDEPENDENT_AMBULATORY_CARE_PROVIDER_SITE_OTHER): Payer: BLUE CROSS/BLUE SHIELD | Admitting: Cardiology

## 2016-09-12 ENCOUNTER — Encounter: Payer: Self-pay | Admitting: Cardiology

## 2016-09-12 ENCOUNTER — Ambulatory Visit: Payer: BLUE CROSS/BLUE SHIELD | Admitting: Physical Therapy

## 2016-09-12 VITALS — BP 168/98

## 2016-09-12 VITALS — BP 182/103 | HR 66 | Ht 59.5 in | Wt 171.2 lb

## 2016-09-12 DIAGNOSIS — Z951 Presence of aortocoronary bypass graft: Secondary | ICD-10-CM | POA: Diagnosis not present

## 2016-09-12 DIAGNOSIS — M6281 Muscle weakness (generalized): Secondary | ICD-10-CM

## 2016-09-12 DIAGNOSIS — E118 Type 2 diabetes mellitus with unspecified complications: Secondary | ICD-10-CM

## 2016-09-12 DIAGNOSIS — I69351 Hemiplegia and hemiparesis following cerebral infarction affecting right dominant side: Secondary | ICD-10-CM

## 2016-09-12 DIAGNOSIS — R278 Other lack of coordination: Secondary | ICD-10-CM | POA: Diagnosis not present

## 2016-09-12 DIAGNOSIS — I6340 Cerebral infarction due to embolism of unspecified cerebral artery: Secondary | ICD-10-CM | POA: Diagnosis not present

## 2016-09-12 DIAGNOSIS — R2689 Other abnormalities of gait and mobility: Secondary | ICD-10-CM

## 2016-09-12 MED ORDER — LISINOPRIL 20 MG PO TABS: 20.0000 mg | ORAL_TABLET | Freq: Every day | ORAL | 3 refills | Status: DC

## 2016-09-12 NOTE — Patient Instructions (Signed)
Medication Instructions:  INCREASE- Lisinopril 20 mg daily  Labwork: None Ordered  Testing/Procedures: None Ordered  Follow-Up: Your physician recommends that you schedule a follow-up appointment in: 1 Week with APP   Any Other Special Instructions Will Be Listed Below (If Applicable).   If you need a refill on your cardiac medications before your next appointment, please call your pharmacy.

## 2016-09-12 NOTE — Therapy (Signed)
[facility] [address] [place], [place], [address] Phone: [phone]   Fax:  [phone]  Patient Details  Name: [name] MRN: [id number] Date of Birth: 01/23/1971 Referring Provider:  Erick ColaceKirsteins, Andrew E, MD  Encounter Date: 09/12/2016   OT treatment withheld due to BP 170/90. (MD given guidelines it should be less than or equal to 160/100)   Kelli ChurnBallie, Amparo Donalson Johnson, OTR/L 09/12/2016, 4:09 PM  [facility] [address] [place], [place], [address] Phone: [phone]   Fax:  [phone]

## 2016-09-12 NOTE — Therapy (Signed)
[facility] [address] [place], [place], [address] Phone: [phone]   Fax:  [phone]  Physical Therapy Treatment  Patient Details  Name: [name] MRN: [id number] Date of Birth: 1971-06-19 Referring Provider: Erick Colace, MD  Encounter Date: 09/12/2016      PT End of Session - 09/12/16 1525    Visit Number 4   Number of Visits 17   Date for PT Re-Evaluation 10/26/16   Authorization Type BCBS   PT Start Time 1445   PT Stop Time 1525   PT Time Calculation (min) 40 min   Activity Tolerance Patient tolerated treatment well;Treatment limited secondary to medical complications (Comment)   Behavior During Therapy [facility] for tasks assessed/performed      Past Medical History:  Diagnosis Date  . Headache    due to elevated  BP  . History of kidney stones    "passed them"  . Hypertension   . Pancreatic injury    "damage to pancrease related to gallbladder OR"  . Pneumonia X 1  . Type II diabetes mellitus (HCC)     Past Surgical History:  Procedure Laterality Date  . CARDIAC CATHETERIZATION  08/07/2016  . CARDIAC CATHETERIZATION N/A 08/07/2016   Procedure: Left Heart Cath and Coronary Angiography;  Surgeon: Lyn Records, MD;  Location[facility] INVAS[facility] LAB;  Service: Cardiovascular;  Laterality: N/A;  . CARDIAC CATHETERIZATION N/A 08/07/2016   Procedure: Intravascular Pressure Wire/FFR Study;  Surgeon: Lyn Records, MD;  Location: [facility] INVASIVE [facility] LAB;  Service: Cardiovascular;  Laterality: N/A;  . CORONARY ARTERY BYPASS GRAFT N/A 08/09/2016   Procedure: CORONARY ARTERY BYPASS GRAFTING (CABG) x three,SVG to distal circ OM3, SVG to Om1, LIMA to LAD-using left internal mammary artery and right leg greater saphenous vein harvested endoscopically, Sternal plating - right side;  Surgeon: Delight Ovens, MD;  Location: [facility] OR;  Service: Open Heart Surgery;  Laterality: N/A;  . FACIAL RECONSTRUCTION  SURGERY  2009   "facial fractures"  . FRACTURE SURGERY    . LAPAROSCOPIC CHOLECYSTECTOMY    . TEE WITHOUT CARDIOVERSION N/A 08/09/2016   Procedure: TRANSESOPHAGEAL ECHOCARDIOGRAM (TEE);  Surgeon: Delight Ovens, MD;  Location: [facility] OR;  Service: Open Heart Surgery;  Laterality: N/A;  . TUBAL LIGATION      Vitals:   09/12/16 1453 09/12/16 1510 09/12/16 1520  BP: (!) 158/93 (!) 173/97 (!) 168/98        Subjective Assessment - 09/12/16 1453    Subjective doing well; went to MD today.  took additional 15 mg lisinopril today; dose now 20 mg   Pertinent History Hold exercise if BP goes above 160/100 per cardiologist recommendations.   Patient Stated Goals "Be back to normal and be able to do more for myself" Would like to be more independent   Currently in Pain? No/denies                         [facility] Adult PT Treatment/Exercise - 09/12/16 1505      Exercises   Exercises Knee/Hip     Knee/Hip Exercises: Standing   Hip Flexion Both;10 reps;Knee bent   Hip Flexion Limitations 3#; alternating   Hip Abduction Both;10 reps;Knee straight   Abduction Limitations 3#; alternating   Hip Extension Both;10 reps;Knee straight   Extension Limitations 3#; alternating     Knee/Hip Exercises: Seated   Sit to Sand 10 reps;without UE support  Balance Exercises - 09/12/16 1504      Balance Exercises: Standing   SLS Solid surface;Intermittent upper extremity support;3 reps;15 secs   Tandem Gait Forward;Retro;2 reps     OTAGO PROGRAM   Heel Walking No support   Toe Walk No support           PT Education - 09/12/16 1525    Education provided Yes   Education Details reasoning to keep BP parameters within MD orders   Person(s) Educated Patient   Methods Explanation   Comprehension Verbalized understanding          PT Short Term Goals - 09/03/16 2132      PT SHORT TERM GOAL #1   Title TARGET FOR ALL STG 09/25/2016-Pt will initiate HEP and  demonstrate independence with performing    Status On-going     PT SHORT TERM GOAL #2   Title Pt will participate in 6 minute walk test of endurance with LTG to be set.   Baseline 09/03/16: 969 feet with no AD or rest breaks taken   Status Achieved     PT SHORT TERM GOAL #3   Title Pt will participate in Functional Gait Assessment (FGA) and LTG to be set.   Baseline 09/03/16: 18/30 for baseline values   Status Achieved     PT SHORT TERM GOAL #4   Title Pt will negotiate 8 stairs with 2 rails alternating sequence supervision with no UE support   Status On-going     PT SHORT TERM GOAL #5   Title Pt will demonstrate lowered fall risk as indicated by a BERG balance score of 55/56.   Status On-going           PT Long Term Goals - 08/27/16 1041      PT LONG TERM GOAL #1   Title TARGET DATE FOR ALL LTG 10/26/16: Pt will improve RLE strength to 5/5 overall   Status New     PT LONG TERM GOAL #2   Title Pt will improve FGA score by 4.2 points to demonstrate decreased falls risk during community ambulation.   Status New     PT LONG TERM GOAL #3   Title Pt will improve gait velocity to >3ft/sec for increased independence with community gait.     PT LONG TERM GOAL #4   Title Pt will improve 6 minute by 172ft to demonstrate improvement in functional endurance.   Status New     PT LONG TERM GOAL #5   Title Pt will negotiate >500 over level and uneven surfaces (indoor/outdoor) and will negotiate 10 stairs at mod I level    Status New               Plan - 09/12/16 1526    Clinical Impression Statement BP levels appropriate for exercise today so reviewed HEP (pt not performing at home) and initiated standing strengthening and balance exercises.  Reassessed BP which was elevated and remained elevated after 10 min rest.  Decided to hold PT for rest of day and OT to reassess before session.     PT Treatment/Interventions ADLs/Self Care Home Management;Moist Heat;DME Instruction;Gait  training;Stair training;Functional mobility training;Therapeutic activities;Therapeutic exercise;Balance training;Neuromuscular re-education;Patient/family education;Energy conservation;Taping   PT Next Visit Plan MONITOR VITALS at every visit; add to HEP as indicated; continued to work on LE strengthening, high level balance (on compliant surfaces and single leg stance) and activity tolerance.      Consulted and Agree with Plan of Care Patient  Patient will benefit from skilled therapeutic intervention in order to improve the following deficits and impairments:  Abnormal gait, Cardiopulmonary status limiting activity, Decreased endurance, Decreased balance, Decreased coordination, Decreased strength, Difficulty walking, Impaired sensation  Visit Diagnosis: Hemiplegia and hemiparesis following cerebral infarction affecting right dominant side (HCC)  Other abnormalities of gait and mobility  Muscle weakness (generalized)     Problem List Patient Active Problem List   Diagnosis Date Noted  . Atypical chest pain   . Stroke due to embolism (HCC) 08/19/2016  . Diabetes mellitus (HCC)   . Hypervolemia   . Stroke (HCC)   . Coronary artery disease involving coronary bypass graft of native heart without angina pectoris   . Hx of CABG   . Benign essential HTN   . Migraine without status migrainosus, not intractable   . Labile blood glucose   . Diabetes mellitus type 2 in obese (HCC)   . Morbid obesity (HCC)   . Tachypnea   . Hypoalbuminemia due to protein-calorie malnutrition (HCC)   . Leukocytosis   . Acute blood loss anemia   . Pressure injury of skin 08/12/2016  . Coronary artery disease involving native heart without angina pectoris   . 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 ACS (acute coronary syndrome) (HCC) 08/07/2016  . Hypertension 08/07/2016  . Diabetes mellitus with cardiac complication (HCC) 08/07/2016  . Non-STEMI (non-ST elevated myocardial infarction) (HCC) 08/07/2016  .  NSTEMI (non-ST elevated myocardial infarction) (HCC) 08/07/2016      Clarita CraneStephanie F Asencion Loveday, PT, DPT 09/12/16 3:31 PM     [facility] [address] [place], [place], [address] Phone: [phone]   Fax:  [phone]  Name: Franne GripGegorria Cherry-[name] MRN: [id number] Date of Birth: 08/08/1970

## 2016-09-13 ENCOUNTER — Encounter: Payer: Self-pay | Admitting: Cardiology

## 2016-09-16 ENCOUNTER — Inpatient Hospital Stay: Payer: BLUE CROSS/BLUE SHIELD | Admitting: Physical Medicine & Rehabilitation

## 2016-09-17 ENCOUNTER — Ambulatory Visit: Payer: BLUE CROSS/BLUE SHIELD | Admitting: Occupational Therapy

## 2016-09-17 ENCOUNTER — Other Ambulatory Visit: Payer: Self-pay | Admitting: Physical Medicine and Rehabilitation

## 2016-09-17 ENCOUNTER — Ambulatory Visit: Payer: BLUE CROSS/BLUE SHIELD | Admitting: Physical Therapy

## 2016-09-18 ENCOUNTER — Ambulatory Visit (INDEPENDENT_AMBULATORY_CARE_PROVIDER_SITE_OTHER): Payer: BLUE CROSS/BLUE SHIELD | Admitting: Cardiology

## 2016-09-18 ENCOUNTER — Encounter: Payer: Self-pay | Admitting: Cardiology

## 2016-09-18 VITALS — BP 151/90 | HR 56 | Ht 59.5 in | Wt 170.6 lb

## 2016-09-18 DIAGNOSIS — I63113 Cerebral infarction due to embolism of bilateral vertebral arteries: Secondary | ICD-10-CM | POA: Diagnosis not present

## 2016-09-18 DIAGNOSIS — E118 Type 2 diabetes mellitus with unspecified complications: Secondary | ICD-10-CM

## 2016-09-18 DIAGNOSIS — R0789 Other chest pain: Secondary | ICD-10-CM

## 2016-09-18 DIAGNOSIS — E669 Obesity, unspecified: Secondary | ICD-10-CM | POA: Diagnosis not present

## 2016-09-18 DIAGNOSIS — I10 Essential (primary) hypertension: Secondary | ICD-10-CM | POA: Diagnosis not present

## 2016-09-18 DIAGNOSIS — Z79899 Other long term (current) drug therapy: Secondary | ICD-10-CM | POA: Diagnosis not present

## 2016-09-18 DIAGNOSIS — Z951 Presence of aortocoronary bypass graft: Secondary | ICD-10-CM

## 2016-09-18 DIAGNOSIS — IMO0001 Reserved for inherently not codable concepts without codable children: Secondary | ICD-10-CM

## 2016-09-18 DIAGNOSIS — Z794 Long term (current) use of insulin: Secondary | ICD-10-CM | POA: Diagnosis not present

## 2016-09-18 LAB — BASIC METABOLIC PANEL
BUN: 11 mg/dL (ref 7–25)
CO2: 26 mmol/L (ref 20–31)
Calcium: 9.1 mg/dL (ref 8.6–10.2)
Chloride: 107 mmol/L (ref 98–110)
Creat: 0.72 mg/dL (ref 0.50–1.10)
Glucose, Bld: 146 mg/dL — ABNORMAL HIGH (ref 65–99)
Potassium: 4.2 mmol/L (ref 3.5–5.3)
Sodium: 140 mmol/L (ref 135–146)

## 2016-09-18 NOTE — Patient Instructions (Signed)
Medication Instructions:  Your physician recommends that you continue on your current medications as directed. Please refer to the Current Medication list given to you today.  If you need a refill on your cardiac medications before your next appointment, please call your pharmacy.  Labwork: [facility] 1[facility] floot  Follow-Up: Your physician recommends that you schedule a follow-up appointment in: 2 MONTHS [facility]    Thank you for choosing [facility]!!    Virgina EvenerLUKE KILROY, PA-C Victoriya Pol, LPN

## 2016-09-18 NOTE — Assessment & Plan Note (Addendum)
LIMA-LAD, SVG-OM3, SVG-OM1-08/09/16 Normal LVF

## 2016-09-18 NOTE — Assessment & Plan Note (Signed)
Here for follow up - lisinopril increased to 20 mg by Dr Antoine PocheHochrein 09/12/16 and clonidine 0.2 mg added by Dr Pecola Leisureeese yesterday

## 2016-09-18 NOTE — Progress Notes (Signed)
09/18/2016 [name]   August 20, 1970  [id number]  Primary Physician: Dr [name] Primary Cardiologist: Dr [name]  HPI:   46 y.o. female who presents for follow up after CABG x 3 on 08/09/16. Her EF was normal pre op.  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  She went to therapy afterward.  She saw Dr [name] 09/12/16 and her B/P was elevated. Her Lisinopril was increased to 20 mg. She is in the office today for follow up. She had seen Dr [name] yesterday and the pt says clonidine 0.2 mg daily was added. Today her initial B/P was 151/90, repeat by me with large cuff 15 minutes later was 148/82. She has not filled her Rx for clonidine yet.    Current Outpatient Prescriptions  Medication Sig Dispense Refill  . aspirin EC 325 MG EC tablet Take 1 tablet (325 mg total) by mouth daily. 30 tablet 0  . atorvastatin (LIPITOR) 80 MG tablet Take 1 tablet (80 mg total) by mouth daily at 6 PM. 30 tablet 0  . B-D UF III MINI PEN NEEDLES 31G X 5 MM MISC     . blood glucose meter kit and supplies KIT Dispense based on patient and insurance preference. Use up to four times daily as directed. (FOR ICD-9 250.00, 250.01). 1 each 0  . cloNIDine (CATAPRES) 0.2 MG tablet Take 0.2 mg by mouth daily.    . clopidogrel (PLAVIX) 75 MG tablet Take 1 tablet (75 mg total) by mouth daily. 30 tablet 0  . Insulin Detemir (LEVEMIR) 100 UNIT/ML Pen Inject 30 Units into the skin daily after breakfast. (Patient taking differently: Inject 30 Units into the skin Nightly. ) 15 mL 0  . Insulin Pen Needle (CLICKFINE PEN NEEDLES) 31G X 8 MM MISC 1 application by Does not apply route as directed. 100 each 0  . LEVEMIR 100 UNIT/ML injection     . lisinopril (PRINIVIL,ZESTRIL) 20 MG tablet Take 1 tablet (20 mg total) by mouth daily. 90 tablet 3  . metoprolol tartrate (LOPRESSOR) 25 MG tablet Take 1 tablet (25 mg total) by mouth 2 (two) times daily. 60 tablet 0  . nitroGLYCERIN (NITROSTAT) 0.4 MG SL tablet Place 1  tablet (0.4 mg total) under the tongue every 5 (five) minutes as needed for chest pain. Max 3 doses--call MD if you need to use 2 or more pills 30 tablet 0  . NOVOLOG 100 UNIT/ML injection     . ONETOUCH VERIO test strip     . pantoprazole (PROTONIX) 40 MG tablet Take 1 tablet (40 mg total) by mouth daily before breakfast. 30 tablet 0  . senna-docusate (SENOKOT-S) 8.6-50 MG tablet Take 1 tablet by mouth at bedtime. 30 tablet 0  . traMADol (ULTRAM) 50 MG tablet Take 1 tablet (50 mg total) by mouth every 12 (twelve) hours as needed for moderate pain. 15 tablet 0  . furosemide (LASIX) 40 MG tablet Take 1 tablet (40 mg total) by mouth daily. 30 tablet 0   No current facility-administered medications for this visit.     Allergies  Allergen Reactions  . Eugenol Hives and Other (See Comments)    Burns mouth  . Hydrochlorothiazide Anaphylaxis  . Morphine And Related Anaphylaxis  . Percocet [Oxycodone-Acetaminophen] Anaphylaxis and Nausea And Vomiting  . Shellfish Allergy Anaphylaxis  . Sulfa Antibiotics Anaphylaxis  . Tomato Anaphylaxis    Social History   Social History  . Marital status: Married    Spouse name: N/A  .  Number of children: N/A  . Years of education: N/A   Occupational History  . Supervisor [name]   Social History Main Topics  . Smoking status: Former Smoker    Packs/day: 0.50    Years: 20.00    Types: Cigarettes  . Smokeless tobacco: Never Used  . Alcohol use No  . Drug use: No  . Sexual activity: Yes    Birth control/ protection: None   Other Topics Concern  . Not on file   Social History Narrative   Lives with husband and daughter     Review of Systems: General: negative for chills, fever, night sweats or weight changes.  Cardiovascular: negative for chest pain, dyspnea on exertion, edema, orthopnea, palpitations, paroxysmal nocturnal dyspnea or shortness of breath Dermatological: negative for rash Respiratory: negative for cough or  wheezing Urologic: negative for hematuria Abdominal: negative for nausea, vomiting, diarrhea, bright red blood per rectum, melena, or hematemesis Neurologic: negative for visual changes, syncope, or dizziness All other systems reviewed and are otherwise negative except as noted above.    Blood pressure (!) 151/90, pulse (!) 56, height 4' 11.5" (1.511 m), weight 170 lb 9.6 oz (77.4 kg), SpO2 100 %.  General appearance: alert, cooperative, no distress and mildly obese Neck: no carotid bruit and no JVD Lungs: clear to auscultation bilaterally Heart: regular rate and rhythm Skin: Skin color, texture, turgor normal. No rashes or lesions Neurologic: Grossly normal  EKG NSR, SB 55, TWI lateral leads  ASSESSMENT AND PLAN:   Essential hypertension Here for follow up - lisinopril increased to 20 mg by Dr [name] 09/12/16 and clonidine 0.2 mg added by Dr [name] yesterday   S/P CABG x 31 Jul 2016 LIMA-LAD, SVG-OM3, SVG-OM1-08/09/16 Normal LVF  Insulin dependent diabetes mellitus with complications ([name]) IDDM with CAD  Obesity (BMI 30.0-34.9) BMI 33  Stroke due to embolism ([name]) Post CABG basilar artery CVA   PLAN  Dr [name] had suggested a f/u BMP on the increased dose of Lisinopril and I'll get that today. The pt wanted to know if she could take her clonidine at night since she thinks the dose she was given in Dr Zenia Resides office made her sleepy. I suggested she take it in the morning as it would provide most of its effect during the day. If has fatigue she could try 0.1 mg BID but will defer that to Dr [name]- the pt is going to see her again in a week. I'll arrange for Dr [name] to see her in two months.   Kerin Ransom PA-C 09/18/2016 11:12 AM

## 2016-09-18 NOTE — Assessment & Plan Note (Signed)
Post CABG basilar artery CVA

## 2016-09-18 NOTE — Assessment & Plan Note (Signed)
IDDM with CAD 

## 2016-09-18 NOTE — Assessment & Plan Note (Signed)
Estimated body mass index is 33.58 kg/m as calculated from the following:   Height as of this encounter: 5' (1.524 m).   Weight as of this encounter: 78 kg.   -This will complicate overall prognosis 

## 2016-09-19 ENCOUNTER — Ambulatory Visit: Payer: BLUE CROSS/BLUE SHIELD | Admitting: Occupational Therapy

## 2016-09-19 ENCOUNTER — Encounter: Payer: Self-pay | Admitting: Occupational Therapy

## 2016-09-19 ENCOUNTER — Encounter: Payer: Self-pay | Admitting: Physical Therapy

## 2016-09-19 ENCOUNTER — Ambulatory Visit: Payer: BLUE CROSS/BLUE SHIELD | Admitting: Physical Therapy

## 2016-09-19 VITALS — BP 159/95 | HR 65

## 2016-09-19 DIAGNOSIS — R2689 Other abnormalities of gait and mobility: Secondary | ICD-10-CM

## 2016-09-19 DIAGNOSIS — R278 Other lack of coordination: Secondary | ICD-10-CM | POA: Diagnosis not present

## 2016-09-19 DIAGNOSIS — M6281 Muscle weakness (generalized): Secondary | ICD-10-CM

## 2016-09-19 DIAGNOSIS — I69318 Other symptoms and signs involving cognitive functions following cerebral infarction: Secondary | ICD-10-CM

## 2016-09-19 DIAGNOSIS — I69351 Hemiplegia and hemiparesis following cerebral infarction affecting right dominant side: Secondary | ICD-10-CM

## 2016-09-19 NOTE — Therapy (Signed)
[facility] [address] [place], [place], [address] Phone: [phone]   Fax:  [phone]  Occupational Therapy Treatment  Patient Details  Name: [name] MRN: [id number] Date of Birth: [birth year] Referring Provider: Dr. Claudette Laws  Encounter Date: 09/19/2016      OT End of Session - 09/19/16 1724    Visit Number 3   Number of Visits 9   Date for OT Re-Evaluation 09/27/16   Authorization Type BC/BS   OT Start Time 1533   OT Stop Time 1615   OT Time Calculation (min) 42 min   Activity Tolerance Patient tolerated treatment well   Behavior During Therapy [facility] for tasks assessed/performed      Past Medical History:  Diagnosis Date  . Headache    due to elevated  BP  . History of kidney stones    "passed them"  . Hypertension   . Pancreatic injury    "damage to pancrease related to gallbladder OR"  . Pneumonia X 1  . Type II diabetes mellitus (HCC)     Past Surgical History:  Procedure Laterality Date  . CARDIAC CATHETERIZATION  08/07/2016  . CARDIAC CATHETERIZATION N/A 08/07/2016   Procedure: Left Heart Cath and Coronary Angiography;  Surgeon: Lyn Records, MD;  Location[facility] INVAS[facility] CV LAB;  Service: Cardiovascular;  Laterality: N/A;  . CARDIAC CATHETERIZATION N/A 08/07/2016   Procedure: Intravascular Pressure Wire/FFR Study;  Surgeon: Lyn Records, MD;  Location: [facility] INVASIVE CV LAB;  Service: Cardiovascular;  Laterality: N/A;  . CORONARY ARTERY BYPASS GRAFT N/A 08/09/2016   Procedure: CORONARY ARTERY BYPASS GRAFTING (CABG) x three,SVG to distal circ OM3, SVG to Om1, LIMA to LAD-using left internal mammary artery and right leg greater saphenous vein harvested endoscopically, Sternal plating - right side;  Surgeon: Delight Ovens, MD;  Location: [facility] OR;  Service: Open Heart Surgery;  Laterality: N/A;  . FACIAL RECONSTRUCTION SURGERY  2009   "facial fractures"  . FRACTURE SURGERY    .  LAPAROSCOPIC CHOLECYSTECTOMY    . TEE WITHOUT CARDIOVERSION N/A 08/09/2016   Procedure: TRANSESOPHAGEAL ECHOCARDIOGRAM (TEE);  Surgeon: Delight Ovens, MD;  Location: [facility] OR;  Service: Open Heart Surgery;  Laterality: N/A;  . TUBAL LIGATION      There were no vitals filed for this visit.      Subjective Assessment - 09/19/16 1717    Subjective  i THINK I am doing pretty good   Patient is accompained by: Family member   Limitations sternal precautions    Patient Stated Goals increase endurance and return to work   Currently in Pain? No/denies   Pain Score 0-No pain                      OT Treatments/Exercises (OP) - 09/19/16 0001      ADLs   Writing 100% legible- right hand   Work Reviewed specific work details.  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  She indicates she spent much of her day at a desk, but was required to walk up/down 1 flight of stairs 1-2 times daily.  She indicates she did not spend much time at once on her feet.  She is eager to get back to driving and work, and indicates her workplace is very supportive.       Hand Exercises   Theraputty - Grip Patient indicates that she has not been doing theraputty exercises.  Reinforced the potential benefit to hand strength.     Hand Gripper with Large Beads Patient able to complete 25 blocks on lightest resistance     Fine Motor Coordination   Other Fine Motor Exercises In hand manipulation - patient did best when distracted.  Needed occasional cues to follow specific instructions.               Balance Exercises - 09/19/16 1513      Balance Exercises: Standing   SLS with Vectors Foam/compliant surface;Other reps (comment);Limitations   Balance Beam standing across blue foam beam with 1 UE HHA/min assist           OT Education - 09/19/16 1724    Education provided Yes   Education Details reinforced the benefit of increasing activity level at home    Person(s) Educated Patient;Parent(s)   Methods Explanation   Comprehension Verbalized understanding;Need further instruction             OT Long Term Goals - 08/27/16 1217      OT LONG TERM GOAL #1   Title Independent with HEP for coordination and strength RUE   Time 4   Period Weeks   Status New     OT LONG TERM GOAL #2   Title Improve coordination as evidenced by reducing speed on 9 hole peg test to 28 sec. or less   Baseline eval = 33.50 sec   Time 4   Period Weeks   Status New     OT LONG TERM GOAL #3   Title Improve grip strength Rt hand to 60 lbs or greater to open jars/containers   Baseline eval = 50 lbs (Lt = 65 lbs)    Time 4   Period Weeks   Status New     OT LONG TERM GOAL #4   Title Pt to verbalize understanding with memory compensatory strategies   Time 4   Period Weeks   Status New     OT LONG TERM GOAL #5   Title Pt to return to light IADLS including meal prep and light cleaning mod I level for 30 minute without rest   Time 4   Period Weeks   Status New     Long Term Additional Goals   Additional Long Term Goals Yes     OT LONG TERM GOAL #6   Title Pt to perform complex organizational and problem solving tasks with extra time and external aids prn, but no cueing at 90% or greater accuracy in prep for work related tasks   Time 4   Period Weeks   Status New               Plan - 09/19/16 1725    Clinical Impression Statement Patient is showing steady progress toward her long term goals   Rehab Potential Good   OT Frequency 2x / week   OT Duration 4 weeks   OT Treatment/Interventions Self-care/ADL training;DME and/or AE instruction;Moist Heat;Fluidtherapy;Patient/family education;Therapeutic exercises;Therapeutic activities;Neuromuscular education;Functional Mobility Training;Passive range of motion;Cognitive remediation/compensation;Electrical Stimulation;Energy conservation;Manual Therapy;Visual/perceptual remediation/compensation    Plan kitchen activity to increase activity tolerance- goal 30 minutes   Consulted and Agree with Plan of Care Patient;Family member/caregiver   Family Member Consulted mom      Patient will benefit from skilled therapeutic intervention in order to improve the following deficits and impairments:  Cardiopulmonary status limiting activity, Decreased coordination, Decreased endurance, Impaired sensation, Decreased activity tolerance, Decreased knowledge of precautions, Decreased knowledge of use of  DME, Impaired UE functional use, Decreased cognition, Decreased mobility, Decreased strength, Impaired perceived functional ability  Visit Diagnosis: Hemiplegia and hemiparesis following cerebral infarction affecting right dominant side (HCC)  Muscle weakness (generalized)  Other lack of coordination  Other symptoms and signs involving cognitive functions following cerebral infarction    Problem List Patient Active Problem List   Diagnosis Date Noted  . Atypical chest pain   . Stroke due to embolism (HCC) 08/19/2016  . Migraine without status migrainosus, not intractable   . Obesity (BMI 30.0-34.9)   . Tachypnea   . Hypoalbuminemia due to protein-calorie malnutrition (HCC)   . Leukocytosis   . Acute blood loss anemia   . Pressure injury of skin 08/12/2016  .  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 Essential hypertension 08/07/2016  . Insulin dependent diabetes mellitus with complications (HCC) 08/07/2016  . Non-STEMI (non-ST elevated myocardial infarction) (HCC) 08/07/2016    Collier Salina, OTR/L 09/19/2016, 5:26 PM  [facility] [address] [place], [place], [address] Phone: [phone]   Fax:  [phone]  Name: Promiss Labarbera MRN: [id number] Date of Birth: [birth year]

## 2016-09-20 ENCOUNTER — Other Ambulatory Visit: Payer: Self-pay | Admitting: *Deleted

## 2016-09-20 DIAGNOSIS — Z951 Presence of aortocoronary bypass graft: Secondary | ICD-10-CM

## 2016-09-20 NOTE — Therapy (Signed)
[facility] [address] [place], [place], [address] Phone: [phone]   Fax:  [phone]  Physical Therapy Treatment  Patient Details  Name: [name] MRN: [id number] Date of Birth: 09/23/1970 Referring Provider: Erick ColaceAndrew E Kirsteins, MD  Encounter Date: 09/19/2016      PT End of Session - 09/19/16 1458    Visit Number 5   Number of Visits 17   Date for PT Re-Evaluation 10/26/16   Authorization Type BCBS   PT Start Time 1448   PT Stop Time 1530   PT Time Calculation (min) 42 min   Equipment Utilized During Treatment Gait belt   Activity Tolerance Patient tolerated treatment well;No increased pain   Behavior During Therapy WFL for tasks assessed/performed      Past Medical History:  Diagnosis Date  . Headache    due to elevated  BP  . History of kidney stones    "passed them"  . Hypertension   . Pancreatic injury    "damage to pancrease related to gallbladder OR"  . Pneumonia X 1  . Type II diabetes mellitus (HCC)     Past Surgical History:  Procedure Laterality Date  . CARDIAC CATHETERIZATION  08/07/2016  . CARDIAC CATHETERIZATION N/A 08/07/2016   Procedure: Left Heart Cath and Coronary Angiography;  Surgeon: Lyn RecordsHenry W Smith, MD;  [facility];  Service: Cardiovascular;  Laterality: N/A;  . CARDIAC CATHETERIZATION N/A 08/07/2016   Procedure: Intravascular Pressure Wire/FFR Study;  Surgeon: Lyn RecordsHenry W Smith, MD;  [facility];  Service: Cardiovascular;  Laterality: N/A;  . CORONARY ARTERY BYPASS GRAFT N/A 08/09/2016   Procedure: CORONARY ARTERY BYPASS GRAFTING (CABG) x three,SVG to distal circ OM3, SVG to Om1, LIMA to LAD-using left internal mammary artery and right leg greater saphenous vein harvested endoscopically, Sternal plating - right side;  Surgeon: Delight OvensEdward B Gerhardt, MD;  [facility];  Service: Open Heart Surgery;  Laterality: N/A;  . FACIAL RECONSTRUCTION  SURGERY  2009   "facial fractures"  . FRACTURE SURGERY    . LAPAROSCOPIC CHOLECYSTECTOMY    . TEE WITHOUT CARDIOVERSION N/A 08/09/2016   Procedure: TRANSESOPHAGEAL ECHOCARDIOGRAM (TEE);  Surgeon: Delight OvensEdward B Gerhardt, MD;  [facility];  Service: Open Heart Surgery;  Laterality: N/A;  . TUBAL LIGATION      Vitals:   09/19/16 1453 09/19/16 1512  BP: 138/78 (!) 159/95  Pulse: 65         Subjective Assessment - 09/19/16 1453    Subjective Now on 3 BP medications, started clonodine 2 days ago, feels much better now. Also saw the cardio PA yesterday. Had an EKG and blood work done due to low HR, is to follow Dr Pecola Leisureeese medication instructions until told otherwise.                 Patient is accompained by: Family member   Pertinent History Hold exercise if BP goes above 160/100 per cardiologist recommendations.   Limitations Lifting   Patient Stated Goals "Be back to normal and be able to do more for myself" Would like to be more independent   Currently in Pain? No/denies   Pain Score 0-No pain            [facility] Adult PT Treatment/Exercise - 09/19/16 1501      Transfers   Number of Reps 10 reps;1 set   Comments cues for full upright posture, no UE support.      High Level  Balance   High Level Balance Activities Marching forwards;Marching backwards;Tandem walking  tandem/heel walk/toe walk all fwd/bwd   High Level Balance Comments next to counter top with intermittent UE support on counter, 3 laps each with min guard assist on red mats             Balance Exercises - 09/19/16 1513      Balance Exercises: Standing   SLS with Vectors Foam/compliant surface;Other reps (comment);Limitations   Balance Beam standing across blue foam beam with 1 UE HHA/min assist: alternating fwd heel taps to floor and back onto beam x 10 each leg, alternating bwd toe taps to floor and back onto beam x 10 reps each leg. cues on stance position and weight shifting.                       Balance  Exercises: Standing   SLS with Vectors Limitations 2 tall cones on blue mat: alternating fwd tap, alternating cross taps, alternating fwd double toe taps and alternating cross double toe taps x 10 each bil legs with min to mod assist for balance.              PT Short Term Goals - 09/03/16 2132      PT SHORT TERM GOAL #1   Title TARGET FOR ALL STG 09/25/2016-Pt will initiate HEP and demonstrate independence with performing    Status On-going     PT SHORT TERM GOAL #2   Title Pt will participate in 6 minute walk test of endurance with LTG to be set.   Baseline 09/03/16: 969 feet with no AD or rest breaks taken   Status Achieved     PT SHORT TERM GOAL #3   Title Pt will participate in Functional Gait Assessment (FGA) and LTG to be set.   Baseline 09/03/16: 18/30 for baseline values   Status Achieved     PT SHORT TERM GOAL #4   Title Pt will negotiate 8 stairs with 2 rails alternating sequence supervision with no UE support   Status On-going     PT SHORT TERM GOAL #5   Title Pt will demonstrate lowered fall risk as indicated by a BERG balance score of 55/56.   Status On-going           PT Long Term Goals - 08/27/16 1041      PT LONG TERM GOAL #1   Title TARGET DATE FOR ALL LTG 10/26/16: Pt will improve RLE strength to 5/5 overall   Status New     PT LONG TERM GOAL #2   Title Pt will improve FGA score by 4.2 points to demonstrate decreased falls risk during community ambulation.   Status New     PT LONG TERM GOAL #3   Title Pt will improve gait velocity to >64ft/sec for increased independence with community gait.     PT LONG TERM GOAL #4   Title Pt will improve 6 minute by 169ft to demonstrate improvement in functional endurance.   Status New     PT LONG TERM GOAL #5   Title Pt will negotiate >500 over level and uneven surfaces (indoor/outdoor) and will negotiate 10 stairs at mod I level    Status New               Plan - 09/19/16 1458    Clinical Impression  Statement Pt's BP levels appropriate for exercise today. Skilled session focused on high level balance and single leg  balance activities with no significant issues reported. Pt is making steady progress toward goals. Pt should benefit from continued PT to progress toward unmet goals. BP did elevated to 156/86 with balance activities with cones. BP at end of session was 156/90, OT notified and to continue to monitor.                    PT Treatment/Interventions ADLs/Self Care Home Management;Moist Heat;DME Instruction;Gait training;Stair training;Functional mobility training;Therapeutic activities;Therapeutic exercise;Balance training;Neuromuscular re-education;Patient/family education;Energy conservation;Taping   PT Next Visit Plan MONITOR VITALS at every visit; check STG's next visist (due 09/25/16);add to HEP as indicated; continued to work on LE strengthening, high level balance (on compliant surfaces and single leg stance) and activity tolerance.      Consulted and Agree with Plan of Care Patient      Patient will benefit from skilled therapeutic intervention in order to improve the following deficits and impairments:  Abnormal gait, Cardiopulmonary status limiting activity, Decreased endurance, Decreased balance, Decreased coordination, Decreased strength, Difficulty walking, Impaired sensation  Visit Diagnosis: Hemiplegia and hemiparesis following cerebral infarction affecting right dominant side (HCC)  Muscle weakness (generalized)  Other abnormalities of gait and mobility     Problem List Patient Active Problem List   Diagnosis Date Noted  . Atypical chest pain   . Stroke due to embolism (HCC) 08/19/2016  . Migraine without status migrainosus, not intractable   . Obesity (BMI 30.0-34.9)   . Tachypnea   . Hypoalbuminemia due to protein-calorie malnutrition (HCC)   . Leukocytosis   . Acute blood loss anemia   . Pressure injury of skin 08/12/2016  .  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 Essential hypertension 08/07/2016  . Insulin dependent diabetes mellitus with complications (HCC) 08/07/2016  . Non-STEMI (non-ST elevated myocardial infarction) (HCC) 08/07/2016    Sallyanne Kuster, PTA, [facility] [address] [place], [place] [address] [phone] 09/20/16, 3:27 PM   Name: Kagan Hietpas MRN: [id number] Date of Birth: 10/11/1970

## 2016-09-25 ENCOUNTER — Encounter: Payer: Self-pay | Admitting: Physical Therapy

## 2016-09-25 ENCOUNTER — Ambulatory Visit: Payer: BLUE CROSS/BLUE SHIELD | Admitting: Occupational Therapy

## 2016-09-25 ENCOUNTER — Ambulatory Visit: Payer: BLUE CROSS/BLUE SHIELD | Admitting: Physical Therapy

## 2016-09-25 VITALS — BP 160/80 | HR 50

## 2016-09-25 DIAGNOSIS — M6281 Muscle weakness (generalized): Secondary | ICD-10-CM

## 2016-09-25 DIAGNOSIS — I69351 Hemiplegia and hemiparesis following cerebral infarction affecting right dominant side: Secondary | ICD-10-CM

## 2016-09-25 DIAGNOSIS — R278 Other lack of coordination: Secondary | ICD-10-CM

## 2016-09-25 DIAGNOSIS — R2689 Other abnormalities of gait and mobility: Secondary | ICD-10-CM

## 2016-09-25 NOTE — Patient Instructions (Signed)

## 2016-09-25 NOTE — Therapy (Signed)
[facility] [address] [place], [place], [address] Phone: [phone]   Fax:  [phone]  Physical Therapy Treatment  Patient Details  Name: [name] MRN: [id number] Date of Birth: [birth year] Referring Provider: Erick Colace, MD  Encounter Date: 09/25/2016      PT End of Session - 09/25/16 1632    Visit Number 6   Number of Visits 17   Date for PT Re-Evaluation 10/26/16   Authorization Type BCBS   PT Start Time 1530   PT Stop Time 1615   PT Time Calculation (min) 45 min   Activity Tolerance Patient tolerated treatment well;No increased pain   Behavior During Therapy WFL for tasks assessed/performed      Past Medical History:  Diagnosis Date  . Headache    due to elevated  BP  . History of kidney stones    "passed them"  . Hypertension   . Pancreatic injury    "damage to pancrease related to gallbladder OR"  . Pneumonia X 1  . Type II diabetes mellitus (HCC)     Past Surgical History:  Procedure Laterality Date  . CARDIAC CATHETERIZATION  08/07/2016  . CARDIAC CATHETERIZATION N/A 08/07/2016   Procedure: Left Heart Cath and Coronary Angiography;  Surgeon: Lyn Records, MD;  Location: [facility] INVASIVE CV LAB;  Service: Cardiovascular;  Laterality: N/A;  . CARDIAC CATHETERIZATION N/A 08/07/2016   Procedure: Intravascular Pressure Wire/FFR Study;  Surgeon: Lyn Records, MD;  Location: [facility] INVASIVE CV LAB;  Service: Cardiovascular;  Laterality: N/A;  . CORONARY ARTERY BYPASS GRAFT N/A 08/09/2016   Procedure: CORONARY ARTERY BYPASS GRAFTING (CABG) x three,SVG to distal circ OM3, SVG to Om1, LIMA to LAD-using left internal mammary artery and right leg greater saphenous vein harvested endoscopically, Sternal plating - right side;  Surgeon: Delight Ovens, MD;  Location: [facility] OR;  Service: Open Heart Surgery;  Laterality: N/A;  . FACIAL RECONSTRUCTION SURGERY  2009   "facial fractures"  . FRACTURE  SURGERY    . LAPAROSCOPIC CHOLECYSTECTOMY    . TEE WITHOUT CARDIOVERSION N/A 08/09/2016   Procedure: TRANSESOPHAGEAL ECHOCARDIOGRAM (TEE);  Surgeon: Delight Ovens, MD;  Location: [facility] OR;  Service: Open Heart Surgery;  Laterality: N/A;  . TUBAL LIGATION      Vitals:   09/25/16 1534  BP: (!) 160/80  Pulse: (!) 50        Subjective Assessment - 09/25/16 1537    Subjective Still taking 3 BP medications, small headache today but feels it is due to glasses squeezing behind her ears.  Has not heard any results back from EKG or blood work.   Patient is accompained by: Family member   Pertinent History Hold exercise if BP goes above 160/100 per cardiologist recommendations.   Limitations Lifting   Patient Stated Goals "Be back to normal and be able to do more for myself" Would like to be more independent   Currently in Pain? Yes   Pain Score 2    Pain Location Head   Pain Orientation Anterior   Pain Descriptors / Indicators Headache   Pain Type Acute pain           [facility] Adult PT Treatment/Exercise - 09/25/16 1539      Ambulation/Gait   Stairs Yes   Stairs Assistance 5: Supervision   Stair Management Technique No rails;Step to pattern;Alternating pattern;Forwards   Number of Stairs 16   Height of Stairs 6     Standardized Balance Assessment  Standardized Balance Assessment Berg Balance Test     Berg Balance Test   Sit to Stand Able to stand without using hands and stabilize independently   Standing Unsupported Able to stand safely 2 minutes   Sitting with Back Unsupported but Feet Supported on Floor or Stool Able to sit safely and securely 2 minutes   Stand to Sit Sits safely with minimal use of hands   Transfers Able to transfer safely, minor use of hands   Standing Unsupported with Eyes Closed Able to stand 10 seconds safely   Standing Ubsupported with Feet Together Able to place feet together independently and stand for 1 minute with supervision   From Standing, Reach  Forward with Outstretched Arm Can reach confidently >25 cm (10")   From Standing Position, Pick up Object from Floor Able to pick up shoe safely and easily   From Standing Position, Turn to Look Behind Over each Shoulder Looks behind from both sides and weight shifts well   Turn 360 Degrees Able to turn 360 degrees safely in 4 seconds or less   Standing Unsupported, Alternately Place Feet on Step/Stool Able to stand independently and safely and complete 8 steps in 20 seconds   Standing Unsupported, One Foot in Front Able to place foot tandem independently and hold 30 seconds   Standing on One Leg Able to lift leg independently and hold > 10 seconds   Total Score 55             Balance Exercises - 09/25/16 1558      Balance Exercises: Standing   SLS Eyes open;Solid surface;Other reps (comment)  single leg sit <> stand. 10 reps, 2 sets each side   Gait with Head Turns Forward   Retro Gait Other reps (comment)  10 steps, x 2 reps   Sidestepping Other reps (comment)  10 reps to L and R   Turning Right;Left   Marching Limitations during gait over level ground, min A   Other Standing Exercises also performed changes in gait speed during gait-fast and slow     OTAGO PROGRAM   Heel Walking No support   Toe Walk No support  forwards and retro           PT Education - 09/25/16 1628    Education provided Yes   Education Details STG completion and areas to continue to focus on    Person(s) Educated Patient;Spouse   Methods Explanation   Comprehension Verbalized understanding          PT Short Term Goals - 09/25/16 1529      PT SHORT TERM GOAL #1   Title TARGET FOR ALL STG 09/25/2016-Pt will initiate HEP and demonstrate independence with performing    Status Achieved     PT SHORT TERM GOAL #2   Title Pt will participate in 6 minute walk test of endurance with LTG to be set.   Baseline 09/03/16: 969 feet with no AD or rest breaks taken   Status Achieved     PT SHORT TERM  GOAL #3   Title Pt will participate in Functional Gait Assessment (FGA) and LTG to be set.   Baseline 09/03/16: 18/30 for baseline values   Status Achieved     PT SHORT TERM GOAL #4   Title Pt will negotiate 8 stairs, alternating sequence supervision with no UE support   Baseline 12 steps, no rails, alternating sequence supervision on 09/25/2016   Status Achieved     PT SHORT  TERM GOAL #5   Title Pt will demonstrate lowered fall risk as indicated by a BERG balance score of 55/56.   Baseline 55/56 on 09/25/2016   Status Achieved           PT Long Term Goals - 08/27/16 1041      PT LONG TERM GOAL #1   Title TARGET DATE FOR ALL LTG 10/26/16: Pt will improve RLE strength to 5/5 overall   Status New     PT LONG TERM GOAL #2   Title Pt will improve FGA score by 4.2 points to demonstrate decreased falls risk during community ambulation.   Status New     PT LONG TERM GOAL #3   Title Pt will improve gait velocity to >443ft/sec for increased independence with community gait.     PT LONG TERM GOAL #4   Title Pt will improve 6 minute by 110912ft to demonstrate improvement in functional endurance.   Status New     PT LONG TERM GOAL #5   Title Pt will negotiate >500 over level and uneven surfaces (indoor/outdoor) and will negotiate 10 stairs at mod I level    Status New               Plan - 09/25/16 1632    Clinical Impression Statement Pt's systolic BP slightly elevated today but below parameters.  Treatment session today focused on re-assessment of STG-BERG balance and stair negotiation.  Pt has improved BERG balance from 48 to 55/56 decreasing falls risk to low falls risk.  Pt also able to demonstrate stair negotiation without UE support, alternating sequence but required supervision for balance and safety.  Continued higher level balance, LE strengthening and endurance training with gait >600' with various balance challenges.  Pt BP remained stable during session.  Pt pleased with  progress.   Rehab Potential Good   Clinical Impairments Affecting Rehab Potential Cardipulmonary limitations   PT Treatment/Interventions ADLs/Self Care Home Management;Moist Heat;DME Instruction;Gait training;Stair training;Functional mobility training;Therapeutic activities;Therapeutic exercise;Balance training;Neuromuscular re-education;Patient/family education;Energy conservation;Taping   PT Next Visit Plan MONITOR VITALS at every visit;  continued to work on LE strengthening, high level balance (on compliant surfaces and single leg stance) and activity tolerance/endurance     Consulted and Agree with Plan of Care Patient   Family Member Consulted Husband-[name]      Patient will benefit from skilled therapeutic intervention in order to improve the following deficits and impairments:  Abnormal gait, Cardiopulmonary status limiting activity, Decreased endurance, Decreased balance, Decreased coordination, Decreased strength, Difficulty walking, Impaired sensation  Visit Diagnosis: Hemiplegia and hemiparesis following cerebral infarction affecting right dominant side (HCC)  Muscle weakness (generalized)  Other abnormalities of gait and mobility     Problem List Patient Active Problem List   Diagnosis Date Noted  . Atypical chest pain   . Stroke due to embolism (HCC) 08/19/2016  . Migraine without status migrainosus, not intractable   . Obesity (BMI 30.0-34.9)   . Tachypnea   . Hypoalbuminemia due to protein-calorie malnutrition (HCC)   . Leukocytosis   . Acute blood loss anemia   . Pressure injury of skin 08/12/2016  .  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 Essential hypertension 08/07/2016  . Insulin dependent diabetes mellitus with complications (HCC) 08/07/2016  . Non-STEMI (non-ST elevated myocardial infarction) (HCC) 08/07/2016   Edman CircleAudra Hall, PT, DPT 09/25/16    4:38 PM   [facility] [address] [place], [place], [address]  Phone: [phone]   Fax:  [phone]  Name: [name] MRN: [id number] Date of Birth: 05/29/1971

## 2016-09-25 NOTE — Therapy (Addendum)
[facility] [address] [place], [place], [address] Phone: [phone]   Fax:  [phone]  Occupational Therapy Treatment  Patient Details  Name: [name] MRN: [id number] Date of Birth: 01-Oct-1970 Referring Provider: Dr. Claudette Laws  Encounter Date: 09/25/2016      OT End of Session - 09/25/16 1742    Visit Number 4   Number of Visits 9   Date for OT Re-Evaluation 09/27/16   Authorization Type BC/BS   Authorization Time Period week 3/4   OT Start Time 1620   OT Stop Time 1700   OT Time Calculation (min) 40 min   Activity Tolerance Patient tolerated treatment well   Behavior During Therapy [facility] for tasks assessed/performed      Past Medical History:  Diagnosis Date  . Headache    due to elevated  BP  . History of kidney stones    "passed them"  . Hypertension   . Pancreatic injury    "damage to pancrease related to gallbladder OR"  . Pneumonia X 1  . Type II diabetes mellitus (HCC)     Past Surgical History:  Procedure Laterality Date  . CARDIAC CATHETERIZATION  08/07/2016  . CARDIAC CATHETERIZATION N/A 08/07/2016   Procedure: Left Heart Cath and Coronary Angiography;  Surgeon: Lyn Records, MD;  Location: [facility] INVASIVE CV LAB;  Service: Cardiovascular;  Laterality: N/A;  . CARDIAC CATHETERIZATION N/A 08/07/2016   Procedure: Intravascular Pressure Wire/FFR Study;  Surgeon: Lyn Records, MD;  Location: [facility] INVASIVE CV LAB;  Service: Cardiovascular;  Laterality: N/A;  . CORONARY ARTERY BYPASS GRAFT N/A 08/09/2016   Procedure: CORONARY ARTERY BYPASS GRAFTING (CABG) x three,SVG to distal circ OM3, SVG to Om1, LIMA to LAD-using left internal mammary artery and right leg greater saphenous vein harvested endoscopically, Sternal plating - right side;  Surgeon: Delight Ovens, MD;  Location: [facility] OR;  Service: Open Heart Surgery;  Laterality: N/A;  . FACIAL RECONSTRUCTION SURGERY  2009   "facial  fractures"  . FRACTURE SURGERY    . LAPAROSCOPIC CHOLECYSTECTOMY    . TEE WITHOUT CARDIOVERSION N/A 08/09/2016   Procedure: TRANSESOPHAGEAL ECHOCARDIOGRAM (TEE);  Surgeon: Delight Ovens, MD;  Location: [facility] OR;  Service: Open Heart Surgery;  Laterality: N/A;  . TUBAL LIGATION      There were no vitals filed for this visit.      Subjective Assessment - 09/25/16 1752    Subjective  Pt reports performing laundry at home   Limitations sternal precautions    Patient Stated Goals increase endurance and return to work   Currently in Pain? Yes   Pain Score 2    Pain Location Head   Pain Orientation Anterior   Pain Descriptors / Indicators Headache   Pain Type Acute pain   Pain Frequency Intermittent   Aggravating Factors  unknown   Pain Relieving Factors unknown       Treatment: Reviewed red putty HEP for grip and pinch with pt. Discussed importance of HEP with pt. Organization task (planning breakfast for 14 people) increased time and pt initially demonstrated difficulty with organizing task.However pt completed correctly with only min v.c. And 1 number transposed.                        OT Education - 09/25/16 1637    Education provided Yes   Education Details memory compensation strategies, importance of putty ex   Person(s) Educated Patient;Spouse   Methods  Explanation   Comprehension Verbalized understanding;Returned demonstration             OT Long Term Goals - 09/25/16 1622      OT LONG TERM GOAL #1   Title Independent with HEP for coordination and strength RUE   Time 4   Period Weeks   Status Achieved     OT LONG TERM GOAL #2   Title Improve coordination as evidenced by reducing speed on 9 hole peg test to 28 sec. or less   Time 4   Period Weeks   Status On-going  28.22 secs,  26.03 secs     OT LONG TERM GOAL #3   Title Improve grip strength Rt hand to 60 lbs or greater to open jars/containers   Time 4   Period Weeks   Status  On-going  45, 62 inconsistent     OT LONG TERM GOAL #4   Title Pt to verbalize understanding with memory compensatory strategies   Time 4   Period Weeks   Status Achieved  Pt and husband verbalize understanding     OT LONG TERM GOAL #5   Title Pt to return to light IADLS including meal prep and light cleaning mod I level for 30 minute without rest   Time 4   Period Weeks   Status On-going     OT LONG TERM GOAL #6   Title Pt to perform complex organizational and problem solving tasks with extra time and external aids prn, but no cueing at 90% or greater accuracy in prep for work related tasks   Time 4   Period Weeks   Status On-going               Plan - 09/25/16 1721    Clinical Impression Statement Pt is progressing towards goals despite recent BP issues which impacted pt participation in therapy.   Rehab Potential Good   OT Frequency 2x / week   OT Duration 4 weeks   OT Treatment/Interventions Self-care/ADL training;DME and/or AE instruction;Moist Heat;Fluidtherapy;Patient/family education;Therapeutic exercises;Therapeutic activities;Neuromuscular education;Functional Mobility Training;Passive range of motion;Cognitive remediation/compensation;Electrical Stimulation;Energy conservation;Manual Therapy;Visual/perceptual remediation/compensation   Plan  consider adding more therapy visits if needed, pt is only schedule through first week of March, kitchen activity to increase activity tolerance, goal 30 mins   Consulted and Agree with Plan of Care Patient;Family member/caregiver   Family Member Consulted husband      Patient will benefit from skilled therapeutic intervention in order to improve the following deficits and impairments:  Cardiopulmonary status limiting activity, Decreased coordination, Decreased endurance, Impaired sensation, Decreased activity tolerance, Decreased knowledge of precautions, Decreased knowledge of use of DME, Impaired UE functional use,  Decreased cognition, Decreased mobility, Decreased strength, Impaired perceived functional ability  Visit Diagnosis: Hemiplegia and hemiparesis following cerebral infarction affecting right dominant side (HCC)  Muscle weakness (generalized)  Other lack of coordination    Problem List Patient Active Problem List   Diagnosis Date Noted  . Atypical chest pain   . Stroke due to embolism (HCC) 08/19/2016  . Migraine without status migrainosus, not intractable   . Obesity (BMI 30.0-34.9)   . Tachypnea   . Hypoalbuminemia due to protein-calorie malnutrition (HCC)   . Leukocytosis   . Acute blood loss anemia   . Pressure injury of skin 08/12/2016  .  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 Essential hypertension 08/07/2016  . Insulin dependent diabetes mellitus with complications (HCC) 08/07/2016  . Non-STEMI (non-ST elevated myocardial infarction) (  HCC) 08/07/2016    [name] 09/25/2016, 6:04 PM  [facility] [address] [place], [place], [address] Phone: [phone]   Fax:  [phone]  Name: [name] MRN: [id number] Date of Birth: 10/30/1970

## 2016-09-26 ENCOUNTER — Ambulatory Visit
Admission: RE | Admit: 2016-09-26 | Discharge: 2016-09-26 | Disposition: A | Discharge status: Home / Self Care | Payer: BLUE CROSS/BLUE SHIELD | Source: Ambulatory Visit | Attending: Cardiothoracic Surgery | Admitting: Cardiothoracic Surgery

## 2016-09-26 ENCOUNTER — Encounter: Payer: Self-pay | Admitting: Cardiothoracic Surgery

## 2016-09-26 ENCOUNTER — Ambulatory Visit (INDEPENDENT_AMBULATORY_CARE_PROVIDER_SITE_OTHER): Payer: Self-pay | Admitting: Cardiothoracic Surgery

## 2016-09-26 VITALS — BP 150/79 | HR 55 | Resp 20 | Ht 59.5 in | Wt 170.0 lb

## 2016-09-26 DIAGNOSIS — Z951 Presence of aortocoronary bypass graft: Secondary | ICD-10-CM

## 2016-09-26 NOTE — Progress Notes (Signed)
[address]       [place] [address]             [phone]      [name] Medical Record #[id number] Date of Birth: 13-Nov-1970  Referring: Minus Breeding, MD Primary Care: No PCP Per Patient  Chief Complaint:   POST OP FOLLOW UP 08/09/2016   OPERATIVE REPORT POSTOPERATIVE DIAGNOSIS:  Coronary occlusive disease with unstable angina. POSTOPERATIVE DIAGNOSIS:  Coronary occlusive disease with unstable angina. SURGICAL PROCEDURE:  Coronary artery bypass grafting x3 with left internal mammary to the left anterior descending coronary artery, reverse saphenous vein graft to the first obtuse marginal, reverse saphenous vein graft to the third obtuse marginal with right leg, thigh, and calf, greater saphenous vein harvesting endoscopically.  Sternal plating, right side of sternum. SURGEON:  Lanelle Bal, M.D.   History of Present Illness:      Patient returns to the office today after recent coronary artery bypass grafting 3 done January 12. Postoperatively the patient had neurologic changes that were episodic primarily involving the right arm and leg tomorrow is her last day of physical therapy she notes she had almost complete recovery nail denies any weakness in her arms or legs has some tingling in the right foot..   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   Past Medical History:  Diagnosis Date  . Headache    due to elevated  BP  . History of kidney stones    "passed them"  . Hypertension   . Pancreatic injury    "damage to pancrease related to gallbladder OR"  . Pneumonia X 1  . Type II diabetes mellitus (HCC)      History  Smoking Status  . Former Smoker  . Packs/day: 0.50  . Years: 20.00  . Types: Cigarettes  Smokeless Tobacco  . Never Used    History  Alcohol Use No     Allergies    Allergen Reactions  . Eugenol Hives and Other (See Comments)    Burns mouth  . Hydrochlorothiazide Anaphylaxis  . Morphine And Related Anaphylaxis  . Percocet [Oxycodone-Acetaminophen] Anaphylaxis and Nausea And Vomiting  . Shellfish Allergy Anaphylaxis  . Sulfa Antibiotics Anaphylaxis  . Tomato Anaphylaxis    Current Outpatient Prescriptions  Medication Sig Dispense Refill  . aspirin EC 325 MG EC tablet Take 1 tablet (325 mg total) by mouth daily. 30 tablet 0  . atorvastatin (LIPITOR) 80 MG tablet Take 1 tablet (80 mg total) by mouth daily at 6 PM. 30 tablet 0  . B-D UF III MINI PEN NEEDLES 31G X 5 MM MISC     . blood glucose meter kit and supplies KIT Dispense based on patient and insurance preference. Use up to four times daily as directed. (FOR ICD-9 250.00, 250.01). 1 each 0  . cloNIDine (CATAPRES) 0.2 MG tablet Take 0.2 mg by mouth daily.    . clopidogrel (PLAVIX) 75 MG tablet Take 1 tablet (75 mg total) by mouth daily. 30 tablet 0  . Insulin Detemir (LEVEMIR) 100 UNIT/ML Pen Inject 30 Units into the skin daily after breakfast. (Patient taking differently: Inject 30 Units into the skin daily at 10 pm. ) 15 mL 0  . Insulin Pen Needle (CLICKFINE PEN NEEDLES) 31G X 8 MM MISC 1 application by Does not apply route as directed. 100  each 0  . lisinopril (PRINIVIL,ZESTRIL) 20 MG tablet Take 1 tablet (20 mg total) by mouth daily. 90 tablet 3  . metoprolol tartrate (LOPRESSOR) 25 MG tablet Take 1 tablet (25 mg total) by mouth 2 (two) times daily. 60 tablet 0  . nitroGLYCERIN (NITROSTAT) 0.4 MG SL tablet Place 1 tablet (0.4 mg total) under the tongue every 5 (five) minutes as needed for chest pain. Max 3 doses--call MD if you need to use 2 or more pills 30 tablet 0  . NOVOLOG 100 UNIT/ML injection Takes as a Sliding Scale prn    . ONETOUCH VERIO test strip     . pantoprazole (PROTONIX) 40 MG tablet Take 1 tablet (40 mg total) by mouth daily before breakfast. 30 tablet 0  . senna-docusate  (SENOKOT-S) 8.6-50 MG tablet Take 1 tablet by mouth at bedtime. 30 tablet 0  . traMADol (ULTRAM) 50 MG tablet Take 1 tablet (50 mg total) by mouth every 12 (twelve) hours as needed for moderate pain. 15 tablet 0  . furosemide (LASIX) 40 MG tablet Take 1 tablet (40 mg total) by mouth daily. 30 tablet 0   No current facility-administered medications for this visit.        Physical Exam: BP (!) 150/79   Pulse (!) 55   Resp 20   Ht 4' 11.5" (1.511 m)   Wt 170 lb (77.1 kg)   SpO2 98% Comment: RA  BMI 33.76 kg/m   General appearance: alert and cooperative Neurologic: intact Heart: regular rate and rhythm, S1, S2 normal, no murmur, click, rub or gallop Lungs: clear to auscultation bilaterally Abdomen: soft, non-tender; bowel sounds normal; no masses,  no organomegaly Extremities: extremities normal, atraumatic, no cyanosis or edema and Homans sign is negative, no sign of DVT Wound: Sternum is stable and well-healed vein harvest sites are also well-healed   Diagnostic Studies & Laboratory data:     Recent Radiology Findings:   Dg Chest 2 View  Result Date: 09/26/2016 CLINICAL DATA:  History of CABG in January 2018.  Follow-up study. EXAM: CHEST  2 VIEW COMPARISON:  August 20, 2016 chest x-ray. FINDINGS: The right lung is adequately inflated and clear. On the left there remains a small left pleural effusion which has decreased in size since the previous study. Left basilar atelectasis has largely cleared. The heart is normal in size. The pulmonary vascularity is prominent centrally but there is no pulmonary edema. The sternal wires are intact. The bony thorax exhibits no acute abnormality. IMPRESSION: Improved appearance of both lungs with resolution of the right pleural effusion and decrease in the volume of the left pleural effusion. Clearing of atelectasis at the left lung base. Mild central pulmonary vascular congestion persists. Electronically Signed   By: David  Martinique M.D.   On:  09/26/2016 12:08      Recent Lab Findings: Lab Results  Component Value Date   WBC 12.4 (H) 08/20/2016   HGB 10.1 (L) 08/20/2016   HCT 31.1 (L) 08/20/2016   PLT 670 (H) 08/20/2016   GLUCOSE 146 (H) 09/18/2016   CHOL 120 08/08/2016   TRIG 89 08/08/2016   HDL 35 (L) 08/08/2016   LDLCALC 67 08/08/2016   ALT 28 08/20/2016   AST 25 08/20/2016   NA 140 09/18/2016   K 4.2 09/18/2016   CL 107 09/18/2016   CREATININE 0.72 09/18/2016   BUN 11 09/18/2016   CO2 26 09/18/2016   INR 1.53 08/09/2016   HGBA1C 13.1 (H) 08/08/2016  Assessment / Plan:      Patient making good progress following coronary artery bypass grafting approximate 6 weeks ago. Early postoperatively she had neurologic deficit primarily involving the right arm and leg. This appears to almost completely have resolved. We will allow the patient a return to work in 2 weeks approximate 8 weeks postop and work half a day for 2 weeks and then full-time. She understands she is to do no heavy lifting for 3 months She will start in cardiac rehabilitation now that she is completed her course of physical therapy.  Will see back as needed   Grace Isaac MD      [address] [place] [address] Office [phone]   Beeper [phone]  09/26/2016 12:16 PM

## 2016-09-27 ENCOUNTER — Ambulatory Visit: Payer: BLUE CROSS/BLUE SHIELD | Admitting: Physical Therapy

## 2016-09-27 ENCOUNTER — Ambulatory Visit: Payer: BLUE CROSS/BLUE SHIELD | Attending: Physical Medicine & Rehabilitation | Admitting: Occupational Therapy

## 2016-09-27 ENCOUNTER — Encounter: Payer: Self-pay | Admitting: Occupational Therapy

## 2016-09-27 ENCOUNTER — Encounter: Payer: Self-pay | Admitting: Physical Therapy

## 2016-09-27 VITALS — BP 173/92

## 2016-09-27 VITALS — BP 180/90 | HR 50

## 2016-09-27 DIAGNOSIS — M6281 Muscle weakness (generalized): Secondary | ICD-10-CM

## 2016-09-27 DIAGNOSIS — I69318 Other symptoms and signs involving cognitive functions following cerebral infarction: Secondary | ICD-10-CM | POA: Diagnosis present

## 2016-09-27 DIAGNOSIS — R2689 Other abnormalities of gait and mobility: Secondary | ICD-10-CM

## 2016-09-27 DIAGNOSIS — I69351 Hemiplegia and hemiparesis following cerebral infarction affecting right dominant side: Secondary | ICD-10-CM | POA: Diagnosis present

## 2016-09-27 DIAGNOSIS — R278 Other lack of coordination: Secondary | ICD-10-CM | POA: Insufficient documentation

## 2016-09-27 NOTE — Therapy (Signed)
[facility] [address] [place], [place], [address] Phone: [phone]   Fax:  [phone]  Physical Therapy Treatment  Patient Details  Name: [name] MRN: [id number] Date of Birth: 02-09-1971 Referring Provider: Erick Colace, MD  Encounter Date: 09/27/2016      PT End of Session - 09/27/16 1627    Visit Number 7   Number of Visits 17   Date for PT Re-Evaluation 10/26/16   Authorization Type BCBS   PT Start Time 1532   PT Stop Time 1618   PT Time Calculation (min) 46 min   Activity Tolerance Treatment limited secondary to medical complications (Comment)  elevated BP after activity, multiple rest breaks   Behavior During Therapy [facility] for tasks assessed/performed      Past Medical History:  Diagnosis Date  . Headache    due to elevated  BP  . History of kidney stones    "passed them"  . Hypertension   . Pancreatic injury    "damage to pancrease related to gallbladder OR"  . Pneumonia X 1  . Type II diabetes mellitus (HCC)     Past Surgical History:  Procedure Laterality Date  . CARDIAC CATHETERIZATION  08/07/2016  . CARDIAC CATHETERIZATION N/A 08/07/2016   Procedure: Left Heart Cath and Coronary Angiography;  Surgeon: Lyn Records, MD;  Location: [facility] INVASIVE CV LAB;  Service: Cardiovascular;  Laterality: N/A;  . CARDIAC CATHETERIZATION N/A 08/07/2016   Procedure: Intravascular Pressure Wire/FFR Study;  Surgeon: Lyn Records, MD;  [facility] INVASIVE CV LAB;  Service: Cardiovascular;  Laterality: N/A;  . CORONARY ARTERY BYPASS GRAFT N/A 08/09/2016   Procedure: CORONARY ARTERY BYPASS GRAFTING (CABG) x three,SVG to distal circ OM3, SVG to Om1, LIMA to LAD-using left internal mammary artery and right leg greater saphenous vein harvested endoscopically, Sternal plating - right side;  Surgeon: Delight Ovens, MD;  Location: [facility] OR;  Service: Open Heart Surgery;  Laterality: N/A;  . FACIAL  RECONSTRUCTION SURGERY  2009   "facial fractures"  . FRACTURE SURGERY    . LAPAROSCOPIC CHOLECYSTECTOMY    . TEE WITHOUT CARDIOVERSION N/A 08/09/2016   Procedure: TRANSESOPHAGEAL ECHOCARDIOGRAM (TEE);  Surgeon: Delight Ovens, MD;  Location: [facility] OR;  Service: Open Heart Surgery;  Laterality: N/A;  . TUBAL LIGATION      Vitals:   09/27/16 1552 09/27/16 1558  BP: (!) 190/98 (!) 180/90  Pulse: (!) 59 (!) 50        Subjective Assessment - 09/27/16 1543    Subjective Pt just finished with OT; asking about when to start cardiac rehabilitation.  Had visit with cardiac surgeon-sternal incision healing well.   Patient is accompained by: Family member   Pertinent History Hold exercise if BP goes above 160/100 per cardiologist recommendations.   Limitations Lifting   Patient Stated Goals "Be back to normal and be able to do more for myself" Would like to be more independent   Currently in Pain? No/denies           [facility] Adult PT Treatment/Exercise - 09/27/16 1553      Ambulation/Gait   Stairs Yes   Stairs Assistance 6: Modified independent (Device/Increase time)   Stair Management Technique No rails;Alternating pattern;Forwards   Number of Stairs 12   Height of Stairs 6     Knee/Hip Exercises: Standing   Lateral Step Up Right;Left;10 reps;Step Height: 6"   Forward Step Up Both;10 reps;Step Height: 6"   Step Down Both;10  reps;Step Height: 6"          Balance Exercises - 09/27/16 1606      OTAGO PROGRAM   Hip ABductor 10 reps  each LE   Ankle Plantorflexors 20 reps, no support   Ankle Dorsiflexors 20 reps, no support   Knee Bends 20 reps, support   Overall OTAGO Comments Squat x 10 reps, no UE support and alternating marches x 20 reps with no UE support           PT Education - 09/27/16 1626    Education provided Yes   Education Details assessment of BP at home, HEP in standing, cardiac rehabilitation and PT POC for next few visits   Person(s) Educated Patient    Methods Explanation;Demonstration;Handout   Comprehension Verbalized understanding;Returned demonstration          PT Short Term Goals - 09/25/16 1529      PT SHORT TERM GOAL #1   Title TARGET FOR ALL STG 09/25/2016-Pt will initiate HEP and demonstrate independence with performing    Status Achieved     PT SHORT TERM GOAL #2   Title Pt will participate in 6 minute walk test of endurance with LTG to be set.   Baseline 09/03/16: 969 feet with no AD or rest breaks taken   Status Achieved     PT SHORT TERM GOAL #3   Title Pt will participate in Functional Gait Assessment (FGA) and LTG to be set.   Baseline 09/03/16: 18/30 for baseline values   Status Achieved     PT SHORT TERM GOAL #4   Title Pt will negotiate 8 stairs, alternating sequence supervision with no UE support   Baseline 12 steps, no rails, alternating sequence supervision on 09/25/2016   Status Achieved     PT SHORT TERM GOAL #5   Title Pt will demonstrate lowered fall risk as indicated by a BERG balance score of 55/56.   Baseline 55/56 on 09/25/2016   Status Achieved           PT Long Term Goals - 08/27/16 1041      PT LONG TERM GOAL #1   Title TARGET DATE FOR ALL LTG 10/26/16: Pt will improve RLE strength to 5/5 overall   Status New     PT LONG TERM GOAL #2   Title Pt will improve FGA score by 4.2 points to demonstrate decreased falls risk during community ambulation.   Status New     PT LONG TERM GOAL #3   Title Pt will improve gait velocity to >22ft/sec for increased independence with community gait.     PT LONG TERM GOAL #4   Title Pt will improve 6 minute by 18ft to demonstrate improvement in functional endurance.   Status New     PT LONG TERM GOAL #5   Title Pt will negotiate >500 over level and uneven surfaces (indoor/outdoor) and will negotiate 10 stairs at mod I level    Status New               Plan - 09/27/16 1629    Clinical Impression Statement Pt just finished with OT; BP noted to be  elevated after activity today.  While resting discussed cardiac rehab vs neuro rehabilitation. Pt has referral for cardiac rehab, will contact cardiac to discuss transition.  Rest of session focused on LE functional strengthening, endurance, dynamic balance training, stair training and standing HEP-pt has been performing bed level exercises.  Pt required multiple rest breaks  due to elevated BP.   Rehab Potential Good   Clinical Impairments Affecting Rehab Potential Cardipulmonary limitations   PT Frequency 2x / week   PT Duration 4 weeks   PT Treatment/Interventions ADLs/Self Care Home Management;Moist Heat;DME Instruction;Gait training;Stair training;Functional mobility training;Therapeutic activities;Therapeutic exercise;Balance training;Neuromuscular re-education;Patient/family education;Energy conservation;Taping   PT Next Visit Plan MONITOR VITALS at every visit;  continued to work on LE strengthening, high level balance (on compliant surfaces and single leg stance) and activity tolerance/endurance     Consulted and Agree with Plan of Care Patient      Patient will benefit from skilled therapeutic intervention in order to improve the following deficits and impairments:  Abnormal gait, Cardiopulmonary status limiting activity, Decreased endurance, Decreased balance, Decreased coordination, Decreased strength, Difficulty walking, Impaired sensation  Visit Diagnosis: Hemiplegia and hemiparesis following cerebral infarction affecting right dominant side (HCC)  Muscle weakness (generalized)  Other abnormalities of gait and mobility     Problem List Patient Active Problem List   Diagnosis Date Noted  . Atypical chest pain   . Stroke due to embolism (HCC) 08/19/2016  . Migraine without status migrainosus, not intractable   . Obesity (BMI 30.0-34.9)   . Tachypnea   . Hypoalbuminemia due to protein-calorie malnutrition (HCC)   . Leukocytosis   . Acute blood loss anemia   . Pressure  injury of skin 08/12/2016  .  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 Essential hypertension 08/07/2016  . Insulin dependent diabetes mellitus with complications (HCC) 08/07/2016  . Non-STEMI (non-ST elevated myocardial infarction) (HCC) 08/07/2016   Edman CircleAudra Hall, PT, DPT 09/27/16    4:35 PM   [facility] [address] [place], [place], [address] Phone: [phone]   Fax:  [phone]  Name: [name] MRN: [id number] Date of Birth: 02/09/1971

## 2016-09-27 NOTE — Patient Instructions (Signed)
Toe / Heel Raise (Standing)    Standing with support, raise heels, then rock back on heels and raise toes. Repeat _12___ times.  Copyright  VHI. All rights reserved.   Standing Marching   Using a chair if necessary, march in place. Repeat 12 times. Do 2-3 sessions per day.    Hip Side Kick   Holding a chair for balance, keep legs shoulder width apart and toes pointed forward. Swing a leg out to side, keeping knee straight. Do not lean. Repeat using other leg. Repeat 12 times. Do 2-3 sessions per day.   Standing Knee bends .  Stand on one leg in neutral spine without support. Bend opposite heel up towards buttocks.  Repeat on other leg. Do 12 repetitions each leg, 2-3 sets a day.

## 2016-09-27 NOTE — Therapy (Signed)
[facility] [address] [place], [place], [address] Phone: [phone]   Fax:  [phone]  Occupational Therapy Treatment  Patient Details  Name: [name] MRN: [id number] Date of Birth: 01/13/1971 Referring Provider: Dr. Claudette LawsAndrew Kirsteins  Encounter Date: 09/27/2016      OT End of Session - 09/27/16 1648    Visit Number 5   Number of Visits 9   Date for OT Re-Evaluation 09/27/16   OT Start Time 1445   OT Stop Time 1530   OT Time Calculation (min) 45 min   Activity Tolerance Patient tolerated treatment well   Behavior During Therapy [facility] for tasks assessed/performed      Past Medical History:  Diagnosis Date  . Headache    due to elevated  BP  . History of kidney stones    "passed them"  . Hypertension   . Pancreatic injury    "damage to pancrease related to gallbladder OR"  . Pneumonia X 1  . Type II diabetes mellitus (HCC)     Past Surgical History:  Procedure Laterality Date  . CARDIAC CATHETERIZATION  08/07/2016  . CARDIAC CATHETERIZATION N/A 08/07/2016   Procedure: Left Heart Cath and Coronary Angiography;  Surgeon: Lyn RecordsHenry W Smith, MD;  [facility];  Service: Cardiovascular;  Laterality: N/A;  . CARDIAC CATHETERIZATION N/A 08/07/2016   Procedure: Intravascular Pressure Wire/FFR Study;  Surgeon: Lyn RecordsHenry W Smith, MD;  [facility];  Service: Cardiovascular;  Laterality: N/A;  . CORONARY ARTERY BYPASS GRAFT N/A 08/09/2016   Procedure: CORONARY ARTERY BYPASS GRAFTING (CABG) x three,SVG to distal circ OM3, SVG to Om1, LIMA to LAD-using left internal mammary artery and right leg greater saphenous vein harvested endoscopically, Sternal plating - right side;  Surgeon: Delight OvensEdward B Gerhardt, MD;  [facility];  Service: Open Heart Surgery;  Laterality: N/A;  . FACIAL RECONSTRUCTION SURGERY  2009   "facial fractures"  . FRACTURE SURGERY    . LAPAROSCOPIC CHOLECYSTECTOMY     . TEE WITHOUT CARDIOVERSION N/A 08/09/2016   Procedure: TRANSESOPHAGEAL ECHOCARDIOGRAM (TEE);  Surgeon: Delight OvensEdward B Gerhardt, MD;  [facility];  Service: Open Heart Surgery;  Laterality: N/A;  . TUBAL LIGATION      Vitals:   09/27/16 1531 09/27/16 1532 09/27/16 1533  BP: (!) 181/94 (!) 192/103 (!) 173/92        Subjective Assessment - 09/27/16 1643    Subjective  Patient excited about her recent MD appointment, eager to start cardiac rehab, and return to work   Patient is accompained by: Family member   Limitations sternal precautions    Patient Stated Goals increase endurance and return to work   Currently in Pain? No/denies   Pain Score 0-No pain                      OT Treatments/Exercises (OP) - 09/27/16 0001      ADLs   Cooking Used simultaneous cooking and laundry task to encourage light housewrok with goal of being active about 30 minutes.  Patient able to prepare a familiar rice dish, and fold laundry, clean kitchEN.  After 15 then 25 min of light activity BP taken with significant increase.  See vitals.  Patient downplays any deficit if she thinks it wil interfere with her return to work,.  After 5 min seated rest break, BP returned to safe level.  Shared vitals with PT.  Reinforced for patient the benefit of alteranting activity  and brief rest breaks to condition herself to a more typical activity level.  Encouraged patient to purchase a home BP monitor.     ADL Comments Patient's mom is going back home- patient, will need to return to more home activities at this time.               Balance Exercises - 09/27/16 1606      OTAGO PROGRAM   Hip ABductor 10 reps  each LE   Ankle Plantorflexors 20 reps, no support   Ankle Dorsiflexors 20 reps, no support   Knee Bends 20 reps, support   Overall OTAGO Comments Squat x 10 reps, no UE support and alternating marches x 20 reps with no UE support           OT Education - 09/27/16 1648    Education  provided Yes   Education Details activity and rest, monitor BP   Person(s) Educated Patient;Parent(s)   Methods Explanation;Demonstration   Comprehension Verbalized understanding             OT Long Term Goals - 09/27/16 1651      OT LONG TERM GOAL #5   Status Achieved               Plan - 09/27/16 1649    Clinical Impression Statement Patient is showing steady improvement in particiaption and activity tolerance for ADL/IADL   Rehab Potential Good   OT Frequency 2x / week   OT Duration 4 weeks   OT Treatment/Interventions Self-care/ADL training;DME and/or AE instruction;Moist Heat;Fluidtherapy;Patient/family education;Therapeutic exercises;Therapeutic activities;Neuromuscular education;Functional Mobility Training;Passive range of motion;Cognitive remediation/compensation;Electrical Stimulation;Energy conservation;Manual Therapy;Visual/perceptual remediation/compensation   Plan Balance physical and cognitive activity as in work place - climb stairs, review orders, office work   [employer] and Agree with Plan of Care Patient;Family member/caregiver   Family Member Consulted mother      Patient will benefit from skilled therapeutic intervention in order to improve the following deficits and impairments:  Cardiopulmonary status limiting activity, Decreased coordination, Decreased endurance, Impaired sensation, Decreased activity tolerance, Decreased knowledge of precautions, Decreased knowledge of use of DME, Impaired UE functional use, Decreased cognition, Decreased mobility, Decreased strength, Impaired perceived functional ability  Visit Diagnosis: Hemiplegia and hemiparesis following cerebral infarction affecting right dominant side (HCC)  Muscle weakness (generalized)  Other lack of coordination  Other symptoms and signs involving cognitive functions following cerebral infarction    Problem List Patient Active Problem List   Diagnosis Date Noted  . Atypical  chest pain   . Stroke due to embolism (HCC) 08/19/2016  . Migraine without status migrainosus, not intractable   . Obesity (BMI 30.0-34.9)   . Tachypnea   . Hypoalbuminemia due to protein-calorie malnutrition (HCC)   . Leukocytosis   . Acute blood loss anemia   . Pressure injury of skin 08/12/2016  .  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 Essential hypertension 08/07/2016  . Insulin dependent diabetes mellitus with complications (HCC) 08/07/2016  . Non-STEMI (non-ST elevated myocardial infarction) (HCC) 08/07/2016    Collier Salina, OTR/L 09/27/2016, 4:52 PM  [facility] [address] [place], [place], [address] Phone: [phone]   Fax:  [phone]  Name: Jennene Downie MRN: [id number] Date of Birth: 14-May-1971

## 2016-09-30 ENCOUNTER — Telehealth (HOSPITAL_COMMUNITY): Payer: Self-pay | Admitting: General Practice

## 2016-09-30 ENCOUNTER — Telehealth: Payer: Self-pay

## 2016-09-30 NOTE — Telephone Encounter (Signed)
[employer] form done by patient. Form/release attach. Form done by Dr. Abbie SonsXu.Pt was seen in the hospital. Has appt in March 2018 with Carolyn(NP). Form sent to Stanton KidneyDebra in medical records for payment.

## 2016-09-30 NOTE — Telephone Encounter (Signed)
Verified [employer] insurance benefits through [employer]. No Co-Pay or Co-Insurance, Deductible & Out of Pocket both are $2700.00 Both Deductible and OOP have been met.... Reference # [id number]... KJ

## 2016-10-01 ENCOUNTER — Ambulatory Visit: Payer: BLUE CROSS/BLUE SHIELD | Admitting: Occupational Therapy

## 2016-10-01 ENCOUNTER — Encounter: Payer: Self-pay | Admitting: Occupational Therapy

## 2016-10-01 VITALS — BP 174/96

## 2016-10-01 DIAGNOSIS — I69318 Other symptoms and signs involving cognitive functions following cerebral infarction: Secondary | ICD-10-CM

## 2016-10-01 DIAGNOSIS — I69351 Hemiplegia and hemiparesis following cerebral infarction affecting right dominant side: Secondary | ICD-10-CM

## 2016-10-01 DIAGNOSIS — R278 Other lack of coordination: Secondary | ICD-10-CM

## 2016-10-01 DIAGNOSIS — M6281 Muscle weakness (generalized): Secondary | ICD-10-CM

## 2016-10-01 NOTE — Therapy (Signed)
[facility] [address] [place], [place], [address] Phone: [phone]   Fax:  [phone]  Occupational Therapy Treatment  Patient Details  Name: [name] MRN: [id number] Date of Birth: [birth year] Referring Provider: Dr. Claudette Laws  Encounter Date: 10/01/2016      OT End of Session - 10/01/16 1641    Visit Number 6   Number of Visits 9   Date for OT Re-Evaluation 09/27/16   Authorization Type BC/BS   OT Start Time 1535   OT Stop Time 1630   OT Time Calculation (min) 55 min   Activity Tolerance Patient tolerated treatment well   Behavior During Therapy [facility] for tasks assessed/performed      Past Medical History:  Diagnosis Date  . Headache    due to elevated  BP  . History of kidney stones    "passed them"  . Hypertension   . Pancreatic injury    "damage to pancrease related to gallbladder OR"  . Pneumonia X 1  . Type II diabetes mellitus (HCC)     Past Surgical History:  Procedure Laterality Date  . CARDIAC CATHETERIZATION  08/07/2016  . CARDIAC CATHETERIZATION N/A 08/07/2016   Procedure: Left Heart Cath and Coronary Angiography;  Surgeon: Lyn Records, MD;  Location: [facility] INVASIVE CV LAB;  Service: Cardiovascular;  Laterality: N/A;  . CARDIAC CATHETERIZATION N/A 08/07/2016   Procedure: Intravascular Pressure Wire/FFR Study;  Surgeon: Lyn Records, MD;  Location: [facility] INVASIVE CV LAB;  Service: Cardiovascular;  Laterality: N/A;  . CORONARY ARTERY BYPASS GRAFT N/A 08/09/2016   Procedure: CORONARY ARTERY BYPASS GRAFTING (CABG) x three,SVG to distal circ OM3, SVG to Om1, LIMA to LAD-using left internal mammary artery and right leg greater saphenous vein harvested endoscopically, Sternal plating - right side;  Surgeon: Delight Ovens, MD;  Location: [facility] OR;  Service: Open Heart Surgery;  Laterality: N/A;  . FACIAL RECONSTRUCTION SURGERY  2009   "facial fractures"  . FRACTURE SURGERY    .  LAPAROSCOPIC CHOLECYSTECTOMY    . TEE WITHOUT CARDIOVERSION N/A 08/09/2016   Procedure: TRANSESOPHAGEAL ECHOCARDIOGRAM (TEE);  Surgeon: Delight Ovens, MD;  Location: [facility] OR;  Service: Open Heart Surgery;  Laterality: N/A;  . TUBAL LIGATION      Vitals:   10/01/16 1538 10/01/16 1633 10/01/16 1634  BP: (!) 174/92 (!) 191/85 (!) 174/96        Subjective Assessment - 10/01/16 1538    Subjective  They told me to take it easy- I have been taking it easy.     Patient is accompained by: Family member   Limitations sternal precautions    Patient Stated Goals increase endurance and return to work   Currently in Pain? No/denies   Pain Score 0-No pain                      OT Treatments/Exercises (OP) - 10/01/16 0001      ADLs   Home Maintenance Patient's husband, [name], present for this session.  Husband expressing concern about patient's current activity level.   He indicates she lacks interest in activities she used to enjoy - now spending too much time in bed, playing on her phone.  Had a long discussion with patient and husband about the importance of starting to build tolerance for IADL/ general activity level prior to return to work  Patient needed moderate prompting to come up with activities to fill her day - simple housekeeping tasks.  Worked with patient in husband' spresence to establish a daily schedule with short seated rest breaks versus napping during daytime hours.        Neck Exercises: Machines for Strengthening   UBE (Upper Arm Bike) 2.5 min forward, 2.5 min backward level 3.0 with increase in BP     Neurological Re-education Exercises   Other Exercises 1 Neuromuscular reeducation to address end range shoulder motion.  Patient with limitation at end range shoulder flexion, abduction, horizontal adduction - patient with muscle imbalance and proximal weakness - seems to have developed some capsular tightness.     Other Exercises 2 Initiated HEP to address  shoulder active motion, proximal control.                  OT Education - 10/01/16 1640    Education provided Yes   Education Details Balance activity and rest,  HEP- to address right UE function   Person(s) Educated Patient;Spouse   Methods Explanation;Demonstration   Comprehension Verbalized understanding;Returned demonstration;Verbal cues required;Need further instruction             OT Long Term Goals - 10/01/16 1651      OT LONG TERM GOAL #1   Title Independent with HEP for coordination and strength RUE   Status Achieved     OT LONG TERM GOAL #2   Title Improve coordination as evidenced by reducing speed on 9 hole peg test to 28 sec. or less   Status On-going     OT LONG TERM GOAL #3   Title Improve grip strength Rt hand to 60 lbs or greater to open jars/containers   Status On-going     OT LONG TERM GOAL #4   Title Pt to verbalize understanding with memory compensatory strategies   Status Achieved     OT LONG TERM GOAL #5   Title Pt to return to light IADLS including meal prep and light cleaning mod I level for 30 minute without rest   Status On-going     OT LONG TERM GOAL #6   Title Pt to perform complex organizational and problem solving tasks with extra time and external aids prn, but no cueing at 90% or greater accuracy in prep for work related tasks   Status On-going               Plan - 10/01/16 1642    Clinical Impression Statement Patient is participating in therapy, however, is having difficulty increasing her activity level with IADL at home.   Rehab Potential Good   OT Frequency 2x / week   OT Duration 4 weeks   OT Treatment/Interventions Self-care/ADL training;DME and/or AE instruction;Moist Heat;Fluidtherapy;Patient/family education;Therapeutic exercises;Therapeutic activities;Neuromuscular education;Functional Mobility Training;Passive range of motion;Cognitive remediation/compensation;Electrical Stimulation;Energy  conservation;Manual Therapy;Visual/perceptual remediation/compensation   Plan Balance physical and cognitive activity as in work place, check HEP   Consulted and Agree with Plan of Care Patient;Family member/caregiver   Family Member Consulted Husband      Patient will benefit from skilled therapeutic intervention in order to improve the following deficits and impairments:  Cardiopulmonary status limiting activity, Decreased coordination, Decreased endurance, Impaired sensation, Decreased activity tolerance, Decreased knowledge of precautions, Decreased knowledge of use of DME, Impaired UE functional use, Decreased cognition, Decreased mobility, Decreased strength, Impaired perceived functional ability  Visit Diagnosis: Hemiplegia and hemiparesis following cerebral infarction affecting right dominant side (HCC)  Muscle weakness (generalized)  Other lack of coordination  Other symptoms and signs involving cognitive functions following cerebral infarction  Problem List Patient Active Problem List   Diagnosis Date Noted  . Atypical chest pain   . Stroke due to embolism (HCC) 08/19/2016  . Migraine without status migrainosus, not intractable   . Obesity (BMI 30.0-34.9)   . Tachypnea   . Hypoalbuminemia due to protein-calorie malnutrition (HCC)   . Leukocytosis   . Acute blood loss anemia   . Pressure injury of skin 08/12/2016  .  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 Essential hypertension 08/07/2016  . Insulin dependent diabetes mellitus with complications (HCC) 08/07/2016  . Non-STEMI (non-ST elevated myocardial infarction) (HCC) 08/07/2016    Collier Salina, OTR/L 10/01/2016, 4:57 PM  [facility] [address] [place], [place], [address] Phone: [phone]   Fax:  [phone]  Name: [name] MRN: [id number] Date of Birth: [birth year]

## 2016-10-01 NOTE — Telephone Encounter (Signed)
[employer] form completed. Form sent to medical records. Pt needs to pay 50.00 for form. Medical records will fax.

## 2016-10-01 NOTE — Patient Instructions (Signed)
Shoulder Exercises Seated.  Holding stick in both hands evenly.   Chest press- press stick away from chest by straightening elbows and pulling it back in. 10 repetitions, 3 sets  Barrel rolls 5 forward and 5 backward- 3 sets  Circles- 5 left and 5 right- 3 sets  Upright row- bring stick from knees to chest 10 repetitions, 3 sets  With each of these exercise listen to your body - these exercises should not cause pain.  You amy feel weakness, and mild stretching.

## 2016-10-02 ENCOUNTER — Encounter: Payer: Self-pay | Admitting: Occupational Therapy

## 2016-10-02 ENCOUNTER — Ambulatory Visit: Payer: BLUE CROSS/BLUE SHIELD | Admitting: Occupational Therapy

## 2016-10-02 DIAGNOSIS — I69351 Hemiplegia and hemiparesis following cerebral infarction affecting right dominant side: Secondary | ICD-10-CM | POA: Diagnosis not present

## 2016-10-02 DIAGNOSIS — I69318 Other symptoms and signs involving cognitive functions following cerebral infarction: Secondary | ICD-10-CM

## 2016-10-02 DIAGNOSIS — M6281 Muscle weakness (generalized): Secondary | ICD-10-CM

## 2016-10-02 NOTE — Therapy (Signed)
[facility] [address] [place], [place], [address] Phone: [phone]   Fax:  [phone]  Occupational Therapy Treatment  Patient Details  Name: [name] MRN: [id number] Date of Birth: 03/19/1971 Referring Provider: Dr. Claudette LawsAndrew Kirsteins  Encounter Date: 10/02/2016      OT End of Session - 10/02/16 1636    OT Start Time 1533   OT Stop Time 1615   OT Time Calculation (min) 42 min      Past Medical History:  Diagnosis Date  . Headache    due to elevated  BP  . History of kidney stones    "passed them"  . Hypertension   . Pancreatic injury    "damage to pancrease related to gallbladder OR"  . Pneumonia X 1  . Type II diabetes mellitus (HCC)     Past Surgical History:  Procedure Laterality Date  . CARDIAC CATHETERIZATION  08/07/2016  . CARDIAC CATHETERIZATION N/A 08/07/2016   Procedure: Left Heart Cath and Coronary Angiography;  Surgeon: Lyn RecordsHenry W Smith, MD;  [facility];  Service: Cardiovascular;  Laterality: N/A;  . CARDIAC CATHETERIZATION N/A 08/07/2016   Procedure: Intravascular Pressure Wire/FFR Study;  Surgeon: Lyn RecordsHenry W Smith, MD;  [facility];  Service: Cardiovascular;  Laterality: N/A;  . CORONARY ARTERY BYPASS GRAFT N/A 08/09/2016   Procedure: CORONARY ARTERY BYPASS GRAFTING (CABG) x three,SVG to distal circ OM3, SVG to Om1, LIMA to LAD-using left internal mammary artery and right leg greater saphenous vein harvested endoscopically, Sternal plating - right side;  Surgeon: Delight OvensEdward B Gerhardt, MD;  [facility];  Service: Open Heart Surgery;  Laterality: N/A;  . FACIAL RECONSTRUCTION SURGERY  2009   "facial fractures"  . FRACTURE SURGERY    . LAPAROSCOPIC CHOLECYSTECTOMY    . TEE WITHOUT CARDIOVERSION N/A 08/09/2016   Procedure: TRANSESOPHAGEAL ECHOCARDIOGRAM (TEE);  Surgeon: Delight OvensEdward B Gerhardt, MD;  [facility];  Service: Open Heart Surgery;  Laterality: N/A;   . TUBAL LIGATION      There were no vitals filed for this visit.      Subjective Assessment - 10/02/16 1624    Subjective  I did a load of laundry and cleaned up my house   Patient is accompained by: Family member   Limitations sternal precautions    Patient Stated Goals increase endurance and return to work   Currently in Pain? 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  Worked today on simulated work activities.  Worked on divided attention - patient able to divide attention among tasks of varying difficulty in moderately distracting environment.  Patient able to complete this without difficult.       Fine Motor Coordination   Other Fine Motor Exercises In hand manipulation skills large objects (stress balls) and small objects (grooved pegs)  Patient initially needing cueing, but with repetition able to successfully manipulate within right hand.  OT Education - 10/02/16 1633    Education provided Yes   Education Details Discussed mood changes since stroke/MI, offered support and shared that neuropsych services may be an option to help patient with coping   Person(s) Educated Patient   Methods Explanation;Demonstration   Comprehension Verbalized understanding             OT Long Term Goals - 10/02/16 1544      OT LONG TERM GOAL #2   Title (P)  Improve coordination as evidenced by reducing speed on 9 hole peg test to 28 sec. or less   Time (P)  4   Period (P)  Weeks   Status (P)  Achieved  20.53 seconds!     OT LONG TERM GOAL #3   Title (P)  Improve grip strength Rt hand to 60 lbs or greater to open  jars/containers   Baseline (P)  eval = 50 lbs (Lt = 65 lbs)    Time (P)  4   Period (P)  Weeks   Status (P)  --  75, 60, 55lbs.                 Plan - 10/02/16 1634    Clinical Impression Statement Patient is improving participation in IADL in home.  Patient is expressing some problems with tearfulness in recent past.     Rehab Potential Good   OT Frequency 2x / week   OT Duration 4 weeks  additonal 4 weeks from 3/2   OT Treatment/Interventions Self-care/ADL training;DME and/or AE instruction;Moist Heat;Fluidtherapy;Patient/family education;Therapeutic exercises;Therapeutic activities;Neuromuscular education;Functional Mobility Training;Passive range of motion;Cognitive remediation/compensation;Electrical Stimulation;Energy conservation;Manual Therapy;Visual/perceptual remediation/compensation   Plan balance physical and cognitive activity as in work place, high reach with coordination task   Consulted and Agree with Plan of Care Patient;Family member/caregiver   Family Member Consulted Husband      Patient will benefit from skilled therapeutic intervention in order to improve the following deficits and impairments:  Cardiopulmonary status limiting activity, Decreased coordination, Decreased endurance, Impaired sensation, Decreased activity tolerance, Decreased knowledge of precautions, Decreased knowledge of use of DME, Impaired UE functional use, Decreased cognition, Decreased mobility, Decreased strength, Impaired perceived functional ability  Visit Diagnosis: Hemiplegia and hemiparesis following cerebral infarction affecting right dominant side (HCC)  Muscle weakness (generalized)  Other symptoms and signs involving cognitive functions following cerebral infarction    Problem List Patient Active Problem List   Diagnosis Date Noted  . Atypical chest pain   . Stroke due to embolism (HCC) 08/19/2016  . Migraine without status migrainosus, not intractable   . Obesity  (BMI 30.0-34.9)   . Tachypnea   . Hypoalbuminemia due to protein-calorie malnutrition (HCC)   . Leukocytosis   . Acute blood loss anemia   . Pressure injury of skin 08/12/2016  .  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 Essential hypertension 08/07/2016  . Insulin dependent diabetes mellitus with complications (HCC) 08/07/2016  . Non-STEMI (non-ST elevated myocardial infarction) (HCC) 08/07/2016    Collier Salina, OTR/L 10/02/2016, 4:37 PM  [facility] [address] [place], [place], [address] Phone: [phone]   Fax:  [phone]  Name: [name] MRN: [id number] Date of Birth: 1971-05-29

## 2016-10-03 ENCOUNTER — Encounter: Payer: Self-pay | Admitting: Physical Therapy

## 2016-10-03 ENCOUNTER — Ambulatory Visit: Payer: BLUE CROSS/BLUE SHIELD | Admitting: Physical Therapy

## 2016-10-03 VITALS — BP 140/86

## 2016-10-03 DIAGNOSIS — R278 Other lack of coordination: Secondary | ICD-10-CM

## 2016-10-03 DIAGNOSIS — R2689 Other abnormalities of gait and mobility: Secondary | ICD-10-CM

## 2016-10-03 DIAGNOSIS — I69351 Hemiplegia and hemiparesis following cerebral infarction affecting right dominant side: Secondary | ICD-10-CM

## 2016-10-03 DIAGNOSIS — M6281 Muscle weakness (generalized): Secondary | ICD-10-CM

## 2016-10-03 NOTE — Therapy (Signed)
[facility] [address] [place], [place], [address] Phone: [phone]   Fax:  [phone]  Physical Therapy Treatment  Patient Details  Name: [name] MRN: [id number] Date of Birth: 29-Jun-1971 Referring Provider: Erick Colace, MD  Encounter Date: 10/03/2016      PT End of Session - 10/03/16 0810    Visit Number 8   Number of Visits 17   Date for PT Re-Evaluation 10/26/16   Authorization Type BCBS   PT Start Time 0804   PT Stop Time 0845   PT Time Calculation (min) 41 min   Activity Tolerance Treatment limited secondary to medical complications (Comment)  elevated BP after activity, multiple rest breaks   Behavior During Therapy [facility] for tasks assessed/performed      Past Medical History:  Diagnosis Date  . Headache    due to elevated  BP  . History of kidney stones    "passed them"  . Hypertension   . Pancreatic injury    "damage to pancrease related to gallbladder OR"  . Pneumonia X 1  . Type II diabetes mellitus (HCC)     Past Surgical History:  Procedure Laterality Date  . CARDIAC CATHETERIZATION  08/07/2016  . CARDIAC CATHETERIZATION N/A 08/07/2016   Procedure: Left Heart Cath and Coronary Angiography;  Surgeon: Lyn Records, MD;  Location: [facility] INVASIVE CV LAB;  Service: Cardiovascular;  Laterality: N/A;  . CARDIAC CATHETERIZATION N/A 08/07/2016   Procedure: Intravascular Pressure Wire/FFR Study;  Surgeon: Lyn Records, MD;  Location: [facility] INVASIVE CV LAB;  Service: Cardiovascular;  Laterality: N/A;  . CORONARY ARTERY BYPASS GRAFT N/A 08/09/2016   Procedure: CORONARY ARTERY BYPASS GRAFTING (CABG) x three,SVG to distal circ OM3, SVG to Om1, LIMA to LAD-using left internal mammary artery and right leg greater saphenous vein harvested endoscopically, Sternal plating - right side;  Surgeon: Delight Ovens, MD;  Location: [facility] OR;  Service: Open Heart Surgery;  Laterality: N/A;  . FACIAL  RECONSTRUCTION SURGERY  2009   "facial fractures"  . FRACTURE SURGERY    . LAPAROSCOPIC CHOLECYSTECTOMY    . TEE WITHOUT CARDIOVERSION N/A 08/09/2016   Procedure: TRANSESOPHAGEAL ECHOCARDIOGRAM (TEE);  Surgeon: Delight Ovens, MD;  Location[facility] OR;  Service: Open Heart Surgery;  Laterality: N/A;  . TUBAL LIGATION      Vitals:   10/03/16 0808 10/03/16 0826  BP: (!) 142/82 140/86        Subjective Assessment - 10/03/16 0808    Subjective No new complaints. No falls to report. Has has some sternum pain this am, took tylenol with some relief.   Patient is accompained by: Family member   Pertinent History Hold exercise if BP goes above 160/100 per cardiologist recommendations.   Limitations Lifting   Patient Stated Goals "Be back to normal and be able to do more for myself" Would like to be more independent   Currently in Pain? Yes   Pain Score 2    Pain Location Sternum   Pain Descriptors / Indicators Sore   Pain Type Surgical pain   Pain Onset 1 to 4 weeks ago   Pain Frequency Intermittent   Aggravating Factors  unknown   Pain Relieving Factors tylenol             [facility] Adult PT Treatment/Exercise - 10/03/16 0813      High Level Balance   High Level Balance Activities Tandem walking;Marching backwards;Marching forwards  tandem/toe/heel walking fwd/bwd   High Level  Balance Comments red mats next to counter top: performed 3 laps each way with no UE support, min guard to min assist for balance             Balance Exercises - 10/03/16 0828      Balance Exercises: Standing   Standing Eyes Closed Narrow base of support (BOS);Wide (BOA);Head turns;Foam/compliant surface;Other reps (comment);Limitations     Balance Exercises: Standing   Standing Eyes Closed Limitations in corner on airex: progressed from wide base of support to narrow base of support: EC no head movements, progressing to EC head movements left<>right, up<>down and diagonals both ways. min guard to min  assist for balance with no UE support.    SLS with Vectors Limitations 6 cones along edges of red mats: alternating fwd toe taps to each wih side stepping left<>right x 1 lap each way, alternating double toe taps to each cone with side stepping left<>right x 1 lap each way. min guard to min assist for balance with cues on ex technique, stance position and weight shifting to assist with increased balance.             PT Short Term Goals - 09/25/16 1529      PT SHORT TERM GOAL #1   Title TARGET FOR ALL STG 09/25/2016-Pt will initiate HEP and demonstrate independence with performing    Status Achieved     PT SHORT TERM GOAL #2   Title Pt will participate in 6 minute walk test of endurance with LTG to be set.   Baseline 09/03/16: 969 feet with no AD or rest breaks taken   Status Achieved     PT SHORT TERM GOAL #3   Title Pt will participate in Functional Gait Assessment (FGA) and LTG to be set.   Baseline 09/03/16: 18/30 for baseline values   Status Achieved     PT SHORT TERM GOAL #4   Title Pt will negotiate 8 stairs, alternating sequence supervision with no UE support   Baseline 12 steps, no rails, alternating sequence supervision on 09/25/2016   Status Achieved     PT SHORT TERM GOAL #5   Title Pt will demonstrate lowered fall risk as indicated by a BERG balance score of 55/56.   Baseline 55/56 on 09/25/2016   Status Achieved           PT Long Term Goals - 08/27/16 1041      PT LONG TERM GOAL #1   Title TARGET DATE FOR ALL LTG 10/26/16: Pt will improve RLE strength to 5/5 overall   Status New     PT LONG TERM GOAL #2   Title Pt will improve FGA score by 4.2 points to demonstrate decreased falls risk during community ambulation.   Status New     PT LONG TERM GOAL #3   Title Pt will improve gait velocity to >553ft/sec for increased independence with community gait.     PT LONG TERM GOAL #4   Title Pt will improve 6 minute by 11712ft to demonstrate improvement in functional  endurance.   Status New     PT LONG TERM GOAL #5   Title Pt will negotiate >500 over level and uneven surfaces (indoor/outdoor) and will negotiate 10 stairs at mod I level    Status New               Plan - 10/03/16 0810    Clinical Impression Statement Pt's BP reamaind within safe levels duirng this session. today's skilled  session continued to focus on high level balance witout any issues reported. Pt is making steady progress toward goals and should benefit from continued PT to progress toward unmet goals.    Rehab Potential Good   Clinical Impairments Affecting Rehab Potential Cardipulmonary limitations   PT Frequency 2x / week   PT Duration 4 weeks   PT Treatment/Interventions ADLs/Self Care Home Management;Moist Heat;DME Instruction;Gait training;Stair training;Functional mobility training;Therapeutic activities;Therapeutic exercise;Balance training;Neuromuscular re-education;Patient/family education;Energy conservation;Taping   PT Next Visit Plan MONITOR VITALS at every visit;  continued to work on LE strengthening, high level balance (on compliant surfaces and single leg stance) and activity tolerance/endurance     Consulted and Agree with Plan of Care Patient      Patient will benefit from skilled therapeutic intervention in order to improve the following deficits and impairments:  Abnormal gait, Cardiopulmonary status limiting activity, Decreased endurance, Decreased balance, Decreased coordination, Decreased strength, Difficulty walking, Impaired sensation  Visit Diagnosis: Hemiplegia and hemiparesis following cerebral infarction affecting right dominant side (HCC)  Muscle weakness (generalized)  Other abnormalities of gait and mobility  Other lack of coordination     Problem List Patient Active Problem List   Diagnosis Date Noted  . Atypical chest pain   . Stroke due to embolism (HCC) 08/19/2016  . Migraine without status migrainosus, not intractable   .  Obesity (BMI 30.0-34.9)   . Tachypnea   . Hypoalbuminemia due to protein-calorie malnutrition (HCC)   . Leukocytosis   . Acute blood loss anemia   . Pressure injury of skin 08/12/2016  .  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 Essential hypertension 08/07/2016  . Insulin dependent diabetes mellitus with complications (HCC) 08/07/2016  . Non-STEMI (non-ST elevated myocardial infarction) (HCC) 08/07/2016    Sallyanne Kuster, PTA, [facility] [address], [address] [place], [place] [address] [phone] 10/03/16, 10:12 PM   Name: [name] MRN: [id number] Date of Birth: 01/03/1971

## 2016-10-04 ENCOUNTER — Encounter (HOSPITAL_COMMUNITY): Payer: Self-pay

## 2016-10-04 ENCOUNTER — Ambulatory Visit: Payer: BLUE CROSS/BLUE SHIELD | Admitting: Physical Therapy

## 2016-10-04 ENCOUNTER — Encounter: Payer: Self-pay | Admitting: Physical Therapy

## 2016-10-04 ENCOUNTER — Emergency Department (HOSPITAL_COMMUNITY): Payer: BLUE CROSS/BLUE SHIELD

## 2016-10-04 ENCOUNTER — Emergency Department (HOSPITAL_COMMUNITY)
Admission: EM | Admit: 2016-10-04 | Discharge: 2016-10-04 | Disposition: A | Discharge status: Home / Self Care | Payer: BLUE CROSS/BLUE SHIELD | Attending: Emergency Medicine | Admitting: Emergency Medicine

## 2016-10-04 DIAGNOSIS — I69351 Hemiplegia and hemiparesis following cerebral infarction affecting right dominant side: Secondary | ICD-10-CM | POA: Diagnosis not present

## 2016-10-04 DIAGNOSIS — R2689 Other abnormalities of gait and mobility: Secondary | ICD-10-CM

## 2016-10-04 DIAGNOSIS — M6281 Muscle weakness (generalized): Secondary | ICD-10-CM

## 2016-10-04 DIAGNOSIS — Z951 Presence of aortocoronary bypass graft: Secondary | ICD-10-CM | POA: Diagnosis not present

## 2016-10-04 DIAGNOSIS — R0789 Other chest pain: Secondary | ICD-10-CM | POA: Diagnosis present

## 2016-10-04 DIAGNOSIS — E119 Type 2 diabetes mellitus without complications: Secondary | ICD-10-CM | POA: Diagnosis not present

## 2016-10-04 DIAGNOSIS — Z8673 Personal history of transient ischemic attack (TIA), and cerebral infarction without residual deficits: Secondary | ICD-10-CM | POA: Insufficient documentation

## 2016-10-04 DIAGNOSIS — Z7982 Long term (current) use of aspirin: Secondary | ICD-10-CM | POA: Diagnosis not present

## 2016-10-04 DIAGNOSIS — I10 Essential (primary) hypertension: Secondary | ICD-10-CM | POA: Insufficient documentation

## 2016-10-04 DIAGNOSIS — Z794 Long term (current) use of insulin: Secondary | ICD-10-CM | POA: Diagnosis not present

## 2016-10-04 DIAGNOSIS — Z87891 Personal history of nicotine dependence: Secondary | ICD-10-CM | POA: Insufficient documentation

## 2016-10-04 HISTORY — DX: Cerebral infarction, unspecified: I63.9

## 2016-10-04 LAB — CBC
HCT: 31.6 % — ABNORMAL LOW (ref 36.0–46.0)
Hemoglobin: 10.2 g/dL — ABNORMAL LOW (ref 12.0–15.0)
MCH: 27.3 pg (ref 26.0–34.0)
MCHC: 32.3 g/dL (ref 30.0–36.0)
MCV: 84.5 fL (ref 78.0–100.0)
PLATELETS: 456 10*3/uL — AB (ref 150–400)
RBC: 3.74 MIL/uL — AB (ref 3.87–5.11)
RDW: 13.5 % (ref 11.5–15.5)
WBC: 7.5 10*3/uL (ref 4.0–10.5)

## 2016-10-04 LAB — BASIC METABOLIC PANEL
ANION GAP: 9 (ref 5–15)
BUN: 10 mg/dL (ref 6–20)
CALCIUM: 8.7 mg/dL — AB (ref 8.9–10.3)
CO2: 21 mmol/L — ABNORMAL LOW (ref 22–32)
CREATININE: 0.63 mg/dL (ref 0.44–1.00)
Chloride: 107 mmol/L (ref 101–111)
Glucose, Bld: 212 mg/dL — ABNORMAL HIGH (ref 65–99)
Potassium: 3.2 mmol/L — ABNORMAL LOW (ref 3.5–5.1)
SODIUM: 137 mmol/L (ref 135–145)

## 2016-10-04 LAB — PROTIME-INR
INR: 1.35
PROTHROMBIN TIME: 16.8 s — AB (ref 11.4–15.2)

## 2016-10-04 LAB — I-STAT TROPONIN, ED: TROPONIN I, POC: 0 ng/mL (ref 0.00–0.08)

## 2016-10-04 MED ORDER — TRAMADOL HCL 50 MG PO TABS
50.0000 mg | ORAL_TABLET | Freq: Once | ORAL | Status: AC
  Administered 2016-10-04: 50 mg via ORAL
  Filled 2016-10-04: qty 1

## 2016-10-04 MED ORDER — POTASSIUM CHLORIDE CRYS ER 20 MEQ PO TBCR
40.0000 meq | EXTENDED_RELEASE_TABLET | Freq: Once | ORAL | Status: AC
  Administered 2016-10-04: 40 meq via ORAL
  Filled 2016-10-04: qty 2

## 2016-10-04 MED ORDER — TRAMADOL HCL 50 MG PO TABS: 50.0000 mg | ORAL_TABLET | Freq: Two times a day (BID) | ORAL | 0 refills | Status: AC | PRN

## 2016-10-04 NOTE — Therapy (Signed)
[facility] [address] [place], [place], [address] Phone: [phone]   Fax:  [phone]  Physical Therapy Treatment  Patient Details  Name: Dim Meisinger MRN: [id number] Date of Birth: [birth year] Referring Provider: Charlett Blake, MD  Encounter Date: 10/04/2016      PT End of Session - 10/04/16 0901    Visit Number 9   Number of Visits 17  D/C early, D/C today   Date for PT Re-Evaluation 10/26/16   Authorization Type BC[employer]   PT Start Time 0805   PT Stop Time 0845   PT Time Calculation (min) 40 min   Activity Tolerance Patient tolerated treatment well   Behavior During Therapy [facility] for tasks assessed/performed      Past Medical History:  Diagnosis Date  . Headache    due to elevated  BP  . History of kidney stones    "passed them"  . Hypertension   . Pancreatic injury    "damage to pancrease related to gallbladder OR"  . Pneumonia X 1  . Type II diabetes mellitus ([place])     Past Surgical History:  Procedure Laterality Date  . CARDIAC CATHETERIZATION  08/07/2016  . CARDIAC CATHETERIZATION N/A 08/07/2016   Procedure: Left Heart Cath and Coronary Angiography;  Surgeon: Belva Crome, MD;  Location: [place] [facility];  Service: Cardiovascular;  Laterality: N/A;  . CARDIAC CATHETERIZATION N/A 08/07/2016   Procedure: Intravascular Pressure Wire/FFR Study;  Surgeon: Belva Crome, MD;  Location: [place] [facility];  Service: Cardiovascular;  Laterality: N/A;  . CORONARY ARTERY BYPASS GRAFT N/A 08/09/2016   Procedure: CORONARY ARTERY BYPASS GRAFTING (CABG) x three,SVG to distal circ OM3, SVG to Om1, LIMA to LAD-using left internal mammary artery and right leg greater saphenous vein harvested endoscopically, Sternal plating - right side;  Surgeon: Grace Isaac, MD;  Location: [facility];  Service: Open Heart Surgery;  Laterality: N/A;  . FACIAL RECONSTRUCTION SURGERY  2009   "facial fractures"  .  FRACTURE SURGERY    . LAPAROSCOPIC CHOLECYSTECTOMY    . TEE WITHOUT CARDIOVERSION N/A 08/09/2016   Procedure: TRANSESOPHAGEAL ECHOCARDIOGRAM (TEE);  Surgeon: Grace Isaac, MD;  Location: [place];  Service: Open Heart Surgery;  Laterality: N/A;  . TUBAL LIGATION      There were no vitals filed for this visit.      Subjective Assessment - 10/04/16 0805    Subjective Doing well, drove herself to therapy again today. Pt is anxious to get started with cardiac rehab.  Starts back to work 1/2 day on Monday.   Pertinent History Hold exercise if BP goes above 160/100 per cardiologist recommendations.   Limitations Lifting   Patient Stated Goals "Be back to normal and be able to do more for myself" Would like to be more independent   Currently in Pain? Yes  soreness in sternum, sensation is returning           [facility] PT Assessment - 10/04/16 0814      Sensation   Light Touch Appears Intact  some numbness around incisions     ROM / Strength   AROM / PROM / Strength Strength     Strength   Overall Strength Within functional limits for tasks performed   Overall Strength Comments 5/5 RLE now     Ambulation/Gait   Gait velocity .8 seconds or 4.1 ft/sec     6 Minute Walk- Baseline   6 Minute Walk- Baseline yes  BP (mmHg) 170/90   HR (bpm) 61   02 Sat (%RA) 99 %   Modified Borg Scale for Dyspnea 0- Nothing at all   Perceived Rate of Exertion (Borg) 6-     6 Minute walk- Post Test   6 Minute Walk Post Test yes   BP (mmHg) 170/90   HR (bpm) 62   02 Sat (%RA) 99 %   Modified Borg Scale for Dyspnea 0- Nothing at all   Perceived Rate of Exertion (Borg) 7- Very, very light     6 minute walk test results    Aerobic Endurance Distance Walked 1174     Functional Gait  Assessment   Gait assessed  Yes   Gait Level Surface Walks 20 ft in less than 5.5 sec, no assistive devices, good speed, no evidence for imbalance, normal gait pattern, deviates no more than 6 in outside of the 12 in  walkway width.   Change in Gait Speed Able to smoothly change walking speed without loss of balance or gait deviation. Deviate no more than 6 in outside of the 12 in walkway width.   Gait with Horizontal Head Turns Performs head turns smoothly with no change in gait. Deviates no more than 6 in outside 12 in walkway width   Gait with Vertical Head Turns Performs head turns with no change in gait. Deviates no more than 6 in outside 12 in walkway width.   Gait and Pivot Turn Pivot turns safely within 3 sec and stops quickly with no loss of balance.   Step Over Obstacle Is able to step over one shoe box (4.5 in total height) without changing gait speed. No evidence of imbalance.   Gait with Narrow Base of Support Ambulates 7-9 steps.   Gait with Eyes Closed Walks 20 ft, no assistive devices, good speed, no evidence of imbalance, normal gait pattern, deviates no more than 6 in outside 12 in walkway width. Ambulates 20 ft in less than 7 sec.   Ambulating Backwards Walks 20 ft, no assistive devices, good speed, no evidence for imbalance, normal gait   Steps Alternating feet, no rail.   Total Score 28   FGA comment: 28/30 low risk for falls            PT Education - 10/04/16 0900    Education provided Yes   Education Details Goals met, D/C from therapy today and will initiate cardiac rehabilitation   Person(s) Educated Patient   Methods Explanation   Comprehension Verbalized understanding          PT Short Term Goals - 09/25/16 1529      PT SHORT TERM GOAL #1   Title TARGET FOR ALL STG 09/25/2016-Pt will initiate HEP and demonstrate independence with performing    Status Achieved     PT SHORT TERM GOAL #2   Title Pt will participate in 6 minute walk test of endurance with LTG to be set.   Baseline 09/03/16: 969 feet with no AD or rest breaks taken   Status Achieved     PT SHORT TERM GOAL #3   Title Pt will participate in Functional Gait Assessment (FGA) and LTG to be set.   Baseline  09/03/16: 18/30 for baseline values   Status Achieved     PT SHORT TERM GOAL #4   Title Pt will negotiate 8 stairs, alternating sequence supervision with no UE support   Baseline 12 steps, no rails, alternating sequence supervision on 09/25/2016   Status Achieved  PT SHORT TERM GOAL #5   Title Pt will demonstrate lowered fall risk as indicated by a BERG balance score of 55/56.   Baseline 55/56 on 09/25/2016   Status Achieved           PT Long Term Goals - 10/04/16 0842      PT LONG TERM GOAL #1   Title TARGET DATE FOR ALL LTG 10/26/16: Pt will improve RLE strength to 5/5 overall   Baseline 5/5   Status Achieved     PT LONG TERM GOAL #2   Title Pt will improve FGA score by 4.2 points to demonstrate decreased falls risk during community ambulation.   Baseline 28/30   Status Achieved     PT LONG TERM GOAL #3   Title Pt will improve gait velocity to >78f/sec for increased independence with community gait.   Baseline 4.1 ft/sec   Status Achieved     PT LONG TERM GOAL #4   Title Pt will improve 6 minute by 1112fto demonstrate improvement in functional endurance.   Baseline 1174 ft   Status Achieved     PT LONG TERM GOAL #5   Title Pt will negotiate >500 over level and uneven surfaces (indoor/outdoor) and will negotiate 10 stairs at mod I level    Status Achieved               Plan - 10/04/16 0903    Clinical Impression Statement Pt has made excellent progress and has achieved all LTG.  She is driving and performing household duties independently.  Pt to return to work part time on Monday and is ready to begin cardiac rehabilitation to focus on aerobic conditioning and endurance.  Pt to D/C from therapy today.  Pt pleased with progress and agreeable to D/C.   PT Treatment/Interventions ADLs/Self Care Home Management;Moist Heat;DME Instruction;Gait training;Stair training;Functional mobility training;Therapeutic activities;Therapeutic exercise;Balance  training;Neuromuscular re-education;Patient/family education;Energy conservation;Taping   PT Next Visit Plan D/C from therapy today   Consulted and Agree with Plan of Care Patient      Patient will benefit from skilled therapeutic intervention in order to improve the following deficits and impairments:  Abnormal gait, Cardiopulmonary status limiting activity, Decreased endurance, Decreased balance, Decreased coordination, Decreased strength, Difficulty walking, Impaired sensation  Visit Diagnosis: Hemiplegia and hemiparesis following cerebral infarction affecting right dominant side (HCC)  Muscle weakness (generalized)  Other abnormalities of gait and mobility     Problem List Patient Active Problem List   Diagnosis Date Noted  . Atypical chest pain   . Stroke due to embolism (HCR[place]01/22/2018  . Migraine without status migrainosus, not intractable   . Obesity (BMI 30.0-34.9)   . Tachypnea   . Hypoalbuminemia due to protein-calorie malnutrition ([place]  . Leukocytosis   . Acute blood loss anemia   . Pressure injury of skin 08/12/2016  .  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 Essential hypertension 08/07/2016  . Insulin dependent diabetes mellitus with complications (HCBayou [id number]. Non-STEMI (non-ST elevated myocardial infarction) (HCR[place]01/04/2017    PHYSICAL THERAPY DISCHARGE SUMMARY  Visits from Start of Care: 9  Current functional level related to goals / functional outcomes: See above   Remaining deficits: Impaired Cardiopulmonary endurance   Education / Equipment: Cardiac Rehabilitation, HEP, energy conservation  Plan: Patient agrees to discharge.  Patient goals were met. Patient is being discharged due to meeting the stated rehab goals.  ?????    AuRaylene EvertsPT, DPT 10/04/16  9:10 AM    [facility] [address] [place], [place], [address] Phone: [phone]   Fax:  [phone]  Name:  Jamileth Putzier MRN: [id number] Date of Birth: February 01, 1971

## 2016-10-04 NOTE — ED Triage Notes (Signed)
Pt reports shooting chest pain that started this morning. She had open heart surgery Jan 12th and right after the surgery she had a stroke and has residual right sided weakness but reports she has been doing really well with rehab.

## 2016-10-04 NOTE — Discharge Instructions (Signed)
Your chest pain is likely due to resolution of nerve injury from prior open heart surgery.  Take tramadol as needed for pain and follow up with your provider for further care.  Return to the ER if your condition worsen or if you have other concerns.

## 2016-10-04 NOTE — ED Provider Notes (Signed)
[facility] DEPT Provider Note   CSN: [id number] Arrival date & time: 10/04/16  1833     History   Chief Complaint Chief Complaint  Patient presents with  . Chest Pain    HPI [name] is a 46 y.o. female.  HPI   46 year old obese female with history of insulin-dependent type 2 diabetes, hypertension, presenting today with complaint of chest pain. Pt has CAD, triple vessel disease with unstable angina undergoing CABG on Jan 12th by Dr. Servando Snare.  She reported having open heart surgery follows with combination including stroke involve her right upper extremity. She is making good recovery from her stroke, undergoing physical therapy and occupational therapy and is set up to have cardiac rehabilitation soon. She also report having loss of sensation to her anterior chest since the surgery but since yesterday she begins to start having sensation back and chest. She described sensation as a achy bruising pain across her chest, persistent and progressively worsened. Currently rated as 10 out of 10 not improve despite taking 2 Tylenol several hours ago. She had to the pain to regaining sensation of her chest wall. She denies any associated exertional chest pain, lightheadedness, dizziness, radiating pain, increased shortness of breath, productive cough, hemoptysis, or rash.  Past Medical History:  Diagnosis Date  . Headache    due to elevated  BP  . History of kidney stones    "passed them"  . Hypertension   . Pancreatic injury    "damage to pancrease related to gallbladder OR"  . Pneumonia X 1  . Stroke ([place])   . Type II diabetes mellitus [facility])     Patient Active Problem List   Diagnosis Date Noted  . Atypical chest pain   . Stroke due to embolism ([place]) 08/19/2016  . Migraine without status migrainosus, not intractable   . Obesity (BMI 30.0-34.9)   . Tachypnea   . Hypoalbuminemia due to protein-calorie malnutrition ([place])   . Leukocytosis   . Acute blood loss  anemia   . Pressure injury of skin 08/12/2016  .  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 Essential hypertension 08/07/2016  . Insulin dependent diabetes mellitus with complications ([place]) 48/54/6270  . Non-STEMI (non-ST elevated myocardial infarction) ([place]) 08/07/2016    Past Surgical History:  Procedure Laterality Date  . CARDIAC CATHETERIZATION  08/07/2016  . CARDIAC CATHETERIZATION N/A 08/07/2016   Procedure: Left Heart Cath and Coronary Angiography;  Surgeon: Belva Crome, MD;  Location: [place] [facility];  Service: Cardiovascular;  Laterality: N/A;  . CARDIAC CATHETERIZATION N/A 08/07/2016   Procedure: Intravascular Pressure Wire/FFR Study;  Surgeon: Belva Crome, MD;  Location: [place] [facility];  Service: Cardiovascular;  Laterality: N/A;  . CORONARY ARTERY BYPASS GRAFT N/A 08/09/2016   Procedure: CORONARY ARTERY BYPASS GRAFTING (CABG) x three,SVG to distal circ OM3, SVG to Om1, LIMA to LAD-using left internal mammary artery and right leg greater saphenous vein harvested endoscopically, Sternal plating - right side;  Surgeon: Grace Isaac, MD;  Location: [facility];  Service: Open Heart Surgery;  Laterality: N/A;  . FACIAL RECONSTRUCTION SURGERY  2009   "facial fractures"  . FRACTURE SURGERY    . LAPAROSCOPIC CHOLECYSTECTOMY    . TEE WITHOUT CARDIOVERSION N/A 08/09/2016   Procedure: TRANSESOPHAGEAL ECHOCARDIOGRAM (TEE);  Surgeon: Grace Isaac, MD;  Location: [facility];  Service: Open Heart Surgery;  Laterality: N/A;  . TUBAL LIGATION      OB History    No data available  Home Medications    Prior to Admission medications   Medication Sig Start Date End Date Taking? Authorizing Provider  aspirin EC 325 MG EC tablet Take 1 tablet (325 mg total) by mouth daily. 08/20/16   Elgie Collard, PA-C  atorvastatin (LIPITOR) 80 MG tablet Take 1 tablet (80 mg total) by mouth daily at 6 PM. 08/23/16   Ivan Anchors Love, PA-C  B-D UF III MINI PEN NEEDLES 31G X 5 MM MISC  08/23/16   Historical  Provider, MD  blood glucose meter kit and supplies KIT Dispense based on patient and insurance preference. Use up to four times daily as directed. (FOR ICD-9 250.00, 250.01). 08/23/16   Ivan Anchors Love, PA-C  cloNIDine (CATAPRES) 0.2 MG tablet Take 0.2 mg by mouth daily.    Historical Provider, MD  clopidogrel (PLAVIX) 75 MG tablet Take 1 tablet (75 mg total) by mouth daily. 08/23/16   Bary Leriche, PA-C  furosemide (LASIX) 40 MG tablet Take 1 tablet (40 mg total) by mouth daily. 08/23/16 08/30/16  Bary Leriche, PA-C  Insulin Detemir (LEVEMIR) 100 UNIT/ML Pen Inject 30 Units into the skin daily after breakfast. Patient taking differently: Inject 30 Units into the skin daily at 10 pm.  08/23/16   Ivan Anchors Love, PA-C  Insulin Pen Needle (CLICKFINE PEN NEEDLES) 31G X 8 MM MISC 1 application by Does not apply route as directed. 08/23/16   Ivan Anchors Love, PA-C  lisinopril (PRINIVIL,ZESTRIL) 20 MG tablet Take 1 tablet (20 mg total) by mouth daily. 09/12/16 12/11/16  Minus Breeding, MD  metoprolol tartrate (LOPRESSOR) 25 MG tablet Take 1 tablet (25 mg total) by mouth 2 (two) times daily. 08/23/16   Bary Leriche, PA-C  nitroGLYCERIN (NITROSTAT) 0.4 MG SL tablet Place 1 tablet (0.4 mg total) under the tongue every 5 (five) minutes as needed for chest pain. Max 3 doses--call MD if you need to use 2 or more pills 08/23/16   Ivan Anchors Love, PA-C  NOVOLOG 100 UNIT/ML injection Takes as a Sliding Scale prn 08/19/16   Historical Provider, MD  Glory Rosebush VERIO test strip  08/23/16   Historical Provider, MD  pantoprazole (PROTONIX) 40 MG tablet Take 1 tablet (40 mg total) by mouth daily before breakfast. 08/24/16   Ivan Anchors Love, PA-C  senna-docusate (SENOKOT-S) 8.6-50 MG tablet Take 1 tablet by mouth at bedtime. 08/23/16   Bary Leriche, PA-C  traMADol (ULTRAM) 50 MG tablet Take 1 tablet (50 mg total) by mouth every 12 (twelve) hours as needed for moderate pain. 08/23/16   Bary Leriche, PA-C    Family History Family History    Problem Relation Age of Onset  . Hypertension Mother   . Diabetes Mother   . Hypertension Father   . Diabetes Father   . Heart attack Father 32    had CABG    Social History Social History  Substance Use Topics  . Smoking status: Former Smoker    Packs/day: 0.50    Years: 20.00    Types: Cigarettes  . Smokeless tobacco: Never Used  . Alcohol use No     Allergies   Eugenol; Hydrochlorothiazide; Morphine and related; Percocet [oxycodone-acetaminophen]; Shellfish allergy; Sulfa antibiotics; and Tomato   Review of Systems Review of Systems  All other systems reviewed and are negative.    Physical Exam Updated Vital Signs BP (!) 215/87 (BP Location: Left Arm)   Pulse (!) 56   Temp 98.8 F (37.1 C) (Oral)   Resp  18   Ht 4' 11.5" (1.511 m)   Wt 78 kg   LMP 09/24/2016   SpO2 100%   BMI 34.16 kg/m   Physical Exam  Constitutional: She appears well-developed and well-nourished. No distress.  Patient is in no acute distress, resting in bed.  HENT:  Head: Atraumatic.  Eyes: Conjunctivae are normal.  Neck: Neck supple.  Cardiovascular: Normal rate, regular rhythm and intact distal pulses.   Pulmonary/Chest: Effort normal and breath sounds normal. She exhibits tenderness (Tenderness to anterior chest with gentle palpation. Well-healing midline sternal scar without any signs of infection. No crepitus or emphysema.).  Abdominal: Soft. She exhibits no distension. There is no tenderness.  Neurological: She is alert.  Skin: No rash noted.  Psychiatric: She has a normal mood and affect.  Nursing note and vitals reviewed.    ED Treatments / Results  Labs (all labs ordered are listed, but only abnormal results are displayed) Labs Reviewed  BASIC METABOLIC PANEL - Abnormal; Notable for the following:       Result Value   Potassium 3.2 (*)    CO2 21 (*)    Glucose, Bld 212 (*)    Calcium 8.7 (*)    All other components within normal limits  CBC - Abnormal; Notable  for the following:    RBC 3.74 (*)    Hemoglobin 10.2 (*)    HCT 31.6 (*)    Platelets 456 (*)    All other components within normal limits  PROTIME-INR - Abnormal; Notable for the following:    Prothrombin Time 16.8 (*)    All other components within normal limits  I-STAT TROPOININ, ED    EKG  EKG Interpretation  Date/Time:  Friday October 04 2016 18:38:57 EST Ventricular Rate:  62 PR Interval:  128 QRS Duration: 82 QT Interval:  390 QTC Calculation: 395 R Axis:   54 Text Interpretation:  Normal sinus rhythm Left ventricular hypertrophy with repolarization abnormality No significant change since last tracing Confirmed by Maryan Rued  MD, Loree Fee ([id number]) on 10/04/2016 9:02:28 PM       Radiology Dg Chest 2 View  Result Date: 10/04/2016 CLINICAL DATA:  46 y/o  F; chest pain. EXAM: CHEST  2 VIEW COMPARISON:  09/26/2016 chest radiograph FINDINGS: Stable normal cardiac silhouette. Median sternotomy wires are aligned and intact. Mild further interval diminution of left-sided pleural effusion with trace bilateral residuals. Stable pulmonary venous hypertension. No new focal consolidation. Right upper quadrant cholecystectomy clips. Mild multilevel degenerative changes of the thoracic spine. IMPRESSION: Further interval diminution in left-sided pleural effusion with trace bilateral residuals. No new focal consolidation. Stable pulmonary venous hypertension. Electronically Signed   By: Kristine Garbe M.D.   On: 10/04/2016 19:14    Procedures Procedures (including critical care time)  Medications Ordered in ED Medications  potassium chloride SA (K-DUR,KLOR-CON) CR tablet 40 mEq (40 mEq Oral Given 10/04/16 2225)  traMADol (ULTRAM) tablet 50 mg (50 mg Oral Given 10/04/16 2225)     Initial Impression / Assessment and Plan / ED Course  I have reviewed the triage vital signs and the nursing notes.  Pertinent labs & imaging results that were available during my care of the patient were  reviewed by me and considered in my medical decision making (see chart for details).     BP 162/73   Pulse 65   Temp 98.8 F (37.1 C) (Oral)   Resp 25   Ht 4' 11.5" (1.511 m)   Wt 78 kg   LMP  09/24/2016   SpO2 100%   BMI 34.16 kg/m    Final Clinical Impressions(s) / ED Diagnoses   Final diagnoses:  Chest wall pain    New Prescriptions New Prescriptions   No medications on file   11:14 PM Pt had open heart surgery and report having loss of sensation to her anterior chest wall since the surgery in January.  She is now regaining sensation since yesterday but report having pain and discomfort to her anterior chest.  Her pain is likely due to resolution of her neuropraxia.  Pain not consistent with ACS.  Work up unremarkable. Mild hypokalemia, K+ supplementation given.  Pt stable for discharge.  Tramadol as needed for pain.  Pt to f/u with her doctor for further care.  Care discussed with Dr. Maryan Rued.    Domenic Moras, PA-C 10/04/16 4320    Blanchie Dessert, MD 10/04/16 2325

## 2016-10-04 NOTE — ED Notes (Signed)
Pt stable, understands discharge instructions, and reasons for return.   

## 2016-10-09 ENCOUNTER — Ambulatory Visit: Payer: BLUE CROSS/BLUE SHIELD | Admitting: Occupational Therapy

## 2016-10-09 VITALS — BP 140/75 | HR 74

## 2016-10-09 DIAGNOSIS — R2689 Other abnormalities of gait and mobility: Secondary | ICD-10-CM

## 2016-10-09 DIAGNOSIS — M6281 Muscle weakness (generalized): Secondary | ICD-10-CM

## 2016-10-09 DIAGNOSIS — I69351 Hemiplegia and hemiparesis following cerebral infarction affecting right dominant side: Secondary | ICD-10-CM | POA: Diagnosis not present

## 2016-10-09 DIAGNOSIS — R278 Other lack of coordination: Secondary | ICD-10-CM

## 2016-10-10 NOTE — Therapy (Signed)
[facility] [address] [place], [place], [address] Phone: [phone]   Fax:  [phone]  Occupational Therapy Treatment  Patient Details  Name: [name] MRN: [id number] Date of Birth: 11/03/1970 Referring Provider: Dr. Claudette Laws  Encounter Date: 10/09/2016      OT End of Session - 10/09/16 1552    Visit Number 8   Number of Visits 9   Authorization Type BC/BS   OT Start Time 1535   OT Stop Time 1600   OT Time Calculation (min) 25 min   Activity Tolerance Patient tolerated treatment well   Behavior During Therapy [facility] for tasks assessed/performed      Past Medical History:  Diagnosis Date  . Headache    due to elevated  BP  . History of kidney stones    "passed them"  . Hypertension   . Pancreatic injury    "damage to pancrease related to gallbladder OR"  . Pneumonia X 1  . Stroke (HCC)   . Type II diabetes mellitus (HCC)     Past Surgical History:  Procedure Laterality Date  . CARDIAC CATHETERIZATION  08/07/2016  . CARDIAC CATHETERIZATION N/A 08/07/2016   Procedure: Left Heart Cath and Coronary Angiography;  Surgeon: Lyn Records, MD;  Location: [facility] INVASIVE CV LAB;  Service: Cardiovascular;  Laterality: N/A;  . CARDIAC CATHETERIZATION N/A 08/07/2016   Procedure: Intravascular Pressure Wire/FFR Study;  Surgeon: Lyn Records, MD;  Location: [facility] INVASIVE CV LAB;  Service: Cardiovascular;  Laterality: N/A;  . CORONARY ARTERY BYPASS GRAFT N/A 08/09/2016   Procedure: CORONARY ARTERY BYPASS GRAFTING (CABG) x three,SVG to distal circ OM3, SVG to Om1, LIMA to LAD-using left internal mammary artery and right leg greater saphenous vein harvested endoscopically, Sternal plating - right side;  Surgeon: Delight Ovens, MD;  Location: [facility] OR;  Service: Open Heart Surgery;  Laterality: N/A;  . FACIAL RECONSTRUCTION SURGERY  2009   "facial fractures"  . FRACTURE SURGERY    . LAPAROSCOPIC  CHOLECYSTECTOMY    . TEE WITHOUT CARDIOVERSION N/A 08/09/2016   Procedure: TRANSESOPHAGEAL ECHOCARDIOGRAM (TEE);  Surgeon: Delight Ovens, MD;  Location: [facility] OR;  Service: Open Heart Surgery;  Laterality: N/A;  . TUBAL LIGATION      Vitals:   10/09/16 1551  BP: 140/75  Pulse: 74  SpO2: 97%        Subjective Assessment - 10/10/16 1358    Subjective  Pt reports she has returned to work part time   Patient is accompained by: Family member   Limitations sternal precautions    Patient Stated Goals increase endurance and return to work   Currently in Pain? No/denies             Treatment:checked progress towards goals and discussed with pt/ husband. Reviewed UE HEP, pt returned demonstration. Pt agrees with plans for d/c.                        OT Long Term Goals - 10/09/16 1556      OT LONG TERM GOAL #1   Title Independent with HEP for coordination and strength RUE   Status Achieved     OT LONG TERM GOAL #2   Title Improve coordination as evidenced by reducing speed on 9 hole peg test to 28 sec. or less   Time 4   Period Weeks   Status Achieved  20.53 seconds!     OT LONG TERM GOAL #  3   Title Improve grip strength Rt hand to 60 lbs or greater to open jars/containers   Baseline eval = 50 lbs (Lt = 65 lbs)    Time 4   Period Weeks   Status Achieved  75,, 70, 70     OT LONG TERM GOAL #4   Title Pt to verbalize understanding with memory compensatory strategies   Status Achieved     OT LONG TERM GOAL #5   Title Pt to return to light IADLS including meal prep and light cleaning mod I level for 30 minute without rest   Status Achieved     OT LONG TERM GOAL #6   Title Pt to perform complex organizational and problem solving tasks with extra time and external aids prn, but no cueing at 90% or greater accuracy in prep for work related tasks   Time 4   Period Weeks   Status Achieved               Plan - 10/10/16 1354    Clinical  Impression Statement Pt reports she has retruned to work and she is performing home management and cooking activities at home.   Rehab Potential Good   OT Frequency 2x / week   OT Duration 4 weeks   OT Treatment/Interventions Self-care/ADL training;DME and/or AE instruction;Moist Heat;Fluidtherapy;Patient/family education;Therapeutic exercises;Therapeutic activities;Neuromuscular education;Functional Mobility Training;Passive range of motion;Cognitive remediation/compensation;Electrical Stimulation;Energy conservation;Manual Therapy;Visual/perceptual remediation/compensation   Plan pt reports she feels ready for d/c   Consulted and Agree with Plan of Care Patient;Family member/caregiver   Family Member Consulted Husband      Patient will benefit from skilled therapeutic intervention in order to improve the following deficits and impairments:  Cardiopulmonary status limiting activity, Decreased coordination, Decreased endurance, Impaired sensation, Decreased activity tolerance, Decreased knowledge of precautions, Decreased knowledge of use of DME, Impaired UE functional use, Decreased cognition, Decreased mobility, Decreased strength, Impaired perceived functional ability  Visit Diagnosis: Hemiplegia and hemiparesis following cerebral infarction affecting right dominant side (HCC)  Muscle weakness (generalized)  Other abnormalities of gait and mobility  Other lack of coordination    Problem List Patient Active Problem List   Diagnosis Date Noted  . Atypical chest pain   . Stroke due to embolism (HCC) 08/19/2016  . Migraine without status migrainosus, not intractable   . Obesity (BMI 30.0-34.9)   . Tachypnea   . Hypoalbuminemia due to protein-calorie malnutrition (HCC)   . Leukocytosis   . Acute blood loss anemia   . Pressure injury of skin 08/12/2016  .  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 Essential hypertension 08/07/2016  . Insulin dependent diabetes mellitus with complications  (HCC) 08/07/2016  . Non-STEMI (non-ST elevated myocardial infarction) (HCC) 08/07/2016    RINE,KATHRYN 10/10/2016, 1:59 PM  [place] [facility] [address] [place], [place], [address] Phone: [phone]   Fax:  [phone]  Name: [name] MRN: [id number] Date of Birth: 07/31/1970

## 2016-10-11 ENCOUNTER — Encounter: Payer: BLUE CROSS/BLUE SHIELD | Admitting: Occupational Therapy

## 2016-10-11 ENCOUNTER — Ambulatory Visit: Payer: BLUE CROSS/BLUE SHIELD | Admitting: Physical Therapy

## 2016-10-14 ENCOUNTER — Ambulatory Visit: Payer: BLUE CROSS/BLUE SHIELD | Admitting: Physical Therapy

## 2016-10-14 ENCOUNTER — Encounter: Payer: BLUE CROSS/BLUE SHIELD | Admitting: Occupational Therapy

## 2016-10-16 ENCOUNTER — Ambulatory Visit: Payer: BLUE CROSS/BLUE SHIELD | Admitting: Physical Therapy

## 2016-10-16 ENCOUNTER — Encounter: Payer: BLUE CROSS/BLUE SHIELD | Admitting: Occupational Therapy

## 2016-10-22 ENCOUNTER — Encounter: Payer: BLUE CROSS/BLUE SHIELD | Admitting: Occupational Therapy

## 2016-10-24 ENCOUNTER — Encounter: Payer: Self-pay | Admitting: Nurse Practitioner

## 2016-10-24 ENCOUNTER — Ambulatory Visit (INDEPENDENT_AMBULATORY_CARE_PROVIDER_SITE_OTHER): Payer: BLUE CROSS/BLUE SHIELD | Admitting: Nurse Practitioner

## 2016-10-24 ENCOUNTER — Encounter: Payer: BLUE CROSS/BLUE SHIELD | Admitting: Occupational Therapy

## 2016-10-24 VITALS — BP 160/80 | HR 54 | Ht 59.5 in | Wt 175.0 lb

## 2016-10-24 DIAGNOSIS — I10 Essential (primary) hypertension: Secondary | ICD-10-CM | POA: Diagnosis not present

## 2016-10-24 DIAGNOSIS — Z794 Long term (current) use of insulin: Secondary | ICD-10-CM | POA: Diagnosis not present

## 2016-10-24 DIAGNOSIS — E118 Type 2 diabetes mellitus with unspecified complications: Secondary | ICD-10-CM

## 2016-10-24 DIAGNOSIS — I63113 Cerebral infarction due to embolism of bilateral vertebral arteries: Secondary | ICD-10-CM | POA: Diagnosis not present

## 2016-10-24 DIAGNOSIS — IMO0001 Reserved for inherently not codable concepts without codable children: Secondary | ICD-10-CM

## 2016-10-24 NOTE — Patient Instructions (Addendum)
Stressed the importance of management of risk factors to prevent further stroke Continue Plavix for secondary stroke prevention Maintain strict control of hypertension with blood pressure goal below 130/90, today's reading 160/80 continue antihypertensive medications Control of diabetes with hemoglobin A1c below 6.5 followed by primary care most recent hemoglobin A1c 13.1 continue diabetic medications Cholesterol with LDL cholesterol less than 70, followed by primary care,  most recent 67 continue statin drugs Will repeat CTA of the head and neck to follow-up on BA thrombosis Exercise by walking, may begin cardiac rehabilitation slowly increase , eat healthy diet with whole grains,  fresh fruits and vegetables Discussed risk for recurrent stroke/ TIA and answered additional questions Follow up in 4 months

## 2016-10-24 NOTE — Progress Notes (Signed)
[facility]  PATIENT: [name] DOB: 30-Nov-1970   REASON FOR VISIT: Follow-up for left brain infarct with risk factors of uncontrolled diabetes smoking hyperlipidemia hypertension HISTORY FROM: Patient    HISTORY OF PRESENT ILLNESS: [name], 46 year old female returns for follow-up after admission to the hospital for chest pain status post CABG then developed right-sided weakness and neglect she did not receive the TPA due to unclear postop status. CT of the head no acute  abnormality. CTA head and neck proximal BA  thrombosis left VA stenosis. TEE done intra operatively , no PFO. Lower extremity Doppler negative for DVT. LDL 67 hemoglobin A1c 13.1. She returns to the stroke clinic today for follow-up she has not had further stroke TIA symptoms. She is currently on Plavix and aspirin secondary stroke prevention with no bruising and no bleeding. Blood pressure in the office today 160/80. She claims her diabetes is in  better control and she has had adjustments to her insulin. She is on Lipitor without complaints of myalgias she is to start cardiac rehabilitation next week. She is back to work full-time. She feels that she has mostly back to baseline she returns for reevaluation.   REVIEW OF SYSTEMS: Full 14 system review of systems performed and notable only for those listed, all others are neg:  Constitutional: Fatigue  Cardiovascular: neg Ear/Nose/Throat: neg  Skin: neg Eyes: neg Respiratory: neg Gastroitestinal: neg  Hematology/Lymphatic: neg  Endocrine: neg Musculoskeletal:neg Allergy/Immunology: Allergies Neurological: neg Psychiatric: Anxiety Sleep : neg   ALLERGIES: Allergies  Allergen Reactions  . Eugenol Hives and Other (See Comments)    Burns mouth  . Hydrochlorothiazide Anaphylaxis  . Morphine And Related Anaphylaxis  . Percocet [Oxycodone-Acetaminophen] Anaphylaxis and Nausea And Vomiting  . Shellfish Allergy Anaphylaxis    . Sulfa Antibiotics Anaphylaxis  . Tomato Anaphylaxis    HOME MEDICATIONS: Outpatient Medications Prior to Visit  Medication Sig Dispense Refill  . atorvastatin (LIPITOR) 80 MG tablet Take 1 tablet (80 mg total) by mouth daily at 6 PM. 30 tablet 0  . B-D UF III MINI PEN NEEDLES 31G X 5 MM MISC     . blood glucose meter kit and supplies KIT Dispense based on patient and insurance preference. Use up to four times daily as directed. (FOR ICD-9 250.00, 250.01). 1 each 0  . cloNIDine (CATAPRES) 0.2 MG tablet Take 0.2 mg by mouth daily.    . clopidogrel (PLAVIX) 75 MG tablet Take 1 tablet (75 mg total) by mouth daily. 30 tablet 0  . Insulin Detemir (LEVEMIR) 100 UNIT/ML Pen Inject 30 Units into the skin daily after breakfast. (Patient taking differently: Inject 30 Units into the skin daily at 10 pm. ) 15 mL 0  . Insulin Pen Needle (CLICKFINE PEN NEEDLES) 31G X 8 MM MISC 1 application by Does not apply route as directed. 100 each 0  . lisinopril (PRINIVIL,ZESTRIL) 20 MG tablet Take 1 tablet (20 mg total) by mouth daily. 90 tablet 3  . metoprolol tartrate (LOPRESSOR) 25 MG tablet Take 1 tablet (25 mg total) by mouth 2 (two) times daily. 60 tablet 0  . NOVOLOG 100 UNIT/ML injection Takes as a Sliding Scale prn patient states she is not sure how much insulin she takes    . ONETOUCH VERIO test strip     . pantoprazole (PROTONIX) 40 MG tablet Take 1 tablet (40 mg total) by mouth daily before breakfast. 30 tablet 0  . traMADol (ULTRAM) 50 MG tablet Take 1 tablet (50 mg total)  by mouth every 12 (twelve) hours as needed for moderate pain. 15 tablet 0  . furosemide (LASIX) 40 MG tablet Take 1 tablet (40 mg total) by mouth daily. 30 tablet 0  . nitroGLYCERIN (NITROSTAT) 0.4 MG SL tablet Place 1 tablet (0.4 mg total) under the tongue every 5 (five) minutes as needed for chest pain. Max 3 doses--call MD if you need to use 2 or more pills (Patient not taking: Reported on 10/24/2016) 30 tablet 0  . aspirin EC 325  MG EC tablet Take 1 tablet (325 mg total) by mouth daily. 30 tablet 0  . senna-docusate (SENOKOT-S) 8.6-50 MG tablet Take 1 tablet by mouth at bedtime. (Patient not taking: Reported on 10/04/2016) 30 tablet 0   No facility-administered medications prior to visit.     PAST MEDICAL HISTORY: Past Medical History:  Diagnosis Date  . Headache    due to elevated  BP  . History of kidney stones    "passed them"  . Hypertension   . Pancreatic injury    "damage to pancrease related to gallbladder OR"  . Pneumonia X 1  . Stroke ([place])   . Type II diabetes mellitus ([place])     PAST SURGICAL HISTORY: Past Surgical History:  Procedure Laterality Date  . CARDIAC CATHETERIZATION  08/07/2016  . CARDIAC CATHETERIZATION N/A 08/07/2016   Procedure: Left Heart Cath and Coronary Angiography;  Surgeon: Belva Crome, MD;  Location: [place] CV LAB;  Service: Cardiovascular;  Laterality: N/A;  . CARDIAC CATHETERIZATION N/A 08/07/2016   Procedure: Intravascular Pressure Wire/FFR Study;  Surgeon: Belva Crome, MD;  Location: [place] CV LAB;  Service: Cardiovascular;  Laterality: N/A;  . CORONARY ARTERY BYPASS GRAFT N/A 08/09/2016   Procedure: CORONARY ARTERY BYPASS GRAFTING (CABG) x three,SVG to distal circ OM3, SVG to Om1, LIMA to LAD-using left internal mammary artery and right leg greater saphenous vein harvested endoscopically, Sternal plating - right side;  Surgeon: Grace Isaac, MD;  Location: [place];  Service: Open Heart Surgery;  Laterality: N/A;  . FACIAL RECONSTRUCTION SURGERY  2009   "facial fractures"  . FRACTURE SURGERY    . LAPAROSCOPIC CHOLECYSTECTOMY    . TEE WITHOUT CARDIOVERSION N/A 08/09/2016   Procedure: TRANSESOPHAGEAL ECHOCARDIOGRAM (TEE);  Surgeon: Grace Isaac, MD;  Location: [place];  Service: Open Heart Surgery;  Laterality: N/A;  . TUBAL LIGATION      FAMILY HISTORY: Family History  Problem Relation Age of Onset  . Hypertension Mother   . Diabetes Mother   .  Hypertension Father   . Diabetes Father   . Heart attack Father 28    had CABG    SOCIAL HISTORY: Social History   Social History  . Marital status: Married    Spouse name: N/A  . Number of children: N/A  . Years of education: N/A   Occupational History  . Supervisor [employer]   Social History Main Topics  . Smoking status: Former Smoker    Packs/day: 0.50    Years: 20.00    Types: Cigarettes  . Smokeless tobacco: Never Used  . Alcohol use No  . Drug use: No  . Sexual activity: Yes    Birth control/ protection: None   Other Topics Concern  . Not on file   Social History Narrative   Lives with husband and daughter     PHYSICAL EXAM  Vitals:   10/24/16 1252  BP: (!) 160/80  Pulse: (!) 54  Weight: 175 lb (79.4 kg)  Height: 4' 11.5" (1.511 m)   Body mass index is 34.75 kg/m.  Generalized: Well developed, in no acute distress  Head: normocephalic and atraumatic,. Oropharynx benign  Neck: Supple, no carotid bruits  Cardiac: Regular rate rhythm, no murmur  Musculoskeletal: No deformity   Neurological examination   Mentation: Alert oriented to time, place, history taking. Attention span and concentration appropriate. Recent and remote memory intact.  Follows all commands speech and language fluent.   Cranial nerve II-XII: Fundoscopic exam reveals sharp disc margins.Pupils were equal round reactive to light extraocular movements were full, visual field were full on confrontational test. Facial sensation and strength were normal. hearing was intact to finger rubbing bilaterally. Uvula tongue midline. head turning and shoulder shrug were normal and symmetric.Tongue protrusion into cheek strength was normal. Motor: normal bulk and tone, full strength in the BUE, BLE,  Sensory: normal and symmetric to light touch, pinprick, and  Vibration, in the upper and lower extremities  Coordination: finger-nose-finger, heel-to-shin bilaterally, no dysmetria Reflexes: 1+ upper  lower and symmetric, plantar responses were flexor bilaterally. Gait and Station: Rising up from seated position without assistance, normal stance,  moderate stride, good arm swing, smooth turning, able to perform tiptoe, and heel walking without difficulty. Tandem gait is steady  DIAGNOSTIC DATA (LABS, IMAGING, TESTING) - I reviewed patient records, labs, notes, testing and imaging myself where available.  Lab Results  Component Value Date   WBC 7.5 10/04/2016   HGB 10.2 (L) 10/04/2016   HCT 31.6 (L) 10/04/2016   MCV 84.5 10/04/2016   PLT 456 (H) 10/04/2016      Component Value Date/Time   NA 137 10/04/2016 1853   K 3.2 (L) 10/04/2016 1853   CL 107 10/04/2016 1853   CO2 21 (L) 10/04/2016 1853   GLUCOSE 212 (H) 10/04/2016 1853   BUN 10 10/04/2016 1853   CREATININE 0.63 10/04/2016 1853   CREATININE 0.72 09/18/2016 1117   CALCIUM 8.7 (L) 10/04/2016 1853   PROT 6.4 (L) 08/20/2016 0421   ALBUMIN 2.3 (L) 08/20/2016 0421   AST 25 08/20/2016 0421   ALT 28 08/20/2016 0421   ALKPHOS 78 08/20/2016 0421   BILITOT 0.5 08/20/2016 0421   GFRNONAA >60 10/04/2016 1853   GFRAA >60 10/04/2016 1853   Lab Results  Component Value Date   CHOL 120 08/08/2016   HDL 35 (L) 08/08/2016   LDLCALC 67 08/08/2016   TRIG 89 08/08/2016   CHOLHDL 3.4 08/08/2016   Lab Results  Component Value Date   HGBA1C 13.1 (H) 08/08/2016    ASSESSMENT AND PLAN  46 y.o. year old female  returns for hospital follow-up after admission  for chest pain status post CABG then developed right-sided weakness and neglect she did not receive the TPA due to unclear postop status. CT of the head no acute  abnormality. CTA head and neck proximal BA  thrombosis left VA stenosis. Likely left brain infarcts could be due to  procedure or due to multiple stroke risk factors stated above.The patient is a current patient of Dr. Erlinda Hong who is out of the office today . This note is sent to the work in doctor.     PLAN: Stressed the  importance of management of risk factors to prevent further stroke Continue Plavix for secondary stroke prevention Maintain strict control of hypertension with blood pressure goal below 130/90, today's reading 160/80 continue antihypertensive medications Control of diabetes with hemoglobin A1c below 6.5 followed by primary care most recent hemoglobin A1c 13.1 continue  diabetic medications Cholesterol with LDL cholesterol less than 70, followed by primary care,  most recent 67 continue statin drugs Exercise by walking, okay for cardiac rehabilitation,  eat healthy diet with whole grains,  fresh fruits and vegetables Discussed risk for recurrent stroke/ TIA and answered additional questions Will repeat CTA head and neck follow up on BA thrombus.  Follow up in 4 months This was a prolonged visit requiring 25 minutes and medical decision making of high complexity with extensive review of history, hospital chart, counseling and answering questions Dennie Bible, [facility], [facility], APRN  [facility] [address] [place], [place] [address] [phone]

## 2016-10-25 ENCOUNTER — Other Ambulatory Visit: Payer: Self-pay | Admitting: *Deleted

## 2016-10-25 DIAGNOSIS — I639 Cerebral infarction, unspecified: Secondary | ICD-10-CM

## 2016-10-25 NOTE — Progress Notes (Signed)
I have read the note, and I agree with the clinical assessment and plan.  Adalind Weitz A. Jaydence Arnesen, MD, PhD Certified in Neurology, Clinical Neurophysiology, Sleep Medicine, Pain Medicine and Neuroimaging  [facility] [address], [address] [place], [place] [address] [phone]  

## 2016-10-29 ENCOUNTER — Telehealth: Payer: Self-pay | Admitting: Physician Assistant

## 2016-10-29 ENCOUNTER — Encounter (HOSPITAL_COMMUNITY)
Admission: RE | Admit: 2016-10-29 | Discharge: 2016-10-29 | Disposition: A | Discharge status: Home / Self Care | Payer: BLUE CROSS/BLUE SHIELD | Source: Ambulatory Visit | Attending: Cardiothoracic Surgery | Admitting: Cardiothoracic Surgery

## 2016-10-29 ENCOUNTER — Telehealth: Payer: Self-pay | Admitting: Cardiology

## 2016-10-29 ENCOUNTER — Ambulatory Visit: Payer: BLUE CROSS/BLUE SHIELD | Admitting: Physical Therapy

## 2016-10-29 DIAGNOSIS — Z951 Presence of aortocoronary bypass graft: Secondary | ICD-10-CM

## 2016-10-29 DIAGNOSIS — I214 Non-ST elevation (NSTEMI) myocardial infarction: Secondary | ICD-10-CM

## 2016-10-29 NOTE — Telephone Encounter (Signed)
[name] could not get a triage nurse right away,she decided to get the doctor on call in the hospital,

## 2016-10-29 NOTE — Telephone Encounter (Signed)
    Our office contacted her to make BP appointment but she refused saying she wanted her PCP to manage BP  Cline Crock PA-C  [facility]

## 2016-10-29 NOTE — Progress Notes (Signed)
Incomplete Session Note  Patient Details  Name: [name] MRN: [id number] Date of Birth: [birth year] Referring Provider:    Franne Grip did not complete her rehab session.   Pt BP upon arrival 174/94.  Recheck BP:  166/88.  Pt reports she has previously been given cardiology guidelines to participate in physical therapy in this BP range.  pc to Cathlean Cower to advise of pt BP readings, obtain exercise BP guidelines and clearance to exercise since most recent ED visit.  Katie gave clearance for pt to perform 6 minute walk test with BP:  174/94 and ok to exercise with recent ED CP evaluation.  Pt gave order for pt to increase lisinopril to  daily.    Pt spoke with Carlean Jews, PA via telephone who advised pt to increase lisinopril  daily.     She decided to not participate in program after being counseled about her high blood pressure and meeting with pharmacist.  Pt reports this is usual for her and to be expected. Pt is also disappointed she was not able to attend cardiac rehab prior to her physical therapy.  She has now returned to work which will make it difficult for her attend.  Pt promptly took off telemetry monitor and left department, stating "I don't want to do cardiac therapy." Deveron Furlong, RN, BSN -Cardiac Pulmonary Rehab

## 2016-10-29 NOTE — Telephone Encounter (Signed)
Returned call to [employer] patient was at cardiac rehab and she had spoken to Robinhood PA (see telephone note and CR note) at the hospital who spoke to patient in regards to elevated BP but patient abruptly left.    No further needs or concerns at this time.

## 2016-10-29 NOTE — Progress Notes (Signed)
Cardiac Rehab Medication Review by a Pharmacist  Does the patient  feel that his/her medications are working for him/her?  yes  Has the patient been experiencing any side effects to the medications prescribed?  no  Does the patient measure his/her own blood pressure or blood glucose at home?  yes - Daily  Does the patient have any problems obtaining medications due to transportation or finances?   no  Understanding of regimen: good Understanding of indications: good Potential of compliance: good    Pharmacist comments: Ms. Elderkin is a 46 yo female who presents to cardiac rehab without assistance. She checks her blood pressure daily and her blood glucose 3 times a day. Patient has not been taking potassium. Patient advised to ask doctor about liquid formulation while still taking lasix.     Hillis Range, PharmD PGY1 Pharmacy Resident Pager: [phone] 10/29/2016 8:39 AM

## 2016-10-29 NOTE — Telephone Encounter (Signed)
    Received a call from cardiac rehab about her BP being too high. SBP in 170s. Recently seen by Corine Shelter and Lisinopril increased to  daily. Also clonidine had been added by Dr. Jeanella Anton. I asked patient to please increase her lisinopril to  daily and I have arranged a BP appointment in the hypertension clinic at [facility]. She will need a BMET at that time.   Cline Crock PA-C  [facility]

## 2016-10-30 ENCOUNTER — Ambulatory Visit
Admission: RE | Admit: 2016-10-30 | Discharge: 2016-10-30 | Disposition: A | Discharge status: Home / Self Care | Payer: BLUE CROSS/BLUE SHIELD | Source: Ambulatory Visit | Attending: Nurse Practitioner | Admitting: Nurse Practitioner

## 2016-10-30 DIAGNOSIS — I639 Cerebral infarction, unspecified: Secondary | ICD-10-CM

## 2016-10-30 MED ORDER — IOPAMIDOL (ISOVUE-370) INJECTION 76%
75.0000 mL | Freq: Once | INTRAVENOUS | Status: AC | PRN
  Administered 2016-10-30: 75 mL via INTRAVENOUS

## 2016-10-31 ENCOUNTER — Telehealth: Payer: Self-pay | Admitting: *Deleted

## 2016-10-31 ENCOUNTER — Ambulatory Visit: Payer: BLUE CROSS/BLUE SHIELD | Admitting: Physical Therapy

## 2016-10-31 NOTE — Telephone Encounter (Signed)
Per Darrol Angel, NP, LVM informing patient [name] reviewed her CTA head and neck report and no significant change was noted. Advised she will have Dr. Roda Shutters look at the actual films when he returns to the office in two weeks.Left number for any questions.

## 2016-11-01 ENCOUNTER — Ambulatory Visit (HOSPITAL_BASED_OUTPATIENT_CLINIC_OR_DEPARTMENT_OTHER): Payer: BLUE CROSS/BLUE SHIELD | Admitting: Physical Medicine & Rehabilitation

## 2016-11-01 ENCOUNTER — Encounter: Payer: BLUE CROSS/BLUE SHIELD | Attending: Physical Medicine & Rehabilitation

## 2016-11-01 ENCOUNTER — Encounter: Payer: Self-pay | Admitting: Physical Medicine & Rehabilitation

## 2016-11-01 VITALS — BP 191/106 | HR 62

## 2016-11-01 DIAGNOSIS — Z951 Presence of aortocoronary bypass graft: Secondary | ICD-10-CM | POA: Diagnosis not present

## 2016-11-01 DIAGNOSIS — E669 Obesity, unspecified: Secondary | ICD-10-CM | POA: Diagnosis not present

## 2016-11-01 DIAGNOSIS — I25810 Atherosclerosis of coronary artery bypass graft(s) without angina pectoris: Secondary | ICD-10-CM | POA: Diagnosis not present

## 2016-11-01 DIAGNOSIS — I6312 Cerebral infarction due to embolism of basilar artery: Secondary | ICD-10-CM | POA: Insufficient documentation

## 2016-11-01 DIAGNOSIS — R269 Unspecified abnormalities of gait and mobility: Secondary | ICD-10-CM | POA: Diagnosis not present

## 2016-11-01 DIAGNOSIS — E1169 Type 2 diabetes mellitus with other specified complication: Secondary | ICD-10-CM | POA: Insufficient documentation

## 2016-11-01 DIAGNOSIS — Z8673 Personal history of transient ischemic attack (TIA), and cerebral infarction without residual deficits: Secondary | ICD-10-CM | POA: Diagnosis not present

## 2016-11-01 DIAGNOSIS — Z9851 Tubal ligation status: Secondary | ICD-10-CM | POA: Insufficient documentation

## 2016-11-01 DIAGNOSIS — Z87891 Personal history of nicotine dependence: Secondary | ICD-10-CM | POA: Diagnosis not present

## 2016-11-01 DIAGNOSIS — I10 Essential (primary) hypertension: Secondary | ICD-10-CM | POA: Diagnosis not present

## 2016-11-01 DIAGNOSIS — Z9049 Acquired absence of other specified parts of digestive tract: Secondary | ICD-10-CM | POA: Insufficient documentation

## 2016-11-01 DIAGNOSIS — Z833 Family history of diabetes mellitus: Secondary | ICD-10-CM | POA: Diagnosis not present

## 2016-11-01 DIAGNOSIS — R531 Weakness: Secondary | ICD-10-CM | POA: Insufficient documentation

## 2016-11-01 DIAGNOSIS — E1159 Type 2 diabetes mellitus with other circulatory complications: Secondary | ICD-10-CM | POA: Diagnosis not present

## 2016-11-01 DIAGNOSIS — Z87442 Personal history of urinary calculi: Secondary | ICD-10-CM | POA: Diagnosis not present

## 2016-11-01 DIAGNOSIS — Z8249 Family history of ischemic heart disease and other diseases of the circulatory system: Secondary | ICD-10-CM | POA: Insufficient documentation

## 2016-11-01 DIAGNOSIS — R51 Headache: Secondary | ICD-10-CM | POA: Diagnosis not present

## 2016-11-01 NOTE — Progress Notes (Signed)
Subjective:    Patient ID: [name], female    DOB: 1970-10-16, 46 y.o.   MRN: [id number]  HPI Back to work full time, finished outpt rehab for post stroke Sees Dr Jeanella Anton for PCP  Did not start cardiac rehab, Cardiac restrictions for diastolic >100  Does HEP  Dressing and bathing I, driving  Pt feels 40% recovered  Not smoking anymore Pain Inventory Average Pain 5 Pain Right Now 0 My pain is sharp, dull, stabbing, tingling and aching  In the last 24 hours, has pain interfered with the following? General activity 0 Relation with others 0 Enjoyment of life 0 What TIME of day is your pain at its worst? night Sleep (in general) Fair  Pain is worse with: unsure Pain improves with: medication Relief from Meds: 7  Mobility walk without assistance ability to climb steps?  yes do you drive?  yes  Function employed # of hrs/week 40  Neuro/Psych numbness  Prior Studies Any changes since last visit?  no  Physicians involved in your care Any changes since last visit?  no   Family History  Problem Relation Age of Onset  . Hypertension Mother   . Diabetes Mother   . Hypertension Father   . Diabetes Father   . Heart attack Father 85    had CABG  . Prostate cancer Father   . Stomach cancer Maternal Grandmother   . Diabetes Paternal Grandmother     had L amputation  . Brain cancer Paternal Grandfather    Social History   Social History  . Marital status: Married    Spouse name: N/A  . Number of children: N/A  . Years of education: N/A   Occupational History  . Supervisor [employer]   Social History Main Topics  . Smoking status: Former Smoker    Packs/day: 0.50    Years: 20.00    Types: Cigarettes  . Smokeless tobacco: Never Used  . Alcohol use No  . Drug use: No  . Sexual activity: Yes    Birth control/ protection: None   Other Topics Concern  . Not on file   Social History Narrative   Lives with husband in home.  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  Has AD and 3 children.     Past Surgical History:  Procedure Laterality Date  . CARDIAC CATHETERIZATION  08/07/2016  . CARDIAC CATHETERIZATION N/A 08/07/2016   Procedure: Left Heart Cath and Coronary Angiography;  Surgeon: Lyn Records, MD;  Location: [facility] INVASIVE CV LAB;  Service: Cardiovascular;  Laterality: N/A;  . CARDIAC CATHETERIZATION N/A 08/07/2016   Procedure: Intravascular Pressure Wire/FFR Study;  Surgeon: Lyn Records, MD;  Location: [facility] INVASIVE CV LAB;  Service: Cardiovascular;  Laterality: N/A;  . CORONARY ARTERY BYPASS GRAFT N/A 08/09/2016   Procedure: CORONARY ARTERY BYPASS GRAFTING (CABG) x three,SVG to distal circ OM3, SVG to Om1, LIMA to LAD-using left internal mammary artery and right leg greater saphenous vein harvested endoscopically, Sternal plating - right side;  Surgeon: Delight Ovens, MD;  Location: [facility] OR;  Service: Open Heart Surgery;  Laterality: N/A;  . FACIAL RECONSTRUCTION SURGERY  2009   "facial fractures"  . FRACTURE SURGERY    . LAPAROSCOPIC CHOLECYSTECTOMY    . TEE WITHOUT CARDIOVERSION N/A 08/09/2016   Procedure: TRANSESOPHAGEAL ECHOCARDIOGRAM (TEE);  Surgeon: Delight Ovens, MD;  Location: [facility] OR;  Service: Open Heart Surgery;  Laterality: N/A;  . TUBAL LIGATION     Past Medical History:  Diagnosis Date  . Headache    due to elevated  BP  . History of kidney stones    "passed them"  . Hypertension   . Pancreatic injury    "damage to pancrease related to gallbladder OR"  . Pneumonia X 1  . Stroke (HCC)   . Type II diabetes mellitus (HCC)    LMP 10/10/2016   Opioid Risk Score:   Fall Risk Score:  `1  Depression screen PHQ 2/9  Depression screen PHQ 2/9 09/04/2016  Decreased Interest 1  Down, Depressed, Hopeless 3  PHQ - 2 Score 4  Altered sleeping 3  Tired, decreased energy 1  Change in appetite 2  Feeling bad or failure about yourself  3  Trouble concentrating 1  Moving slowly or fidgety/restless 2   Suicidal thoughts 0  PHQ-9 Score 16    Review of Systems  Constitutional: Negative.   HENT: Negative.   Eyes: Negative.   Respiratory: Negative.   Cardiovascular: Negative.   Gastrointestinal: Negative.   Endocrine: Negative.   Genitourinary: Negative.   Musculoskeletal: Negative.   Skin: Negative.   Allergic/Immunologic: Negative.   Neurological: Negative.   Hematological: Negative.   Psychiatric/Behavioral: Negative.   All other systems reviewed and are negative.      Objective:   Physical Exam  Constitutional: She is oriented to person, place, and time. She appears well-developed and well-nourished.  HENT:  Head: Normocephalic and atraumatic.  Eyes: Conjunctivae are normal. Pupils are equal, round, and reactive to light.  Neck: Normal range of motion.  Cardiovascular:  Mild peri incisional tenderness  Pulmonary/Chest: No respiratory distress.  Neurological: She is alert and oriented to person, place, and time. She displays no atrophy. She exhibits normal muscle tone. Coordination and gait normal.  Motor strength is 5/5 bilateral deltoid, biceps, triceps, Vega, hip flexor, knee extensor, ankle dorsiflexor and plantar flexor. Sensation intact pinprick bilateral upper and lower limb  Skin: Skin is warm and dry.  Psychiatric: She has a normal mood and affect. Her behavior is normal. Judgment and thought content normal.  Nursing note and vitals reviewed.          Assessment & Plan:  1. Cerebral infarcts, post CABG with excellent recovery. No further PT or OT recommended. Patient is back to work and is driving without restrictions. She still has a lifting restriction due to her sternotomy imposed by CV TS.  Physical medicine rehabilitation follow-up on a when necessary basis  Patient needs to follow-up with cardiology and neurology

## 2016-11-01 NOTE — Patient Instructions (Signed)
Please walk at least 30 min per day Ask cardiology about cardiac rehab

## 2016-11-04 ENCOUNTER — Ambulatory Visit (HOSPITAL_COMMUNITY): Payer: BLUE CROSS/BLUE SHIELD

## 2016-11-05 ENCOUNTER — Ambulatory Visit: Payer: BLUE CROSS/BLUE SHIELD | Admitting: Physical Therapy

## 2016-11-06 ENCOUNTER — Ambulatory Visit (HOSPITAL_COMMUNITY): Payer: BLUE CROSS/BLUE SHIELD

## 2016-11-07 ENCOUNTER — Ambulatory Visit: Payer: BLUE CROSS/BLUE SHIELD | Admitting: Physical Therapy

## 2016-11-08 ENCOUNTER — Ambulatory Visit (HOSPITAL_COMMUNITY): Payer: BLUE CROSS/BLUE SHIELD

## 2016-11-11 ENCOUNTER — Ambulatory Visit (HOSPITAL_COMMUNITY): Payer: BLUE CROSS/BLUE SHIELD

## 2016-11-12 ENCOUNTER — Ambulatory Visit: Payer: BLUE CROSS/BLUE SHIELD | Admitting: Physical Therapy

## 2016-11-13 ENCOUNTER — Ambulatory Visit (HOSPITAL_COMMUNITY): Payer: BLUE CROSS/BLUE SHIELD

## 2016-11-14 ENCOUNTER — Ambulatory Visit: Payer: BLUE CROSS/BLUE SHIELD | Admitting: Physical Therapy

## 2016-11-15 ENCOUNTER — Ambulatory Visit (HOSPITAL_COMMUNITY): Payer: BLUE CROSS/BLUE SHIELD

## 2016-11-18 ENCOUNTER — Ambulatory Visit (HOSPITAL_COMMUNITY): Payer: BLUE CROSS/BLUE SHIELD

## 2016-11-20 ENCOUNTER — Ambulatory Visit (HOSPITAL_COMMUNITY): Payer: BLUE CROSS/BLUE SHIELD

## 2016-11-22 ENCOUNTER — Ambulatory Visit (HOSPITAL_COMMUNITY): Payer: BLUE CROSS/BLUE SHIELD

## 2016-11-25 ENCOUNTER — Ambulatory Visit (HOSPITAL_COMMUNITY): Payer: BLUE CROSS/BLUE SHIELD

## 2016-11-27 ENCOUNTER — Ambulatory Visit (HOSPITAL_COMMUNITY): Payer: BLUE CROSS/BLUE SHIELD

## 2016-11-29 ENCOUNTER — Ambulatory Visit: Payer: BLUE CROSS/BLUE SHIELD | Admitting: Cardiology

## 2016-11-29 ENCOUNTER — Ambulatory Visit (HOSPITAL_COMMUNITY): Payer: BLUE CROSS/BLUE SHIELD

## 2016-12-02 ENCOUNTER — Ambulatory Visit (HOSPITAL_COMMUNITY): Payer: BLUE CROSS/BLUE SHIELD

## 2016-12-02 NOTE — Progress Notes (Deleted)
Cardiology Office Note   Date:  12/02/2016   ID:  [name], DOB 11-17-1970, MRN [id number]  PCP:  Lin Landsman, MD  Cardiologist:   Minus Breeding, MD    No chief complaint on file.     History of Present Illness: [name] is a 46 y.o. female who presents for follow up after CABG.  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  ***   She went to therapy afterward.  She is home now and doing physical therapy but is been unable to go to the last couple of sessions because of hypertension. She has some residual right arm and leg weakness but otherwise feels pretty good. She has some sternal chest discomfort but it's mild. She's had no substernal discomfort. She's had no fevers, chills. She denies any nausea vomiting or diaphoresis. She's had no palpitations, presyncope or syncope. She's had a little pain in her right leg at the [place]   Past Medical History:  Diagnosis Date  . Headache    due to elevated  BP  . History of kidney stones    "passed them"  . Hypertension   . Pancreatic injury    "damage to pancrease related to gallbladder OR"  . Pneumonia X 1  . Stroke ([place])   . Type II diabetes mellitus ([place])     Past Surgical History:  Procedure Laterality Date  . CARDIAC CATHETERIZATION  08/07/2016  . CARDIAC CATHETERIZATION N/A 08/07/2016   Procedure: Left Heart Cath and Coronary Angiography;  Surgeon: Belva Crome, MD;  Location: [place] CV LAB;  Service: Cardiovascular;  Laterality: N/A;  . CARDIAC CATHETERIZATION N/A 08/07/2016   Procedure: Intravascular Pressure Wire/FFR Study;  Surgeon: Belva Crome, MD;  Location: [place] CV LAB;  Service: Cardiovascular;  Laterality: N/A;  . CORONARY ARTERY BYPASS GRAFT N/A 08/09/2016   Procedure: CORONARY ARTERY BYPASS GRAFTING (CABG) x three,SVG to distal circ OM3, SVG to Om1, LIMA to LAD-using left internal mammary artery and right leg greater saphenous vein harvested endoscopically, Sternal  plating - right side;  Surgeon: Grace Isaac, MD;  Location: [place];  Service: Open Heart Surgery;  Laterality: N/A;  . FACIAL RECONSTRUCTION SURGERY  2009   "facial fractures"  . FRACTURE SURGERY    . LAPAROSCOPIC CHOLECYSTECTOMY    . TEE WITHOUT CARDIOVERSION N/A 08/09/2016   Procedure: TRANSESOPHAGEAL ECHOCARDIOGRAM (TEE);  Surgeon: Grace Isaac, MD;  Location: [place];  Service: Open Heart Surgery;  Laterality: N/A;  . TUBAL LIGATION       Current Outpatient Prescriptions  Medication Sig Dispense Refill  . acetaminophen (TYLENOL) 500 MG tablet Take 1,000 mg by mouth every 8 (eight) hours as needed.    Marland Kitchen aspirin EC 81 MG tablet Take 81 mg by mouth daily.    Marland Kitchen atorvastatin (LIPITOR) 80 MG tablet Take 1 tablet (80 mg total) by mouth daily at 6 PM. 30 tablet 0  . B-D UF III MINI PEN NEEDLES 31G X 5 MM MISC     . blood glucose meter kit and supplies KIT Dispense based on patient and insurance preference. Use up to four times daily as directed. (FOR ICD-9 250.00, 250.01). 1 each 0  . carisoprodol (SOMA) 350 MG tablet Take 350 mg by mouth at bedtime as needed.     . cloNIDine (CATAPRES) 0.2 MG tablet Take 0.2 mg by mouth daily.    . clopidogrel (PLAVIX) 75 MG tablet Take 1 tablet (75 mg total)  by mouth daily. 30 tablet 0  . furosemide (LASIX) 40 MG tablet Take 1 tablet (40 mg total) by mouth daily. 30 tablet 0  . Insulin Detemir (LEVEMIR) 100 UNIT/ML Pen Inject 30 Units into the skin daily after breakfast. (Patient taking differently: Inject 30 Units into the skin daily at 10 pm. ) 15 mL 0  . Insulin Pen Needle (CLICKFINE PEN NEEDLES) 31G X 8 MM MISC 1 application by Does not apply route as directed. 100 each 0  . lisinopril (PRINIVIL,ZESTRIL) 20 MG tablet Take 1 tablet (20 mg total) by mouth daily. 90 tablet 3  . metoprolol tartrate (LOPRESSOR) 25 MG tablet Take 1 tablet (25 mg total) by mouth 2 (two) times daily. 60 tablet 0  . nitroGLYCERIN (NITROSTAT) 0.4 MG SL tablet Place 1 tablet  (0.4 mg total) under the tongue every 5 (five) minutes as needed for chest pain. Max 3 doses--call MD if you need to use 2 or more pills 30 tablet 0  . NOVOLOG 100 UNIT/ML injection Takes as a Sliding Scale prn patient states she is not sure how much insulin she takes    . ONETOUCH VERIO test strip     . pantoprazole (PROTONIX) 40 MG tablet Take 1 tablet (40 mg total) by mouth daily before breakfast. 30 tablet 0  . potassium chloride (K-DUR,KLOR-CON) 10 MEQ tablet Take 10 mEq by mouth daily.    . temazepam (RESTORIL) 15 MG capsule Take 15 mg by mouth at bedtime as needed for sleep.     . traMADol (ULTRAM) 50 MG tablet Take 1 tablet (50 mg total) by mouth every 12 (twelve) hours as needed for moderate pain. (Patient taking differently: Take 50 mg by mouth every 12 (twelve) hours as needed for moderate pain. ) 15 tablet 0   No current facility-administered medications for this visit.     Allergies:   Eugenol; Hydrochlorothiazide; Morphine and related; Percocet [oxycodone-acetaminophen]; Shellfish allergy; Sulfa antibiotics; Tomato; and Nicotine    ROS:  Please see the history of present illness.   Otherwise, review of systems are positive for ***.   All other systems are reviewed and negative.    PHYSICAL EXAM: VS:  There were no vitals taken for this visit. , BMI There is no height or weight on file to calculate BMI. GENERAL:  Well appearing, *** NECK:  No jugular venous distention, waveform within normal limits, carotid upstroke brisk and symmetric, no bruits, no thyromegaly CHEST:  Well healed sternotomy scar LUNGS:  Clear to auscultation bilaterally BACK:  No CVA tenderness CHEST:  Unremarkable HEART:  PMI not displaced or sustained,S1 and S2 within normal limits, no S3, no S4, no clicks, no rubs, no murmurs ABD:  Flat, positive bowel sounds normal in frequency in pitch, no bruits, no rebound, no guarding, no midline pulsatile mass, no hepatomegaly, no splenomegaly EXT:  2 plus pulses  throughout, no edema, no cyanosis no clubbing, trace edema   EKG:  EKG is not *** ordered today.   Recent Labs: 08/10/2016: Magnesium 2.3 08/20/2016: ALT 28 10/04/2016: BUN 10; Creatinine, Ser 0.63; Hemoglobin 10.2; Platelets 456; Potassium 3.2; Sodium 137    Lipid Panel    Component Value Date/Time   CHOL 120 08/08/2016 0304   TRIG 89 08/08/2016 0304   HDL 35 (L) 08/08/2016 0304   CHOLHDL 3.4 08/08/2016 0304   VLDL 18 08/08/2016 0304   LDLCALC 67 08/08/2016 0304      Wt Readings from Last 3 Encounters:  10/24/16 175 lb (79.4 kg)  10/04/16 172 lb (78 kg)  09/26/16 170 lb (77.1 kg)      Other studies Reviewed: Additional studies/ records that were reviewed today include: ***. Review of the above records demonstrates:  ***   ASSESSMENT AND PLAN:  CAD:  *** The patient has no new sypmtoms.  No further cardiovascular testing is indicated.  We will continue with aggressive risk reduction and meds as listed. Eventually get her into cardiac rehabilitation.  DIABETES:   ***  EMBOLIC CVA:   *** She is going to continue physical therapy. For now I'll continue aspirin and Plavix.  HTN:    Her blood pressure is ***not well controlled. I did increase her lisinopril 20 mg daily. She'll take an extra 15 mg today. I'm going to have her come back to see our PA in about a week to consider next med titration which might be increasing her beta blocker.  She can have a basic metabolic profile when she returns for follow-up.   Current medicines are reviewed at length with the patient today.  The patient does not have concerns regarding medicines.  The following changes have been made:  ***  Labs/ tests ordered today include: *** No orders of the defined types were placed in this encounter.    Disposition:   FU with APP in *** .     Signed, Minus Breeding, MD  12/02/2016 7:58 AM    [facility]

## 2016-12-03 ENCOUNTER — Ambulatory Visit: Payer: BLUE CROSS/BLUE SHIELD | Admitting: Cardiology

## 2016-12-04 ENCOUNTER — Telehealth: Payer: Self-pay | Admitting: *Deleted

## 2016-12-04 ENCOUNTER — Ambulatory Visit (HOSPITAL_COMMUNITY): Payer: BLUE CROSS/BLUE SHIELD

## 2016-12-04 NOTE — Telephone Encounter (Signed)
Left message for patient to call and reschedule 12/10/16 appointment with Dr. Antoine PocheHochrein

## 2016-12-06 ENCOUNTER — Ambulatory Visit (HOSPITAL_COMMUNITY): Payer: BLUE CROSS/BLUE SHIELD

## 2016-12-09 ENCOUNTER — Ambulatory Visit (HOSPITAL_COMMUNITY): Payer: BLUE CROSS/BLUE SHIELD

## 2016-12-10 ENCOUNTER — Ambulatory Visit: Payer: BLUE CROSS/BLUE SHIELD | Admitting: Cardiology

## 2016-12-11 ENCOUNTER — Ambulatory Visit (HOSPITAL_COMMUNITY): Payer: BLUE CROSS/BLUE SHIELD

## 2016-12-13 ENCOUNTER — Ambulatory Visit (HOSPITAL_COMMUNITY): Payer: BLUE CROSS/BLUE SHIELD

## 2016-12-16 ENCOUNTER — Ambulatory Visit (HOSPITAL_COMMUNITY): Payer: BLUE CROSS/BLUE SHIELD

## 2016-12-18 ENCOUNTER — Ambulatory Visit (HOSPITAL_COMMUNITY): Payer: BLUE CROSS/BLUE SHIELD

## 2016-12-20 ENCOUNTER — Ambulatory Visit (HOSPITAL_COMMUNITY): Payer: BLUE CROSS/BLUE SHIELD

## 2016-12-25 ENCOUNTER — Ambulatory Visit (HOSPITAL_COMMUNITY): Payer: BLUE CROSS/BLUE SHIELD

## 2016-12-27 ENCOUNTER — Ambulatory Visit (HOSPITAL_COMMUNITY): Payer: BLUE CROSS/BLUE SHIELD

## 2016-12-30 ENCOUNTER — Ambulatory Visit (HOSPITAL_COMMUNITY): Payer: BLUE CROSS/BLUE SHIELD

## 2017-01-01 ENCOUNTER — Ambulatory Visit (HOSPITAL_COMMUNITY): Payer: BLUE CROSS/BLUE SHIELD

## 2017-01-03 ENCOUNTER — Ambulatory Visit (HOSPITAL_COMMUNITY): Payer: BLUE CROSS/BLUE SHIELD

## 2017-01-06 ENCOUNTER — Ambulatory Visit (HOSPITAL_COMMUNITY): Payer: BLUE CROSS/BLUE SHIELD

## 2017-01-07 NOTE — Progress Notes (Deleted)
Cardiology Office Note   Date:  01/07/2017   ID:  [name], DOB [birth year], MRN [id number]  PCP:  Lin Landsman, MD  Cardiologist:   Minus Breeding, MD    No chief complaint on file.     History of Present Illness: [name] is a 46 y.o. female who presents for follow up after CABG.  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  She went to therapy afterward.  She has had trouble with difficult to control HTN.  ***     She is home now and doing physical therapy but is been unable to go to the last couple of sessions because of hypertension. She has some residual right arm and leg weakness but otherwise feels pretty good. She has some sternal chest discomfort but it's mild. She's had no substernal discomfort. She's had no fevers, chills. She denies any nausea vomiting or diaphoresis. She's had no palpitations, presyncope or syncope. She's had a little pain in her right leg at the [place]   Past Medical History:  Diagnosis Date  . Headache    due to elevated  BP  . History of kidney stones    "passed them"  . Hypertension   . Pancreatic injury    "damage to pancrease related to gallbladder OR"  . Pneumonia X 1  . Stroke ([place])   . Type II diabetes mellitus ([place])     Past Surgical History:  Procedure Laterality Date  . CARDIAC CATHETERIZATION  08/07/2016  . CARDIAC CATHETERIZATION N/A 08/07/2016   Procedure: Left Heart Cath and Coronary Angiography;  Surgeon: Belva Crome, MD;  Location: [place] CV LAB;  Service: Cardiovascular;  Laterality: N/A;  . CARDIAC CATHETERIZATION N/A 08/07/2016   Procedure: Intravascular Pressure Wire/FFR Study;  Surgeon: Belva Crome, MD;  Location: [place] CV LAB;  Service: Cardiovascular;  Laterality: N/A;  . CORONARY ARTERY BYPASS GRAFT N/A 08/09/2016   Procedure: CORONARY ARTERY BYPASS GRAFTING (CABG) x three,SVG to distal circ OM3, SVG to Om1, LIMA to LAD-using left internal mammary artery and right leg  greater saphenous vein harvested endoscopically, Sternal plating - right side;  Surgeon: Grace Isaac, MD;  Location: [place];  Service: Open Heart Surgery;  Laterality: N/A;  . FACIAL RECONSTRUCTION SURGERY  2009   "facial fractures"  . FRACTURE SURGERY    . LAPAROSCOPIC CHOLECYSTECTOMY    . TEE WITHOUT CARDIOVERSION N/A 08/09/2016   Procedure: TRANSESOPHAGEAL ECHOCARDIOGRAM (TEE);  Surgeon: Grace Isaac, MD;  Location: [place];  Service: Open Heart Surgery;  Laterality: N/A;  . TUBAL LIGATION       Current Outpatient Prescriptions  Medication Sig Dispense Refill  . acetaminophen (TYLENOL) 500 MG tablet Take 1,000 mg by mouth every 8 (eight) hours as needed.    Marland Kitchen aspirin EC 81 MG tablet Take 81 mg by mouth daily.    Marland Kitchen atorvastatin (LIPITOR) 80 MG tablet Take 1 tablet (80 mg total) by mouth daily at 6 PM. 30 tablet 0  . B-D UF III MINI PEN NEEDLES 31G X 5 MM MISC     . blood glucose meter kit and supplies KIT Dispense based on patient and insurance preference. Use up to four times daily as directed. (FOR ICD-9 250.00, 250.01). 1 each 0  . carisoprodol (SOMA) 350 MG tablet Take 350 mg by mouth at bedtime as needed.     . cloNIDine (CATAPRES) 0.2 MG tablet Take 0.2 mg by mouth daily.    Marland Kitchen  clopidogrel (PLAVIX) 75 MG tablet Take 1 tablet (75 mg total) by mouth daily. 30 tablet 0  . furosemide (LASIX) 40 MG tablet Take 1 tablet (40 mg total) by mouth daily. 30 tablet 0  . Insulin Detemir (LEVEMIR) 100 UNIT/ML Pen Inject 30 Units into the skin daily after breakfast. (Patient taking differently: Inject 30 Units into the skin daily at 10 pm. ) 15 mL 0  . Insulin Pen Needle (CLICKFINE PEN NEEDLES) 31G X 8 MM MISC 1 application by Does not apply route as directed. 100 each 0  . lisinopril (PRINIVIL,ZESTRIL) 20 MG tablet Take 1 tablet (20 mg total) by mouth daily. 90 tablet 3  . metoprolol tartrate (LOPRESSOR) 25 MG tablet Take 1 tablet (25 mg total) by mouth 2 (two) times daily. 60 tablet 0  .  nitroGLYCERIN (NITROSTAT) 0.4 MG SL tablet Place 1 tablet (0.4 mg total) under the tongue every 5 (five) minutes as needed for chest pain. Max 3 doses--call MD if you need to use 2 or more pills 30 tablet 0  . NOVOLOG 100 UNIT/ML injection Takes as a Sliding Scale prn patient states she is not sure how much insulin she takes    . ONETOUCH VERIO test strip     . pantoprazole (PROTONIX) 40 MG tablet Take 1 tablet (40 mg total) by mouth daily before breakfast. 30 tablet 0  . potassium chloride (K-DUR,KLOR-CON) 10 MEQ tablet Take 10 mEq by mouth daily.    . temazepam (RESTORIL) 15 MG capsule Take 15 mg by mouth at bedtime as needed for sleep.     . traMADol (ULTRAM) 50 MG tablet Take 1 tablet (50 mg total) by mouth every 12 (twelve) hours as needed for moderate pain. (Patient taking differently: Take 50 mg by mouth every 12 (twelve) hours as needed for moderate pain. ) 15 tablet 0   No current facility-administered medications for this visit.     Allergies:   Eugenol; Hydrochlorothiazide; Morphine and related; Percocet [oxycodone-acetaminophen]; Shellfish allergy; Sulfa antibiotics; Tomato; and Nicotine    ROS:  Please see the history of present illness.   Otherwise, review of systems are positive for ***.   All other systems are reviewed and negative.    PHYSICAL EXAM: VS:  There were no vitals taken for this visit. , BMI There is no height or weight on file to calculate BMI.  GENERAL:  Well appearing NECK:  No jugular venous distention, waveform within normal limits, carotid upstroke brisk and symmetric, no bruits, no thyromegaly LUNGS:  Clear to auscultation bilaterally BACK:  No CVA tenderness CHEST:  Unremarkable HEART:  PMI not displaced or sustained,S1 and S2 within normal limits, no S3, no S4, no clicks, no rubs, *** murmurs ABD:  Flat, positive bowel sounds normal in frequency in pitch, no bruits, no rebound, no guarding, no midline pulsatile mass, no hepatomegaly, no splenomegaly EXT:   2 plus pulses throughout, no edema, no cyanosis no clubbing   GENERAL:  Well appearing NECK:  No jugular venous distention, waveform within normal limits, carotid upstroke brisk and symmetric, no bruits, no thyromegaly CHEST:  Well healed sternotomy scar LUNGS:  Clear to auscultation bilaterally BACK:  No CVA tenderness CHEST:  Unremarkable HEART:  PMI not displaced or sustained,S1 and S2 within normal limits, no S3, no S4, no clicks, no rubs, no murmurs ABD:  Flat, positive bowel sounds normal in frequency in pitch, no bruits, no rebound, no guarding, no midline pulsatile mass, no hepatomegaly, no splenomegaly EXT:  2 plus  pulses throughout, no edema, no cyanosis no clubbing, trace edema   EKG:  EKG is not *** ordered today. Sinus rhythm, rate ***, axis within normal limits, intervals within normal limits, no acute ST-T wave changes.     Recent Labs: 08/10/2016: Magnesium 2.3 08/20/2016: ALT 28 10/04/2016: BUN 10; Creatinine, Ser 0.63; Hemoglobin 10.2; Platelets 456; Potassium 3.2; Sodium 137    Lipid Panel    Component Value Date/Time   CHOL 120 08/08/2016 0304   TRIG 89 08/08/2016 0304   HDL 35 (L) 08/08/2016 0304   CHOLHDL 3.4 08/08/2016 0304   VLDL 18 08/08/2016 0304   LDLCALC 67 08/08/2016 0304      Wt Readings from Last 3 Encounters:  10/24/16 175 lb (79.4 kg)  10/04/16 172 lb (78 kg)  09/26/16 170 lb (77.1 kg)      Other studies Reviewed: Additional studies/ records that were reviewed today include: ***. Review of the above records demonstrates:  ***    ASSESSMENT AND PLAN:  CAD:   The patient has *** no new sypmtoms.  No further cardiovascular testing is indicated.  We will continue with aggressive risk reduction and meds as listed. Eventually get her into cardiac rehabilitation.  DIABETES:   ***  She needs a PCP to follow this.    EMBOLIC CVA:   *** She is going to continue physical therapy. For now I'll continue aspirin and Plavix.  HTN:    Her blood  pressure is *** not well controlled. I did increase her lisinopril 20 mg daily. She'll take an extra 15 mg today. I'm going to have her come back to see our PA in about a week to consider next med titration which might be increasing her beta blocker.  She can have a basic metabolic profile when she returns for follow-up.   Current medicines are reviewed at length with the patient today.  The patient does not have concerns regarding medicines.  The following changes have been made:  ***  Labs/ tests ordered today include: *** No orders of the defined types were placed in this encounter.    Disposition:   FU with ***.     Signed, Minus Breeding, MD  01/07/2017 8:53 PM    [place]

## 2017-01-08 ENCOUNTER — Ambulatory Visit (HOSPITAL_COMMUNITY): Payer: BLUE CROSS/BLUE SHIELD

## 2017-01-09 ENCOUNTER — Ambulatory Visit: Payer: BLUE CROSS/BLUE SHIELD | Admitting: Cardiology

## 2017-01-10 ENCOUNTER — Ambulatory Visit (HOSPITAL_COMMUNITY): Payer: BLUE CROSS/BLUE SHIELD

## 2017-01-13 ENCOUNTER — Ambulatory Visit (HOSPITAL_COMMUNITY): Payer: BLUE CROSS/BLUE SHIELD

## 2017-01-15 ENCOUNTER — Ambulatory Visit (HOSPITAL_COMMUNITY): Payer: BLUE CROSS/BLUE SHIELD

## 2017-01-17 ENCOUNTER — Ambulatory Visit (HOSPITAL_COMMUNITY): Payer: BLUE CROSS/BLUE SHIELD

## 2017-01-20 ENCOUNTER — Ambulatory Visit (HOSPITAL_COMMUNITY): Payer: BLUE CROSS/BLUE SHIELD

## 2017-01-22 ENCOUNTER — Ambulatory Visit (HOSPITAL_COMMUNITY): Payer: BLUE CROSS/BLUE SHIELD

## 2017-01-24 ENCOUNTER — Ambulatory Visit (HOSPITAL_COMMUNITY): Payer: BLUE CROSS/BLUE SHIELD

## 2017-01-27 ENCOUNTER — Ambulatory Visit (HOSPITAL_COMMUNITY): Payer: BLUE CROSS/BLUE SHIELD

## 2017-01-31 ENCOUNTER — Ambulatory Visit (HOSPITAL_COMMUNITY): Payer: BLUE CROSS/BLUE SHIELD

## 2017-02-02 ENCOUNTER — Emergency Department (HOSPITAL_COMMUNITY)
Admission: EM | Admit: 2017-02-02 | Discharge: 2017-02-02 | Disposition: A | Discharge status: Home / Self Care | Payer: BLUE CROSS/BLUE SHIELD | Attending: Emergency Medicine | Admitting: Emergency Medicine

## 2017-02-02 ENCOUNTER — Encounter (HOSPITAL_COMMUNITY): Payer: Self-pay | Admitting: Emergency Medicine

## 2017-02-02 ENCOUNTER — Emergency Department (HOSPITAL_COMMUNITY): Payer: BLUE CROSS/BLUE SHIELD

## 2017-02-02 DIAGNOSIS — R0789 Other chest pain: Secondary | ICD-10-CM | POA: Diagnosis not present

## 2017-02-02 DIAGNOSIS — J029 Acute pharyngitis, unspecified: Secondary | ICD-10-CM | POA: Diagnosis not present

## 2017-02-02 DIAGNOSIS — Z8673 Personal history of transient ischemic attack (TIA), and cerebral infarction without residual deficits: Secondary | ICD-10-CM | POA: Insufficient documentation

## 2017-02-02 DIAGNOSIS — I2510 Atherosclerotic heart disease of native coronary artery without angina pectoris: Secondary | ICD-10-CM | POA: Insufficient documentation

## 2017-02-02 DIAGNOSIS — E876 Hypokalemia: Secondary | ICD-10-CM | POA: Insufficient documentation

## 2017-02-02 DIAGNOSIS — I252 Old myocardial infarction: Secondary | ICD-10-CM | POA: Diagnosis not present

## 2017-02-02 DIAGNOSIS — R0981 Nasal congestion: Secondary | ICD-10-CM | POA: Insufficient documentation

## 2017-02-02 DIAGNOSIS — Z7982 Long term (current) use of aspirin: Secondary | ICD-10-CM | POA: Insufficient documentation

## 2017-02-02 DIAGNOSIS — Z79899 Other long term (current) drug therapy: Secondary | ICD-10-CM | POA: Insufficient documentation

## 2017-02-02 DIAGNOSIS — I10 Essential (primary) hypertension: Secondary | ICD-10-CM | POA: Diagnosis not present

## 2017-02-02 DIAGNOSIS — Z7902 Long term (current) use of antithrombotics/antiplatelets: Secondary | ICD-10-CM | POA: Diagnosis not present

## 2017-02-02 DIAGNOSIS — Z87891 Personal history of nicotine dependence: Secondary | ICD-10-CM | POA: Insufficient documentation

## 2017-02-02 DIAGNOSIS — Z951 Presence of aortocoronary bypass graft: Secondary | ICD-10-CM | POA: Diagnosis not present

## 2017-02-02 DIAGNOSIS — I158 Other secondary hypertension: Secondary | ICD-10-CM

## 2017-02-02 HISTORY — DX: Atherosclerotic heart disease of native coronary artery without angina pectoris: I25.10

## 2017-02-02 LAB — HEPATIC FUNCTION PANEL
ALBUMIN: 3.4 g/dL — AB (ref 3.5–5.0)
ALK PHOS: 104 U/L (ref 38–126)
ALT: 19 U/L (ref 14–54)
AST: 22 U/L (ref 15–41)
Bilirubin, Direct: 0.1 mg/dL (ref 0.1–0.5)
Indirect Bilirubin: 1.1 mg/dL — ABNORMAL HIGH (ref 0.3–0.9)
TOTAL PROTEIN: 7.7 g/dL (ref 6.5–8.1)
Total Bilirubin: 1.2 mg/dL (ref 0.3–1.2)

## 2017-02-02 LAB — BASIC METABOLIC PANEL
ANION GAP: 7 (ref 5–15)
BUN: 9 mg/dL (ref 6–20)
CO2: 27 mmol/L (ref 22–32)
Calcium: 8.9 mg/dL (ref 8.9–10.3)
Chloride: 102 mmol/L (ref 101–111)
Creatinine, Ser: 0.7 mg/dL (ref 0.44–1.00)
GFR calc Af Amer: >60 mL/min (ref >60)
GFR calc non Af Amer: >60 mL/min (ref >60)
GLUCOSE: 187 mg/dL — AB (ref 65–99)
POTASSIUM: 2.6 mmol/L — AB (ref 3.5–5.1)
Sodium: 136 mmol/L (ref 135–145)

## 2017-02-02 LAB — CBC
HEMATOCRIT: 35.9 % — AB (ref 36.0–46.0)
HEMOGLOBIN: 12.1 g/dL (ref 12.0–15.0)
MCH: 27.9 pg (ref 26.0–34.0)
MCHC: 33.7 g/dL (ref 30.0–36.0)
MCV: 82.9 fL (ref 78.0–100.0)
Platelets: 322 10*3/uL (ref 150–400)
RBC: 4.33 MIL/uL (ref 3.87–5.11)
RDW: 14.2 % (ref 11.5–15.5)
WBC: 6.8 10*3/uL (ref 4.0–10.5)

## 2017-02-02 LAB — I-STAT TROPONIN, ED
Troponin i, poc: 0 ng/mL (ref 0.00–0.08)
Troponin i, poc: 0.01 ng/mL (ref 0.00–0.08)

## 2017-02-02 LAB — LIPASE, BLOOD: Lipase: 27 U/L (ref 11–51)

## 2017-02-02 MED ORDER — POTASSIUM CHLORIDE CRYS ER 10 MEQ PO TBCR: 20.0000 meq | EXTENDED_RELEASE_TABLET | Freq: Every day | ORAL | 0 refills | Status: DC

## 2017-02-02 MED ORDER — POTASSIUM CHLORIDE CRYS ER 20 MEQ PO TBCR
40.0000 meq | EXTENDED_RELEASE_TABLET | Freq: Once | ORAL | Status: AC
  Administered 2017-02-02: 40 meq via ORAL
  Filled 2017-02-02: qty 2

## 2017-02-02 NOTE — ED Notes (Signed)
MD made aware of critical potassium ?

## 2017-02-02 NOTE — ED Provider Notes (Signed)
[facility] DEPT Provider Note   CSN: [id number] Arrival date & time: 02/02/17  0100     History   Chief Complaint Chief Complaint  Patient presents with  . Chest Pain  . Hypertension    HPI Jemimah Cressy is a 46 y.o. female.  The history is provided by the patient.  Chest Pain   This is a new (States that this does not feel like her cardiac chest pain. She states that the sensation in her chest is starting to come back following her open heart surgery, mostly on the right and feels that the pain is likely chest wall pain.) problem. The current episode started 6 to 12 hours ago. Episode frequency: Sporadic. The problem has not changed since onset.The pain is associated with movement. Pain location: right anterior chest. The pain is mild. The quality of the pain is described as sharp and brief. The pain does not radiate. Duration of episode(s) is 3 seconds. Associated symptoms include nausea (for two days) and vomiting (for two days). Pertinent negatives include no cough, no diaphoresis, no fever, no lower extremity edema and no shortness of breath. Treatments tried: changing position. The treatment provided moderate relief. Risk factors include being elderly.  Her past medical history is significant for CAD, diabetes, hypertension, MI (sp CABG in Jan) and strokes.  Hypertension  Associated symptoms include chest pain. Pertinent negatives include no shortness of breath.    States that she was changed from Lisinopril to Amlodipine-Valsartan 2 days ago and since then she has not been feeling well. Complains of sore throat and fatigue. She once and now with her this is an allergic reaction to the medicine.  Reports sick contacts with URI sx, but denies coughing.    Past Medical History:  Diagnosis Date  . Coronary artery disease   . Headache    due to elevated  BP  . History of kidney stones    "passed them"  . Hypertension   . Pancreatic injury    "damage to  pancrease related to gallbladder OR"  . Pneumonia X 1  . Stroke ([place])   . Type II diabetes mellitus [employer])     Patient Active Problem List   Diagnosis Date Noted  . History of stroke 11/01/2016  . Atypical chest pain   . Stroke due to embolism ([place]) 08/19/2016  . Migraine without status migrainosus, not intractable   . Obesity (BMI 30.0-34.9)   . Tachypnea   . Hypoalbuminemia due to protein-calorie malnutrition ([place])   . Leukocytosis   . Acute blood loss anemia   . Pressure injury of skin 08/12/2016  .  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 Essential hypertension 08/07/2016  . Insulin dependent diabetes mellitus with complications ([place]) 71/21/9758  . Non-STEMI (non-ST elevated myocardial infarction) ([place]) 08/07/2016    Past Surgical History:  Procedure Laterality Date  . CARDIAC CATHETERIZATION  08/07/2016  . CARDIAC CATHETERIZATION N/A 08/07/2016   Procedure: Left Heart Cath and Coronary Angiography;  Surgeon: Belva Crome, MD;  Location: [facility];  Service: Cardiovascular;  Laterality: N/A;  . CARDIAC CATHETERIZATION N/A 08/07/2016   Procedure: Intravascular Pressure Wire/FFR Study;  Surgeon: Belva Crome, MD;  Location: [facility];  Service: Cardiovascular;  Laterality: N/A;  . CORONARY ARTERY BYPASS GRAFT N/A 08/09/2016   Procedure: CORONARY ARTERY BYPASS GRAFTING (CABG) x three,SVG to distal circ OM3, SVG to Om1, LIMA to LAD-using left internal mammary artery and right leg greater saphenous vein harvested  endoscopically, Sternal plating - right side;  Surgeon: Grace Isaac, MD;  Location: [facility];  Service: Open Heart Surgery;  Laterality: N/A;  . FACIAL RECONSTRUCTION SURGERY  2009   "facial fractures"  . FRACTURE SURGERY    . LAPAROSCOPIC CHOLECYSTECTOMY    . TEE WITHOUT CARDIOVERSION N/A 08/09/2016   Procedure: TRANSESOPHAGEAL ECHOCARDIOGRAM (TEE);  Surgeon: Grace Isaac, MD;  Location: [facility];  Service: Open Heart Surgery;  Laterality: N/A;  . TUBAL  LIGATION      OB History    No data available       Home Medications    Prior to Admission medications   Medication Sig Start Date End Date Taking? Authorizing Provider  BYSTOLIC 20 MG TABS Take 20 mg by mouth daily. 12/16/16  Yes [provider]  clopidogrel (PLAVIX) 75 MG tablet Take 1 tablet (75 mg total) by mouth daily. 08/23/16  Yes Love, Ivan Anchors, PA-C  acetaminophen (TYLENOL) 500 MG tablet Take 1,000 mg by mouth every 8 (eight) hours as needed.    [provider]  aspirin EC 81 MG tablet Take 81 mg by mouth daily.    [provider]  atorvastatin (LIPITOR) 80 MG tablet Take 1 tablet (80 mg total) by mouth daily at 6 PM. 08/23/16   Love, Ivan Anchors, PA-C  B-D UF III MINI PEN NEEDLES 31G X 5 MM MISC  08/23/16   [provider]  blood glucose meter kit and supplies KIT Dispense based on patient and insurance preference. Use up to four times daily as directed. (FOR ICD-9 250.00, 250.01). 08/23/16   Love, Ivan Anchors, PA-C  carisoprodol (SOMA) 350 MG tablet Take 350 mg by mouth at bedtime as needed.  10/17/16   [provider]  cloNIDine (CATAPRES) 0.2 MG tablet Take 0.2 mg by mouth daily.    [provider]  furosemide (LASIX) 40 MG tablet Take 1 tablet (40 mg total) by mouth daily. 08/23/16 08/30/16  Bary Leriche, PA-C  Insulin Detemir (LEVEMIR) 100 UNIT/ML Pen Inject 30 Units into the skin daily after breakfast. Patient taking differently: Inject 30 Units into the skin daily at 10 pm.  08/23/16   Love, Ivan Anchors, PA-C  Insulin Pen Needle (CLICKFINE PEN NEEDLES) 31G X 8 MM MISC 1 application by Does not apply route as directed. 08/23/16   Love, Ivan Anchors, PA-C  lisinopril (PRINIVIL,ZESTRIL) 20 MG tablet Take 1 tablet (20 mg total) by mouth daily. 09/12/16 12/11/16  Minus Breeding, MD  metoprolol tartrate (LOPRESSOR) 25 MG tablet Take 1 tablet (25 mg total) by mouth 2 (two) times daily. 08/23/16   Love, Ivan Anchors, PA-C  nitroGLYCERIN (NITROSTAT) 0.4  MG SL tablet Place 1 tablet (0.4 mg total) under the tongue every 5 (five) minutes as needed for chest pain. Max 3 doses--call MD if you need to use 2 or more pills 08/23/16   Love, Ivan Anchors, PA-C  NOVOLOG 100 UNIT/ML injection Takes as a Sliding Scale prn patient states she is not sure how much insulin she takes 08/19/16   [provider]  [facility] VERIO test strip  08/23/16   [provider]  pantoprazole (PROTONIX) 40 MG tablet Take 1 tablet (40 mg total) by mouth daily before breakfast. 08/24/16   Love, Ivan Anchors, PA-C  potassium chloride (K-DUR,KLOR-CON) 10 MEQ tablet Take 2 tablets (20 mEq total) by mouth daily. 02/02/17 03/04/17  Fatima Blank, MD  temazepam (RESTORIL) 15 MG capsule Take 15 mg by mouth at bedtime  as needed for sleep.     [provider]  traMADol (ULTRAM) 50 MG tablet Take 1 tablet (50 mg total) by mouth every 12 (twelve) hours as needed for moderate pain. Patient taking differently: Take 50 mg by mouth every 12 (twelve) hours as needed for moderate pain.  10/04/16   Domenic Moras, PA-C    Family History Family History  Problem Relation Age of Onset  . Hypertension Mother   . Diabetes Mother   . Hypertension Father   . Diabetes Father   . Heart attack Father 23       had CABG  . Prostate cancer Father   . Stomach cancer Maternal Grandmother   . Diabetes Paternal Grandmother        had L amputation  . Brain cancer Paternal Grandfather     Social History Social History  Substance Use Topics  . Smoking status: Former Smoker    Packs/day: 0.50    Years: 20.00    Types: Cigarettes  . Smokeless tobacco: Never Used  . Alcohol use No     Allergies   Eugenol; Hydrochlorothiazide; Morphine and related; Percocet [oxycodone-acetaminophen]; Shellfish allergy; Sulfa antibiotics; Tomato; and Nicotine   Review of Systems Review of Systems  Constitutional: Negative for diaphoresis and fever.  Respiratory: Negative for cough and shortness of  breath.   Cardiovascular: Positive for chest pain.  Gastrointestinal: Positive for nausea (for two days) and vomiting (for two days).  All other systems are reviewed and are negative for acute change except as noted in the HPI    Physical Exam Updated Vital Signs BP (!) 184/102   Pulse 67   Temp 98.8 F (37.1 C) (Oral)   Resp 19   LMP 01/27/2017 (Approximate)   SpO2 98%   Physical Exam  Constitutional: She is oriented to person, place, and time. She appears well-developed and well-nourished. No distress.  HENT:  Head: Normocephalic and atraumatic.  Nose: Mucosal edema present.  Mouth/Throat: Mucous membranes are normal. Posterior oropharyngeal erythema (mild irritation) present. No oropharyngeal exudate, posterior oropharyngeal edema or tonsillar abscesses. No tonsillar exudate.  Post nasal drip  Eyes: Conjunctivae and EOM are normal. Pupils are equal, round, and reactive to light. Right eye exhibits no discharge. Left eye exhibits no discharge. No scleral icterus.  Neck: Normal range of motion. Neck supple.  Cardiovascular: Normal rate and regular rhythm.  Exam reveals no gallop and no friction rub.   No murmur heard. Pulmonary/Chest: Effort normal and breath sounds normal. No stridor. No respiratory distress. She has no rales.  Sternotomy scar  Abdominal: Soft. She exhibits no distension. There is no tenderness.  Musculoskeletal: She exhibits no edema or tenderness.  Neurological: She is alert and oriented to person, place, and time.  Skin: Skin is warm and dry. No rash noted. She is not diaphoretic. No erythema.  Psychiatric: She has a normal mood and affect.  Vitals reviewed.    ED Treatments / Results  Labs (all labs ordered are listed, but only abnormal results are displayed) Labs Reviewed  BASIC METABOLIC PANEL - Abnormal; Notable for the following:       Result Value   Potassium 2.6 (*)    Glucose, Bld 187 (*)    All other components within normal limits  CBC -  Abnormal; Notable for the following:    HCT 35.9 (*)    All other components within normal limits  HEPATIC FUNCTION PANEL - Abnormal; Notable for the following:    Albumin 3.4 (*)  Indirect Bilirubin 1.1 (*)    All other components within normal limits  LIPASE, BLOOD  I-STAT TROPOININ, ED  I-STAT TROPOININ, ED    EKG  EKG Interpretation  Date/Time:  Sunday February 02 2017 06:19:58 EDT Ventricular Rate:  63 PR Interval:  136 QRS Duration: 82 QT Interval:  396 QTC Calculation: 405 R Axis:   58 Text Interpretation:  Normal sinus rhythm Biatrial enlargement ST & T wave abnormality, consider inferolateral ischemia Abnormal ECG similar to previous No significant change was found Confirmed by Ezequiel Essex [phone]) on 02/02/2017 6:47:26 AM       Radiology Dg Chest 2 View  Result Date: 02/02/2017 CLINICAL DATA:  Central chest pain that radiates to right side of chest. Shortness of breath. EXAM: CHEST  2 VIEW COMPARISON:  10/04/2016 FINDINGS: The lungs are clear without focal pneumonia, edema, pneumothorax or pleural effusion. The cardiopericardial silhouette is within normal limits for size. Patient is status post CABG. The visualized bony structures of the thorax are intact. Telemetry leads overlie the chest. IMPRESSION: No active cardiopulmonary disease. Electronically Signed   By: Misty Stanley M.D.   On: 02/02/2017 07:16    Procedures Procedures (including critical care time)  Medications Ordered in ED Medications  potassium chloride SA (K-DUR,KLOR-CON) CR tablet 40 mEq (40 mEq Oral Given 02/02/17 8938)     Initial Impression / Assessment and Plan / ED Course  I have reviewed the triage vital signs and the nursing notes.  Pertinent labs & imaging results that were available during my care of the patient were reviewed by me and considered in my medical decision making (see chart for details).     1. Chest pain Most consistent with chest wall pain. Highly atypical for ACS. EKG  without acute ischemic changes or evidence of pericarditis. The patient does have extensive cardiac history with a recent MI status post CABG. However given the fact that her pain is highly consistent with ACS I feel that it delta troponin would be sufficient to rule out ACS at this time given the duration of her pain..  Chest x-ray without evidence suggestive of pneumonia, pneumothorax, pneumomediastinum.  No abnormal contour of the mediastinum to suggest dissection. No evidence of acute injuries.  Low pretest probability for pulmonary embolism. Presentation is classic for aortic dissection or esophageal perforation.  Initial trop negative. Will await delta trop.   10:27 AM Delta trop negative.  Mild hyperkalemia noted to 2.6. May be contributing to muscle spasms. We'll replete orally.   2. Sore throat No evidence of angioedema on exam. Patient does have mild posterior oropharynx irritation with postnasal drip and nasal congestion. Feel her sore throat secondary to the postnasal drip/URI symptoms. Low suspicion for allergic reaction.   3. HTN Other than sporadic chest wall pain, patient is asymptomatic. Labs without evidence of end organ damage. Improved without intervention here.   Vitals:   02/02/17 0945  BP: (!) 160/81  Pulse: 65  Resp: 13  Temp:   TempSrc:   SpO2: 98%    The patient is safe for discharge with strict return precautions.  Final Clinical Impressions(s) / ED Diagnoses   Final diagnoses:  Atypical chest pain  Hypokalemia  Sore throat  Nasal congestion  Other secondary hypertension   Disposition: Discharge  Condition: Good  I have discussed the results, Dx and Tx plan with the patient who expressed understanding and agree(s) with the plan. Discharge instructions discussed at great length. The patient was given strict return precautions who verbalized  understanding of the instructions. No further questions at time of discharge.    Current Discharge  Medication List      Follow Up: Lin Landsman, [address] [place] [id number] [phone]  Schedule an appointment as soon as possible for a visit in 2 weeks reassess potassium level  Adrian Prows, MD [address] [place] [address] [phone]  Call  As needed for medication adjustment      Leonette Monarch Grayce Sessions, MD 02/02/17 1028

## 2017-02-02 NOTE — ED Triage Notes (Addendum)
Patient reports central chest pain with neck pressure , SOB and occasional dry cough onset this morning  , denies emesis or diaphoresis , history of CAD/CABG her cardiologist is Dr. Lennox SoldersGhanji. Pt. added elevated blood pressure at home this morning 213/114.

## 2017-02-02 NOTE — ED Notes (Signed)
EDP at bedside  

## 2017-02-02 NOTE — ED Notes (Signed)
Pt. Upset that she asked phlebotomy to get a RN to take her to the restroom. She then got up and walked herself to restroom. RN walked patient back from restroom and apologized for her wait. Pt. Sitting up in bed. Pt. Still c/o throat pain. Pt. Asked RN to take IV out.

## 2017-02-03 ENCOUNTER — Ambulatory Visit (HOSPITAL_COMMUNITY): Payer: BLUE CROSS/BLUE SHIELD

## 2017-02-05 ENCOUNTER — Ambulatory Visit (HOSPITAL_COMMUNITY): Payer: BLUE CROSS/BLUE SHIELD

## 2017-02-07 ENCOUNTER — Ambulatory Visit (HOSPITAL_COMMUNITY): Payer: BLUE CROSS/BLUE SHIELD

## 2017-02-10 ENCOUNTER — Ambulatory Visit (HOSPITAL_COMMUNITY): Payer: BLUE CROSS/BLUE SHIELD

## 2017-02-12 ENCOUNTER — Ambulatory Visit (HOSPITAL_COMMUNITY): Payer: BLUE CROSS/BLUE SHIELD

## 2017-02-14 ENCOUNTER — Ambulatory Visit (HOSPITAL_COMMUNITY): Payer: BLUE CROSS/BLUE SHIELD

## 2017-02-17 ENCOUNTER — Ambulatory Visit (HOSPITAL_COMMUNITY): Payer: BLUE CROSS/BLUE SHIELD

## 2017-02-19 ENCOUNTER — Ambulatory Visit (HOSPITAL_COMMUNITY): Payer: BLUE CROSS/BLUE SHIELD

## 2017-02-21 ENCOUNTER — Ambulatory Visit (HOSPITAL_COMMUNITY): Payer: BLUE CROSS/BLUE SHIELD

## 2017-02-24 ENCOUNTER — Ambulatory Visit (HOSPITAL_COMMUNITY): Payer: BLUE CROSS/BLUE SHIELD

## 2017-02-24 ENCOUNTER — Ambulatory Visit: Payer: BLUE CROSS/BLUE SHIELD | Admitting: Nurse Practitioner

## 2017-02-26 ENCOUNTER — Ambulatory Visit (HOSPITAL_COMMUNITY): Payer: BLUE CROSS/BLUE SHIELD

## 2017-02-28 ENCOUNTER — Ambulatory Visit (HOSPITAL_COMMUNITY): Payer: BLUE CROSS/BLUE SHIELD

## 2017-03-03 ENCOUNTER — Ambulatory Visit (HOSPITAL_COMMUNITY): Payer: BLUE CROSS/BLUE SHIELD

## 2017-04-09 ENCOUNTER — Encounter: Payer: Self-pay | Admitting: Physical Therapy

## 2017-06-19 ENCOUNTER — Emergency Department (HOSPITAL_COMMUNITY): Payer: BLUE CROSS/BLUE SHIELD

## 2017-06-19 ENCOUNTER — Other Ambulatory Visit: Payer: Self-pay

## 2017-06-19 ENCOUNTER — Emergency Department (HOSPITAL_COMMUNITY)
Admission: EM | Admit: 2017-06-19 | Discharge: 2017-06-19 | Disposition: A | Discharge status: Home / Self Care | Payer: BLUE CROSS/BLUE SHIELD | Attending: Emergency Medicine | Admitting: Emergency Medicine

## 2017-06-19 ENCOUNTER — Encounter (HOSPITAL_COMMUNITY): Payer: Self-pay

## 2017-06-19 DIAGNOSIS — Z951 Presence of aortocoronary bypass graft: Secondary | ICD-10-CM | POA: Diagnosis not present

## 2017-06-19 DIAGNOSIS — R112 Nausea with vomiting, unspecified: Secondary | ICD-10-CM | POA: Diagnosis present

## 2017-06-19 DIAGNOSIS — R1114 Bilious vomiting: Secondary | ICD-10-CM

## 2017-06-19 DIAGNOSIS — I252 Old myocardial infarction: Secondary | ICD-10-CM | POA: Diagnosis not present

## 2017-06-19 DIAGNOSIS — Z794 Long term (current) use of insulin: Secondary | ICD-10-CM | POA: Diagnosis not present

## 2017-06-19 DIAGNOSIS — Z79899 Other long term (current) drug therapy: Secondary | ICD-10-CM | POA: Insufficient documentation

## 2017-06-19 DIAGNOSIS — Z87891 Personal history of nicotine dependence: Secondary | ICD-10-CM | POA: Insufficient documentation

## 2017-06-19 DIAGNOSIS — R51 Headache: Secondary | ICD-10-CM | POA: Insufficient documentation

## 2017-06-19 DIAGNOSIS — Z7982 Long term (current) use of aspirin: Secondary | ICD-10-CM | POA: Diagnosis not present

## 2017-06-19 DIAGNOSIS — I10 Essential (primary) hypertension: Secondary | ICD-10-CM | POA: Diagnosis not present

## 2017-06-19 DIAGNOSIS — E119 Type 2 diabetes mellitus without complications: Secondary | ICD-10-CM | POA: Diagnosis not present

## 2017-06-19 DIAGNOSIS — Z7901 Long term (current) use of anticoagulants: Secondary | ICD-10-CM | POA: Diagnosis not present

## 2017-06-19 DIAGNOSIS — Z8673 Personal history of transient ischemic attack (TIA), and cerebral infarction without residual deficits: Secondary | ICD-10-CM | POA: Diagnosis not present

## 2017-06-19 LAB — I-STAT VENOUS BLOOD GAS, ED
ACID-BASE DEFICIT: 2 mmol/L (ref 0.0–2.0)
Bicarbonate: 19.1 mmol/L — ABNORMAL LOW (ref 20.0–28.0)
O2 SAT: 90 %
PH VEN: 7.519 — AB (ref 7.250–7.430)
TCO2: 20 mmol/L — ABNORMAL LOW (ref 22–32)
pCO2, Ven: 23.5 mmHg — ABNORMAL LOW (ref 44.0–60.0)
pO2, Ven: 51 mmHg — ABNORMAL HIGH (ref 32.0–45.0)

## 2017-06-19 LAB — URINALYSIS, ROUTINE W REFLEX MICROSCOPIC
Bilirubin Urine: NEGATIVE
Glucose, UA: NEGATIVE mg/dL
HGB URINE DIPSTICK: NEGATIVE
Ketones, ur: 20 mg/dL — AB
NITRITE: NEGATIVE
Protein, ur: 100 mg/dL — AB
SPECIFIC GRAVITY, URINE: 1.012 (ref 1.005–1.030)
pH: 8 (ref 5.0–8.0)

## 2017-06-19 LAB — CBC WITH DIFFERENTIAL/PLATELET
Basophils Absolute: 0 10*3/uL (ref 0.0–0.1)
Basophils Relative: 0 %
EOS ABS: 0.1 10*3/uL (ref 0.0–0.7)
EOS PCT: 1 %
HCT: 40.1 % (ref 36.0–46.0)
Hemoglobin: 13.7 g/dL (ref 12.0–15.0)
Lymphocytes Relative: 29 %
Lymphs Abs: 3.9 10*3/uL (ref 0.7–4.0)
MCH: 28.8 pg (ref 26.0–34.0)
MCHC: 34.2 g/dL (ref 30.0–36.0)
MCV: 84.4 fL (ref 78.0–100.0)
MONO ABS: 0.7 10*3/uL (ref 0.1–1.0)
MONOS PCT: 5 %
Neutro Abs: 8.8 10*3/uL — ABNORMAL HIGH (ref 1.7–7.7)
Neutrophils Relative %: 65 %
PLATELETS: 363 10*3/uL (ref 150–400)
RBC: 4.75 MIL/uL (ref 3.87–5.11)
RDW: 15.3 % (ref 11.5–15.5)
WBC: 13.6 10*3/uL — AB (ref 4.0–10.5)

## 2017-06-19 LAB — COMPREHENSIVE METABOLIC PANEL
ALT: 18 U/L (ref 14–54)
AST: 25 U/L (ref 15–41)
Albumin: 4.4 g/dL (ref 3.5–5.0)
Alkaline Phosphatase: 133 U/L — ABNORMAL HIGH (ref 38–126)
Anion gap: 13 (ref 5–15)
BILIRUBIN TOTAL: 1.6 mg/dL — AB (ref 0.3–1.2)
BUN: 10 mg/dL (ref 6–20)
CHLORIDE: 102 mmol/L (ref 101–111)
CO2: 19 mmol/L — ABNORMAL LOW (ref 22–32)
CREATININE: 0.71 mg/dL (ref 0.44–1.00)
Calcium: 9.5 mg/dL (ref 8.9–10.3)
GFR calc Af Amer: >60 mL/min (ref >60)
Glucose, Bld: 161 mg/dL — ABNORMAL HIGH (ref 65–99)
Potassium: 3.5 mmol/L (ref 3.5–5.1)
Sodium: 134 mmol/L — ABNORMAL LOW (ref 135–145)
Total Protein: 8.5 g/dL — ABNORMAL HIGH (ref 6.5–8.1)

## 2017-06-19 LAB — I-STAT CG4 LACTIC ACID, ED
LACTIC ACID, VENOUS: 3.16 mmol/L — AB (ref 0.5–1.9)
LACTIC ACID, VENOUS: 0.85 mmol/L (ref 0.5–1.9)

## 2017-06-19 LAB — I-STAT BETA HCG BLOOD, ED (MC, WL, AP ONLY)

## 2017-06-19 MED ORDER — LABETALOL HCL 5 MG/ML IV SOLN
10.0000 mg | Freq: Once | INTRAVENOUS | Status: AC
  Administered 2017-06-19: 10 mg via INTRAVENOUS
  Filled 2017-06-19: qty 4

## 2017-06-19 MED ORDER — ONDANSETRON HCL 4 MG/2ML IJ SOLN
4.0000 mg | Freq: Once | INTRAMUSCULAR | Status: AC
  Administered 2017-06-19: 4 mg via INTRAVENOUS
  Filled 2017-06-19: qty 2

## 2017-06-19 MED ORDER — ONDANSETRON 4 MG PO TBDP: 4.0000 mg | ORAL_TABLET | Freq: Three times a day (TID) | ORAL | 0 refills | Status: DC | PRN

## 2017-06-19 MED ORDER — SODIUM CHLORIDE 0.9 % IV BOLUS (SEPSIS)
1000.0000 mL | Freq: Once | INTRAVENOUS | Status: AC
Start: 2017-06-19 — End: 2017-06-19
  Administered 2017-06-19: 1000 mL via INTRAVENOUS

## 2017-06-19 MED ORDER — PROCHLORPERAZINE EDISYLATE 5 MG/ML IJ SOLN
10.0000 mg | Freq: Once | INTRAMUSCULAR | Status: AC
  Administered 2017-06-19: 10 mg via INTRAVENOUS
  Filled 2017-06-19: qty 2

## 2017-06-19 MED ORDER — KETOROLAC TROMETHAMINE 30 MG/ML IJ SOLN
15.0000 mg | Freq: Once | INTRAMUSCULAR | Status: AC
  Administered 2017-06-19: 15 mg via INTRAVENOUS
  Filled 2017-06-19: qty 1

## 2017-06-19 MED ORDER — SODIUM CHLORIDE 0.9 % IV BOLUS (SEPSIS)
1000.0000 mL | Freq: Once | INTRAVENOUS | Status: AC
  Administered 2017-06-19: 1000 mL via INTRAVENOUS

## 2017-06-19 MED ORDER — LORAZEPAM 2 MG/ML IJ SOLN
1.0000 mg | Freq: Once | INTRAMUSCULAR | Status: AC
  Administered 2017-06-19: 1 mg via INTRAVENOUS
  Filled 2017-06-19: qty 1

## 2017-06-19 NOTE — ED Notes (Signed)
Husband at bedside.  

## 2017-06-19 NOTE — Discharge Instructions (Signed)
As discussed, your evaluation today has been largely reassuring.  But, it is important that you monitor your condition carefully, and do not hesitate to return to the ED if you develop new, or concerning changes in your condition. ? ?Otherwise, please follow-up with your physician for appropriate ongoing care. ? ?

## 2017-06-19 NOTE — ED Provider Notes (Signed)
[facility] EMERGENCY DEPARTMENT Provider Note   CSN: [id number] Arrival date & time: 06/19/17  1146     History   Chief Complaint Chief Complaint  Patient presents with  . Emesis    HPI [name] is a 46 y.o. female.  HPI  Patient presents with concern of nausea, vomiting, generalized discomfort. Patient acknowledges multiple medical issues, including insulin-dependent diabetes, prior cardiac procedure She states that she was well until yesterday. Over the past day or so she has had persistent nausea, anorexia, and multiple episodes of vomiting. The disease has been severe, and she is currently incapable of taking any oral intake. Patient was intolerant of her a.m. medications today as well. She is here with her husband who is she denies chest pain, abdominal pain, states that she has some soreness in a post area on the left lateral rib cage, otherwise nothing remarkable. However, she does have ongoing headache. No confusion, no disorientation, no weakness in any extremity.  Past Medical History:  Diagnosis Date  . Coronary artery disease   . Headache    due to elevated  BP  . History of kidney stones    "passed them"  . Hypertension   . Pancreatic injury    "damage to pancrease related to gallbladder OR"  . Pneumonia X 1  . Stroke ([place])   . Type II diabetes mellitus [facility])     Patient Active Problem List   Diagnosis Date Noted  . History of stroke 11/01/2016  . Atypical chest pain   . Stroke due to embolism ([place]) 08/19/2016  . Migraine without status migrainosus, not intractable   . Obesity (BMI 30.0-34.9)   . Tachypnea   . Hypoalbuminemia due to protein-calorie malnutrition ([place])   . Leukocytosis   . Acute blood loss anemia   . Pressure injury of skin 08/12/2016  .  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 Essential hypertension 08/07/2016  . Insulin dependent diabetes mellitus with complications ([place]) 16/01/3709  . Non-STEMI  (non-ST elevated myocardial infarction) ([place]) 08/07/2016    Past Surgical History:  Procedure Laterality Date  . CARDIAC CATHETERIZATION  08/07/2016  . CARDIAC CATHETERIZATION N/A 08/07/2016   Procedure: Left Heart Cath and Coronary Angiography;  Surgeon: Belva Crome, MD;  Location: [facility] CV LAB;  Service: Cardiovascular;  Laterality: N/A;  . CARDIAC CATHETERIZATION N/A 08/07/2016   Procedure: Intravascular Pressure Wire/FFR Study;  Surgeon: Belva Crome, MD;  Location: [facility] CV [facility];  Service: Cardiovascular;  Laterality: N/A;  . CORONARY ARTERY BYPASS GRAFT N/A 08/09/2016   Procedure: CORONARY ARTERY BYPASS GRAFTING (CABG) x three,SVG to distal circ OM3, SVG to Om1, LIMA to LAD-using left internal mammary artery and right leg greater saphenous vein harvested endoscopically, Sternal plating - right side;  Surgeon: Grace Isaac, MD;  Location: [facility];  Service: Open Heart Surgery;  Laterality: N/A;  . FACIAL RECONSTRUCTION SURGERY  2009   "facial fractures"  . FRACTURE SURGERY    . LAPAROSCOPIC CHOLECYSTECTOMY    . TEE WITHOUT CARDIOVERSION N/A 08/09/2016   Procedure: TRANSESOPHAGEAL ECHOCARDIOGRAM (TEE);  Surgeon: Grace Isaac, MD;  Location: [facility];  Service: Open Heart Surgery;  Laterality: N/A;  . TUBAL LIGATION      OB History    No data available       Home Medications    Prior to Admission medications   Medication Sig Start Date End Date Taking? Authorizing Provider  acetaminophen (TYLENOL) 500 MG tablet Take  1,000 mg by mouth every 8 (eight) hours as needed.    [provider]  aspirin EC 81 MG tablet Take 81 mg by mouth daily.    [provider]  atorvastatin (LIPITOR) 80 MG tablet Take 1 tablet (80 mg total) by mouth daily at 6 PM. 08/23/16   Love, Ivan Anchors, PA-C  B-D UF III MINI PEN NEEDLES 31G X 5 MM MISC  08/23/16   [provider]  blood glucose meter kit and supplies KIT Dispense based on patient and insurance preference.  Use up to four times daily as directed. (FOR ICD-9 250.00, 250.01). 08/23/16   Love, Ivan Anchors, PA-C  BYSTOLIC 20 MG TABS Take 20 mg by mouth daily. 12/16/16   [provider]  carisoprodol (SOMA) 350 MG tablet Take 350 mg by mouth at bedtime as needed.  10/17/16   [provider]  cloNIDine (CATAPRES) 0.2 MG tablet Take 0.2 mg by mouth daily.    [provider]  clopidogrel (PLAVIX) 75 MG tablet Take 1 tablet (75 mg total) by mouth daily. 08/23/16   Love, Ivan Anchors, PA-C  furosemide (LASIX) 40 MG tablet Take 1 tablet (40 mg total) by mouth daily. 08/23/16 08/30/16  Bary Leriche, PA-C  Insulin Detemir (LEVEMIR) 100 UNIT/ML Pen Inject 30 Units into the skin daily after breakfast. Patient taking differently: Inject 30 Units into the skin daily at 10 pm.  08/23/16   Love, Ivan Anchors, PA-C  Insulin Pen Needle (CLICKFINE PEN NEEDLES) 31G X 8 MM MISC 1 application by Does not apply route as directed. 08/23/16   Love, Ivan Anchors, PA-C  lisinopril (PRINIVIL,ZESTRIL) 20 MG tablet Take 1 tablet (20 mg total) by mouth daily. 09/12/16 12/11/16  Minus Breeding, MD  metoprolol tartrate (LOPRESSOR) 25 MG tablet Take 1 tablet (25 mg total) by mouth 2 (two) times daily. 08/23/16   Love, Ivan Anchors, PA-C  nitroGLYCERIN (NITROSTAT) 0.4 MG SL tablet Place 1 tablet (0.4 mg total) under the tongue every 5 (five) minutes as needed for chest pain. Max 3 doses--call MD if you need to use 2 or more pills 08/23/16   Love, Ivan Anchors, PA-C  NOVOLOG 100 UNIT/ML injection Takes as a Sliding Scale prn patient states she is not sure how much insulin she takes 08/19/16   [provider]  [facility] VERIO test strip  08/23/16   [provider]  pantoprazole (PROTONIX) 40 MG tablet Take 1 tablet (40 mg total) by mouth daily before breakfast. 08/24/16   Love, Ivan Anchors, PA-C  potassium chloride (K-DUR,KLOR-CON) 10 MEQ tablet Take 2 tablets (20 mEq total) by mouth daily. 02/02/17 03/04/17  Fatima Blank, MD    temazepam (RESTORIL) 15 MG capsule Take 15 mg by mouth at bedtime as needed for sleep.     [provider]  traMADol (ULTRAM) 50 MG tablet Take 1 tablet (50 mg total) by mouth every 12 (twelve) hours as needed for moderate pain. Patient taking differently: Take 50 mg by mouth every 12 (twelve) hours as needed for moderate pain.  10/04/16   Domenic Moras, PA-C    Family History Family History  Problem Relation Age of Onset  . Hypertension Mother   . Diabetes Mother   . Hypertension Father   . Diabetes Father   . Heart attack Father 62       had CABG  . Prostate cancer Father   . Stomach cancer Maternal Grandmother   . Diabetes Paternal Grandmother  had L amputation  . Brain cancer Paternal Grandfather     Social History Social History   Tobacco Use  . Smoking status: Former Smoker    Packs/day: 0.50    Years: 20.00    Pack years: 10.00    Types: Cigarettes  . Smokeless tobacco: Never Used  Substance Use Topics  . Alcohol use: No  . Drug use: No     Allergies   Eugenol; Hydrochlorothiazide; Morphine and related; Percocet [oxycodone-acetaminophen]; Shellfish allergy; Sulfa antibiotics; Tomato; and Nicotine   Review of Systems Review of Systems  Constitutional:       Per HPI, otherwise negative  HENT:       Per HPI, otherwise negative  Respiratory:       Per HPI, otherwise negative  Cardiovascular:       Per HPI, otherwise negative  Gastrointestinal: Positive for nausea and vomiting.  Endocrine:       Negative aside from HPI  Genitourinary:       Neg aside from HPI   Musculoskeletal:       Per HPI, otherwise negative  Skin: Negative.   Neurological: Positive for headaches. Negative for syncope.     Physical Exam Updated Vital Signs BP (!) 200/108   Pulse 95   Temp 97.9 F (36.6 C) (Oral)   Resp (!) 24   Ht _0  (1.499 m)   SpO2 100%   BMI 35.35 kg/m   Physical Exam  Constitutional: She is oriented to person, place, and time. She  appears well-developed and well-nourished.  Very uncomfortable appearing female actively vomiting.  HENT:  Head: Normocephalic and atraumatic.  Eyes: Conjunctivae and EOM are normal.  Cardiovascular: Regular rhythm. Tachycardia present.  Pulmonary/Chest: Effort normal and breath sounds normal. No stridor. Tachypnea noted. No respiratory distress.    Abdominal: She exhibits no distension. There is no tenderness. There is no rigidity and no guarding.  Musculoskeletal: She exhibits no edema.  Neurological: She is alert and oriented to person, place, and time. She displays no atrophy and no tremor. No cranial nerve deficit. She exhibits normal muscle tone. She displays no seizure activity.  Skin: Skin is warm and dry.  Psychiatric: She has a normal mood and affect.  Nursing note and vitals reviewed.    ED Treatments / Results  Labs (all labs ordered are listed, but only abnormal results are displayed) Labs Reviewed  COMPREHENSIVE METABOLIC PANEL - Abnormal; Notable for the following components:      Result Value   Sodium 134 (*)    CO2 19 (*)    Glucose, Bld 161 (*)    Total Protein 8.5 (*)    Alkaline Phosphatase 133 (*)    Total Bilirubin 1.6 (*)    All other components within normal limits  CBC WITH DIFFERENTIAL/PLATELET - Abnormal; Notable for the following components:   WBC 13.6 (*)    Neutro Abs 8.8 (*)    All other components within normal limits  I-STAT VENOUS BLOOD GAS, ED - Abnormal; Notable for the following components:   pH, Ven 7.519 (*)    pCO2, Ven 23.5 (*)    pO2, Ven 51.0 (*)    Bicarbonate 19.1 (*)    TCO2 20 (*)    All other components within normal limits  I-STAT CG4 LACTIC ACID, ED - Abnormal; Notable for the following components:   Lactic Acid, Venous 3.16 (*)    All other components within normal limits  URINALYSIS, ROUTINE W REFLEX MICROSCOPIC  I-STAT BETA  HCG BLOOD, ED ([facility], [facility], [facility] ONLY)  I-STAT CG4 LACTIC ACID, ED    EKG  EKG  Interpretation  Date/Time:  Thursday June 19 2017 11:54:00 EST Ventricular Rate:  97 PR Interval:    QRS Duration: 83 QT Interval:  350 QTC Calculation: 445 R Axis:   76 Text Interpretation:  Sinus rhythm Biatrial enlargement Probable left ventricular hypertrophy Baseline wander in lead(s) I II III aVR aVL aVF V1 V2 V3 V4 V5 V6 Abnormal ekg Confirmed by Carmin Muskrat [phone]) on 06/19/2017 12:22:32 PM       Radiology Ct Head Wo Contrast  Result Date: 06/19/2017 CLINICAL DATA:  Headache since 1 a.m. EXAM: CT HEAD WITHOUT CONTRAST TECHNIQUE: Contiguous axial images were obtained from the base of the skull through the vertex without intravenous contrast. COMPARISON:  10/30/2016 and priors.  MRI 05/28/2014. FINDINGS: Brain: Chronic minimal small vessel ischemic changes of periventricular white matter are noted. No large vascular territory infarction, hemorrhage or midline shift. No hydrocephalus. No intra-axial mass nor extra-axial fluid collections. Vascular: Minimal atherosclerosis of the carotid siphons. No unexpected calcifications. Skull: No acute fracture or suspicious osseous lesions. Clear mastoids. Sinuses/Orbits: No acute finding. Other: Negative IMPRESSION: 1. Chronic minimal small vessel ischemic disease of periventricular white matter. 2. No acute intracranial abnormality. Electronically Signed   By: Ashley Royalty M.D.   On: 06/19/2017 13:52    Procedures Procedures (including critical care time)  Medications Ordered in ED Medications  ondansetron (ZOFRAN) injection 4 mg (4 mg Intravenous Given 06/19/17 1203)  sodium chloride 0.9 % bolus 1,000 mL (1,000 mLs Intravenous New Bag/Given 06/19/17 1203)     Initial Impression / Assessment and Plan / ED Course  I have reviewed the triage vital signs and the nursing notes.  Pertinent labs & imaging results that were available during my care of the patient were reviewed by me and considered in my medical decision making (see  chart for details).  After initial evaluation with concern for ongoing tachypnea, tachycardia, vomiting, patient received fluids, Zofran, and was suspicion of DKA versus other metabolic or infectious pathology, the patient had brought labs sent. After the patient's vomiting episode improved, her tachypnea, tachycardia, both improved.  1:27 PM Patient now sleeping following IVF, zofran, ativan  BP 170/90, HR 70's.  2:17 PM Patient calm, no additional vomiting, though she continues to complain of headache. Vital signs remain reassuring aside from hypertension. Patient was unable to take her blood pressure medication this morning, will be provided IV equivalent of her beta-blocker in addition to medication for headache.   3:10 PM Patient now resting, states that her headache is better, no additional vomiting. Second lactic acid value is normal. Vital signs remain improved. She, her husband and I discussed today's presentation, and given her improvement, with no additional vomiting, return to normal of lactic acidosis, no evidence for hyperglycemia, DKA, mild ketones noted, patient was discharged in stable condition to follow-up with primary care. Final Clinical Impressions(s) / ED Diagnoses   Final diagnoses:  Bilious vomiting with nausea    ED Discharge Orders        Ordered    ondansetron (ZOFRAN ODT) 4 MG disintegrating tablet  Every 8 hours PRN     06/19/17 1509       Carmin Muskrat, MD 06/19/17 1510

## 2017-06-19 NOTE — ED Triage Notes (Signed)
Pt from home with sudden onset of vomiting last night. Pt c/o of headache

## 2017-06-19 NOTE — ED Notes (Signed)
ED Provider at bedside. 

## 2017-11-07 IMAGING — DX DG CHEST 2V
2 series · 2 of 2 positions shown · non-contrast
Comparison: Portable chest x-ray August 15, 2016

CLINICAL DATA: Post- operative pneumonia, status post CABG eleven
days ago

EXAM:
CHEST  2 VIEW

[chest pa]
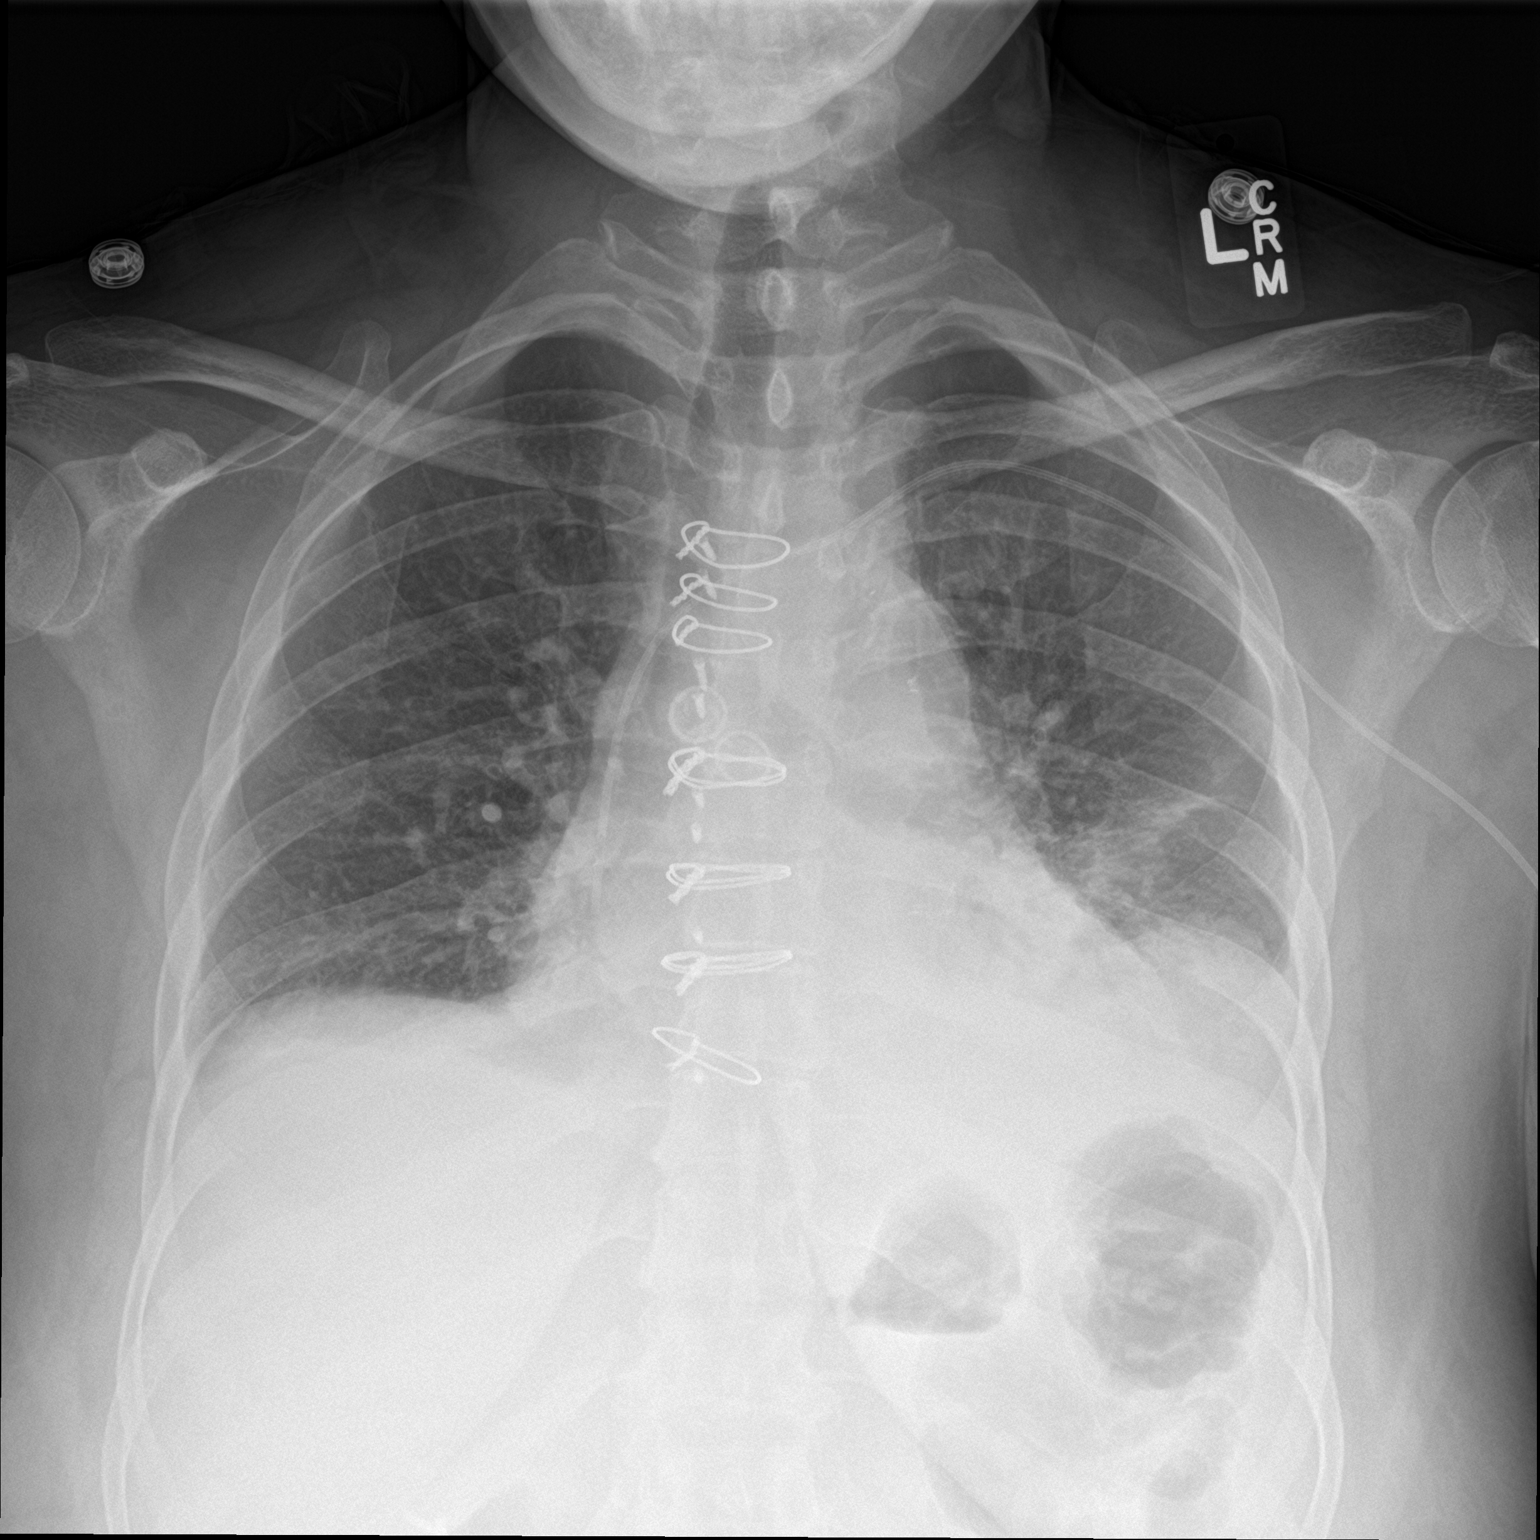

[chest lat]
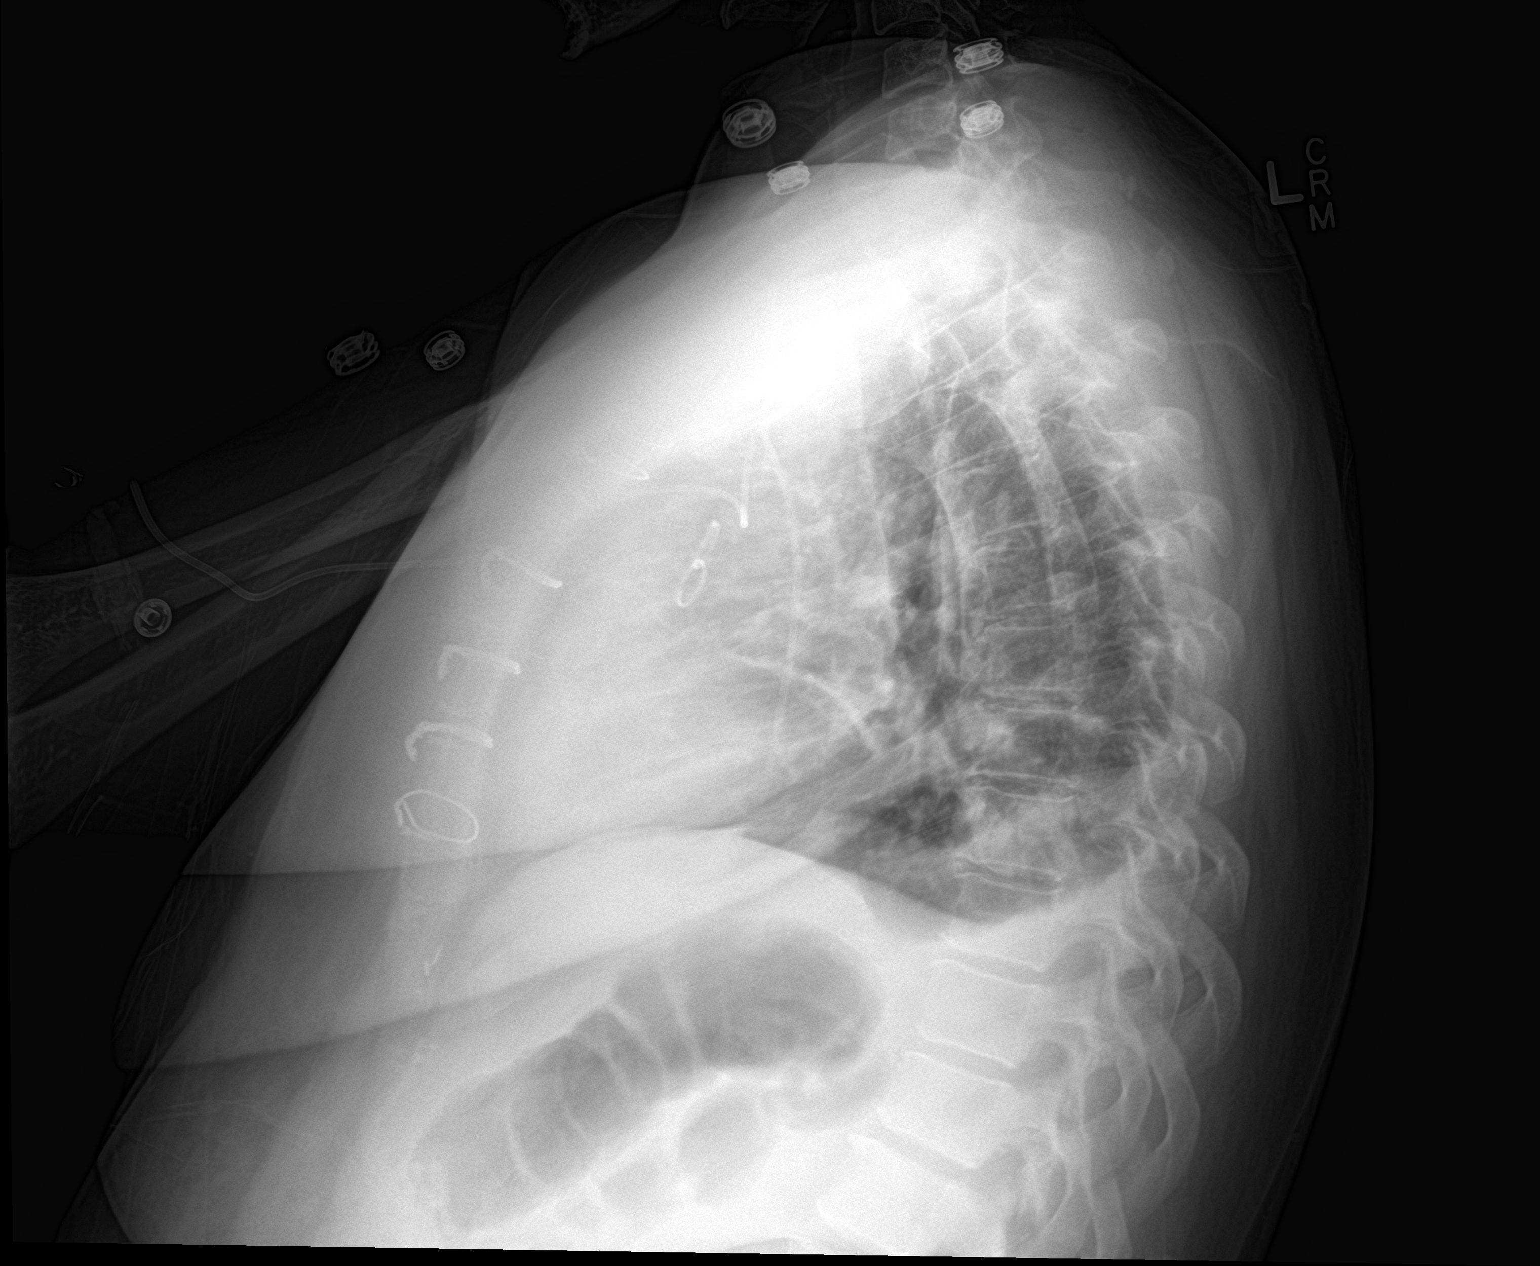

[2 of 2 positions shown; findings below may reference images not displayed]

FINDINGS: The lungs are mildly hypoinflated. There is left basilar atelectasis
or pneumonia. The cardiac silhouette is enlarged and there are post
CABG changes. There is no significant pulmonary vascular congestion.
There small bilateral pleural effusions greatest on the left. The
left-sided PICC line tip projects over the distal aspect of the SVC.
IMPRESSION: Persistent left basilar atelectasis or pneumonia, slightly improved.
Persistent small bilateral pleural effusions. No overt pulmonary
edema.

## 2018-01-17 IMAGING — CT CT ANGIO NECK
1 of 13 series · 4 of 33 positions shown · IV contrast (75CC ISOVUE 370)
Comparison: 08/14/2016

CLINICAL DATA: Followup CVA July 2016.  No complaints today.

EXAM:
CT ANGIOGRAPHY HEAD AND NECK
TECHNIQUE: Multidetector CT imaging of the head and neck was performed using
the standard protocol during bolus administration of intravenous
contrast. Multiplanar CT image reconstructions and MIPs were
obtained to evaluate the vascular anatomy. Carotid stenosis
measurements (when applicable) are obtained utilizing NASCET
criteria, using the distal internal carotid diameter as the
denominator.
CONTRAST:  75 cc Isovue 370 intravenous

[Series 603: axial thin · axial · 0.70mm/px · z∈[+114,+324]mm · 4 of 351 slices shown]
[im 71/351  soft-tissue]
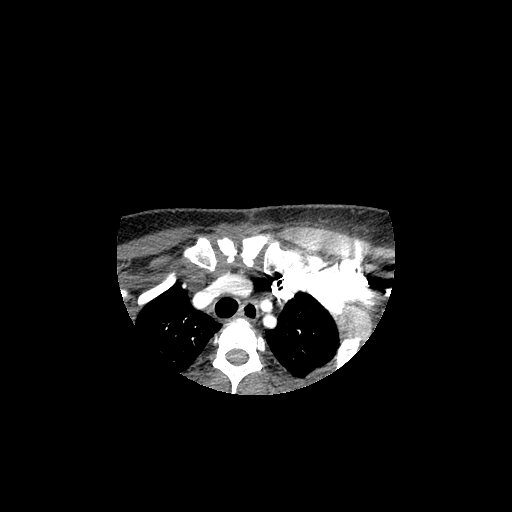
[im 141/351  bone]
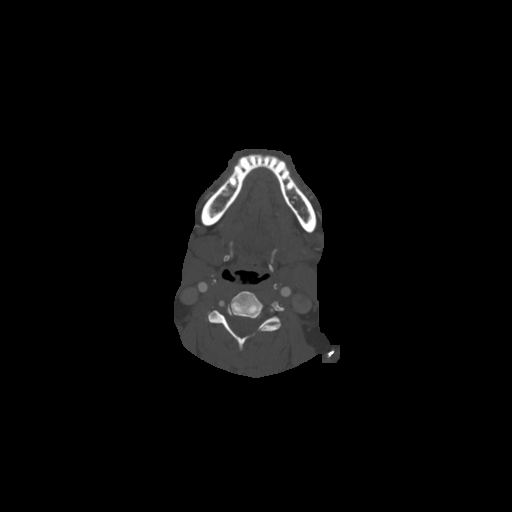
[im 211/351  soft-tissue]
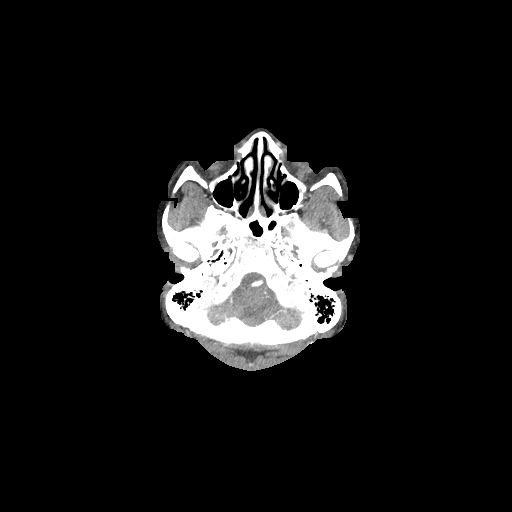
[im 281/351  bone]
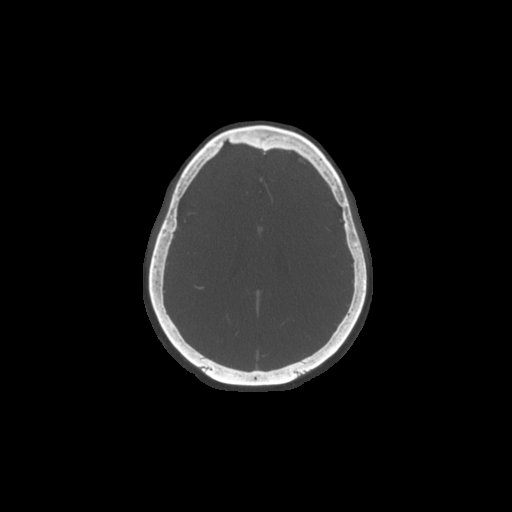

[4 of 33 positions shown; findings below may reference images not displayed]

FINDINGS: CT HEAD FINDINGS

Brain: Re- demonstrated infarcts in the left pons, left cerebral
peduncle, and right cerebral white matter. Left superior cerebellar
infarct is more difficult visualize. No evidence of new infarct. No
acute hemorrhage or hydrocephalus. Negative for mass.

Vascular: Described below

Skull: Negative

Sinuses: Negative

Orbits: Negative

Review of the MIP images confirms the above findings

CTA NECK FINDINGS

Aortic arch: Status post CABG. [REDACTED] and 2 saphenous vein grafts are
patent. Two vessel branching pattern. No stenosis or ulceration.

Right carotid system: Noncalcified plaque at the bulb level with
40-50% stenosis. No ulceration or beading.

Left carotid system: Mild calcified and noncalcified plaque on the
posterior wall of the bulb. No stenosis, ulceration, or dissection.

Vertebral arteries: Atheromatous noncalcified plaque on the proximal
subclavian on the left. No stenosis or ulceration. Mild narrowing of
the left V1 segment. Right vertebral artery is dominant. Negative
for dissection.

Skeleton: No acute or aggressive finding

Other neck: No significant incidental finding.

Upper chest: Negative

Review of the MIP images confirms the above findings

CTA HEAD FINDINGS

Anterior circulation: Atherosclerosis with moderate ICA narrowing at
the posterior genu on the right and paraclinoid segment on the left.
Mild narrowing of the mid left M1 segment. No major branch
occlusion. Negative for aneurysm.

Posterior circulation: Tandem severe stenoses of the non dominant
left vertebral artery. Moderate narrowing of the mid right V4
segment. Luminal defect at the proximal basilar artery is unchanged.
On coronal thick MIPS, there is question of whether this is actually
a fenestration. On MRA from 05/28/2014 there is a subtle flow defect
at this level on source images, not detectable on reformats.
Moderate to advanced mid basilar stenosis, stable. No major branch
occlusion. Mild atherosclerotic irregularity of bilateral posterior
cerebral arteries.

Venous sinuses: Unremarkable

Anatomic variants: notable lack of significant posterior
communicating artery contribution to the posterior circulation.

Delayed phase: Negative

Review of the MIP images confirms the above findings
IMPRESSION: 1. Unchanged filling defect at the proximal basilar. Given
persistence and appearance on reformats today, this may be a tiny
fenestration.
2. Multifocal moderate to advanced bilateral vertebral and basilar
stenoses. These rapidly developed compared to 5695 MRA.
3. Moderate bilateral carotid siphon stenosis. Mild left M1 segment
stenosis.
4. 40-50% right ICA bulb narrowing.
5. No evidence of acute or interval infarct.

## 2018-03-08 ENCOUNTER — Other Ambulatory Visit: Payer: Self-pay

## 2018-03-08 ENCOUNTER — Encounter (HOSPITAL_COMMUNITY): Payer: Self-pay | Admitting: *Deleted

## 2018-03-08 ENCOUNTER — Emergency Department (HOSPITAL_COMMUNITY): Payer: Managed Care, Other (non HMO)

## 2018-03-08 ENCOUNTER — Emergency Department (HOSPITAL_COMMUNITY)
Admission: EM | Admit: 2018-03-08 | Discharge: 2018-03-08 | Disposition: A | Discharge status: Home / Self Care | Payer: Managed Care, Other (non HMO) | Attending: Emergency Medicine | Admitting: Emergency Medicine

## 2018-03-08 DIAGNOSIS — Z7902 Long term (current) use of antithrombotics/antiplatelets: Secondary | ICD-10-CM | POA: Insufficient documentation

## 2018-03-08 DIAGNOSIS — Y999 Unspecified external cause status: Secondary | ICD-10-CM | POA: Diagnosis not present

## 2018-03-08 DIAGNOSIS — I2510 Atherosclerotic heart disease of native coronary artery without angina pectoris: Secondary | ICD-10-CM | POA: Diagnosis not present

## 2018-03-08 DIAGNOSIS — S161XXA Strain of muscle, fascia and tendon at neck level, initial encounter: Secondary | ICD-10-CM

## 2018-03-08 DIAGNOSIS — Z951 Presence of aortocoronary bypass graft: Secondary | ICD-10-CM | POA: Diagnosis not present

## 2018-03-08 DIAGNOSIS — I252 Old myocardial infarction: Secondary | ICD-10-CM | POA: Insufficient documentation

## 2018-03-08 DIAGNOSIS — Z794 Long term (current) use of insulin: Secondary | ICD-10-CM | POA: Insufficient documentation

## 2018-03-08 DIAGNOSIS — Z87891 Personal history of nicotine dependence: Secondary | ICD-10-CM | POA: Insufficient documentation

## 2018-03-08 DIAGNOSIS — I10 Essential (primary) hypertension: Secondary | ICD-10-CM | POA: Insufficient documentation

## 2018-03-08 DIAGNOSIS — Z7982 Long term (current) use of aspirin: Secondary | ICD-10-CM | POA: Diagnosis not present

## 2018-03-08 DIAGNOSIS — Y929 Unspecified place or not applicable: Secondary | ICD-10-CM | POA: Diagnosis not present

## 2018-03-08 DIAGNOSIS — E119 Type 2 diabetes mellitus without complications: Secondary | ICD-10-CM | POA: Insufficient documentation

## 2018-03-08 DIAGNOSIS — S199XXA Unspecified injury of neck, initial encounter: Secondary | ICD-10-CM | POA: Diagnosis present

## 2018-03-08 DIAGNOSIS — Y939 Activity, unspecified: Secondary | ICD-10-CM | POA: Diagnosis not present

## 2018-03-08 NOTE — ED Notes (Signed)
Patient transported to X-ray 

## 2018-03-08 NOTE — Discharge Instructions (Addendum)
Return if any problems.  See your Physician for recheck in 3-4 days if pain persist  ?
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
## 2018-03-08 NOTE — ED Notes (Signed)
Patient is A&Ox4 at this time.  Patient in no signs of distress.  Please see providers note for complete history and physical exam.  
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
## 2018-03-22 ENCOUNTER — Other Ambulatory Visit: Payer: Self-pay

## 2018-03-22 ENCOUNTER — Encounter (HOSPITAL_COMMUNITY): Payer: Self-pay | Admitting: Emergency Medicine

## 2018-03-22 ENCOUNTER — Ambulatory Visit (HOSPITAL_COMMUNITY)
Admission: EM | Admit: 2018-03-22 | Discharge: 2018-03-22 | Disposition: A | Discharge status: Home / Self Care | Payer: Managed Care, Other (non HMO)

## 2018-03-22 DIAGNOSIS — L301 Dyshidrosis [pompholyx]: Secondary | ICD-10-CM

## 2018-03-22 MED ORDER — TRIAMCINOLONE ACETONIDE 0.5 % EX OINT: 1.0000 "application " | TOPICAL_OINTMENT | Freq: Two times a day (BID) | CUTANEOUS | 0 refills | Status: DC

## 2018-03-22 NOTE — Discharge Instructions (Signed)
Take zyrtec daily.  Apply the ointment as prescribed.

## 2018-03-22 NOTE — ED Triage Notes (Signed)
Rash to forearms and lower legs noticed 2 days ago.  Areas do itch, and they are red.  Patient has been using cortisone cream

## 2018-03-22 NOTE — ED Provider Notes (Signed)
03/22/2018 2:50 PM   DOB: 06/20/1971 / MRN: [id number]  SUBJECTIVE:  [name] is a 47 y.o. female presenting for improving rash on the bilateral posterior forearms that itches. This started about 3 days ago.  Assoicates nothing.  Denies pain.  Has tried cortisone which helped. .   She is allergic to eugenol; hydrochlorothiazide; morphine and related; percocet [oxycodone-acetaminophen]; shellfish allergy; sulfa antibiotics; tomato; and nicotine.   She  has a past medical history of Coronary artery disease, Headache, History of kidney stones, Hypertension, Pancreatic injury, Pneumonia (X 1), Stroke (HCC), and Type II diabetes mellitus (HCC).    She  reports that she has quit smoking. Her smoking use included cigarettes. She has a 10.00 pack-year smoking history. She has never used smokeless tobacco. She reports that she does not drink alcohol or use drugs. She  reports that she currently engages in sexual activity. She reports using the following method of birth control/protection: None. The patient  has a past surgical history that includes Tubal ligation; Facial reconstruction surgery (2009); Cardiac catheterization (08/07/2016); Laparoscopic cholecystectomy; Fracture surgery; Cardiac catheterization (N/A, 08/07/2016); Cardiac catheterization (N/A, 08/07/2016); Coronary artery bypass graft (N/A, 08/09/2016); and TEE without cardioversion (N/A, 08/09/2016).  Her family history includes Brain cancer in her paternal grandfather; Diabetes in her father, mother, and paternal grandmother; Heart attack (age of onset: 8542) in her father; Hypertension in her father and mother; Prostate cancer in her father; Stomach cancer in her maternal grandmother.  Review of Systems  Skin: Positive for itching and rash.    OBJECTIVE:  BP 133/62 (BP Location: Right Arm) Comment (BP Location): large cuff  Pulse 63   Temp 98.4 F (36.9 C) (Oral)   Resp 18   SpO2 97%   Wt Readings from Last 3 Encounters:   03/08/18 172 lb (78 kg)  10/24/16 175 lb (79.4 kg)  10/04/16 172 lb (78 kg)   Temp Readings from Last 3 Encounters:  03/22/18 98.4 F (36.9 C) (Oral)  03/08/18 98.4 F (36.9 C) (Oral)  06/19/17 97.9 F (36.6 C) (Oral)   BP Readings from Last 3 Encounters:  03/22/18 133/62  03/08/18 124/76  06/19/17 (!) 169/87   Pulse Readings from Last 3 Encounters:  03/22/18 63  03/08/18 60  06/19/17 83    Physical Exam  Constitutional: She is oriented to person, place, and time. She appears well-nourished. No distress.  Eyes: Pupils are equal, round, and reactive to light. EOM are normal.  Cardiovascular: Normal rate.  Pulmonary/Chest: Effort normal.  Abdominal: She exhibits no distension.  Neurological: She is alert and oriented to person, place, and time. No cranial nerve deficit. Gait normal.  Skin: Skin is dry. Rash (sandpaper like rash with lichenification about the bilateral posterior forearms without TTP or vesicles. No excoriation. ) noted. She is not diaphoretic.  Psychiatric: She has a normal mood and affect.  Vitals reviewed.   No results found for this or any previous visit (from the past 72 hour(s)).  No results found.  ASSESSMENT AND PLAN:   Dyshidrotic eczema    Discharge Instructions     Take zyrtec daily.  Apply the ointment as prescribed.         The patient is advised to call or return to clinic if she does not see an improvement in symptoms, or to seek the care of the closest emergency department if she worsens with the above plan.   Deliah BostonMichael Belia Febo, MHS, PA-C 03/22/2018 2:50 PM   Ofilia Neaslark, Daniil Labarge L, PA-C 03/22/18 1450

## 2018-05-03 ENCOUNTER — Ambulatory Visit (HOSPITAL_COMMUNITY)
Admission: EM | Admit: 2018-05-03 | Discharge: 2018-05-03 | Disposition: A | Discharge status: Home / Self Care | Payer: Managed Care, Other (non HMO) | Attending: Internal Medicine | Admitting: Internal Medicine

## 2018-05-03 ENCOUNTER — Encounter (HOSPITAL_COMMUNITY): Payer: Self-pay

## 2018-05-03 DIAGNOSIS — Z833 Family history of diabetes mellitus: Secondary | ICD-10-CM | POA: Insufficient documentation

## 2018-05-03 DIAGNOSIS — E669 Obesity, unspecified: Secondary | ICD-10-CM | POA: Diagnosis not present

## 2018-05-03 DIAGNOSIS — Z8673 Personal history of transient ischemic attack (TIA), and cerebral infarction without residual deficits: Secondary | ICD-10-CM | POA: Insufficient documentation

## 2018-05-03 DIAGNOSIS — I2510 Atherosclerotic heart disease of native coronary artery without angina pectoris: Secondary | ICD-10-CM | POA: Insufficient documentation

## 2018-05-03 DIAGNOSIS — Z79899 Other long term (current) drug therapy: Secondary | ICD-10-CM | POA: Insufficient documentation

## 2018-05-03 DIAGNOSIS — Z882 Allergy status to sulfonamides status: Secondary | ICD-10-CM | POA: Insufficient documentation

## 2018-05-03 DIAGNOSIS — J029 Acute pharyngitis, unspecified: Secondary | ICD-10-CM | POA: Diagnosis not present

## 2018-05-03 DIAGNOSIS — Z951 Presence of aortocoronary bypass graft: Secondary | ICD-10-CM | POA: Diagnosis not present

## 2018-05-03 DIAGNOSIS — Z6830 Body mass index (BMI) 30.0-30.9, adult: Secondary | ICD-10-CM | POA: Insufficient documentation

## 2018-05-03 DIAGNOSIS — J040 Acute laryngitis: Secondary | ICD-10-CM | POA: Diagnosis not present

## 2018-05-03 DIAGNOSIS — I10 Essential (primary) hypertension: Secondary | ICD-10-CM | POA: Diagnosis not present

## 2018-05-03 DIAGNOSIS — Z885 Allergy status to narcotic agent status: Secondary | ICD-10-CM | POA: Insufficient documentation

## 2018-05-03 DIAGNOSIS — Z87442 Personal history of urinary calculi: Secondary | ICD-10-CM | POA: Insufficient documentation

## 2018-05-03 DIAGNOSIS — G43909 Migraine, unspecified, not intractable, without status migrainosus: Secondary | ICD-10-CM | POA: Insufficient documentation

## 2018-05-03 DIAGNOSIS — Z87891 Personal history of nicotine dependence: Secondary | ICD-10-CM | POA: Insufficient documentation

## 2018-05-03 DIAGNOSIS — E119 Type 2 diabetes mellitus without complications: Secondary | ICD-10-CM | POA: Diagnosis not present

## 2018-05-03 DIAGNOSIS — Z7902 Long term (current) use of antithrombotics/antiplatelets: Secondary | ICD-10-CM | POA: Diagnosis not present

## 2018-05-03 DIAGNOSIS — B9689 Other specified bacterial agents as the cause of diseases classified elsewhere: Secondary | ICD-10-CM | POA: Diagnosis not present

## 2018-05-03 DIAGNOSIS — Z794 Long term (current) use of insulin: Secondary | ICD-10-CM | POA: Insufficient documentation

## 2018-05-03 DIAGNOSIS — Z7982 Long term (current) use of aspirin: Secondary | ICD-10-CM | POA: Insufficient documentation

## 2018-05-03 LAB — POCT RAPID STREP A: STREPTOCOCCUS, GROUP A SCREEN (DIRECT): NEGATIVE

## 2018-05-03 MED ORDER — ACETAMINOPHEN 500 MG PO TABS: 1000.0000 mg | ORAL_TABLET | Freq: Three times a day (TID) | ORAL | 0 refills | Status: DC | PRN

## 2018-05-03 MED ORDER — AMOXICILLIN-POT CLAVULANATE 875-125 MG PO TABS: 1.0000 | ORAL_TABLET | Freq: Two times a day (BID) | ORAL | 0 refills | Status: AC

## 2018-05-03 MED ORDER — FLUTICASONE PROPIONATE 50 MCG/ACT NA SUSP: 1.0000 | Freq: Every day | NASAL | 2 refills | Status: AC

## 2018-05-03 NOTE — ED Provider Notes (Signed)
[name]    CSN: [id number] Arrival date & time: 05/03/18  1707     History   Chief Complaint Chief Complaint  Patient presents with  . Sore Throat    HPI [name] is a 47 y.o. female.   [name] presents with complaints of sore throat, sinus drainage and pressure, and hoarse voice. Started two days ago. Worsening. Has had fever. Only slight occasional cough. No known ill contacts. No ear pain. No gi/gu complaints. No rash. Has been using theraflu and halls which minimally helped. Had some reconstructive surgery which did impact her sinuses so has had sinus issues in the past. Hx of dm, stroke, cad, htn, cabg.     ROS per HPI.      Past Medical History:  Diagnosis Date  . Coronary artery disease   . Headache    due to elevated  BP  . History of kidney stones    "passed them"  . Hypertension   . Pancreatic injury    "damage to pancrease related to gallbladder OR"  . Pneumonia X 1  . Stroke ([place])   . Type II diabetes mellitus [facility])     Patient Active Problem List   Diagnosis Date Noted  . History of stroke 11/01/2016  . Atypical chest pain   . Stroke due to embolism ([place]) 08/19/2016  . Migraine without status migrainosus, not intractable   . Obesity (BMI 30.0-34.9)   . Tachypnea   . Hypoalbuminemia due to protein-calorie malnutrition ([place])   . Leukocytosis   . Acute blood loss anemia   . Pressure injury of skin 08/12/2016  .  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 Essential hypertension 08/07/2016  . Insulin dependent diabetes mellitus with complications ([place]) 32/44/0102  . Non-STEMI (non-ST elevated myocardial infarction) ([place]) 08/07/2016    Past Surgical History:  Procedure Laterality Date  . CARDIAC CATHETERIZATION  08/07/2016  . CARDIAC CATHETERIZATION N/A 08/07/2016   Procedure: Left Heart Cath and Coronary Angiography;  Surgeon: Belva Crome, MD;  Location: [place] [facility];  Service: Cardiovascular;  Laterality: N/A;    . CARDIAC CATHETERIZATION N/A 08/07/2016   Procedure: Intravascular Pressure Wire/FFR Study;  Surgeon: Belva Crome, MD;  Location: [place] [facility];  Service: Cardiovascular;  Laterality: N/A;  . CORONARY ARTERY BYPASS GRAFT N/A 08/09/2016   Procedure: CORONARY ARTERY BYPASS GRAFTING (CABG) x three,SVG to distal circ OM3, SVG to Om1, LIMA to LAD-using left internal mammary artery and right leg greater saphenous vein harvested endoscopically, Sternal plating - right side;  Surgeon: Grace Isaac, MD;  Location: [place];  Service: Open Heart Surgery;  Laterality: N/A;  . FACIAL RECONSTRUCTION SURGERY  2009   "facial fractures"  . FRACTURE SURGERY    . LAPAROSCOPIC CHOLECYSTECTOMY    . TEE WITHOUT CARDIOVERSION N/A 08/09/2016   Procedure: TRANSESOPHAGEAL ECHOCARDIOGRAM (TEE);  Surgeon: Grace Isaac, MD;  Location: [place];  Service: Open Heart Surgery;  Laterality: N/A;  . TUBAL LIGATION      OB History   None      Home Medications    Prior to Admission medications   Medication Sig Start Date End Date Taking? Authorizing Provider  acetaminophen (TYLENOL) 500 MG tablet Take 2 tablets (1,000 mg total) by mouth every 8 (eight) hours as needed. 05/03/18   Zigmund Gottron, NP  amLODipine-benazepril (LOTREL) 10-40 MG capsule Take 1 capsule by mouth daily.    [provider]  amoxicillin-clavulanate (AUGMENTIN) 875-125 MG  tablet Take 1 tablet by mouth every 12 (twelve) hours for 10 days. 05/03/18 05/13/18  Zigmund Gottron, NP  aspirin EC 81 MG tablet Take 81 mg by mouth daily.    [provider]  atorvastatin (LIPITOR) 80 MG tablet Take 1 tablet (80 mg total) by mouth daily at 6 PM. 08/23/16   Love, Ivan Anchors, PA-C  B-D UF III MINI PEN NEEDLES 31G X 5 MM MISC  08/23/16   [provider]  blood glucose meter kit and supplies KIT Dispense based on patient and insurance preference. Use up to four times daily as directed. (FOR ICD-9 250.00, 250.01). 08/23/16   Love,  Ivan Anchors, PA-C  cloNIDine (CATAPRES) 0.3 MG tablet Take 0.3 tablets by mouth daily. 06/17/17   [provider]  clopidogrel (PLAVIX) 75 MG tablet Take 1 tablet (75 mg total) by mouth daily. 08/23/16   Love, Ivan Anchors, PA-C  fluticasone (FLONASE) 50 MCG/ACT nasal spray Place 1 spray into both nostrils daily. 05/03/18   Zigmund Gottron, NP  Insulin Detemir (LEVEMIR) 100 UNIT/ML Pen Inject 30 Units into the skin daily after breakfast. Patient taking differently: Inject 30 Units into the skin daily at 10 pm.  08/23/16   Love, Ivan Anchors, PA-C  Insulin Pen Needle (CLICKFINE PEN NEEDLES) 31G X 8 MM MISC 1 application by Does not apply route as directed. 08/23/16   Love, Ivan Anchors, PA-C  labetalol (NORMODYNE) 200 MG tablet Take 200 mg by mouth 2 (two) times daily.    [provider]  lisinopril (PRINIVIL,ZESTRIL) 20 MG tablet Take 1 tablet (20 mg total) by mouth daily. 09/12/16 06/19/17  Minus Breeding, MD  nitroGLYCERIN (NITROSTAT) 0.4 MG SL tablet Place 1 tablet (0.4 mg total) under the tongue every 5 (five) minutes as needed for chest pain. Max 3 doses--call MD if you need to use 2 or more pills 08/23/16   Love, Ivan Anchors, PA-C  NOVOLOG 100 UNIT/ML injection Takes as a Sliding Scale prn patient states she is not sure how much insulin she takes 08/19/16   [provider]  ondansetron (ZOFRAN ODT) 4 MG disintegrating tablet Take 1 tablet (4 mg total) by mouth every 8 (eight) hours as needed for nausea or vomiting. 06/19/17   Carmin Muskrat, MD  [facility] VERIO test strip  08/23/16   [provider]  pantoprazole (PROTONIX) 40 MG tablet Take 1 tablet (40 mg total) by mouth daily before breakfast. Patient not taking: Reported on 06/19/2017 08/24/16   Love, Ivan Anchors, PA-C  potassium chloride (K-DUR,KLOR-CON) 10 MEQ tablet Take 2 tablets (20 mEq total) by mouth daily. 02/02/17 03/04/17  Fatima Blank, MD  spironolactone (ALDACTONE) 25 MG tablet Take 25 mg by mouth daily.     [provider]  temazepam (RESTORIL) 15 MG capsule Take 15 mg by mouth at bedtime as needed for sleep.     [provider]  traMADol (ULTRAM) 50 MG tablet Take 1 tablet (50 mg total) by mouth every 12 (twelve) hours as needed for moderate pain. Patient not taking: Reported on 06/19/2017 10/04/16   Domenic Moras, PA-C  triamcinolone ointment (KENALOG) 0.5 % Apply 1 application topically 2 (two) times daily. 03/22/18   Tereasa Coop, PA-C    Family History Family History  Problem Relation Age of Onset  . Hypertension Mother   . Diabetes Mother   . Hypertension Father   . Diabetes Father   . Heart attack Father 69       had  CABG  . Prostate cancer Father   . Stomach cancer Maternal Grandmother   . Diabetes Paternal Grandmother        had L amputation  . Brain cancer Paternal Grandfather     Social History Social History   Tobacco Use  . Smoking status: Former Smoker    Packs/day: 0.50    Years: 20.00    Pack years: 10.00    Types: Cigarettes  . Smokeless tobacco: Never Used  Substance Use Topics  . Alcohol use: No  . Drug use: No     Allergies   Eugenol; Hydrochlorothiazide; Morphine and related; Percocet [oxycodone-acetaminophen]; Shellfish allergy; Sulfa antibiotics; Tomato; and Nicotine   Review of Systems Review of Systems   Physical Exam Triage Vital Signs ED Triage Vitals  Enc Vitals Group     BP 05/03/18 1720 (!) 157/84     Pulse Rate 05/03/18 1720 73     Resp 05/03/18 1720 20     Temp 05/03/18 1720 100.1 F (37.8 C)     Temp src --      SpO2 05/03/18 1720 95 %     Weight --      Height --      Head Circumference --      Peak Flow --      Pain Score 05/03/18 1718 9     Pain Loc --      Pain Edu? --      Excl. in [place]? --    No data found.  Updated Vital Signs BP (!) 157/84   Pulse 73   Temp 100.1 F (37.8 C)   Resp 20   SpO2 95%   Physical Exam  Constitutional: She is oriented to person, place, and time. She appears  well-developed and well-nourished. No distress.  HENT:  Head: Normocephalic and atraumatic.  Right Ear: Tympanic membrane, external ear and ear canal normal.  Left Ear: Tympanic membrane, external ear and ear canal normal.  Nose: Rhinorrhea present. Right sinus exhibits maxillary sinus tenderness and frontal sinus tenderness. Left sinus exhibits maxillary sinus tenderness and frontal sinus tenderness.  Mouth/Throat: Uvula is midline, oropharynx is clear and moist and mucous membranes are normal. Tonsils are 1+ on the right. Tonsils are 1+ on the left. No tonsillar exudate.  Voice hoarseness noted   Eyes: Pupils are equal, round, and reactive to light. Conjunctivae and EOM are normal.  Cardiovascular: Normal rate, regular rhythm and normal heart sounds.  Pulmonary/Chest: Effort normal and breath sounds normal.  Neurological: She is alert and oriented to person, place, and time.  Skin: Skin is warm and dry.     UC Treatments / Results  Labs (all labs ordered are listed, but only abnormal results are displayed) Labs Reviewed  CULTURE, GROUP A STREP [facility])  POCT RAPID STREP A    EKG None  Radiology No results found.  Procedures Procedures (including critical care time)  Medications Ordered in UC Medications - No data to display  Initial Impression / Assessment and Plan / UC Course  I have reviewed the triage vital signs and the nursing notes.  Pertinent labs & imaging results that were available during my care of the patient were reviewed by me and considered in my medical decision making (see chart for details).     Low grade temp noted. Sinus hx, diabetes. Opted to provide treatment with augmentin at this time. Continue with supportive cares for comfort. Return precautions provided. If symptoms worsen or do not improve in the  next week to return to be seen or to follow up with PCP.  Patient verbalized understanding and agreeable to plan.   Final Clinical Impressions(s) / UC  Diagnoses   Final diagnoses:  Pharyngitis, unspecified etiology  Laryngitis     Discharge Instructions     Push fluids to ensure adequate hydration and keep secretions thin.  Tylenol and/or ibuprofen as needed for pain or fevers.  Throat lozenges, gargles, chloraseptic spray, warm teas, popsicles etc to help with throat pain.   Complete course of antibiotics.  Daily nasal spray.  If symptoms worsen or do not improve in the next week to return to be seen or to follow up with your PCP.      ED Prescriptions    Medication Sig Dispense Auth. Provider   acetaminophen (TYLENOL) 500 MG tablet Take 2 tablets (1,000 mg total) by mouth every 8 (eight) hours as needed. 30 tablet Augusto Gamble B, NP   amoxicillin-clavulanate (AUGMENTIN) 875-125 MG tablet Take 1 tablet by mouth every 12 (twelve) hours for 10 days. 20 tablet Augusto Gamble B, NP   fluticasone (FLONASE) 50 MCG/ACT nasal spray Place 1 spray into both nostrils daily. 16 g Zigmund Gottron, NP     Controlled Substance Prescriptions [place] Controlled Substance Registry consulted? Not Applicable   Zigmund Gottron, NP 05/03/18 1907

## 2018-05-03 NOTE — ED Triage Notes (Signed)
Reports having sinus drainage since Friday and also endorses sore throat. Denies any fevers.

## 2018-05-03 NOTE — Discharge Instructions (Signed)
Push fluids to ensure adequate hydration and keep secretions thin.  Tylenol and/or ibuprofen as needed for pain or fevers.  Throat lozenges, gargles, chloraseptic spray, warm teas, popsicles etc to help with throat pain.   Complete course of antibiotics.  Daily nasal spray.  If symptoms worsen or do not improve in the next week to return to be seen or to follow up with your PCP.

## 2018-05-06 LAB — CULTURE, GROUP A STREP (THRC)

## 2018-09-30 ENCOUNTER — Encounter: Payer: Self-pay | Admitting: Cardiology

## 2018-09-30 ENCOUNTER — Ambulatory Visit: Payer: Self-pay | Admitting: Cardiology

## 2018-09-30 ENCOUNTER — Ambulatory Visit (INDEPENDENT_AMBULATORY_CARE_PROVIDER_SITE_OTHER): Payer: Managed Care, Other (non HMO) | Admitting: Cardiology

## 2018-09-30 VITALS — BP 164/86 | HR 61 | Ht 59.5 in | Wt 229.1 lb

## 2018-09-30 DIAGNOSIS — Z8673 Personal history of transient ischemic attack (TIA), and cerebral infarction without residual deficits: Secondary | ICD-10-CM

## 2018-09-30 DIAGNOSIS — Z6841 Body Mass Index (BMI) 40.0 and over, adult: Secondary | ICD-10-CM

## 2018-09-30 DIAGNOSIS — E118 Type 2 diabetes mellitus with unspecified complications: Secondary | ICD-10-CM | POA: Diagnosis not present

## 2018-09-30 DIAGNOSIS — I10 Essential (primary) hypertension: Secondary | ICD-10-CM | POA: Diagnosis not present

## 2018-09-30 DIAGNOSIS — I25119 Atherosclerotic heart disease of native coronary artery with unspecified angina pectoris: Secondary | ICD-10-CM

## 2018-09-30 DIAGNOSIS — Z951 Presence of aortocoronary bypass graft: Secondary | ICD-10-CM

## 2018-09-30 DIAGNOSIS — I2510 Atherosclerotic heart disease of native coronary artery without angina pectoris: Secondary | ICD-10-CM

## 2018-09-30 DIAGNOSIS — Z794 Long term (current) use of insulin: Secondary | ICD-10-CM

## 2018-09-30 DIAGNOSIS — IMO0001 Reserved for inherently not codable concepts without codable children: Secondary | ICD-10-CM

## 2018-09-30 HISTORY — DX: Atherosclerotic heart disease of native coronary artery without angina pectoris: I25.10

## 2018-09-30 NOTE — Progress Notes (Signed)
Subjective:  Primary Physician:  Lin Landsman, MD  Patient ID: [name], female    DOB: 17-Oct-1970, 48 y.o.   MRN: [id number]  Chief Complaint  Patient presents with  . Coronary Artery Disease  . Hypertension  . Follow-up    1 yr vas  . Results    vasc US    HPI: [name]  is a 48 y.o. female  with  coronary artery disease and CABG in January 2018 when she presented with pulmonary edema non-ST elevation myocardial infarction.  Patient has uncontrolled hypertension, uncontrolled diabetes mellitus, hyperlipidemia, tobacco use disorder quit since January 2018, post CABG CVA in Jan 2018 with complete neurologic recovery. She still has mild residual tingling and numbness in the right upper extremity and feet. She now presents for f/u and states that she is doing well and except for "fluid weight gain" for which she is taking Furosemide, no other specific symptoms. States she remains active and eats healthy.  Past Medical History:  Diagnosis Date  . CAD (coronary artery disease), native coronary artery 09/30/2018  . Coronary artery disease   . Headache    due to elevated  BP  . History of kidney stones    "passed them"  . Hypertension   . Hypoalbuminemia due to protein-calorie malnutrition ([place])   . Non-STEMI (non-ST elevated myocardial infarction) ([place]) 08/07/2016  . Pancreatic injury    "damage to pancrease related to gallbladder OR"  . Pneumonia X 1  . Stroke ([place])   . Type II diabetes mellitus ([place])     Past Surgical History:  Procedure Laterality Date  . CARDIAC CATHETERIZATION  08/07/2016  . CARDIAC CATHETERIZATION N/A 08/07/2016   Procedure: Left Heart Cath and Coronary Angiography;  Surgeon: Belva Crome, MD;  Location: [place] [facility];  Service: Cardiovascular;  Laterality: N/A;  . CARDIAC CATHETERIZATION N/A 08/07/2016   Procedure: Intravascular Pressure Wire/FFR Study;  Surgeon: Belva Crome, MD;  Location: [place] [facility];  Service:  Cardiovascular;  Laterality: N/A;  . CORONARY ARTERY BYPASS GRAFT N/A 08/09/2016   Procedure: CORONARY ARTERY BYPASS GRAFTING (CABG) x three,SVG to distal circ OM3, SVG to Om1, LIMA to LAD-using left internal mammary artery and right leg greater saphenous vein harvested endoscopically, Sternal plating - right side;  Surgeon: Grace Isaac, MD;  Location: [employer];  Service: Open Heart Surgery;  Laterality: N/A;  . FACIAL RECONSTRUCTION SURGERY  2009   "facial fractures"  . FRACTURE SURGERY    . LAPAROSCOPIC CHOLECYSTECTOMY    . TEE WITHOUT CARDIOVERSION N/A 08/09/2016   Procedure: TRANSESOPHAGEAL ECHOCARDIOGRAM (TEE);  Surgeon: Grace Isaac, MD;  Location: [facility] [place];  Service: Open Heart Surgery;  Laterality: N/A;  . TUBAL LIGATION      Social History   Socioeconomic History  . Marital status: Married    Spouse name: Not on file  . Number of children: 3  . Years of education: Not on file  . Highest education level: Not on file  Occupational History  . Occupation: Buyer, retail: [employer]  Social Needs  . Financial resource strain: Not on file  . Food insecurity:    Worry: Not on file    Inability: Not on file  . Transportation needs:    Medical: Not on file    Non-medical: Not on file  Tobacco Use  . Smoking status: Former Smoker    Packs/day: 0.50    Years: 20.00    Pack years: 10.00  Types: Cigarettes  . Smokeless tobacco: Never Used  Substance and Sexual Activity  . Alcohol use: No  . Drug use: No  . Sexual activity: Yes    Birth control/protection: None  Lifestyle  . Physical activity:    Days per week: Not on file    Minutes per session: Not on file  . Stress: Not on file  Relationships  . Social connections:    Talks on phone: Not on file    Gets together: Not on file    Attends religious service: Not on file    Active member of club or organization: Not on file    Attends meetings of clubs or organizations: Not on file    Relationship status: Not  on file  . Intimate partner violence:    Fear of current or ex partner: Not on file    Emotionally abused: Not on file    Physically abused: Not on file    Forced sexual activity: Not on file  Other Topics Concern  . Not on file  Social History Narrative   Lives with husband in home.  Works, as [employer].  Has AD and 3 children.      Current Outpatient Medications on File Prior to Visit  Medication Sig Dispense Refill  . aspirin EC 81 MG tablet Take 81 mg by mouth 2 (two) times daily.     Marland Kitchen atorvastatin (LIPITOR) 80 MG tablet Take 1 tablet (80 mg total) by mouth daily at 6 PM. 30 tablet 0  . benazepril (LOTENSIN) 10 MG tablet Take 10 mg by mouth daily.    . cloNIDine (CATAPRES) 0.3 MG tablet Take 0.3 tablets by mouth daily.    . clopidogrel (PLAVIX) 75 MG tablet Take 1 tablet (75 mg total) by mouth daily. 30 tablet 0  . fluticasone (FLONASE) 50 MCG/ACT nasal spray Place 1 spray into both nostrils daily. 16 g 2  . furosemide (LASIX) 40 MG tablet Take 40 mg by mouth 2 (two) times daily.    . Insulin Glargine (BASAGLAR KWIKPEN) 100 UNIT/ML SOPN Inject 30 Units into the skin daily.    Marland Kitchen labetalol (NORMODYNE) 200 MG tablet Take 200 mg by mouth 2 (two) times daily.    . nitroGLYCERIN (NITROSTAT) 0.4 MG SL tablet Place 1 tablet (0.4 mg total) under the tongue every 5 (five) minutes as needed for chest pain. Max 3 doses--call MD if you need to use 2 or more pills 30 tablet 0  . ondansetron (ZOFRAN ODT) 4 MG disintegrating tablet Take 1 tablet (4 mg total) by mouth every 8 (eight) hours as needed for nausea or vomiting. 20 tablet 0  . temazepam (RESTORIL) 15 MG capsule Take 15 mg by mouth at bedtime as needed for sleep.     . traMADol (ULTRAM) 50 MG tablet Take 1 tablet (50 mg total) by mouth every 12 (twelve) hours as needed for moderate pain. 15 tablet 0  . triamcinolone ointment (KENALOG) 0.5 % Apply 1 application topically 2 (two) times daily. 30 g 0  . B-D UF III MINI  PEN NEEDLES 31G X 5 MM MISC     . blood glucose meter kit and supplies KIT Dispense based on patient and insurance preference. Use up to four times daily as directed. (FOR ICD-9 250.00, 250.01). 1 each 0  . Insulin Detemir (LEVEMIR) 100 UNIT/ML Pen Inject 30 Units into the skin daily after breakfast. (Patient not taking: Reported on 09/30/2018) 15 mL 0  . Insulin Pen  Needle (CLICKFINE PEN NEEDLES) 31G X 8 MM MISC 1 application by Does not apply route as directed. 100 each 0  . ONETOUCH VERIO test strip     . pantoprazole (PROTONIX) 40 MG tablet Take 1 tablet (40 mg total) by mouth daily before breakfast. (Patient not taking: Reported on 06/19/2017) 30 tablet 0  . potassium chloride (K-DUR,KLOR-CON) 10 MEQ tablet Take 2 tablets (20 mEq total) by mouth daily. 60 tablet 0   No current facility-administered medications on file prior to visit.    Review of Systems  Constitutional: Negative for malaise/fatigue and weight loss.  Respiratory: Negative for cough, hemoptysis and shortness of breath.   Cardiovascular: Negative for chest pain, palpitations, claudication and leg swelling.  Gastrointestinal: Negative for abdominal pain, blood in stool, constipation, heartburn and vomiting.  Genitourinary: Negative for dysuria.  Musculoskeletal: Negative for joint pain and myalgias.  Neurological: Negative for dizziness, focal weakness and headaches.  Endo/Heme/Allergies: Does not bruise/bleed easily.  Psychiatric/Behavioral: Negative for depression. The patient is not nervous/anxious.   All other systems reviewed and are negative.      Objective:  Blood pressure (!) 164/86, pulse 61, height 4' 11.5" (1.511 m), weight 229 lb 1.6 oz (103.9 kg), SpO2 97 %. Body mass index is 45.5 kg/m.  Physical Exam  Constitutional: She appears well-developed. No distress.  HENT:  Head: Atraumatic.  Eyes: Conjunctivae are normal.  Neck: Neck supple. No JVD present. No thyromegaly present.  Cardiovascular: Normal  rate, regular rhythm and normal heart sounds. Exam reveals no gallop.  No murmur heard. Pulses:      Carotid pulses are 2+ on the right side and 2+ on the left side.      Dorsalis pedis pulses are 2+ on the right side and 2+ on the left side.       Posterior tibial pulses are 2+ on the right side and 2+ on the left side.  Femoral and popliteal pulse difficult to feel due to patient's body habitus.   Pulmonary/Chest: Effort normal and breath sounds normal.  Abdominal: Soft. Bowel sounds are normal.  Musculoskeletal: Normal range of motion.        General: Edema (trace) present.  Neurological: She is alert.  Skin: Skin is warm and dry.  Psychiatric: She has a normal mood and affect.    CARDIAC STUDIES:   Carotid artery duplex 24-Aug-2018: Minimal stenosis in the right internal carotid artery (1-15%). No evidence of significant stenosis in the left carotid vessels.  Antegrade right vertebral artery flow. Antegrade left vertebral artery flow. Compared to 08/19/17, right ICA stenosis of 16-49% now minimal stenosis. F/U studies if clinically indicated.  Echocardiogram 08/09/2016: Normal LVEF, 60-65%. Mild mitral annular calcification.  Assessment & Recommendations:    1. Coronary artery disease involving native coronary artery of native heart with angina pectoris ([place]) EKG 09/30/2018: Normal  sinus rhythm at rate of 62 bpm, normal axis, left atrial abnormality,  LVH with repolarization abnormality, cannot exclude high lateral ischemia.  S/P CABG x 3 LIMA-LAD, SVG-OM3, SVG-OM1-08/09/16 Tenny Craw, MD. Presented with pulmonary edema.  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  2. Essential hypertension  3. Insulin dependent diabetes mellitus with complications ([place])  4. H/O: CVA (cerebrovascular accident)  5. Class 3 severe obesity due to excess calories with serious comorbidity and body mass index (BMI) of 45.0 to  49.9 in adult ([place])  6.  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  Except for mild chronic dyspnea, no other specific symptoms.  She has gained significant amount of weight since last office visit.  I explained to her that she is not extremely fluid overloaded and taking furosemide and also drinking excessive amounts of fluid will only lead to a electrolyte imbalance.  We discussed regarding calorie restriction.  Otherwise she is on appropriate medical therapy.  Lipids are being managed by her PCP.  Blood pressure although high here, is well controlled per patient and states that the present medications are working well.  I have discontinued Plavix as it is been greater than 2 years since her CABG.  She'll continue with aspirin, she would like to take it 81 mg b.i.d. which was not changed today.  I'll see her back on an annual basis.  Adrian Prows, MD, [facility] 09/30/2018, 3:52 PM [facility] [place] Pager: [phone] Office: [phone] If no answer Cell [phone]

## 2018-09-30 NOTE — Progress Notes (Deleted)
Subjective:  Primary Physician:  Lin Landsman, MD  Patient ID: [name], female    DOB: [birth year], 48 y.o.   MRN: [id number]  No chief complaint on file.   HPI: [name]  is a 48 y.o. female  with  coronary artery disease and CABG in January 2018 when she presented with pulmonary edema non-ST elevation myocardial infarction. Patient has uncontrolled hypertension, uncontrolled diabetes mellitus, hyperlipidemia, tobacco use disorder quit since January 2018, post CABG CVA in Jan 2018 with complete neurologic recovery. She still has mild residual tingling and numbness in the right upper extremity and feet. This is her annual visit.  Past Medical History:  Diagnosis Date  . CAD (coronary artery disease), native coronary artery 09/30/2018  . Coronary artery disease   . Headache    due to elevated  BP  . History of kidney stones    "passed them"  . Hypertension   . Hypoalbuminemia due to protein-calorie malnutrition ([place])   . Non-STEMI (non-ST elevated myocardial infarction) ([place]) 08/07/2016  . Pancreatic injury    "damage to pancrease related to gallbladder OR"  . Pneumonia X 1  . Stroke ([place])   . Type II diabetes mellitus ([place])     Past Surgical History:  Procedure Laterality Date  . CARDIAC CATHETERIZATION  08/07/2016  . CARDIAC CATHETERIZATION N/A 08/07/2016   Procedure: Left Heart Cath and Coronary Angiography;  Surgeon: Belva Crome, MD;  Location: [facility];  Service: Cardiovascular;  Laterality: N/A;  . CARDIAC CATHETERIZATION N/A 08/07/2016   Procedure: Intravascular Pressure Wire/FFR Study;  Surgeon: Belva Crome, MD;  Location: [facility];  Service: Cardiovascular;  Laterality: N/A;  . CORONARY ARTERY BYPASS GRAFT N/A 08/09/2016   Procedure: CORONARY ARTERY BYPASS GRAFTING (CABG) x three,SVG to distal circ OM3, SVG to Om1, LIMA to LAD-using left internal mammary artery and right leg greater saphenous vein harvested endoscopically,  Sternal plating - right side;  Surgeon: Grace Isaac, MD;  Location: [facility];  Service: Open Heart Surgery;  Laterality: N/A;  . FACIAL RECONSTRUCTION SURGERY  2009   "facial fractures"  . FRACTURE SURGERY    . LAPAROSCOPIC CHOLECYSTECTOMY    . TEE WITHOUT CARDIOVERSION N/A 08/09/2016   Procedure: TRANSESOPHAGEAL ECHOCARDIOGRAM (TEE);  Surgeon: Grace Isaac, MD;  Location: [place];  Service: Open Heart Surgery;  Laterality: N/A;  . TUBAL LIGATION      Social History   Socioeconomic History  . Marital status: Married    Spouse name: Not on file  . Number of children: Not on file  . Years of education: Not on file  . Highest education level: Not on file  Occupational History  . Occupation: Buyer, retail: [employer]  Social Needs  . Financial resource strain: Not on file  . Food insecurity:    Worry: Not on file    Inability: Not on file  . Transportation needs:    Medical: Not on file    Non-medical: Not on file  Tobacco Use  . Smoking status: Former Smoker    Packs/day: 0.50    Years: 20.00    Pack years: 10.00    Types: Cigarettes  . Smokeless tobacco: Never Used  Substance and Sexual Activity  . Alcohol use: No  . Drug use: No  . Sexual activity: Yes    Birth control/protection: None  Lifestyle  . Physical activity:    Days per week: Not on file    Minutes per  session: Not on file  . Stress: Not on file  Relationships  . Social connections:    Talks on phone: Not on file    Gets together: Not on file    Attends religious service: Not on file    Active member of club or organization: Not on file    Attends meetings of clubs or organizations: Not on file    Relationship status: Not on file  . Intimate partner violence:    Fear of current or ex partner: Not on file    Emotionally abused: Not on file    Physically abused: Not on file    Forced sexual activity: Not on file  Other Topics Concern  . Not on file  Social History Narrative   Lives with  husband in home.  Works, as [employer].  Has AD and 3 children.      Current Outpatient Medications on File Prior to Visit  Medication Sig Dispense Refill  . acetaminophen (TYLENOL) 500 MG tablet Take 2 tablets (1,000 mg total) by mouth every 8 (eight) hours as needed. 30 tablet 0  . amLODipine-benazepril (LOTREL) 10-40 MG capsule Take 1 capsule by mouth daily.    Marland Kitchen aspirin EC 81 MG tablet Take 81 mg by mouth daily.    Marland Kitchen atorvastatin (LIPITOR) 80 MG tablet Take 1 tablet (80 mg total) by mouth daily at 6 PM. 30 tablet 0  . B-D UF III MINI PEN NEEDLES 31G X 5 MM MISC     . blood glucose meter kit and supplies KIT Dispense based on patient and insurance preference. Use up to four times daily as directed. (FOR ICD-9 250.00, 250.01). 1 each 0  . cloNIDine (CATAPRES) 0.3 MG tablet Take 0.3 tablets by mouth daily.    . clopidogrel (PLAVIX) 75 MG tablet Take 1 tablet (75 mg total) by mouth daily. 30 tablet 0  . fluticasone (FLONASE) 50 MCG/ACT nasal spray Place 1 spray into both nostrils daily. 16 g 2  . Insulin Detemir (LEVEMIR) 100 UNIT/ML Pen Inject 30 Units into the skin daily after breakfast. (Patient taking differently: Inject 30 Units into the skin daily at 10 pm. ) 15 mL 0  . Insulin Pen Needle (CLICKFINE PEN NEEDLES) 31G X 8 MM MISC 1 application by Does not apply route as directed. 100 each 0  . labetalol (NORMODYNE) 200 MG tablet Take 200 mg by mouth 2 (two) times daily.    Marland Kitchen lisinopril (PRINIVIL,ZESTRIL) 20 MG tablet Take 1 tablet (20 mg total) by mouth daily. 90 tablet 3  . nitroGLYCERIN (NITROSTAT) 0.4 MG SL tablet Place 1 tablet (0.4 mg total) under the tongue every 5 (five) minutes as needed for chest pain. Max 3 doses--call MD if you need to use 2 or more pills 30 tablet 0  . NOVOLOG 100 UNIT/ML injection Takes as a Sliding Scale prn patient states she is not sure how much insulin she takes    . ondansetron (ZOFRAN ODT) 4 MG disintegrating tablet Take 1 tablet (4 mg  total) by mouth every 8 (eight) hours as needed for nausea or vomiting. 20 tablet 0  . ONETOUCH VERIO test strip     . pantoprazole (PROTONIX) 40 MG tablet Take 1 tablet (40 mg total) by mouth daily before breakfast. (Patient not taking: Reported on 06/19/2017) 30 tablet 0  . potassium chloride (K-DUR,KLOR-CON) 10 MEQ tablet Take 2 tablets (20 mEq total) by mouth daily. 60 tablet 0  . spironolactone (ALDACTONE) 25 MG tablet  Take 25 mg by mouth daily.    . temazepam (RESTORIL) 15 MG capsule Take 15 mg by mouth at bedtime as needed for sleep.     . traMADol (ULTRAM) 50 MG tablet Take 1 tablet (50 mg total) by mouth every 12 (twelve) hours as needed for moderate pain. (Patient not taking: Reported on 06/19/2017) 15 tablet 0  . triamcinolone ointment (KENALOG) 0.5 % Apply 1 application topically 2 (two) times daily. 30 g 0   No current facility-administered medications on file prior to visit.      Review of Systems  Constitutional: Negative for malaise/fatigue and weight loss.  Respiratory: Negative for cough, hemoptysis and shortness of breath.   Cardiovascular: Negative for chest pain, palpitations, claudication and leg swelling.  Gastrointestinal: Negative for abdominal pain, blood in stool, constipation, heartburn and vomiting.  Genitourinary: Negative for dysuria.  Musculoskeletal: Positive for back pain. Negative for joint pain and myalgias.  Neurological: Negative for dizziness, focal weakness and headaches.  Endo/Heme/Allergies: Does not bruise/bleed easily.  Psychiatric/Behavioral: Negative for depression. The patient is not nervous/anxious.   All other systems reviewed and are negative.      Objective:  There were no vitals taken for this visit. There is no height or weight on file to calculate BMI.  Physical Exam  Constitutional: She appears well-developed. No distress.  Short stature and mildly obese.  HENT:  Head: Atraumatic.  Eyes: Conjunctivae are normal.  Neck: Neck  supple. No JVD present. No thyromegaly present.  Cardiovascular: Normal rate, regular rhythm, normal heart sounds and intact distal pulses. Exam reveals no gallop.  No murmur heard. Pulmonary/Chest: Effort normal and breath sounds normal.  Sternotomy scar noted  Abdominal: Soft. Bowel sounds are normal.  Musculoskeletal: Normal range of motion.        General: No edema.  Neurological: She is alert.  Skin: Skin is warm and dry.  Psychiatric: She has a normal mood and affect.    CARDIAC STUDIES:   Echocardiogram 08/09/2016: Normal LVEF, 60-65%. Mild mitral annular calcification.  CABG x 3 LIMA-LAD, SVG-OM3, SVG-OM1-08/09/16 Tenny Craw, MD. Presented with pulmonary edema. 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   Assessment & Recommendations:   1. Coronary artery disease of native artery of native heart with stable angina pectoris (HCC) CABG x 3 LIMA-LAD, SVG-OM3, SVG-OM1-08/09/16 Tenny Craw, MD. Presented with pulmonary edema. 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  EKG 08/27/2017: Normal sinus rhythm at rate of 57 bpm, normal axis. LVH with repolarization abnormality, cannot exclude inferior and lateral ischemia. No significant change from EKG 12/14/2016.  2.  S/P CABG x 31 Jul 2016  3. History of stroke  4. Hypercholesteremia  5. Uncontrolled type 2 diabetes mellitus with hyperglycemia ([place])  6.  Asymptomatic right carotid stenosis Carotid artery duplex 08/19/2017: Stenosis in the right internal carotid artery (16-49%). Antegrade right vertebral artery flow. Antegrade left vertebral artery flow. Follow up in one year is appropriate if clinically indicated. Compared to the study done on 02/18/2017, right ICA stenosis velocity has reduced from greater than 50%. Left ICA stenosis of less than 50% no longer present.  7. Laboratory  examination  09/16/2017: Hemoglobin A1c 7.8%.  Cholesterol 91, triglycerides 35, HDL 45, LDL 39.  CBC normal.  Glucose 118, creatinine 0.72, EGFR 101/117, potassium 3.5, alkaline phos 131, CMP otherwise normal.  TSH 0.9.  Recommendation: ***  Adrian Prows, MD, [facility] 09/30/2018, 5:41 AM  [facility] [place] Pager: [phone] Office: [phone] If no answer Cell [phone]

## 2019-03-10 ENCOUNTER — Encounter (HOSPITAL_COMMUNITY): Payer: Self-pay | Admitting: Emergency Medicine

## 2019-03-10 ENCOUNTER — Other Ambulatory Visit: Payer: Self-pay

## 2019-03-10 ENCOUNTER — Ambulatory Visit (HOSPITAL_COMMUNITY)
Admission: EM | Admit: 2019-03-10 | Discharge: 2019-03-10 | Disposition: A | Discharge status: Home / Self Care | Payer: Managed Care, Other (non HMO) | Attending: Family Medicine | Admitting: Family Medicine

## 2019-03-10 DIAGNOSIS — I10 Essential (primary) hypertension: Secondary | ICD-10-CM

## 2019-03-10 DIAGNOSIS — T50905A Adverse effect of unspecified drugs, medicaments and biological substances, initial encounter: Secondary | ICD-10-CM | POA: Diagnosis present

## 2019-03-10 DIAGNOSIS — L918 Other hypertrophic disorders of the skin: Secondary | ICD-10-CM | POA: Diagnosis present

## 2019-03-10 NOTE — ED Triage Notes (Signed)
Pt here for allergic reaction to compound W she used on skin tag

## 2019-03-10 NOTE — Discharge Instructions (Signed)
Your blood pressure was noted to be elevated during your visit today. You may return here within the next few days to recheck if unable to see your primary care doctor. ° °

## 2019-03-13 NOTE — ED Provider Notes (Signed)
[facility]   [id number] 03/10/19 Arrival Time: 1700  ASSESSMENT & PLAN:  1. Inflamed skin tag   2. Adverse effect of over-the-counter medication, initial encounter   3. Essential hypertension     Procedure: Inflamed skin over and around L inguinal cleaned with betadine x 3 and dried. 2 cc plain lidocaine without epinephrine for local anesthesia. #15 scalpel to shave presumed skin tag. Placed in formalin and sent for pathology. No complications. Minimal bleeding. Cauterized area with silver nitrate. No further bleeding. Bandaged. Simple wound care instructions given.  Recommend f/u with PCP to recheck BP. She agrees.  Reviewed expectations re: course of current medical issues. Questions answered. Outlined signs and symptoms indicating need for more acute intervention. Patient verbalized understanding. After Visit Summary given.   SUBJECTIVE: History from: patient. [name] is a 48 y.o. female who presents for evaluation of a possible allergic reaction to OTC Compound W cream. Has been putting this on a large skin tag of L inguinal region; present for years. "After I put the cream on it burned a lot." Stopped using. Burning has subsided almost completely. No enlargement of skin tag. No erythema. No itching.  Increased blood pressure noted today. Reports she has not taken medications today.  She reports no chest pain on exertion, no dyspnea on exertion, no swelling of ankles, no orthostatic dizziness or lightheadedness, no orthopnea or paroxysmal nocturnal dyspnea, no palpitations and no intermittent claudication symptoms.  ROS: As per HPI.   OBJECTIVE:  Vitals:   03/10/19 1740  BP: (!) 184/91  Pulse: 82  Resp: 18  Temp: 98 F (36.7 C)  TempSrc: Temporal  SpO2: 96%    General appearance: alert; no distress Abdomen: soft, non-tender Extremities: no cyanosis or edema; symmetrical with no gross deformities Skin: warm and dry; inflamed skin tag  measuring 1x2 cm over L inguinal area Neurologic: normal gait Psychological: alert and cooperative; normal mood and affect  Labs: Labs Reviewed  DERMATOLOGY PATHOLOGY     Allergies  Allergen Reactions  . Eugenol Hives and Other (See Comments)    Burns mouth  . Hydrochlorothiazide Anaphylaxis  . Morphine And Related Anaphylaxis  . Percocet [Oxycodone-Acetaminophen] Anaphylaxis and Nausea And Vomiting  . Shellfish Allergy Anaphylaxis  . Sulfa Antibiotics Anaphylaxis  . Tomato Anaphylaxis  . Amlodipine Hives and Nausea And Vomiting  . Nicotine Hives and Swelling    Allergic reaction to patches     Past Medical History:  Diagnosis Date  . CAD (coronary artery disease), native coronary artery 09/30/2018  . Coronary artery disease   . Headache    due to elevated  BP  . History of kidney stones    "passed them"  . Hypertension   . Hypoalbuminemia due to protein-calorie malnutrition (HCC)   . Non-STEMI (non-ST elevated myocardial infarction) (H[facility]) 08/07/2016  . Pancreatic injury    "damage to pancrease related to gallbladder OR"  . Pneumonia X 1  . Stroke ([facility])   . Type II diabetes mellitus ([facility])    Social History   Socioeconomic History  . Marital status: Married    Spouse name: Not on file  . Number of children: 3  . Years of education: Not on file  . Highest education level: Not on file  Occupational History  . Occupation: Event organiserupervisor    Employer: [employer]  Social Needs  . Financial resource strain: Not on file  . Food insecurity    Worry: Not on file    Inability: Not on file  .  Transportation needs    Medical: Not on file    Non-medical: Not on file  Tobacco Use  . Smoking status: Former Smoker    Packs/day: 0.50    Years: 20.00    Pack years: 10.00    Types: Cigarettes  . Smokeless tobacco: Never Used  Substance and Sexual Activity  . Alcohol use: No  . Drug use: No  . Sexual activity: Yes    Birth control/protection: None  Lifestyle  . Physical  activity    Days per week: Not on file    Minutes per session: Not on file  . Stress: Not on file  Relationships  . Social Herbalist on phone: Not on file    Gets together: Not on file    Attends religious service: Not on file    Active member of club or organization: Not on file    Attends meetings of clubs or organizations: Not on file    Relationship status: Not on file  . Intimate partner violence    Fear of current or ex partner: Not on file    Emotionally abused: Not on file    Physically abused: Not on file    Forced sexual activity: Not on file  Other Topics Concern  . Not on file  Social History Narrative   Lives with husband in home.  Works, as [employer].  Has AD and 3 children.     Family History  Problem Relation Age of Onset  . Hypertension Mother   . Diabetes Mother   . Hypertension Father   . Diabetes Father   . Heart attack Father 89       had CABG  . Prostate cancer Father   . Stomach cancer Maternal Grandmother   . Diabetes Paternal Grandmother        had L amputation  . Brain cancer Paternal Grandfather   . Hypertension Sister    Past Surgical History:  Procedure Laterality Date  . CARDIAC CATHETERIZATION  08/07/2016  . CARDIAC CATHETERIZATION N/A 08/07/2016   Procedure: Left Heart Cath and Coronary Angiography;  Surgeon: Belva Crome, MD;  Location: [facility];  Service: Cardiovascular;  Laterality: N/A;  . CARDIAC CATHETERIZATION N/A 08/07/2016   Procedure: Intravascular Pressure Wire/FFR Study;  Surgeon: Belva Crome, MD;  Location: [facility];  Service: Cardiovascular;  Laterality: N/A;  . CORONARY ARTERY BYPASS GRAFT N/A 08/09/2016   Procedure: CORONARY ARTERY BYPASS GRAFTING (CABG) x three,SVG to distal circ OM3, SVG to Om1, LIMA to LAD-using left internal mammary artery and right leg greater saphenous vein harvested endoscopically, Sternal plating - right side;  Surgeon: Grace Isaac, MD;   Location: [facility];  Service: Open Heart Surgery;  Laterality: N/A;  . FACIAL RECONSTRUCTION SURGERY  2009   "facial fractures"  . FRACTURE SURGERY    . LAPAROSCOPIC CHOLECYSTECTOMY    . TEE WITHOUT CARDIOVERSION N/A 08/09/2016   Procedure: TRANSESOPHAGEAL ECHOCARDIOGRAM (TEE);  Surgeon: Grace Isaac, MD;  Location: [place];  Service: Open Heart Surgery;  Laterality: N/A;  . TUBAL LIGATION       Vanessa Kick, MD 03/13/19 [phone]

## 2019-03-15 ENCOUNTER — Telehealth (HOSPITAL_COMMUNITY): Payer: Self-pay | Admitting: Emergency Medicine

## 2019-03-15 NOTE — Telephone Encounter (Signed)
Per Dr. Mannie Stabile, report indicates Skin tag. Patient contacted and made aware of    results, all questions answered

## 2019-08-08 ENCOUNTER — Encounter (HOSPITAL_COMMUNITY): Payer: Self-pay | Admitting: *Deleted

## 2019-08-08 ENCOUNTER — Other Ambulatory Visit: Payer: Self-pay

## 2019-08-08 ENCOUNTER — Ambulatory Visit (HOSPITAL_COMMUNITY)
Admission: EM | Admit: 2019-08-08 | Discharge: 2019-08-08 | Disposition: A | Discharge status: Home / Self Care | Payer: Managed Care, Other (non HMO) | Attending: Family Medicine | Admitting: Family Medicine

## 2019-08-08 DIAGNOSIS — Z87891 Personal history of nicotine dependence: Secondary | ICD-10-CM | POA: Diagnosis not present

## 2019-08-08 DIAGNOSIS — Z7982 Long term (current) use of aspirin: Secondary | ICD-10-CM | POA: Diagnosis not present

## 2019-08-08 DIAGNOSIS — Z20822 Contact with and (suspected) exposure to covid-19: Secondary | ICD-10-CM | POA: Insufficient documentation

## 2019-08-08 DIAGNOSIS — Z794 Long term (current) use of insulin: Secondary | ICD-10-CM | POA: Insufficient documentation

## 2019-08-08 DIAGNOSIS — Z888 Allergy status to other drugs, medicaments and biological substances status: Secondary | ICD-10-CM | POA: Diagnosis not present

## 2019-08-08 DIAGNOSIS — Z885 Allergy status to narcotic agent status: Secondary | ICD-10-CM | POA: Insufficient documentation

## 2019-08-08 DIAGNOSIS — Z79899 Other long term (current) drug therapy: Secondary | ICD-10-CM | POA: Insufficient documentation

## 2019-08-08 DIAGNOSIS — I252 Old myocardial infarction: Secondary | ICD-10-CM | POA: Diagnosis not present

## 2019-08-08 DIAGNOSIS — E119 Type 2 diabetes mellitus without complications: Secondary | ICD-10-CM | POA: Insufficient documentation

## 2019-08-08 DIAGNOSIS — Z8673 Personal history of transient ischemic attack (TIA), and cerebral infarction without residual deficits: Secondary | ICD-10-CM | POA: Diagnosis not present

## 2019-08-08 DIAGNOSIS — Z882 Allergy status to sulfonamides status: Secondary | ICD-10-CM | POA: Diagnosis not present

## 2019-08-08 DIAGNOSIS — Z951 Presence of aortocoronary bypass graft: Secondary | ICD-10-CM | POA: Diagnosis not present

## 2019-08-08 DIAGNOSIS — I10 Essential (primary) hypertension: Secondary | ICD-10-CM | POA: Diagnosis not present

## 2019-08-08 DIAGNOSIS — I2510 Atherosclerotic heart disease of native coronary artery without angina pectoris: Secondary | ICD-10-CM | POA: Insufficient documentation

## 2019-08-08 HISTORY — DX: Acute myocardial infarction, unspecified: I21.9

## 2019-08-08 NOTE — Discharge Instructions (Addendum)
We have tested you for COVID  Go home and quarantine until we get y our results.  Follow up as needed for continued or worsening symptoms     

## 2019-08-08 NOTE — ED Triage Notes (Signed)
C/O family exposure to Covid.  Denies sxs.

## 2019-08-09 NOTE — ED Provider Notes (Signed)
[place]    CSN: [id number] Arrival date & time: 08/08/19  1745      History   Chief Complaint Chief Complaint  Patient presents with  . Covid Test    HPI [name] is a 49 y.o. female.   Patient is a 49 year old female with past medical history of CAD, headache, hypertension, MI, pancreatic injury, pneumonia, stroke, diabetes.  She presents today with close exposure to Covid.  Mother was admitted to the hospital this morning for Covid and respiratory failure.  She currently denies any symptoms     Past Medical History:  Diagnosis Date  . CAD (coronary artery disease), native coronary artery 09/30/2018  . Coronary artery disease   . Headache    due to elevated  BP  . History of kidney stones    "passed them"  . Hypertension   . Hypoalbuminemia due to protein-calorie malnutrition ([place])   . Myocardial infarction ([place])   . Non-STEMI (non-ST elevated myocardial infarction) ([place]) 08/07/2016  . Pancreatic injury    "damage to pancrease related to gallbladder OR"  . Pneumonia X 1  . Stroke ([place])   . Type II diabetes mellitus [facility])     Patient Active Problem List   Diagnosis Date Noted  . CAD (coronary artery disease), native coronary artery 09/30/2018  . History of stroke 11/01/2016  . Stroke due to embolism ([place]) 08/19/2016  . Migraine without status migrainosus, not intractable   . Obesity (BMI 30.0-34.9)   . Tachypnea   . Leukocytosis   . Pressure injury of skin 08/12/2016  .  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 Essential hypertension 08/07/2016  . Insulin dependent diabetes mellitus with complications 57/84/6962    Past Surgical History:  Procedure Laterality Date  . CARDIAC CATHETERIZATION  08/07/2016  . CARDIAC CATHETERIZATION N/A 08/07/2016   Procedure: Left Heart Cath and Coronary Angiography;  Surgeon: Belva Crome, MD;  Location: [place] [facility];  Service: Cardiovascular;  Laterality: N/A;  . CARDIAC CATHETERIZATION N/A  08/07/2016   Procedure: Intravascular Pressure Wire/FFR Study;  Surgeon: Belva Crome, MD;  Location: [place] [facility];  Service: Cardiovascular;  Laterality: N/A;  . CORONARY ARTERY BYPASS GRAFT N/A 08/09/2016   Procedure: CORONARY ARTERY BYPASS GRAFTING (CABG) x three,SVG to distal circ OM3, SVG to Om1, LIMA to LAD-using left internal mammary artery and right leg greater saphenous vein harvested endoscopically, Sternal plating - right side;  Surgeon: Grace Isaac, MD;  Location: [place];  Service: Open Heart Surgery;  Laterality: N/A;  . FACIAL RECONSTRUCTION SURGERY  2009   "facial fractures"  . FRACTURE SURGERY    . LAPAROSCOPIC CHOLECYSTECTOMY    . TEE WITHOUT CARDIOVERSION N/A 08/09/2016   Procedure: TRANSESOPHAGEAL ECHOCARDIOGRAM (TEE);  Surgeon: Grace Isaac, MD;  Location: [place];  Service: Open Heart Surgery;  Laterality: N/A;  . TUBAL LIGATION      OB History   No obstetric history on file.      Home Medications    Prior to Admission medications   Medication Sig Start Date End Date Taking? Authorizing Provider  aspirin EC 81 MG tablet Take 81 mg by mouth 2 (two) times daily.    Yes [provider]  atorvastatin (LIPITOR) 80 MG tablet Take 1 tablet (80 mg total) by mouth daily at 6 PM. 08/23/16  Yes Love, Ivan Anchors, PA-C  benazepril (LOTENSIN) 10 MG tablet Take 10 mg by mouth daily.   Yes [provider]  dilTIAZem HCl Coated Beads (CARTIA XT PO) Take by mouth.   Yes [provider]  fluticasone (FLONASE) 50 MCG/ACT nasal spray Place 1 spray into both nostrils daily. 05/03/18  Yes Burky, Lanelle Bal B, NP  Insulin Glargine (BASAGLAR KWIKPEN) 100 UNIT/ML SOPN Inject 30 Units into the skin daily. 09/26/18  Yes [provider]  labetalol (NORMODYNE) 200 MG tablet Take 200 mg by mouth 2 (two) times daily.   Yes [provider]  traMADol (ULTRAM) 50 MG tablet Take 1 tablet (50 mg total) by mouth every 12 (twelve) hours as needed for  moderate pain. 10/04/16  Yes Domenic Moras, PA-C  B-D UF III MINI PEN NEEDLES 31G X 5 MM MISC  08/23/16   [provider]  blood glucose meter kit and supplies KIT Dispense based on patient and insurance preference. Use up to four times daily as directed. (FOR ICD-9 250.00, 250.01). 08/23/16   Love, Ivan Anchors, PA-C  cloNIDine (CATAPRES) 0.3 MG tablet Take 0.3 tablets by mouth daily. 06/17/17   [provider]  furosemide (LASIX) 40 MG tablet Take 40 mg by mouth 2 (two) times daily. 09/26/18   [provider]  Insulin Detemir (LEVEMIR) 100 UNIT/ML Pen Inject 30 Units into the skin daily after breakfast. Patient not taking: Reported on 09/30/2018 08/23/16   Love, Ivan Anchors, PA-C  Insulin Pen Needle (CLICKFINE PEN NEEDLES) 31G X 8 MM MISC 1 application by Does not apply route as directed. 08/23/16   Love, Ivan Anchors, PA-C  nitroGLYCERIN (NITROSTAT) 0.4 MG SL tablet Place 1 tablet (0.4 mg total) under the tongue every 5 (five) minutes as needed for chest pain. Max 3 doses--call MD if you need to use 2 or more pills 08/23/16   Love, Ivan Anchors, PA-C  ondansetron (ZOFRAN ODT) 4 MG disintegrating tablet Take 1 tablet (4 mg total) by mouth every 8 (eight) hours as needed for nausea or vomiting. 06/19/17   Carmin Muskrat, MD  [facility] VERIO test strip  08/23/16   [provider]  pantoprazole (PROTONIX) 40 MG tablet Take 1 tablet (40 mg total) by mouth daily before breakfast. Patient not taking: Reported on 06/19/2017 08/24/16   Love, Ivan Anchors, PA-C  potassium chloride (K-DUR,KLOR-CON) 10 MEQ tablet Take 2 tablets (20 mEq total) by mouth daily. 02/02/17 03/04/17  Fatima Blank, MD  temazepam (RESTORIL) 15 MG capsule Take 15 mg by mouth at bedtime as needed for sleep.     [provider]  triamcinolone ointment (KENALOG) 0.5 % Apply 1 application topically 2 (two) times daily. 03/22/18   Tereasa Coop, PA-C    Family History Family History  Problem Relation Age of Onset   . Hypertension Mother   . Diabetes Mother   . Hypertension Father   . Diabetes Father   . Heart attack Father 84       had CABG  . Prostate cancer Father   . Stomach cancer Maternal Grandmother   . Diabetes Paternal Grandmother        had L amputation  . Brain cancer Paternal Grandfather   . Hypertension Sister     Social History Social History   Tobacco Use  . Smoking status: Former Smoker    Packs/day: 0.50    Years: 20.00    Pack years: 10.00    Types: Cigarettes  . Smokeless tobacco: Never Used  Substance Use Topics  . Alcohol use: No  . Drug use: No     Allergies   Eugenol, Hydrochlorothiazide,  Morphine and related, Percocet [oxycodone-acetaminophen], Shellfish allergy, Sulfa antibiotics, Tomato, Amlodipine, and Nicotine   Review of Systems Review of Systems  Constitutional: Negative for chills and fever.  HENT: Negative for ear pain and sore throat.   Eyes: Negative for pain and visual disturbance.  Respiratory: Negative for cough and shortness of breath.   Cardiovascular: Negative for chest pain and palpitations.  Gastrointestinal: Negative for abdominal pain and vomiting.  Genitourinary: Negative for dysuria and hematuria.  Musculoskeletal: Negative for arthralgias and back pain.  Skin: Negative for color change and rash.  Neurological: Negative for seizures and syncope.  All other systems reviewed and are negative.    Physical Exam Triage Vital Signs ED Triage Vitals  Enc Vitals Group     BP 08/08/19 1908 (!) 183/73     Pulse Rate 08/08/19 1908 72     Resp 08/08/19 1908 16     Temp 08/08/19 1908 98.4 F (36.9 C)     Temp Source 08/08/19 1908 Oral     SpO2 08/08/19 1908 100 %     Weight --      Height --      Head Circumference --      Peak Flow --      Pain Score 08/08/19 1907 0     Pain Loc --      Pain Edu? --      Excl. in [place]? --    No data found.  Updated Vital Signs BP (!) 183/73   Pulse 72   Temp 98.4 F (36.9 C) (Oral)    Resp 16   LMP 07/18/2019 (Exact Date)   SpO2 100%   Visual Acuity Right Eye Distance:   Left Eye Distance:   Bilateral Distance:    Right Eye Near:   Left Eye Near:    Bilateral Near:     Physical Exam Vitals and nursing note reviewed.  Constitutional:      General: She is not in acute distress.    Appearance: Normal appearance. She is not ill-appearing, toxic-appearing or diaphoretic.  HENT:     Head: Normocephalic.     Nose: Nose normal.  Eyes:     Conjunctiva/sclera: Conjunctivae normal.  Pulmonary:     Effort: Pulmonary effort is normal.  Musculoskeletal:        General: Normal range of motion.     Cervical back: Normal range of motion.  Skin:    General: Skin is warm and dry.     Findings: No rash.  Neurological:     Mental Status: She is alert.  Psychiatric:        Mood and Affect: Mood normal.      UC Treatments / Results  Labs (all labs ordered are listed, but only abnormal results are displayed) Labs Reviewed  NOVEL CORONAVIRUS, NAA (HOSP ORDER, SEND-OUT TO REF LAB; TAT 18-24 HRS)    EKG   Radiology No results found.  Procedures Procedures (including critical care time)  Medications Ordered in UC Medications - No data to display  Initial Impression / Assessment and Plan / UC Course  I have reviewed the triage vital signs and the nursing notes.  Pertinent labs & imaging results that were available during my care of the patient were reviewed by me and considered in my medical decision making (see chart for details).    Exposure to COVID-swab sent for testing with labs pending.  Quarantine precautions given Final Clinical Impressions(s) / UC Diagnoses   Final diagnoses:  Exposure  to COVID-19 virus     Discharge Instructions     We have tested you for COVID  Go home and quarantine until we get your results.  Follow up as needed for continued or worsening symptoms      ED Prescriptions    None     PDMP not reviewed this  encounter.   Orvan July, NP 08/09/19 [phone]

## 2019-08-11 LAB — NOVEL CORONAVIRUS, NAA (HOSP ORDER, SEND-OUT TO REF LAB; TAT 18-24 HRS): SARS-CoV-2, NAA: NOT DETECTED

## 2019-09-30 ENCOUNTER — Ambulatory Visit: Payer: Managed Care, Other (non HMO) | Admitting: Cardiology

## 2020-02-28 ENCOUNTER — Emergency Department (HOSPITAL_COMMUNITY): Payer: BLUE CROSS/BLUE SHIELD

## 2020-02-28 ENCOUNTER — Other Ambulatory Visit: Payer: Self-pay

## 2020-02-28 ENCOUNTER — Encounter (HOSPITAL_COMMUNITY): Payer: Self-pay | Admitting: Emergency Medicine

## 2020-02-28 ENCOUNTER — Emergency Department (HOSPITAL_COMMUNITY)
Admission: EM | Admit: 2020-02-28 | Discharge: 2020-02-28 | Disposition: A | Discharge status: Home / Self Care | Payer: BLUE CROSS/BLUE SHIELD | Attending: Emergency Medicine | Admitting: Emergency Medicine

## 2020-02-28 DIAGNOSIS — Z87442 Personal history of urinary calculi: Secondary | ICD-10-CM | POA: Diagnosis not present

## 2020-02-28 DIAGNOSIS — Z87891 Personal history of nicotine dependence: Secondary | ICD-10-CM | POA: Diagnosis not present

## 2020-02-28 DIAGNOSIS — R42 Dizziness and giddiness: Secondary | ICD-10-CM | POA: Insufficient documentation

## 2020-02-28 DIAGNOSIS — R112 Nausea with vomiting, unspecified: Secondary | ICD-10-CM | POA: Diagnosis not present

## 2020-02-28 DIAGNOSIS — I252 Old myocardial infarction: Secondary | ICD-10-CM | POA: Insufficient documentation

## 2020-02-28 DIAGNOSIS — I10 Essential (primary) hypertension: Secondary | ICD-10-CM | POA: Insufficient documentation

## 2020-02-28 DIAGNOSIS — E669 Obesity, unspecified: Secondary | ICD-10-CM | POA: Diagnosis not present

## 2020-02-28 DIAGNOSIS — E109 Type 1 diabetes mellitus without complications: Secondary | ICD-10-CM | POA: Diagnosis not present

## 2020-02-28 DIAGNOSIS — I2510 Atherosclerotic heart disease of native coronary artery without angina pectoris: Secondary | ICD-10-CM | POA: Diagnosis not present

## 2020-02-28 DIAGNOSIS — Z6841 Body Mass Index (BMI) 40.0 and over, adult: Secondary | ICD-10-CM | POA: Insufficient documentation

## 2020-02-28 DIAGNOSIS — R109 Unspecified abdominal pain: Secondary | ICD-10-CM | POA: Insufficient documentation

## 2020-02-28 DIAGNOSIS — Z5321 Procedure and treatment not carried out due to patient leaving prior to being seen by health care provider: Secondary | ICD-10-CM | POA: Insufficient documentation

## 2020-02-28 LAB — URINALYSIS, ROUTINE W REFLEX MICROSCOPIC
Bacteria, UA: NONE SEEN
Bilirubin Urine: NEGATIVE
Glucose, UA: >=500 mg/dL — AB
Hgb urine dipstick: NEGATIVE
Ketones, ur: NEGATIVE mg/dL
Leukocytes,Ua: NEGATIVE
Nitrite: NEGATIVE
Protein, ur: NEGATIVE mg/dL
Specific Gravity, Urine: 1.018 (ref 1.005–1.030)
pH: 6 (ref 5.0–8.0)

## 2020-02-28 LAB — CBC
HCT: 37.7 % (ref 36.0–46.0)
Hemoglobin: 12.4 g/dL (ref 12.0–15.0)
MCH: 29.6 pg (ref 26.0–34.0)
MCHC: 32.9 g/dL (ref 30.0–36.0)
MCV: 90 fL (ref 80.0–100.0)
Platelets: 304 10*3/uL (ref 150–400)
RBC: 4.19 MIL/uL (ref 3.87–5.11)
RDW: 14.4 % (ref 11.5–15.5)
WBC: 8.8 10*3/uL (ref 4.0–10.5)
nRBC: 0 % (ref 0.0–0.2)

## 2020-02-28 LAB — COMPREHENSIVE METABOLIC PANEL
ALT: 17 U/L (ref 0–44)
AST: 18 U/L (ref 15–41)
Albumin: 3.7 g/dL (ref 3.5–5.0)
Alkaline Phosphatase: 94 U/L (ref 38–126)
Anion gap: 7 (ref 5–15)
BUN: 15 mg/dL (ref 6–20)
CO2: 20 mmol/L — ABNORMAL LOW (ref 22–32)
Calcium: 9 mg/dL (ref 8.9–10.3)
Chloride: 108 mmol/L (ref 98–111)
Creatinine, Ser: 0.87 mg/dL (ref 0.44–1.00)
GFR calc Af Amer: >60 mL/min (ref >60)
GFR calc non Af Amer: >60 mL/min (ref >60)
Glucose, Bld: 314 mg/dL — ABNORMAL HIGH (ref 70–99)
Potassium: 3.8 mmol/L (ref 3.5–5.1)
Sodium: 135 mmol/L (ref 135–145)
Total Bilirubin: 0.8 mg/dL (ref 0.3–1.2)
Total Protein: 7.2 g/dL (ref 6.5–8.1)

## 2020-02-28 LAB — TROPONIN I (HIGH SENSITIVITY): Troponin I (High Sensitivity): 4 ng/L (ref <18)

## 2020-02-28 LAB — I-STAT BETA HCG BLOOD, ED (MC, WL, AP ONLY): I-stat hCG, quantitative: <5 m[IU]/mL (ref <5)

## 2020-02-28 LAB — LIPASE, BLOOD: Lipase: 25 U/L (ref 11–51)

## 2020-02-28 LAB — CBG MONITORING, ED: Glucose-Capillary: 247 mg/dL — ABNORMAL HIGH (ref 70–99)

## 2020-02-28 MED ORDER — SODIUM CHLORIDE 0.9% FLUSH: 3.0000 mL | Freq: Once | INTRAVENOUS | Status: DC

## 2020-02-28 MED ORDER — MECLIZINE HCL 25 MG PO TABS
25.0000 mg | ORAL_TABLET | Freq: Three times a day (TID) | ORAL | 0 refills | Status: DC | PRN
Start: 2020-02-28 — End: 2021-02-06

## 2020-02-28 MED ORDER — MECLIZINE HCL 25 MG PO TABS
25.0000 mg | ORAL_TABLET | Freq: Once | ORAL | Status: AC
  Administered 2020-02-28: 25 mg via ORAL
  Filled 2020-02-28: qty 1

## 2020-02-28 NOTE — ED Triage Notes (Signed)
Patient complaining of abdominal pain, nausea, and vomiting. Patient was brought from [place] by family. Patient is responsive. Patient answered questions.

## 2020-02-28 NOTE — ED Provider Notes (Addendum)
[facility] DEPT Provider Note   CSN: [id number] Arrival date & time: 02/28/20  0449     History Chief Complaint  Patient presents with  . Abdominal Pain    Nelani Schmelzle is a 49 y.o. female history of obesity, CAD/CABG, kidney stones, CVA, diabetes, hypertension. Patient presents for nausea vomiting onset 2 AM this morning.  She reports that she was in her normal state of health, sleeping when she woke up to go use the bathroom upon sitting up and getting out of bed she became very dizzy describes room spinning sensation symptoms were severe and caused her to have multiple episodes of nonbloody/nonbilious emesis.  After several episodes of emesis she began having a crampy abdominal pain.  EMS was called and she was brought to [facility] however due to long wait time she left and presented to this ER instead.  She reports that her dizziness/lightheadedness has gradually improved, she is no longer having abdominal pain reports mild nausea.  Patient reports similar episode of lightheadedness after MI in 2018.  Denies fever/chills, headache, vision changes, neck stiffness, numbness/weakness, tingling, difficulty speaking, fall/injury, chest pain, dysuria/hematuria, diarrhea or any additional concerns.   HPI     Past Medical History:  Diagnosis Date  . CAD (coronary artery disease), native coronary artery 09/30/2018  . Coronary artery disease   . Headache    due to elevated  BP  . History of kidney stones    "passed them"  . Hypertension   . Hypoalbuminemia due to protein-calorie malnutrition ([place])   . Myocardial infarction ([place])   . Non-STEMI (non-ST elevated myocardial infarction) ([place]) 08/07/2016  . Pancreatic injury    "damage to pancrease related to gallbladder OR"  . Pneumonia X 1  . Stroke ([place])   . Type II diabetes mellitus [facility])     Patient Active Problem List   Diagnosis Date Noted  . CAD (coronary artery disease), native  coronary artery 09/30/2018  . History of stroke 11/01/2016  . Stroke due to embolism ([place]) 08/19/2016  . Migraine without status migrainosus, not intractable   . Obesity (BMI 30.0-34.9)   . Tachypnea   . Leukocytosis   . Pressure injury of skin 08/12/2016  .  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 Essential hypertension 08/07/2016  . Insulin dependent diabetes mellitus with complications 09/38/1829    Past Surgical History:  Procedure Laterality Date  . CARDIAC CATHETERIZATION  08/07/2016  . CARDIAC CATHETERIZATION N/A 08/07/2016   Procedure: Left Heart Cath and Coronary Angiography;  Surgeon: Belva Crome, MD;  Location: [place] CV LAB;  Service: Cardiovascular;  Laterality: N/A;  . CARDIAC CATHETERIZATION N/A 08/07/2016   Procedure: Intravascular Pressure Wire/FFR Study;  Surgeon: Belva Crome, MD;  Location: [place] CV LAB;  Service: Cardiovascular;  Laterality: N/A;  . CORONARY ARTERY BYPASS GRAFT N/A 08/09/2016   Procedure: CORONARY ARTERY BYPASS GRAFTING (CABG) x three,SVG to distal circ OM3, SVG to Om1, LIMA to LAD-using left internal mammary artery and right leg greater saphenous vein harvested endoscopically, Sternal plating - right side;  Surgeon: Grace Isaac, MD;  Location: [place];  Service: Open Heart Surgery;  Laterality: N/A;  . FACIAL RECONSTRUCTION SURGERY  2009   "facial fractures"  . FRACTURE SURGERY    . LAPAROSCOPIC CHOLECYSTECTOMY    . TEE WITHOUT CARDIOVERSION N/A 08/09/2016   Procedure: TRANSESOPHAGEAL ECHOCARDIOGRAM (TEE);  Surgeon: Grace Isaac, MD;  Location: [place];  Service: Open Heart Surgery;  Laterality:  N/A;  . TUBAL LIGATION       OB History   No obstetric history on file.     Family History  Problem Relation Age of Onset  . Hypertension Mother   . Diabetes Mother   . Hypertension Father   . Diabetes Father   . Heart attack Father 72       had CABG  . Prostate cancer Father   . Stomach cancer Maternal Grandmother   . Diabetes  Paternal Grandmother        had L amputation  . Brain cancer Paternal Grandfather   . Hypertension Sister     Social History   Tobacco Use  . Smoking status: Former Smoker    Packs/day: 0.50    Years: 20.00    Pack years: 10.00    Types: Cigarettes  . Smokeless tobacco: Never Used  Vaping Use  . Vaping Use: Never used  Substance Use Topics  . Alcohol use: No  . Drug use: No    Home Medications Prior to Admission medications   Medication Sig Start Date End Date Taking? Authorizing Provider  amLODipine-benazepril (LOTREL) 10-40 MG capsule Take 1 capsule by mouth daily.   Yes [provider]  aspirin EC 81 MG tablet Take 81 mg by mouth 2 (two) times daily.    Yes [provider]  atorvastatin (LIPITOR) 80 MG tablet Take 1 tablet (80 mg total) by mouth daily at 6 PM. 08/23/16  Yes Love, Ivan Anchors, PA-C  diltiazem (CARDIZEM CD) 240 MG 24 hr capsule Take 240 mg by mouth daily. 02/03/20  Yes [provider]  furosemide (LASIX) 20 MG tablet Take 20 mg by mouth 2 (two) times daily. 01/24/20  Yes [provider]  Insulin Glargine (BASAGLAR KWIKPEN) 100 UNIT/ML SOPN Inject 30 Units into the skin daily as needed (only if high sugar is high).  09/26/18  Yes [provider]  labetalol (NORMODYNE) 200 MG tablet Take 200 mg by mouth 2 (two) times daily.   Yes [provider]  nitroGLYCERIN (NITROSTAT) 0.4 MG SL tablet Place 1 tablet (0.4 mg total) under the tongue every 5 (five) minutes as needed for chest pain. Max 3 doses--call MD if you need to use 2 or more pills 08/23/16  Yes Love, Ivan Anchors, PA-C  potassium chloride (MICRO-K) 10 MEQ CR capsule Take 10 mEq by mouth daily. 02/04/20  Yes [provider]  traMADol (ULTRAM) 50 MG tablet Take 1 tablet (50 mg total) by mouth every 12 (twelve) hours as needed for moderate pain. 10/04/16  Yes Domenic Moras, PA-C  B-D UF III MINI PEN NEEDLES 31G X 5 MM MISC  08/23/16   [provider]  blood  glucose meter kit and supplies KIT Dispense based on patient and insurance preference. Use up to four times daily as directed. (FOR ICD-9 250.00, 250.01). 08/23/16   Love, Ivan Anchors, PA-C  fluticasone (FLONASE) 50 MCG/ACT nasal spray Place 1 spray into both nostrils daily. Patient not taking: Reported on 02/28/2020 05/03/18   Augusto Gamble B, NP  Insulin Detemir (LEVEMIR) 100 UNIT/ML Pen Inject 30 Units into the skin daily after breakfast. Patient not taking: Reported on 09/30/2018 08/23/16   Love, Ivan Anchors, PA-C  Insulin Pen Needle (CLICKFINE PEN NEEDLES) 31G X 8 MM MISC 1 application by Does not apply route as directed. 08/23/16   Love, Ivan Anchors, PA-C  meclizine (ANTIVERT) 25 MG tablet Take 1 tablet (25 mg total) by mouth 3 (three) times daily  as needed for dizziness. 02/28/20   Harlene Salts A, PA-C  ondansetron (ZOFRAN ODT) 4 MG disintegrating tablet Take 1 tablet (4 mg total) by mouth every 8 (eight) hours as needed for nausea or vomiting. 06/19/17   Gerhard Munch, MD  [facility] VERIO test strip  08/23/16   [provider]  pantoprazole (PROTONIX) 40 MG tablet Take 1 tablet (40 mg total) by mouth daily before breakfast. Patient not taking: Reported on 06/19/2017 08/24/16   Love, Evlyn Kanner, PA-C  potassium chloride (K-DUR,KLOR-CON) 10 MEQ tablet Take 2 tablets (20 mEq total) by mouth daily. 02/02/17 03/04/17  Nira Conn, MD  temazepam (RESTORIL) 15 MG capsule Take 15 mg by mouth at bedtime as needed for sleep.     [provider]  triamcinolone ointment (KENALOG) 0.5 % Apply 1 application topically 2 (two) times daily. 03/22/18   Ofilia Neas, PA-C    Allergies    Eugenol, Hydrochlorothiazide, Morphine, Morphine and related, Other, Oxycodone-acetaminophen, Percocet [oxycodone-acetaminophen], Shellfish allergy, Sulfa antibiotics, Tomato, Amlodipine, and Nicotine  Review of Systems   Review of Systems Ten systems are reviewed and are negative for acute change except as  noted in the HPI   Physical Exam Updated Vital Signs BP (!) 158/90   Pulse 95   Temp 98.7 F (37.1 C) (Oral)   Resp (!) 25   Ht 4\' 11"  (1.499 m)   Wt (!) 95.3 kg   SpO2 100%   BMI 42.41 kg/m   Physical Exam Constitutional:      General: She is not in acute distress.    Appearance: Normal appearance. She is well-developed. She is not ill-appearing or diaphoretic.  HENT:     Head: Normocephalic and atraumatic.  Eyes:     General: Vision grossly intact. Gaze aligned appropriately.     Pupils: Pupils are equal, round, and reactive to light.  Neck:     Trachea: Trachea and phonation normal.  Pulmonary:     Effort: Pulmonary effort is normal. No respiratory distress.  Abdominal:     General: There is no distension.     Palpations: Abdomen is soft.     Tenderness: There is no abdominal tenderness. There is no guarding or rebound.  Musculoskeletal:        General: Normal range of motion.     Cervical back: Normal range of motion.  Skin:    General: Skin is warm and dry.  Neurological:     Mental Status: She is alert.     GCS: GCS eye subscore is 4. GCS verbal subscore is 5. GCS motor subscore is 6.     Comments: Speech is clear and goal oriented, follows commands Major Cranial nerves without deficit, no facial droop Normal strength in upper and lower extremities bilaterally including dorsiflexion and plantar flexion, strong and equal grip strength Sensation normal to light and sharp touch Moves extremities without ataxia, coordination intact Normal finger to nose and rapid alternating movements Neg romberg, no pronator drift Normal gait Normal heel-shin and balance  Psychiatric:        Behavior: Behavior normal.     ED Results / Procedures / Treatments   Labs (all labs ordered are listed, but only abnormal results are displayed) Labs Reviewed  CBG MONITORING, ED - Abnormal; Notable for the following components:      Result Value   Glucose-Capillary 247 (*)    All  other components within normal limits  TROPONIN I (HIGH SENSITIVITY)    EKG EKG Interpretation  Date/Time:  Monday February 28 2020 12:04:18 EDT Ventricular Rate:  69 PR Interval:    QRS Duration: 86 QT Interval:  364 QTC Calculation: 390 R Axis:   49 Text Interpretation: Sinus rhythm Nonspecific T abnormalities, lateral leads Minimal ST elevation, anterior leads No significant change since last tracing Confirmed by Calvert Cantor [phone]) on 02/28/2020 12:19:03 PM   Radiology DG Chest Port 1 View  Result Date: 02/28/2020 CLINICAL DATA:  Shortness of breath, abdominal pain EXAM: PORTABLE CHEST 1 VIEW COMPARISON:  January 03, 2017 FINDINGS: The cardiomediastinal silhouette is unchanged in contour.Status post median sternotomy and CABG. No pleural effusion. No pneumothorax. No acute pleuroparenchymal abnormality. Visualized abdomen is unremarkable. No acute displaced rib fracture. IMPRESSION: No acute cardiopulmonary abnormality. Electronically Signed   By: Valentino Saxon MD   On: 02/28/2020 12:31    Procedures Procedures (including critical care time)  Medications Ordered in ED Medications  meclizine (ANTIVERT) tablet 25 mg (25 mg Oral Given 02/28/20 1401)    ED Course  I have reviewed the triage vital signs and the nursing notes.  Pertinent labs & imaging results that were available during my care of the patient were reviewed by me and considered in my medical decision making (see chart for details).    MDM Rules/Calculators/A&P                          Additional history obtained from: 1. Nursing notes from this visit. 2. Electronic medical record review.  I reviewed labs obtained around 4 AM this morning.  These include: CBC within normal limits, no leukocytosis to suggest infection and no anemia. CMP which shows glucose 314 and bicarb 20.  Otherwise within normal limits, no emergent electrolyte derangement, AKI, LFT elevation or gap. Lipase within normal limits. Pregnancy  test negative. ---------------------------------------------------------- High sensitive troponin 4, onset symptoms greater than 4 hours no indication for delta.  EKG: Sinus rhythm Nonspecific T abnormalities, lateral leads Minimal ST elevation, anterior leads No significant change since last tracing Confirmed by Calvert Cantor [phone]) on 02/28/2020 12:19:03 PM  CXR:  IMPRESSION:  No acute cardiopulmonary abnormality.  ------------- Patient reassessed resting comfortably no acute distress reports the room spinning sensation/lightheadedness has greatly improved.  Patient's husband at bedside.  Vital signs within normal limits.  Patient now informs me that she had a similar episode of room spinning sensation 1 month ago that gradually resolved over the course of the day.  Symptoms are suspicious for vertigo, will treat with meclizine and obtain orthostatic vital signs.  Discussed case with Dr. Karle Starch who agrees with plan. - Patient reassessed resting comfortably no acute distress reports resolution of symptoms following meclizine she is requesting discharge.  Symptoms today are suggestive of peripheral vertigo.  Repeat neuro examination is within normal limits, will prescribe meclizine and encourage PCP follow-up.  No evidence to suggest CVA as etiology of dizziness at this time.  Additionally patient's nausea vomiting and crampy abdominal pain followed the vertiginous symptoms that began this morning, she has no abdominal pain on examination and nausea vomiting have completely resolved doubt cholecystitis, appendicitis, SBO, perforation or other emergent intra-abdominal pathologies at this time.  Finally patient has had no chest pain or shortness of breath, troponin within normal limits and EKG/chest x-ray is reassuring, doubt ACS, PE, dissection or other emergent cardiopulmonary etiology of symptoms at this time.   At this time there does not appear to be any evidence of an acute emergency medical  condition  and the patient appears stable for discharge with appropriate outpatient follow up. Diagnosis was discussed with patient who verbalizes understanding of care plan and is agreeable to discharge. I have discussed return precautions with patient and significant other who verbalizes understanding. Patient encouraged to follow-up with their PCP. All questions answered.  Patient's case discussed with Dr. Karle Starch who agrees with plan to discharge with follow-up.   Note: Portions of this report may have been transcribed using voice recognition software. Every effort was made to ensure accuracy; however, inadvertent computerized transcription errors may still be present. Final Clinical Impression(s) / ED Diagnoses Final diagnoses:  Dizziness  Vertigo    Rx / DC Orders ED Discharge Orders         Ordered    meclizine (ANTIVERT) 25 MG tablet  3 times daily PRN     Discontinue  Reprint     02/28/20 1448           Deliah Boston, PA-C 02/28/20 1453    Deliah Boston, PA-C 02/28/20 1505    Truddie Hidden, MD 02/28/20 [phone]

## 2020-02-28 NOTE — ED Triage Notes (Signed)
The pt  Arrived by [facility] from home abd pain nausea and vomiting ahen she woke up toooooonight

## 2020-02-28 NOTE — Discharge Instructions (Addendum)
At this time there does not appear to be the presence of an emergent medical condition, however there is always the potential for conditions to change. Please read and follow the below instructions.  Please return to the Emergency Department immediately for any new or worsening symptoms. Please be sure to follow up with your Primary Care Provider within one week regarding your visit today; please call their office to schedule an appointment even if you are feeling better for a follow-up visit. You have been prescribed a medication called meclizine today you may use it for room spinning sensation.  Please follow-up with your primary care doctor, call their office today to schedule follow-up appointment.  Please drink plenty water and get plenty of rest.  Get help right away if: You throw up (vomit) or have watery poop (diarrhea), and you cannot eat or drink anything. You have trouble: Talking. Walking. Swallowing. Using your arms, hands, or legs. You feel generally weak. You are not thinking clearly, or you have trouble forming sentences. A friend or family member may notice this. You have: Chest pain. Pain in your belly (abdomen). Shortness of breath. Sweating. Your vision changes. You are bleeding. You have a very bad headache. You have neck pain or a stiff neck. You have a fever. You have any new/concerning or worsening of symptoms These symptoms may be an emergency. Do not wait to see if the symptoms will go away. Get medical help right away. Call your local emergency services (911 in the [place] Do not drive yourself to the hospital.  Please read the additional information packets attached to your discharge summary.  Do not take your medicine if  develop an itchy rash, swelling in your mouth or lips, or difficulty breathing; call 911 and seek immediate emergency medical attention if this occurs.  You may review your lab tests and imaging results in their entirety on your MyChart  account.  Please discuss all results of fully with your primary care provider and other specialist at your follow-up visit.  Note: Portions of this text may have been transcribed using voice recognition software. Every effort was made to ensure accuracy; however, inadvertent computerized transcription errors may still be present.

## 2020-05-30 ENCOUNTER — Encounter: Payer: Self-pay | Admitting: Cardiology

## 2021-01-08 ENCOUNTER — Ambulatory Visit (INDEPENDENT_AMBULATORY_CARE_PROVIDER_SITE_OTHER): Payer: Managed Care, Other (non HMO) | Admitting: Cardiology

## 2021-01-08 ENCOUNTER — Other Ambulatory Visit: Payer: Self-pay

## 2021-01-08 ENCOUNTER — Encounter: Payer: Self-pay | Admitting: Cardiology

## 2021-01-08 VITALS — BP 128/82 | HR 79 | Temp 98.0°F | Resp 17 | Ht 59.0 in | Wt 216.8 lb

## 2021-01-08 DIAGNOSIS — E7800 Pure hypercholesterolemia, unspecified: Secondary | ICD-10-CM

## 2021-01-08 DIAGNOSIS — R0609 Other forms of dyspnea: Secondary | ICD-10-CM

## 2021-01-08 DIAGNOSIS — Z951 Presence of aortocoronary bypass graft: Secondary | ICD-10-CM

## 2021-01-08 DIAGNOSIS — I25118 Atherosclerotic heart disease of native coronary artery with other forms of angina pectoris: Secondary | ICD-10-CM

## 2021-01-08 DIAGNOSIS — I10 Essential (primary) hypertension: Secondary | ICD-10-CM

## 2021-01-08 DIAGNOSIS — Z6841 Body Mass Index (BMI) 40.0 and over, adult: Secondary | ICD-10-CM

## 2021-01-08 MED ORDER — SPIRONOLACTONE 25 MG PO TABS: 25.0000 mg | ORAL_TABLET | ORAL | 2 refills | Status: DC

## 2021-01-08 NOTE — Progress Notes (Signed)
Primary Physician/Referring:  Lin Landsman, MD  Patient ID: [name], female    DOB: [birth year], 50 y.o.   MRN: [id number]  Chief Complaint  Patient presents with   Chest Pain   Congestive Heart Failure   Coronary Artery Disease   HPI:    [name]  is a 50 y.o.  female  with  coronary artery disease and CABG in January 2018 when she presented with pulmonary edema non-ST elevation myocardial infarction.  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  She still has mild residual tingling and numbness in the right upper extremity and feet.  I had not seen her for the past 2.5 years.  She made an appointment to see me due to worsening dyspnea and also leg edema and occasional episodes of chest tightness.  Patient states that over last several months especially 6 months, she has noticed that she tires out easily and get dyspneic.  No PND or orthopnea.  She has had occasional episodes of chest tightness with dyspnea on exertion.     Past Medical History:  Diagnosis Date   CAD (coronary artery disease), native coronary artery 09/30/2018   Coronary artery disease    Headache    due to elevated  BP   History of kidney stones    "passed them"   Hypertension    Hypoalbuminemia due to protein-calorie malnutrition ([place])    Myocardial infarction (HCC)    Non-STEMI (non-ST elevated myocardial infarction) ([place]) 08/07/2016   Pancreatic injury    "damage to pancrease related to gallbladder OR"   Pneumonia X 1   Stroke ([place])    Type II diabetes mellitus ([place])    Past Surgical History:  Procedure Laterality Date   CARDIAC CATHETERIZATION  08/07/2016   CARDIAC CATHETERIZATION N/A 08/07/2016   Procedure: Left Heart Cath and Coronary Angiography;  Surgeon: Belva Crome, MD;  Location: [facility];  Service:  Cardiovascular;  Laterality: N/A;   CARDIAC CATHETERIZATION N/A 08/07/2016   Procedure: Intravascular Pressure Wire/FFR Study;  Surgeon: Belva Crome, MD;  Location: [facility];  Service: Cardiovascular;  Laterality: N/A;   CORONARY ARTERY BYPASS GRAFT N/A 08/09/2016   Procedure: CORONARY ARTERY BYPASS GRAFTING (CABG) x three,SVG to distal circ OM3, SVG to Om1, LIMA to LAD-using left internal mammary artery and right leg greater saphenous vein harvested endoscopically, Sternal plating - right side;  Surgeon: Grace Isaac, MD;  Location: [facility];  Service: Open Heart Surgery;  Laterality: N/A;   FACIAL RECONSTRUCTION SURGERY  2009   "facial fractures"   FRACTURE SURGERY     LAPAROSCOPIC CHOLECYSTECTOMY     TEE WITHOUT CARDIOVERSION N/A 08/09/2016   Procedure: TRANSESOPHAGEAL ECHOCARDIOGRAM (TEE);  Surgeon: Grace Isaac, MD;  Location: [facility];  Service: Open Heart Surgery;  Laterality: N/A;   TUBAL LIGATION     Family History  Problem Relation Age of Onset   Hypertension Mother    Diabetes Mother    Hypertension Father    Diabetes Father    Heart attack Father 8       had CABG   Prostate cancer Father    Stomach cancer Maternal Grandmother    Diabetes Paternal Grandmother        had L amputation   Brain cancer Paternal Grandfather    Hypertension Sister     Social History   Tobacco  Use   Smoking status: Former    Packs/day: 0.50    Years: 20.00    Pack years: 10.00    Types: Cigarettes    Quit date: 12/28/2015    Years since quitting: 5.0   Smokeless tobacco: Never  Substance Use Topics   Alcohol use: No   Marital Status: Married  ROS  Review of Systems  Cardiovascular:  Positive for chest pain, dyspnea on exertion and leg swelling.  Respiratory:  Negative for snoring.   Gastrointestinal:  Negative for melena.  Objective  Blood pressure 128/82, pulse 79, temperature 98 F (36.7 C), temperature source Temporal, resp. rate 17, height _0  (1.499 m), weight  216 lb 12.8 oz (98.3 kg), SpO2 98 %. Body mass index is 43.79 kg/m.  Vitals with BMI 01/08/2021 02/28/2020 02/28/2020  Height _1  - -  Weight 216 lbs 13 oz - -  BMI 38.25 - -  Systolic 053 976 734  Diastolic 82 90 92  Pulse 79 95 75     Physical Exam Neck:     Vascular: No carotid bruit or JVD.  Cardiovascular:     Rate and Rhythm: Normal rate and regular rhythm.     Pulses: Intact distal pulses.     Heart sounds: Normal heart sounds. No murmur heard.   No gallop.  Pulmonary:     Effort: Pulmonary effort is normal.     Breath sounds: Normal breath sounds.  Abdominal:     General: Bowel sounds are normal.     Palpations: Abdomen is soft.  Musculoskeletal:        General: No swelling (Bilateral 1-2+ ankle edema.  Large adipose tissue present.  Lipedema present.).     Laboratory examination:   Recent Labs    02/28/20 0404  NA 135  K 3.8  CL 108  CO2 20*  GLUCOSE 314*  BUN 15  CREATININE 0.87  CALCIUM 9.0  GFRNONAA >60  GFRAA >60   BNP (last 3 results) No results for input(s): BNP in the last 8760 hours.  External labs:   Labs 01/04/2021:  TSH, T3 and T4 uptake normal.  A1c 13.9%.  Total cholesterol 97, triglycerides 79, HDL 46, LDL 35.  Serum glucose 263 mg, BUN 11, creatinine 0.86, EGFR 80 mL.  Sodium 135, potassium 5.0.  CMP otherwise normal.  Hb 12.9/HCT 40.6, platelet 330.  Medications and allergies   Allergies  Allergen Reactions   Eugenol Hives and Other (See Comments)    Burns mouth   Hydrochlorothiazide Anaphylaxis   Morphine Anaphylaxis   Morphine And Related Anaphylaxis   Other Anaphylaxis   Oxycodone-Acetaminophen Anaphylaxis and Nausea And Vomiting   Percocet [Oxycodone-Acetaminophen] Anaphylaxis and Nausea And Vomiting   Shellfish Allergy Anaphylaxis   Sulfa Antibiotics Anaphylaxis   Tomato Anaphylaxis   Amlodipine Hives and Nausea And Vomiting   Nicotine Hives and Swelling    Allergic reaction to patches       Medication prior to  this encounter:   Outpatient Medications Prior to Visit  Medication Sig Dispense Refill   amLODipine-benazepril (LOTREL) 10-40 MG capsule Take 1 capsule by mouth daily.     aspirin EC 81 MG tablet Take 81 mg by mouth 2 (two) times daily.      atorvastatin (LIPITOR) 80 MG tablet Take 1 tablet (80 mg total) by mouth daily at 6 PM. 30 tablet 0   fluticasone (FLONASE) 50 MCG/ACT nasal spray Place 1 spray into both nostrils daily. 16 g 2   furosemide (  LASIX) 20 MG tablet Take 20 mg by mouth 2 (two) times daily as needed for edema.     Insulin Glargine (BASAGLAR KWIKPEN) 100 UNIT/ML SOPN Inject 30 Units into the skin daily as needed (only if high sugar is high).      labetalol (NORMODYNE) 200 MG tablet Take 200 mg by mouth 2 (two) times daily.     meclizine (ANTIVERT) 25 MG tablet Take 1 tablet (25 mg total) by mouth 3 (three) times daily as needed for dizziness. 15 tablet 0   nitroGLYCERIN (NITROSTAT) 0.4 MG SL tablet Place 1 tablet (0.4 mg total) under the tongue every 5 (five) minutes as needed for chest pain. Max 3 doses--call MD if you need to use 2 or more pills 30 tablet 0   potassium chloride (MICRO-K) 10 MEQ CR capsule Take 10 mEq by mouth 2 (two) times daily as needed (With Furosemide).     traMADol (ULTRAM) 50 MG tablet Take 1 tablet (50 mg total) by mouth every 12 (twelve) hours as needed for moderate pain. 15 tablet 0   diltiazem (CARDIZEM CD) 240 MG 24 hr capsule Take 240 mg by mouth daily.     B-D UF III MINI PEN NEEDLES 31G X 5 MM MISC      blood glucose meter kit and supplies KIT Dispense based on patient and insurance preference. Use up to four times daily as directed. (FOR ICD-9 250.00, 250.01). 1 each 0   Insulin Detemir (LEVEMIR) 100 UNIT/ML Pen Inject 30 Units into the skin daily after breakfast. (Patient not taking: No sig reported) 15 mL 0   Insulin Pen Needle (CLICKFINE PEN NEEDLES) 31G X 8 MM MISC 1 application by Does not apply route as directed. 100 each 0   ondansetron  (ZOFRAN ODT) 4 MG disintegrating tablet Take 1 tablet (4 mg total) by mouth every 8 (eight) hours as needed for nausea or vomiting. 20 tablet 0   ONETOUCH VERIO test strip      pantoprazole (PROTONIX) 40 MG tablet Take 1 tablet (40 mg total) by mouth daily before breakfast. (Patient not taking: Reported on 06/19/2017) 30 tablet 0   potassium chloride (K-DUR,KLOR-CON) 10 MEQ tablet Take 2 tablets (20 mEq total) by mouth daily. 60 tablet 0   temazepam (RESTORIL) 15 MG capsule Take 15 mg by mouth at bedtime as needed for sleep.      triamcinolone ointment (KENALOG) 0.5 % Apply 1 application topically 2 (two) times daily. 30 g 0   No facility-administered medications prior to visit.     FINAL MEDICATION AS OF TODAY:   Medications after current encounter Current Outpatient Medications  Medication Instructions   amLODipine-benazepril (LOTREL) 10-40 MG capsule 1 capsule, Oral, Daily   aspirin EC 81 mg, Oral, 2 times daily   atorvastatin (LIPITOR) 80 mg, Oral, Daily-1800   Basaglar KwikPen 30 Units, Subcutaneous, Daily PRN   fluticasone (FLONASE) 50 MCG/ACT nasal spray 1 spray, Each Nare, Daily   furosemide (LASIX) 20 mg, Oral, 2 times daily PRN   labetalol (NORMODYNE) 200 mg, Oral, 2 times daily   meclizine (ANTIVERT) 25 mg, Oral, 3 times daily PRN   nitroGLYCERIN (NITROSTAT) 0.4 mg, Sublingual, Every 5 min PRN, Max 3 doses--call MD if you need to use 2 or more pills   potassium chloride (MICRO-K) 10 MEQ CR capsule 10 mEq, Oral, 2 times daily PRN   spironolactone (ALDACTONE) 25 mg, Oral, BH-each morning   traMADol (ULTRAM) 50 mg, Oral, Every 12 hours PRN  Radiology:   No results found.  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  S/P CABG x 3 08/09/2016:  LIMA-LAD, SVG-OM3, SVG-OM1- Tenny Craw, MD. Presented with pulmonary edema.  Carotid artery duplex 08/20/2018: Minimal stenosis in the  right internal carotid artery (1-15%). No evidence of significant stenosis in the left carotid vessels. Antegrade right vertebral artery flow. Antegrade left vertebral artery flow. Compared to 08/19/17, right ICA stenosis of 16-49% now minimal stenosis. F/U studies if clinically indicated.   Echocardiogram 08/09/2016: Normal LVEF, 60-65%.  Mild mitral annular calcification.  EKG:     EKG 01/08/2021: Normal sinus rhythm with rate of 69 bpm, LAE, normal axis.  Nonspecific T abnormality, may represent lateral wall ischemia versus LVH with repolarization. No significant change from 02/29/2020.   Assessment     ICD-10-CM   1. Coronary artery disease of native artery of native heart with stable angina pectoris ([place])  I25.118 PCV ECHOCARDIOGRAM COMPLETE    PCV MYOCARDIAL PERFUSION WO LEXISCAN    2.  S/P CABG x 31 Jul 2016  Z95.1     3. Dyspnea on exertion  R06.00 Brain natriuretic peptide    PCV ECHOCARDIOGRAM COMPLETE    PCV MYOCARDIAL PERFUSION WO LEXISCAN    spironolactone (ALDACTONE) 25 MG tablet    4. Essential hypertension  I10 EKG 12-Lead    spironolactone (ALDACTONE) 25 MG tablet    Basic metabolic panel    5. Hypercholesteremia  E78.00     6. Class 3 severe obesity due to excess calories with serious comorbidity and body mass index (BMI) of 40.0 to 44.9 in adult [facility])  E66.01    Z68.41        Medications Discontinued During This Encounter  Medication Reason   blood glucose meter kit and supplies KIT Error   Insulin Detemir (LEVEMIR) 100 UNIT/ML Pen Error   Insulin Pen Needle (CLICKFINE PEN NEEDLES) 31G X 8 MM MISC Error   ondansetron (ZOFRAN ODT) 4 MG disintegrating tablet Error   ONETOUCH VERIO test strip Error   pantoprazole (PROTONIX) 40 MG tablet Error   potassium chloride (K-DUR,KLOR-CON) 10 MEQ tablet Error   temazepam (RESTORIL) 15 MG capsule Error   triamcinolone ointment (KENALOG) 0.5 % Error   B-D UF III MINI PEN NEEDLES 31G X 5 MM MISC Error   diltiazem  (CARDIZEM CD) 240 MG 24 hr capsule Duplicate    Meds ordered this encounter  Medications   spironolactone (ALDACTONE) 25 MG tablet    Sig: Take 1 tablet (25 mg total) by mouth every morning.    Dispense:  30 tablet    Refill:  2    Orders Placed This Encounter  Procedures   Brain natriuretic peptide   Basic metabolic panel    Standing Status:   Future    Standing Expiration Date:   01/08/2022   PCV MYOCARDIAL PERFUSION WO LEXISCAN    Standing Status:   Future    Standing Expiration Date:   03/10/2021   EKG 12-Lead   PCV ECHOCARDIOGRAM COMPLETE    Standing Status:   Future    Standing Expiration Date:   01/08/2022   Recommendations:   Shakaria Raphael is a 50 y.o.  female  with  coronary artery disease and CABG in January 2018 when she presented with pulmonary edema non-ST elevation myocardial infarction. Patient has uncontrolled hypertension, uncontrolled diabetes mellitus,  hyperlipidemia, tobacco use disorder quit since January 2018, post CABG  CVA in Jan 2018 with complete neurologic recovery.  She still has mild residual tingling and numbness in the right upper extremity and feet.  Patient made an appointment to see me due to worsening dyspnea over the last several months and also recurrence of leg edema and occasional episodes of chest pain.  Patient has mostly large adipose tissue in her lower extremity with mild ankle edema.  I have discussed with her regarding strict avoidance of fats in her diet.  She is presently taking furosemide, advised her to use it only on a as needed basis for worsening leg edema or dyspnea and I will start her on Aldactone 25 mg daily, will obtain a BMP in 2 weeks.  She is on amlodipine/benazepril combination but also on diltiazem which I discontinued.  This may also be contributing to her leg edema.  I reviewed her external labs, lipids under excellent control.  Diabetes continues to be extremely uncontrolled.  Otherwise normal renal  function.  Her dyspnea is also related to probably acute on chronic diastolic heart failure.  I will repeat echocardiogram and assess progression of CAD with nuclear stress test as it has been >5 years since last stress test.  I would like to see her back in 4 to 6 weeks for follow-up.  This was a 40-minute office visit encounter.   Adrian Prows, MD, [facility] 01/08/2021, 9:54 AM Office: [phone]

## 2021-01-11 LAB — BASIC METABOLIC PANEL
BUN/Creatinine Ratio: 9 (ref 9–23)
BUN: 7 mg/dL (ref 6–24)
CO2: 19 mmol/L — ABNORMAL LOW (ref 20–29)
Calcium: 9.7 mg/dL (ref 8.7–10.2)
Chloride: 98 mmol/L (ref 96–106)
Creatinine, Ser: 0.78 mg/dL (ref 0.57–1.00)
Glucose: 290 mg/dL — ABNORMAL HIGH (ref 65–99)
Potassium: 4.4 mmol/L (ref 3.5–5.2)
Sodium: 137 mmol/L (ref 134–144)
eGFR: 94 mL/min/{1.73_m2} (ref >59)

## 2021-01-11 LAB — BRAIN NATRIURETIC PEPTIDE: BNP: 28.1 pg/mL (ref 0.0–100.0)

## 2021-01-11 NOTE — Progress Notes (Signed)
Very low BNP of 28.1 suggests no evidence of congestive heart failure.  Blood sugar is markedly elevated.  Uncontrolled diabetes.  Kidney function is normal.  Discuss at office visit.

## 2021-01-15 ENCOUNTER — Other Ambulatory Visit: Payer: Self-pay

## 2021-01-15 ENCOUNTER — Ambulatory Visit: Payer: BLUE CROSS/BLUE SHIELD

## 2021-01-15 DIAGNOSIS — R0609 Other forms of dyspnea: Secondary | ICD-10-CM

## 2021-01-15 DIAGNOSIS — I25118 Atherosclerotic heart disease of native coronary artery with other forms of angina pectoris: Secondary | ICD-10-CM

## 2021-01-24 ENCOUNTER — Ambulatory Visit: Payer: BLUE CROSS/BLUE SHIELD

## 2021-01-24 ENCOUNTER — Other Ambulatory Visit: Payer: Self-pay

## 2021-01-24 DIAGNOSIS — I25118 Atherosclerotic heart disease of native coronary artery with other forms of angina pectoris: Secondary | ICD-10-CM

## 2021-01-24 DIAGNOSIS — R0609 Other forms of dyspnea: Secondary | ICD-10-CM

## 2021-01-25 NOTE — Progress Notes (Signed)
Echocardiogram 01/15/2021: Left ventricle cavity is normal in size and wall thickness. Normal global wall motion. Normal LV systolic function with EF 70%. Normal diastolic filling pattern. Mild tricuspid regurgitation. Estimated pulmonary artery systolic pressure 25 mmHg.

## 2021-01-30 NOTE — Progress Notes (Signed)
Lexiscan (Walking with Camelia Phenes) Sestamibi Stress Test 01/24/2021: Equivocal ECG stress. Patient exercised on a Bruce protocol. Exercised for 7:38 minutes, achieved, which is 80% of the maximum predicted heart rate response. METS achieved 7.38. Hence received Lexiscan injection. Normal BP response. There was nonspecific 0.5 mm inferolateral ST segment depression at peak exercise which persisted for >3 minutes into recovery associated with T wave inversion.  This was equivocal for ischemia. There is a very small reversible defect in the inferior and inferoseptal region.  Overall LV systolic function is normal without regional wall motion abnormalities. Stress LV EF: 68%. No previous exam available for comparison. Low risk.

## 2021-02-06 ENCOUNTER — Encounter: Payer: Self-pay | Admitting: Cardiology

## 2021-02-06 ENCOUNTER — Ambulatory Visit: Payer: BLUE CROSS/BLUE SHIELD | Admitting: Cardiology

## 2021-02-06 ENCOUNTER — Other Ambulatory Visit: Payer: Self-pay

## 2021-02-06 VITALS — BP 163/90 | HR 71 | Temp 97.9°F | Resp 17 | Ht 59.0 in | Wt 210.0 lb

## 2021-02-06 DIAGNOSIS — Z951 Presence of aortocoronary bypass graft: Secondary | ICD-10-CM

## 2021-02-06 DIAGNOSIS — R0600 Dyspnea, unspecified: Secondary | ICD-10-CM

## 2021-02-06 DIAGNOSIS — R0609 Other forms of dyspnea: Secondary | ICD-10-CM

## 2021-02-06 DIAGNOSIS — Z794 Long term (current) use of insulin: Secondary | ICD-10-CM

## 2021-02-06 DIAGNOSIS — I10 Essential (primary) hypertension: Secondary | ICD-10-CM

## 2021-02-06 DIAGNOSIS — E119 Type 2 diabetes mellitus without complications: Secondary | ICD-10-CM

## 2021-02-06 DIAGNOSIS — I25118 Atherosclerotic heart disease of native coronary artery with other forms of angina pectoris: Secondary | ICD-10-CM

## 2021-02-06 MED ORDER — EMPAGLIFLOZIN 25 MG PO TABS: 25.0000 mg | ORAL_TABLET | Freq: Every day | ORAL | 2 refills | Status: AC

## 2021-02-06 NOTE — Progress Notes (Signed)
Primary Physician/Referring:  Lin Landsman, MD  Patient ID: [name], female    DOB: [birth year], 50 y.o.   MRN: [id number]  Chief Complaint  Patient presents with   Follow-up    4 WEEKS   Coronary Artery Disease   Shortness of Breath   Leg Swelling   HPI:    [name]  is a 50 y.o.  ffemale with hypertension, hyperlipidemia, uncontrolled diabetes mellitus, history of tobacco use disorder (quit January 2018).  Patient with history of coronary artery disease, status post CABG in January 2018.  Patient also had post CABG CVA with mild residual paresthesias in right upper extremity and feet.    He presents for a 6-week office visit, on her last office visit due to worsening dyspnea, I performed and repeated stress test and an echocardiogram. She presented to the office 01/08/2021 with worsening dyspnea and leg edema as well as occasional episodes of "chest tightness".  At last visit ordered echocardiogram and nuclear stress test as well as BNP and BMP.  Also added spironolactone 25 mg daily, repeat BMP remained stable.  Past Medical History:  Diagnosis Date   CAD (coronary artery disease), native coronary artery 09/30/2018   Coronary artery disease    Headache    due to elevated  BP   History of kidney stones    "passed them"   Hypertension    Hypoalbuminemia due to protein-calorie malnutrition ([place])    Myocardial infarction (HCC)    Non-STEMI (non-ST elevated myocardial infarction) ([place]) 08/07/2016   Pancreatic injury    "damage to pancrease related to gallbladder OR"   Pneumonia X 1   Stroke ([place])    Type II diabetes mellitus ([place])    Past Surgical History:  Procedure Laterality Date   CARDIAC CATHETERIZATION  08/07/2016   CARDIAC CATHETERIZATION N/A 08/07/2016   Procedure: Left Heart Cath and Coronary Angiography;  Surgeon: Belva Crome, MD;  Location: [facility];  Service: Cardiovascular;  Laterality: N/A;   CARDIAC CATHETERIZATION N/A  08/07/2016   Procedure: Intravascular Pressure Wire/FFR Study;  Surgeon: Belva Crome, MD;  Location: [facility];  Service: Cardiovascular;  Laterality: N/A;   CORONARY ARTERY BYPASS GRAFT N/A 08/09/2016   Procedure: CORONARY ARTERY BYPASS GRAFTING (CABG) x three,SVG to distal circ OM3, SVG to Om1, LIMA to LAD-using left internal mammary artery and right leg greater saphenous vein harvested endoscopically, Sternal plating - right side;  Surgeon: Grace Isaac, MD;  Location: [facility];  Service: Open Heart Surgery;  Laterality: N/A;   FACIAL RECONSTRUCTION SURGERY  2009   "facial fractures"   FRACTURE SURGERY     LAPAROSCOPIC CHOLECYSTECTOMY     TEE WITHOUT CARDIOVERSION N/A 08/09/2016   Procedure: TRANSESOPHAGEAL ECHOCARDIOGRAM (TEE);  Surgeon: Grace Isaac, MD;  Location: [facility];  Service: Open Heart Surgery;  Laterality: N/A;   TUBAL LIGATION     Family History  Problem Relation Age of Onset   Hypertension Mother    Diabetes Mother    Hypertension Father    Diabetes Father    Heart attack Father 51       had CABG   Prostate cancer Father    Stomach cancer Maternal Grandmother    Diabetes Paternal Grandmother        had L amputation   Brain cancer Paternal Grandfather    Hypertension Sister     Social History   Tobacco Use   Smoking status: Former    Packs/day: 0.50  Years: 20.00    Pack years: 10.00    Types: Cigarettes    Quit date: 12/28/2015    Years since quitting: 5.1   Smokeless tobacco: Never  Substance Use Topics   Alcohol use: No   Marital Status: Married  ROS  Review of Systems  Cardiovascular:  Positive for chest pain, dyspnea on exertion and leg swelling.  Respiratory:  Negative for snoring.   Gastrointestinal:  Negative for melena.  Objective  Blood pressure (!) 163/90, pulse 71, temperature 97.9 F (36.6 C), temperature source Temporal, resp. rate 17, height '4\' 11"'  (1.499 m), weight 210 lb (95.3 kg), SpO2 98 %. Body mass index is 42.41  kg/m.  Vitals with BMI 02/06/2021 02/06/2021 01/08/2021  Height - '4\' 11"'  '4\' 11"'   Weight - 210 lbs 216 lbs 13 oz  BMI - 39.76 73.41  Systolic 937 902 409  Diastolic 90 93 82  Pulse 71 86 79     Physical Exam Neck:     Vascular: No carotid bruit or JVD.  Cardiovascular:     Rate and Rhythm: Normal rate and regular rhythm.     Pulses: Intact distal pulses.     Heart sounds: Normal heart sounds. No murmur heard.   No gallop.  Pulmonary:     Effort: Pulmonary effort is normal.     Breath sounds: Normal breath sounds.  Abdominal:     General: Bowel sounds are normal.     Palpations: Abdomen is soft.  Musculoskeletal:        General: No swelling (Bilateral 1-2+ ankle edema.  Large adipose tissue present.  Lipedema present.).     Laboratory examination:   CMP Latest Ref Rng & Units 01/10/2021 02/28/2020 06/19/2017  Glucose 65 - 99 mg/dL 290(H) 314(H) 161(H)  BUN 6 - 24 mg/dL '7 15 10  ' Creatinine 0.57 - 1.00 mg/dL 0.78 0.87 0.71  Sodium 134 - 144 mmol/L 137 135 134(L)  Potassium 3.5 - 5.2 mmol/L 4.4 3.8 3.5  Chloride 96 - 106 mmol/L 98 108 102  CO2 20 - 29 mmol/L 19(L) 20(L) 19(L)  Calcium 8.7 - 10.2 mg/dL 9.7 9.0 9.5  Total Protein 6.5 - 8.1 g/dL - 7.2 8.5(H)  Total Bilirubin 0.3 - 1.2 mg/dL - 0.8 1.6(H)  Alkaline Phos 38 - 126 U/L - 94 133(H)  AST 15 - 41 U/L - 18 25  ALT 0 - 44 U/L - 17 18   External labs:   Labs 01/04/2021:  TSH, T3 and T4 uptake normal.  A1c 13.9%.  Total cholesterol 97, triglycerides 79, HDL 46, LDL 35.  Serum glucose 263 mg, BUN 11, creatinine 0.86, EGFR 80 mL.  Sodium 135, potassium 5.0.  CMP otherwise normal.  Hb 12.9/HCT 40.6, platelet 330.  Allergies   Allergies  Allergen Reactions   Eugenol Hives and Other (See Comments)    Burns mouth   Hydrochlorothiazide Anaphylaxis   Morphine Anaphylaxis   Morphine And Related Anaphylaxis   Other Anaphylaxis   Oxycodone-Acetaminophen Anaphylaxis and Nausea And Vomiting   Percocet  [Oxycodone-Acetaminophen] Anaphylaxis and Nausea And Vomiting   Shellfish Allergy Anaphylaxis   Sulfa Antibiotics Anaphylaxis   Tomato Anaphylaxis   Amlodipine Hives and Nausea And Vomiting   Nicotine Hives and Swelling    Allergic reaction to patches     Medications Prior to Visit:   Outpatient Medications Prior to Visit  Medication Sig Dispense Refill   aspirin EC 81 MG tablet Take 81 mg by mouth 2 (two) times daily.  atorvastatin (LIPITOR) 80 MG tablet Take 1 tablet (80 mg total) by mouth daily at 6 PM. 30 tablet 0   diltiazem (CARDIZEM CD) 240 MG 24 hr capsule Take 240 mg by mouth daily.     fluticasone (FLONASE) 50 MCG/ACT nasal spray Place 1 spray into both nostrils daily. 16 g 2   furosemide (LASIX) 20 MG tablet Take 20 mg by mouth 2 (two) times daily as needed for edema.     Insulin Glargine (BASAGLAR KWIKPEN) 100 UNIT/ML SOPN Inject 30 Units into the skin daily as needed (only if high sugar is high).      labetalol (NORMODYNE) 200 MG tablet Take 200 mg by mouth 2 (two) times daily.     montelukast (SINGULAIR) 10 MG tablet Take 10 mg by mouth daily.     nitroGLYCERIN (NITROSTAT) 0.4 MG SL tablet Place 1 tablet (0.4 mg total) under the tongue every 5 (five) minutes as needed for chest pain. Max 3 doses--call MD if you need to use 2 or more pills 30 tablet 0   potassium chloride (MICRO-K) 10 MEQ CR capsule Take 10 mEq by mouth 2 (two) times daily as needed (With Furosemide).     spironolactone (ALDACTONE) 25 MG tablet Take 1 tablet (25 mg total) by mouth every morning. 30 tablet 2   traMADol (ULTRAM) 50 MG tablet Take 1 tablet (50 mg total) by mouth every 12 (twelve) hours as needed for moderate pain. 15 tablet 0   amLODipine-benazepril (LOTREL) 10-40 MG capsule Take 1 capsule by mouth daily.     meclizine (ANTIVERT) 25 MG tablet Take 1 tablet (25 mg total) by mouth 3 (three) times daily as needed for dizziness. 15 tablet 0   No facility-administered medications prior to visit.    Final Medications at End of Visit    Current Meds  Medication Sig   aspirin EC 81 MG tablet Take 81 mg by mouth 2 (two) times daily.    atorvastatin (LIPITOR) 80 MG tablet Take 1 tablet (80 mg total) by mouth daily at 6 PM.   diltiazem (CARDIZEM CD) 240 MG 24 hr capsule Take 240 mg by mouth daily.   empagliflozin (JARDIANCE) 25 MG TABS tablet Take 1 tablet (25 mg total) by mouth daily before breakfast.   fluticasone (FLONASE) 50 MCG/ACT nasal spray Place 1 spray into both nostrils daily.   furosemide (LASIX) 20 MG tablet Take 20 mg by mouth 2 (two) times daily as needed for edema.   Insulin Glargine (BASAGLAR KWIKPEN) 100 UNIT/ML SOPN Inject 30 Units into the skin daily as needed (only if high sugar is high).    labetalol (NORMODYNE) 200 MG tablet Take 200 mg by mouth 2 (two) times daily.   montelukast (SINGULAIR) 10 MG tablet Take 10 mg by mouth daily.   nitroGLYCERIN (NITROSTAT) 0.4 MG SL tablet Place 1 tablet (0.4 mg total) under the tongue every 5 (five) minutes as needed for chest pain. Max 3 doses--call MD if you need to use 2 or more pills   potassium chloride (MICRO-K) 10 MEQ CR capsule Take 10 mEq by mouth 2 (two) times daily as needed (With Furosemide).   spironolactone (ALDACTONE) 25 MG tablet Take 1 tablet (25 mg total) by mouth every morning.   traMADol (ULTRAM) 50 MG tablet Take 1 tablet (50 mg total) by mouth every 12 (twelve) hours as needed for moderate pain.   Radiology:   No results found.  Cardiac Studies:   Coronary angiogram 08/07/2016:  Severe diffuse CAD, nondominant  anomalous right from the left coronary cusp.  S/P CABG x 3 08/09/2016:  LIMA-LAD, SVG-OM3, SVG-OM1- Tenny Craw, MD. Presented with pulmonary edema.  Carotid artery duplex 08/20/2018: Minimal stenosis in the right internal carotid artery (1-15%). No evidence of significant stenosis in the left carotid vessels. Antegrade right vertebral artery flow. Antegrade left vertebral artery  flow. Compared to 08/19/17, right ICA stenosis of 16-49% now minimal stenosis. F/U studies if clinically indicated.  Echocardiogram 01/15/2021: Left ventricle cavity is normal in size and wall thickness. Normal global wall motion. Normal LV systolic function with EF 70%. Normal diastolic fillingpattern. Mild tricuspid regurgitation. Estimated pulmonary artery systolic pressure 48GNOI.  Lexiscan (Walking with Jaci Carrel) Sestamibi Stress Test 01/24/2021: Equivocal ECG stress. Patient exercised on a Bruce protocol. Exercised for 7:38 minutes, achieved, which is 80% of the maximum predicted heart rate response. METS achieved 7.38. Hence received Lexiscan injection. Normal BP response. There was nonspecific 0.5 mm inferolateral ST segment depression at peak exercise which persisted for >3 minutes into recovery associated with T wave inversion.  This was equivocal for ischemia. There is a very small reversible defect in the inferior and inferoseptal region.  Overall LV systolic function is normal without regional wall motion abnormalities. Stress LV EF: 68%. No previous exam available for comparison. Low risk.  EKG:   EKG 01/08/2021: Normal sinus rhythm with rate of 69 bpm, LAE, normal axis.  Nonspecific T abnormality, may represent lateral wall ischemia versus LVH with repolarization. No significant change from 02/29/2020.   Assessment     ICD-10-CM   1. Coronary artery disease of native artery of native heart with stable angina pectoris ([place])  I25.118     2.  S/P CABG x 31 Jul 2016  Z95.1     3. Dyspnea on exertion  R06.00     4. Essential hypertension  I10     5. Class 3 severe obesity due to excess calories with serious comorbidity and body mass index (BMI) of 40.0 to 44.9 in adult (HCC)  E66.01    Z68.41     6. Type 2 diabetes mellitus without complication, with long-term current use of insulin (HCC)  E11.9 empagliflozin (JARDIANCE) 25 MG TABS tablet   Z79.4       Meds ordered this  encounter  Medications   empagliflozin (JARDIANCE) 25 MG TABS tablet    Sig: Take 1 tablet (25 mg total) by mouth daily before breakfast.    Dispense:  30 tablet    Refill:  2   No orders of the defined types were placed in this encounter.  Recommendations:   [name] is a 50 y.o.  female with hypertension, hyperlipidemia, uncontrolled diabetes mellitus, history of tobacco use disorder (quit January 2018).  Patient with history of coronary artery disease, status post CABG in January 2018.  Patient also had post CABG CVA with mild residual paresthesias in right upper extremity and feet.    He presents for a 6-week office visit, on her last office visit due to worsening dyspnea, I performed and repeated stress test and an echocardiogram.  I reviewed the results of the test and reassured her.  Her weight continues to be a major issue along with very much uncontrolled diabetes mellitus, she is presently taking insulin only on a as needed basis.  I have started her on SGLT2 inhibitor, Jardiance from cardiac standpoint as well as diabetes and hopefully this will help with weight loss and asthma and improvement in blood pressure as her blood  pressure is elevated today.  She may be a good candidate for GLP-1 agonist like Victoza as well.  She is tolerating spironolactone, in view of diabetes mellitus, she will also benefit being on an ACE inhibitor or an ARB which I will initiate on her next office visit.  I am extremely concerned, patient is only 50 years of age and has already had bypass surgery at age 65 and has had a stroke previously as well.  I discussed with her regarding extreme high risk from cardiac standpoint and again encouraged her to make lifestyle changes.  Probably best option is for her to join some form of weight loss program.  She may benefit from joining wellness clinic at [facility].  I would like to see her back in 4 weeks for close monitoring.  I spent a total of 45  minutes face-to-face with the patient regarding discussions of extreme high risk for future cardiovascular events.   Adrian Prows, MD, [facility] 02/07/2021, 7:44 AM Office: [phone] Fax: [phone] Pager: [phone]

## 2021-04-01 ENCOUNTER — Other Ambulatory Visit: Payer: Self-pay | Admitting: Cardiology

## 2021-04-01 DIAGNOSIS — R0600 Dyspnea, unspecified: Secondary | ICD-10-CM

## 2021-04-01 DIAGNOSIS — I10 Essential (primary) hypertension: Secondary | ICD-10-CM

## 2021-04-01 DIAGNOSIS — R0609 Other forms of dyspnea: Secondary | ICD-10-CM

## 2021-07-03 ENCOUNTER — Other Ambulatory Visit: Payer: Self-pay | Admitting: Cardiology

## 2021-07-03 DIAGNOSIS — R0609 Other forms of dyspnea: Secondary | ICD-10-CM

## 2021-07-03 DIAGNOSIS — I10 Essential (primary) hypertension: Secondary | ICD-10-CM

## 2021-10-01 ENCOUNTER — Other Ambulatory Visit: Payer: Self-pay | Admitting: Cardiology

## 2021-10-01 DIAGNOSIS — I10 Essential (primary) hypertension: Secondary | ICD-10-CM

## 2021-10-01 DIAGNOSIS — R0609 Other forms of dyspnea: Secondary | ICD-10-CM

## 2022-01-17 ENCOUNTER — Other Ambulatory Visit: Payer: Self-pay | Admitting: Family Medicine

## 2022-01-17 ENCOUNTER — Ambulatory Visit
Admission: RE | Admit: 2022-01-17 | Discharge: 2022-01-17 | Disposition: A | Discharge status: Home / Self Care | Payer: BLUE CROSS/BLUE SHIELD | Source: Ambulatory Visit | Attending: Family Medicine | Admitting: Family Medicine

## 2022-01-17 DIAGNOSIS — R079 Chest pain, unspecified: Secondary | ICD-10-CM

## 2023-01-06 ENCOUNTER — Emergency Department (HOSPITAL_COMMUNITY): Payer: Medicaid Other

## 2023-01-06 ENCOUNTER — Emergency Department (HOSPITAL_COMMUNITY)
Admission: EM | Admit: 2023-01-06 | Discharge: 2023-01-06 | Disposition: A | Discharge status: Home / Self Care | Payer: Medicaid Other | Attending: Emergency Medicine | Admitting: Emergency Medicine

## 2023-01-06 ENCOUNTER — Other Ambulatory Visit: Payer: Self-pay

## 2023-01-06 ENCOUNTER — Encounter (HOSPITAL_COMMUNITY): Payer: Self-pay

## 2023-01-06 DIAGNOSIS — N950 Postmenopausal bleeding: Secondary | ICD-10-CM | POA: Insufficient documentation

## 2023-01-06 DIAGNOSIS — R7309 Other abnormal glucose: Secondary | ICD-10-CM | POA: Diagnosis not present

## 2023-01-06 DIAGNOSIS — R9389 Abnormal findings on diagnostic imaging of other specified body structures: Secondary | ICD-10-CM

## 2023-01-06 DIAGNOSIS — Z7982 Long term (current) use of aspirin: Secondary | ICD-10-CM | POA: Diagnosis not present

## 2023-01-06 DIAGNOSIS — R102 Pelvic and perineal pain: Secondary | ICD-10-CM | POA: Diagnosis not present

## 2023-01-06 DIAGNOSIS — N939 Abnormal uterine and vaginal bleeding, unspecified: Secondary | ICD-10-CM | POA: Diagnosis present

## 2023-01-06 DIAGNOSIS — N8500 Endometrial hyperplasia, unspecified: Secondary | ICD-10-CM | POA: Diagnosis not present

## 2023-01-06 LAB — URINALYSIS, ROUTINE W REFLEX MICROSCOPIC
Bilirubin Urine: NEGATIVE
Glucose, UA: >=500 mg/dL — AB
Ketones, ur: NEGATIVE mg/dL
Leukocytes,Ua: NEGATIVE
Nitrite: NEGATIVE
Protein, ur: 100 mg/dL — AB
RBC / HPF: >50 RBC/hpf (ref 0–5)
Specific Gravity, Urine: 1.031 — ABNORMAL HIGH (ref 1.005–1.030)
pH: 5 (ref 5.0–8.0)

## 2023-01-06 LAB — COMPREHENSIVE METABOLIC PANEL
ALT: 16 U/L (ref 0–44)
AST: 14 U/L — ABNORMAL LOW (ref 15–41)
Albumin: 3.3 g/dL — ABNORMAL LOW (ref 3.5–5.0)
Alkaline Phosphatase: 122 U/L (ref 38–126)
Anion gap: 7 (ref 5–15)
BUN: 10 mg/dL (ref 6–20)
CO2: 22 mmol/L (ref 22–32)
Calcium: 8.1 mg/dL — ABNORMAL LOW (ref 8.9–10.3)
Chloride: 102 mmol/L (ref 98–111)
Creatinine, Ser: 0.66 mg/dL (ref 0.44–1.00)
GFR, Estimated: >60 mL/min (ref >60)
Glucose, Bld: 263 mg/dL — ABNORMAL HIGH (ref 70–99)
Potassium: 3.6 mmol/L (ref 3.5–5.1)
Sodium: 131 mmol/L — ABNORMAL LOW (ref 135–145)
Total Bilirubin: 1.2 mg/dL (ref 0.3–1.2)
Total Protein: 7.3 g/dL (ref 6.5–8.1)

## 2023-01-06 LAB — CBC
HCT: 42 % (ref 36.0–46.0)
Hemoglobin: 13.8 g/dL (ref 12.0–15.0)
MCH: 30.1 pg (ref 26.0–34.0)
MCHC: 32.9 g/dL (ref 30.0–36.0)
MCV: 91.7 fL (ref 80.0–100.0)
Platelets: 281 10*3/uL (ref 150–400)
RBC: 4.58 MIL/uL (ref 3.87–5.11)
RDW: 13 % (ref 11.5–15.5)
WBC: 9.6 10*3/uL (ref 4.0–10.5)
nRBC: 0 % (ref 0.0–0.2)

## 2023-01-06 LAB — HCG, SERUM, QUALITATIVE: Preg, Serum: NEGATIVE

## 2023-01-06 LAB — CBG MONITORING, ED
Glucose-Capillary: 170 mg/dL — ABNORMAL HIGH (ref 70–99)
Glucose-Capillary: 142 mg/dL — ABNORMAL HIGH (ref 70–99)

## 2023-01-06 MED ORDER — FENTANYL CITRATE PF 50 MCG/ML IJ SOSY
50.0000 ug | PREFILLED_SYRINGE | INTRAMUSCULAR | Status: DC | PRN
  Filled 2023-01-06: qty 1

## 2023-01-06 MED ORDER — FENTANYL CITRATE PF 50 MCG/ML IJ SOSY
75.0000 ug | PREFILLED_SYRINGE | INTRAMUSCULAR | Status: DC | PRN
  Administered 2023-01-06: 50 ug via INTRAVENOUS

## 2023-01-06 MED ORDER — INSULIN ASPART 100 UNIT/ML IJ SOLN
10.0000 [IU] | Freq: Once | INTRAMUSCULAR | Status: AC
  Administered 2023-01-06: 10 [IU] via SUBCUTANEOUS
  Filled 2023-01-06: qty 0.1

## 2023-01-06 MED ORDER — ONDANSETRON HCL 4 MG/2ML IJ SOLN
4.0000 mg | Freq: Once | INTRAMUSCULAR | Status: AC
  Administered 2023-01-06: 4 mg via INTRAVENOUS
  Filled 2023-01-06: qty 2

## 2023-01-06 MED ORDER — SODIUM CHLORIDE 0.9 % IV BOLUS: 1000.0000 mL | Freq: Once | INTRAVENOUS | Status: DC

## 2023-01-06 MED ORDER — AMLODIPINE BESYLATE 5 MG PO TABS: 10.0000 mg | ORAL_TABLET | Freq: Once | ORAL | Status: DC

## 2023-01-06 MED ORDER — SODIUM CHLORIDE 0.9 % IV BOLUS
500.0000 mL | Freq: Once | INTRAVENOUS | Status: AC
  Administered 2023-01-06: 500 mL via INTRAVENOUS

## 2023-01-06 MED ORDER — HYDRALAZINE HCL 10 MG PO TABS
10.0000 mg | ORAL_TABLET | Freq: Once | ORAL | Status: AC
  Administered 2023-01-06: 10 mg via ORAL
  Filled 2023-01-06: qty 1

## 2023-01-06 MED ORDER — KETOROLAC TROMETHAMINE 15 MG/ML IJ SOLN
15.0000 mg | Freq: Once | INTRAMUSCULAR | Status: AC
  Administered 2023-01-06: 15 mg via INTRAVENOUS
  Filled 2023-01-06: qty 1

## 2023-01-06 MED ORDER — POLYETHYLENE GLYCOL 3350 17 GM/SCOOP PO POWD: 1.0000 | Freq: Three times a day (TID) | ORAL | 0 refills | Status: AC

## 2023-01-06 MED ORDER — IOHEXOL 300 MG/ML  SOLN
100.0000 mL | Freq: Once | INTRAMUSCULAR | Status: AC | PRN
  Administered 2023-01-06: 100 mL via INTRAVENOUS

## 2023-01-06 MED ORDER — ONDANSETRON 4 MG PO TBDP: 4.0000 mg | ORAL_TABLET | Freq: Three times a day (TID) | ORAL | 0 refills | Status: AC | PRN

## 2023-01-06 NOTE — ED Notes (Signed)
Pt provided diet ginger ale, [place] sandwich and saltines per pt request.

## 2023-01-06 NOTE — ED Notes (Signed)
Pt. Assisted to bathroom for safety. 

## 2023-01-06 NOTE — ED Provider Notes (Signed)
[place] [facility] AT [facility] Provider Note   CSN: [id number] Arrival date & time: 01/06/23  1413     History {Add pertinent medical, surgical, social history, OB history to HPI:1} Chief Complaint  Patient presents with   Vaginal Bleeding   Abdominal Pain    [name] is a 52 y.o. female.   Vaginal Bleeding Associated symptoms: abdominal pain   Abdominal Pain Associated symptoms: vaginal bleeding    Patient is a 52 y/o female who presents to the ED with CC of excessive vaginal bleeding and crampy abdominal pain x 1 day. She reports sudden onset of vaginal bleeding that began yesterday that was accompanied with pain in the lower abdominal region. She has used 16-20 pads to contain the bleeding since it started yesterday. She denies abnormal discharge or odor, dysuria, polyuria, breast changes, or prior history of pelvic or vaginal issues. She is post-menopausal with her last period being at 52 y/o. She is currently sexually active and has tubal ligation as her form of contraception. She has had 3 prior vaginal deliveries without complication. Denies history of STIs for herself or for current partner.   She denies any lightheadedness or dizziness no nausea or vomiting currently but did have some nausea earlier.  No chest pain difficulty breathing.  No fevers or urinary frequency urgency dysuria or hematuria.    Home Medications Prior to Admission medications   Medication Sig Start Date End Date Taking? Authorizing Provider  ondansetron (ZOFRAN-ODT) 4 MG disintegrating tablet Take 1 tablet (4 mg total) by mouth every 8 (eight) hours as needed for nausea or vomiting. 01/06/23  Yes Shadee Rathod S, PA  polyethylene glycol powder (GLYCOLAX/MIRALAX) 17 GM/SCOOP powder Take 255 g by mouth in the morning, at noon, and at bedtime. One scoop in beverage of your choice with each meal. You may increase if you continue to be constipated and decrease if  your stool becomes too loose. 01/06/23  Yes Solon Augusta S, PA  aspirin EC 81 MG tablet Take 81 mg by mouth 2 (two) times daily.     [provider]  atorvastatin (LIPITOR) 80 MG tablet Take 1 tablet (80 mg total) by mouth daily at 6 PM. 08/23/16   Love, Evlyn Kanner, PA-C  diltiazem (CARDIZEM CD) 240 MG 24 hr capsule Take 240 mg by mouth daily. 01/31/21   [provider]  empagliflozin (JARDIANCE) 25 MG TABS tablet Take 1 tablet (25 mg total) by mouth daily before breakfast. 02/06/21   Yates Decamp, MD  fluticasone (FLONASE) 50 MCG/ACT nasal spray Place 1 spray into both nostrils daily. 05/03/18   Georgetta Haber, NP  furosemide (LASIX) 20 MG tablet Take 20 mg by mouth 2 (two) times daily as needed for edema. 01/24/20   [provider]  Insulin Glargine (BASAGLAR KWIKPEN) 100 UNIT/ML SOPN Inject 30 Units into the skin daily as needed (only if high sugar is high).  09/26/18   [provider]  labetalol (NORMODYNE) 200 MG tablet Take 200 mg by mouth 2 (two) times daily.    [provider]  montelukast (SINGULAIR) 10 MG tablet Take 10 mg by mouth daily. 01/18/21   [provider]  nitroGLYCERIN (NITROSTAT) 0.4 MG SL tablet Place 1 tablet (0.4 mg total) under the tongue every 5 (five) minutes as needed for chest pain. Max 3 doses--call MD if you need to use 2 or more pills 08/23/16   Love, Evlyn Kanner, PA-C  potassium chloride (MICRO-K) 10 MEQ CR  capsule Take 10 mEq by mouth 2 (two) times daily as needed (With Furosemide). 02/04/20   [provider]  spironolactone (ALDACTONE) 25 MG tablet TAKE 1 TABLET(25 MG) BY MOUTH EVERY MORNING 10/01/21   Yates Decamp, MD  traMADol (ULTRAM) 50 MG tablet Take 1 tablet (50 mg total) by mouth every 12 (twelve) hours as needed for moderate pain. 10/04/16   Fayrene Helper, PA-C      Allergies    Eugenol, Hydrochlorothiazide, Morphine, Morphine and codeine, Other, Oxycodone-acetaminophen, Percocet [oxycodone-acetaminophen], Shellfish  allergy, Sulfa antibiotics, Tomato, Amlodipine, and Nicotine    Review of Systems   Review of Systems  Gastrointestinal:  Positive for abdominal pain.  Genitourinary:  Positive for vaginal bleeding.    Physical Exam Updated Vital Signs BP (!) 188/79   Pulse 64   Temp 98.9 F (37.2 C)   Resp 16   Ht 4' 11.5" (1.511 m)   Wt 104.7 kg   SpO2 94%   BMI 45.86 kg/m  Physical Exam Vitals and nursing note reviewed.  Constitutional:      General: She is not in acute distress. HENT:     Head: Normocephalic and atraumatic.     Nose: Nose normal.     Mouth/Throat:     Mouth: Mucous membranes are dry.  Eyes:     General: No scleral icterus. Cardiovascular:     Rate and Rhythm: Normal rate and regular rhythm.     Pulses: Normal pulses.     Heart sounds: Normal heart sounds.  Pulmonary:     Effort: Pulmonary effort is normal. No respiratory distress.     Breath sounds: No wheezing.  Abdominal:     Palpations: Abdomen is soft.     Tenderness: There is abdominal tenderness. There is no guarding or rebound.     Comments: Some tenderness suprapubically and in right lower and left lower abdomen.  No focal abdominal tenderness.  No guarding or rebound  Musculoskeletal:     Cervical back: Normal range of motion.     Right lower leg: No edema.     Left lower leg: No edema.  Skin:    General: Skin is warm and dry.     Capillary Refill: Capillary refill takes less than 2 seconds.  Neurological:     Mental Status: She is alert. Mental status is at baseline.  Psychiatric:        Mood and Affect: Mood normal.        Behavior: Behavior normal.     ED Results / Procedures / Treatments   Labs (all labs ordered are listed, but only abnormal results are displayed) Labs Reviewed  COMPREHENSIVE METABOLIC PANEL - Abnormal; Notable for the following components:      Result Value   Sodium 131 (*)    Glucose, Bld 263 (*)    Calcium 8.1 (*)    Albumin 3.3 (*)    AST 14 (*)    All other  components within normal limits  CBG MONITORING, ED - Abnormal; Notable for the following components:   Glucose-Capillary 170 (*)    All other components within normal limits  CBG MONITORING, ED - Abnormal; Notable for the following components:   Glucose-Capillary 142 (*)    All other components within normal limits  CBC  HCG, SERUM, QUALITATIVE  URINALYSIS, ROUTINE W REFLEX MICROSCOPIC  I-STAT BETA HCG BLOOD, ED ([facility], [facility], [facility] ONLY)    EKG None  Radiology CT ABDOMEN PELVIS W CONTRAST  Result Date: 01/06/2023 CLINICAL  DATA:  Acute nonlocalized abdominal pain. Lower abdominal pain and vaginal bleeding. EXAM: CT ABDOMEN AND PELVIS WITH CONTRAST TECHNIQUE: Multidetector CT imaging of the abdomen and pelvis was performed using the standard protocol following bolus administration of intravenous contrast. RADIATION DOSE REDUCTION: This exam was performed according to the departmental dose-optimization program which includes automated exposure control, adjustment of the mA and/or kV according to patient size and/or use of iterative reconstruction technique. CONTRAST:  OMNIPAQUE IOHEXOL 300 MG/ML  SOLN COMPARISON:  Ultrasound pelvis 01/06/2023 FINDINGS: Lower chest: Lung bases are clear.  Sternotomy wires. Hepatobiliary: Mild diffuse fatty infiltration of the liver. No focal lesions. Gallbladder is surgically absent. No bile duct dilatation. Pancreas: Unremarkable. No pancreatic ductal dilatation or surrounding inflammatory changes. Spleen: Normal in size without focal abnormality. Adrenals/Urinary Tract: No adrenal gland nodules. Kidneys are symmetrical. No hydronephrosis or hydroureter. Stone in the lower pole right kidney measuring 3 mm diameter. Bladder is normal. Stomach/Bowel: Stomach is within normal limits. Appendix appears normal. No evidence of bowel wall thickening, distention, or inflammatory changes. Vascular/Lymphatic: Aortic atherosclerosis. No enlarged abdominal or pelvic lymph nodes.  Reproductive: Heterogeneous nodular appearance of the uterus likely representing uterine fibroids. Adenomyosis would be a secondary possibility. No abnormal adnexal masses. Other: No abdominal wall hernia or abnormality. No abdominopelvic ascites. Musculoskeletal: No acute or significant osseous findings. IMPRESSION: 1. No acute process demonstrated in the abdomen or pelvis. 2. 3 mm nonobstructing stone in the lower pole of the right kidney. 3. Fatty infiltration of the liver. 4. Aortic atherosclerosis. 5. Fibroid uterus. Electronically Signed   By: Burman Nieves M.D.   On: 01/06/2023 21:27   US PELVIC COMPLETE WITH TRANSVAGINAL  Result Date: 01/06/2023 CLINICAL DATA:  52 year old with vaginal bleeding. No menstrual period for 1 year. EXAM: TRANSABDOMINAL AND TRANSVAGINAL ULTRASOUND OF PELVIS TECHNIQUE: Both transabdominal and transvaginal ultrasound examinations of the pelvis were performed. Transabdominal technique was performed for global imaging of the pelvis including uterus, ovaries, adnexal regions, and pelvic cul-de-sac. It was necessary to proceed with endovaginal exam following the transabdominal exam to visualize the endometrium and ovaries. COMPARISON:  None Available. FINDINGS: Uterus Measurements: 9.3 x 5 x 6 cm = volume: 146 mL. No fibroids or other mass visualized. Incidental nabothian cyst in the cervix. Endometrium Thickness: 10 mm. Heterogeneous endometrium, technologist notes endometrial contents are mobile. No cine provided clips demonstrating this finding. No discrete focal lesion. Right ovary Measurements: 3 x 1.9 x 3 cm = volume: 8.7 mL. Normal appearance/no adnexal mass. Ovarian blood flow is seen. Left ovary Measurements: 2.6 x 1 x 2 cm = volume: 2.9 mL. Normal appearance/no adnexal mass. Ovarian blood flow is seen. Other findings Trace free fluid in the dependent pelvis. IMPRESSION: 1. Heterogeneous endometrium at 10 mm. This is normal for a premenopausal patient, but thickened for  a postmenopausal patient. For premenopausal patients, if bleeding remains unresponsive to hormonal or medical therapy, sonohysterogram should be considered for focal lesion work-up. For postmenopausal patients, endometrial sampling is indicated to exclude carcinoma. If results are benign, sonohysterogram should be considered for focal lesion work-up. (Ref: Radiological Reasoning: Algorithmic Workup of Abnormal Vaginal Bleeding with Endovaginal Sonography and Sonohysterography. AJR 2008; 161:W96-04) 2. Normal sonographic appearance of the ovaries. Electronically Signed   By: Narda Rutherford M.D.   On: 01/06/2023 18:32    Procedures Procedures  {Document cardiac monitor, telemetry assessment procedure when appropriate:1}  Medications Ordered in ED Medications  fentaNYL (SUBLIMAZE) injection 75 mcg (50 mcg Intravenous Given 01/06/23 2030)  ondansetron (ZOFRAN) injection  4 mg (4 mg Intravenous Given 01/06/23 2027)  insulin aspart (novoLOG) injection 10 Units (10 Units Subcutaneous Given 01/06/23 2028)  hydrALAZINE (APRESOLINE) tablet 10 mg (10 mg Oral Given 01/06/23 2033)  sodium chloride 0.9 % bolus 500 mL (0 mLs Intravenous Stopped 01/06/23 2109)  iohexol (OMNIPAQUE) 300 MG/ML solution 100 mL (100 mLs Intravenous Contrast Given 01/06/23 2101)    ED Course/ Medical Decision Making/ A&P   {   Click here for ABCD2, HEART and other calculatorsREFRESH Note before signing :1}                          Medical Decision Making Amount and/or Complexity of Data Reviewed Labs: ordered. Radiology: ordered.  Risk Prescription drug management.   This patient presents to the ED for concern of abd pain, this involves a number of treatment options, and is a complaint that carries with it a moderate risk of complications and morbidity. A differential diagnosis was considered for the patient's symptoms which is discussed below:   The causes of generalized abdominal pain include but are not limited to AAA,  mesenteric ischemia, appendicitis, diverticulitis, DKA, gastritis, gastroenteritis, AMI, nephrolithiasis, pancreatitis, peritonitis, adrenal insufficiency,lead poisoning, iron toxicity, intestinal ischemia, constipation, UTI,SBO/LBO, splenic rupture, biliary disease, IBD, IBS, PUD, or hepatitis. Ectopic pregnancy, ovarian torsion, PID.    Co morbidities: Discussed in HPI   Brief History:  Patient is a 52 y/o female who presents to the ED with CC of excessive vaginal bleeding and crampy abdominal pain x 1 day. She reports sudden onset of vaginal bleeding that began yesterday that was accompanied with pain in the lower abdominal region. She has used 16-20 pads to contain the bleeding since it started yesterday. She denies abnormal discharge or odor, dysuria, polyuria, breast changes, or prior history of pelvic or vaginal issues. She is post-menopausal with her last period being at 52 y/o. She is currently sexually active and has tubal ligation as her form of contraception. She has had 3 prior vaginal deliveries without complication. Denies history of STIs for herself or for current partner.   She denies any lightheadedness or dizziness no nausea or vomiting currently but did have some nausea earlier.  No chest pain difficulty breathing.  No fevers or urinary frequency urgency dysuria or hematuria.    EMR reviewed including pt PMHx, past surgical history and past visits to ER.   See HPI for more details   Lab Tests:   I ordered and independently interpreted labs. Labs notable for Hyperglycemia without anion gap.  Blood sugar improved to 142 with insulin and some IV hydration.  CBC without leukocytosis or anemia urine pregnancy negative urinalysis ***  Imaging Studies:  Abnormal findings. I personally reviewed all imaging studies. Imaging notable for  IMPRESSION:  1. No acute process demonstrated in the abdomen or pelvis.  2. 3 mm nonobstructing stone in the lower pole of the right  kidney.  3. Fatty infiltration of the liver.  4. Aortic atherosclerosis.  5. Fibroid uterus.   IMPRESSION:  1. Heterogeneous endometrium at 10 mm. This is normal for a  premenopausal patient, but thickened for a postmenopausal patient.  For premenopausal patients, if bleeding remains unresponsive to  hormonal or medical therapy, sonohysterogram should be considered  for focal lesion work-up. For postmenopausal patients, endometrial  sampling is indicated to exclude carcinoma. If results are benign,  sonohysterogram should be considered for focal lesion work-up. (Ref:  Radiological Reasoning: Algorithmic Workup of Abnormal Vaginal  Bleeding with Endovaginal Sonography and Sonohysterography. AJR  2008; 161:W96-04)  2. Normal sonographic appearance of the ovaries.    Cardiac Monitoring:  The patient was maintained on a cardiac monitor.  I personally viewed and interpreted the cardiac monitored which showed an underlying rhythm of: NSR NA   Medicines ordered:  I ordered medication including 500 mL normal saline, hydralazine for blood pressure, insulin, Zofran, fentanyl for pain Reevaluation of the patient after these medicines showed that the patient improved I have reviewed the patients home medicines and have made adjustments as needed   Critical Interventions:     Consults/Attending Physician   I discussed this case with my attending physician who cosigned this note including patient's presenting symptoms, physical exam, and planned diagnostics and interventions. Attending physician stated agreement with plan or made changes to plan which were implemented.   Reevaluation:  After the interventions noted above I re-evaluated patient and found that they have :improved   Social Determinants of Health:      Problem List / ED Course:  Patient with thickened endometrial stripe with reassuring abdominal pain workup with CT abdomen pelvis without remarkable finding.   With vaginal bleeding that is postmenopausal she will need to follow-up with OB/GYN regarding this.  Toradol provided in the ER and will recommend ibuprofen at home.  Return precautions discussed.  No anemia    Dispostion:  After consideration of the diagnostic results and the patients response to treatment, I feel that the patent would benefit from close outpatient follow-up.   Final Clinical Impression(s) / ED Diagnoses Final diagnoses:  Thickened endometrium  Post-menopausal bleeding  Pelvic pain    Rx / DC Orders ED Discharge Orders          Ordered    polyethylene glycol powder (GLYCOLAX/MIRALAX) 17 GM/SCOOP powder  3 times daily        01/06/23 2143    ondansetron (ZOFRAN-ODT) 4 MG disintegrating tablet  Every 8 hours PRN        01/06/23 2143

## 2023-01-06 NOTE — Discharge Instructions (Addendum)
You do have a thickened endometrium which, as we discussed, for postmenopausal women can be concerning for endometrial cancer.  As we discussed and accepts for this or following up with your OB/GYN.  Your workup today did not reveal any emergent diseases or any need for emergent testing however the workup today did reveal the concern for endometrial thickening which will certainly need to be followed up by her OB/GYN.  If you do not have an OB/GYN I have given you the information for an office that will help to get an appointment with an OB/GYN.  Please use Tylenol or ibuprofen for pain.  You may use 600 mg ibuprofen every 6 hours or 1000 mg of Tylenol every 6 hours.  You may choose to alternate between the 2.  This would be most effective.  Not to exceed 4 g of Tylenol within 24 hours.  Not to exceed 3200 mg ibuprofen 24 hours.   Zofran for any nausea experience. I also recommend fiber --MiraLAX 1 cap in beverage of choice once daily you can increase to twice a day and eventually up to 3 times a day titrate this to soft stool.

## 2023-01-06 NOTE — ED Provider Triage Note (Signed)
Emergency Medicine Provider Triage Evaluation Note  [name] , a 52 y.o. female  was evaluated in triage.  Pt complains of lower abdominal pain and vaginal bleeding.  Review of Systems    Physical Exam  BP (!) 212/105   Pulse 83   Temp 98.9 F (37.2 C) (Oral)   Resp 16   Ht 4' 11.5" (1.511 m)   Wt 104.7 kg   SpO2 97%   BMI 45.86 kg/m  Gen:   Awake, no distress   Resp:  Normal effort  MSK:   Moves extremities without difficulty  Other:    Medical Decision Making  Medically screening exam initiated at 3:04 PM.  Appropriate orders placed.  [name] was informed that the remainder of the evaluation will be completed by another provider, this initial triage assessment does not replace that evaluation, and the importance of remaining in the ED until their evaluation is complete.  Patient complaining of constant stabbing suprapubic pain and vaginal bleeding since yesterday. Patient has used 10 pads for bleeding today. LMP >1 year ago. Patient endorses intercourse with one female partner (husband over the past 12 months) and not using condoms or other forms of birth control. Patient thought she already went through menopause. Last bowel movement today was not bloody.  Tenderness to palpation of lower abdomen - no masses felt.  Denies fevers, chest pain, dyspnea, vomit, diarrhea, urinary frequency, hematuria, vaginal discharge.  Ordering CMP, CBC, HCG, and transvaginal US   Dorthy Cooler, [place] 01/06/23 1518

## 2023-01-06 NOTE — ED Triage Notes (Signed)
Pt c/o sharp, lower abdominal pain and increasing vaginal bleeding x1 day.  Pain score 9/10.  Pt reports taking prescription pain medication w/o relief.  Denies dysuria.    Pt reports she had not had a period in over a year.

## 2023-05-17 ENCOUNTER — Emergency Department (HOSPITAL_COMMUNITY)
Admission: EM | Admit: 2023-05-17 | Discharge: 2023-05-17 | Disposition: A | Discharge status: Home / Self Care | Payer: BC Managed Care – PPO | Attending: Emergency Medicine | Admitting: Emergency Medicine

## 2023-05-17 ENCOUNTER — Encounter (HOSPITAL_COMMUNITY): Payer: Self-pay

## 2023-05-17 ENCOUNTER — Emergency Department (HOSPITAL_BASED_OUTPATIENT_CLINIC_OR_DEPARTMENT_OTHER): Payer: BC Managed Care – PPO

## 2023-05-17 ENCOUNTER — Emergency Department (HOSPITAL_COMMUNITY): Payer: BC Managed Care – PPO

## 2023-05-17 ENCOUNTER — Other Ambulatory Visit: Payer: Self-pay

## 2023-05-17 DIAGNOSIS — Z794 Long term (current) use of insulin: Secondary | ICD-10-CM | POA: Diagnosis not present

## 2023-05-17 DIAGNOSIS — E119 Type 2 diabetes mellitus without complications: Secondary | ICD-10-CM | POA: Diagnosis not present

## 2023-05-17 DIAGNOSIS — M7989 Other specified soft tissue disorders: Secondary | ICD-10-CM

## 2023-05-17 DIAGNOSIS — Z951 Presence of aortocoronary bypass graft: Secondary | ICD-10-CM | POA: Insufficient documentation

## 2023-05-17 DIAGNOSIS — Z7982 Long term (current) use of aspirin: Secondary | ICD-10-CM | POA: Insufficient documentation

## 2023-05-17 DIAGNOSIS — Z8673 Personal history of transient ischemic attack (TIA), and cerebral infarction without residual deficits: Secondary | ICD-10-CM | POA: Diagnosis not present

## 2023-05-17 DIAGNOSIS — R601 Generalized edema: Secondary | ICD-10-CM | POA: Insufficient documentation

## 2023-05-17 DIAGNOSIS — R2231 Localized swelling, mass and lump, right upper limb: Secondary | ICD-10-CM | POA: Diagnosis present

## 2023-05-17 MED ORDER — ACETAMINOPHEN 500 MG PO TABS
1000.0000 mg | ORAL_TABLET | Freq: Once | ORAL | Status: AC
  Administered 2023-05-17: 500 mg via ORAL
  Filled 2023-05-17: qty 2

## 2023-05-17 NOTE — Progress Notes (Signed)
Upper extremity venous duplex completed. Please see CV Procedures for preliminary results.  Initial findings reported to Rosamaria Lints, Paramedic.  Shona Simpson, RVT 05/17/23 9:48 AM

## 2023-05-17 NOTE — Discharge Instructions (Addendum)
Evaluation today was overall reassuring.  Ultrasound and x-rays were negative.  Recommend you follow-up with your PCP.  If your swelling gets worse, you notice redness in the arm, worsening pain or your arm becomes pale or cool or any other concerning symptom please return emerged part for further evaluation.

## 2023-05-17 NOTE — ED Provider Notes (Signed)
[place] [facility] AT [facility] Provider Note   CSN: [id number] Arrival date & time: 05/17/23  5643     History  Chief Complaint  Patient presents with   Arm Swelling   HPI [name] is a 52 y.o. 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  Started 2 days ago.  Located about the right dorsal mid forearm and nonradiating upward towards the mid upper arm.  She endorses swelling and pain.  Denies chest pain and shortness of breath.  Denies trauma to the arm.  Denies fever or rash in the arm.  Takes a daily aspirin but no other blood thinning agents.  Overall, concerned that the swelling is worsening.  HPI     Home Medications Prior to Admission medications   Medication Sig Start Date End Date Taking? Authorizing Provider  aspirin EC 81 MG tablet Take 81 mg by mouth 2 (two) times daily.     [provider]  atorvastatin (LIPITOR) 80 MG tablet Take 1 tablet (80 mg total) by mouth daily at 6 PM. 08/23/16   Love, Evlyn Kanner, PA-C  diltiazem (CARDIZEM CD) 240 MG 24 hr capsule Take 240 mg by mouth daily. 01/31/21   [provider]  empagliflozin (JARDIANCE) 25 MG TABS tablet Take 1 tablet (25 mg total) by mouth daily before breakfast. 02/06/21   Yates Decamp, MD  fluticasone (FLONASE) 50 MCG/ACT nasal spray Place 1 spray into both nostrils daily. 05/03/18   Georgetta Haber, NP  furosemide (LASIX) 20 MG tablet Take 20 mg by mouth 2 (two) times daily as needed for edema. 01/24/20   [provider]  Insulin Glargine (BASAGLAR KWIKPEN) 100 UNIT/ML SOPN Inject 30 Units into the skin daily as needed (only if high sugar is high).  09/26/18   [provider]  labetalol (NORMODYNE) 200 MG tablet Take 200 mg by mouth 2 (two) times daily.    [provider]  montelukast (SINGULAIR) 10 MG tablet Take 10 mg by mouth daily. 01/18/21    [provider]  nitroGLYCERIN (NITROSTAT) 0.4 MG SL tablet Place 1 tablet (0.4 mg total) under the tongue every 5 (five) minutes as needed for chest pain. Max 3 doses--call MD if you need to use 2 or more pills 08/23/16   Love, Evlyn Kanner, PA-C  ondansetron (ZOFRAN-ODT) 4 MG disintegrating tablet Take 1 tablet (4 mg total) by mouth every 8 (eight) hours as needed for nausea or vomiting. 01/06/23   Gailen Shelter, PA  polyethylene glycol powder (GLYCOLAX/MIRALAX) 17 GM/SCOOP powder Take 255 g by mouth in the morning, at noon, and at bedtime. One scoop in beverage of your choice with each meal. You may increase if you continue to be constipated and decrease if your stool becomes too loose. 01/06/23   Gailen Shelter, PA  potassium chloride (MICRO-K) 10 MEQ CR capsule Take 10 mEq by mouth 2 (two) times daily as needed (With Furosemide). 02/04/20   [provider]  spironolactone (ALDACTONE) 25 MG tablet TAKE 1 TABLET(25 MG) BY MOUTH EVERY MORNING 10/01/21   Yates Decamp, MD  traMADol (ULTRAM) 50 MG tablet Take 1 tablet (50 mg total) by mouth every 12 (twelve) hours as needed for moderate pain. 10/04/16   Fayrene Helper, PA-C      Allergies    Eugenol, Hydrochlorothiazide, Morphine, Morphine and codeine, Other, Oxycodone-acetaminophen, Percocet [oxycodone-acetaminophen], Shellfish allergy, Sulfa antibiotics, Tomato, Amlodipine,  and Nicotine    Review of Systems   See HPI for pertinent positives   Physical Exam   Vitals:   05/17/23 0811 05/17/23 1022  BP: (!) 175/91 (!) 178/94  Pulse: 79 62  Resp: 18 16  Temp: 98.4 F (36.9 C)   SpO2: 95% 100%    CONSTITUTIONAL:  well-appearing, NAD NEURO:  Alert and oriented x 3, CN 3-12 grossly intact EYES:  eyes equal and reactive ENT/NECK:  Supple, no stridor  CARDIO:  regular rate and rhythm, appears well-perfused  PULM:  No respiratory distress, CTAB GI/GU:  non-distended MSK/SPINE:  No gross deformities, generalized mild edema noted in the  right forearm.  Tenderness palpation to the dorsal aspect of the right forearm extending approximately to the mid upper arm.  Radial pulses are 2+.  Strength and sensation grossly intact bilaterally.  No associated edema, erythema or rash noted.  Both arms symmetrically warm to touch but not hot. SKIN:  no rash, atraumatic  *Additional and/or pertinent findings included in MDM below   ED Results / Procedures / Treatments   Labs (all labs ordered are listed, but only abnormal results are displayed) Labs Reviewed - No data to display  EKG None  Radiology UE VENOUS DUPLEX (7am - 7pm)  Result Date: 05/17/2023 UPPER VENOUS STUDY  Patient Name:  [name]  Date of Exam:   05/17/2023 Medical Rec #: [id number]               Accession #:    [id number] Date of Birth: Jun 29, 1971                Patient Gender: F Patient Age:   33 years Exam Location:  [facility] Procedure:      VAS [place] UPPER EXTREMITY VENOUS DUPLEX Referring Phys: Jonny [address] [place] --------------------------------------------------------------------------------  Risk Factors: None identified. Comparison Study: No prior study Performing Technologist: Shona Simpson  Examination Guidelines: A complete evaluation includes B-mode imaging, spectral Doppler, color Doppler, and power Doppler as needed of all accessible portions of each vessel. Bilateral testing is considered an integral part of a complete examination. Limited examinations for reoccurring indications may be performed as noted.  Right Findings: +----------+------------+---------+-----------+------------------+-------+ RIGHT     CompressiblePhasicitySpontaneous    Properties    Summary +----------+------------+---------+-----------+------------------+-------+ IJV           Full       Yes       Yes                              +----------+------------+---------+-----------+------------------+-------+ Subclavian    Full       Yes       Yes                               +----------+------------+---------+-----------+------------------+-------+ Axillary      Full       Yes       Yes                              +----------+------------+---------+-----------+------------------+-------+ Brachial      Full       Yes       Yes                              +----------+------------+---------+-----------+------------------+-------+ Radial  Full       Yes       Yes                              +----------+------------+---------+-----------+------------------+-------+ Ulnar         Full       Yes       Yes                              +----------+------------+---------+-----------+------------------+-------+ Cephalic      Full       Yes       Yes                              +----------+------------+---------+-----------+------------------+-------+ Basilic       None       No        No     brightly echogenic        +----------+------------+---------+-----------+------------------+-------+ Right Basilic V occluded from wrist to distal upper arm  Left Findings: +----------+------------+---------+-----------+----------+-------+ LEFT      CompressiblePhasicitySpontaneousPropertiesSummary +----------+------------+---------+-----------+----------+-------+ Subclavian    Full       Yes       Yes                      +----------+------------+---------+-----------+----------+-------+  Summary:  Right: No evidence of deep vein thrombosis in the upper extremity. Findings consistent with acute superficial vein thrombosis involving the right basilic vein.  Left: No evidence of thrombosis in the subclavian.  *See table(s) above for measurements and observations.    Preliminary    DG Forearm Right  Result Date: 05/17/2023 CLINICAL DATA:  52 year old female with history of pain and swelling in the right forearm. EXAM: RIGHT FOREARM - 2 VIEW COMPARISON:  No priors. FINDINGS: There is no evidence of fracture or other focal bone  lesions. Enthesophyte extending off the olecranon process of the ulna incidentally noted. Soft tissues are unremarkable. IMPRESSION: Negative. Electronically Signed   By: Trudie Reed M.D.   On: 05/17/2023 09:15    Procedures Procedures    Medications Ordered in ED Medications  acetaminophen (TYLENOL) tablet 1,000 mg (500 mg Oral Given 05/17/23 1020)    ED Course/ Medical Decision Making/ A&P                                 Medical Decision Making Amount and/or Complexity of Data Reviewed Radiology: ordered.  Risk OTC drugs.   52 year old who is well-appearing presenting for right arm swelling.  Exam notable for some mild swelling in the right forearm extending to the right mid upper arm.  DDx includes DVT, PE, PAD, cellulitis, fracture or dislocation.  Overall patient appears well, no acute distress and hemodynamically stable.  Ultrasound was negative.  X-ray was also negative.  Exam does not suggest concern for cellulitis at this time.  Doubt PAD given reassuring pulses and arm overall appears warm and well-perfused.  Suspect MSK etiology.  Advised to follow-up PCP.  Discussed return precautions.  Vital stable.  Discharged home with condition.        Final Clinical Impression(s) / ED Diagnoses Final diagnoses:  Arm swelling    Rx / DC Orders ED Discharge Orders     None  Gareth Eagle, PA-C 05/17/23 1048    Pricilla Loveless, MD 05/17/23 1505

## 2023-05-17 NOTE — ED Triage Notes (Signed)
Pt complaining of right arm pain/swelling noted to be on the posterior side of arm. Some redness present. Pt denies any injuries that could explain it. Present for 3-4 days, pt has tried otc medications for pain with little relief.

## 2024-03-11 ENCOUNTER — Other Ambulatory Visit: Payer: Self-pay

## 2024-03-11 ENCOUNTER — Encounter (HOSPITAL_COMMUNITY): Payer: Self-pay | Admitting: Emergency Medicine

## 2024-03-11 ENCOUNTER — Emergency Department (HOSPITAL_COMMUNITY): Admission: EM | Admit: 2024-03-11 | Discharge: 2024-03-11 | Disposition: A | Discharge status: Home / Self Care

## 2024-03-11 DIAGNOSIS — N390 Urinary tract infection, site not specified: Secondary | ICD-10-CM | POA: Insufficient documentation

## 2024-03-11 DIAGNOSIS — Z794 Long term (current) use of insulin: Secondary | ICD-10-CM | POA: Diagnosis not present

## 2024-03-11 DIAGNOSIS — Z7982 Long term (current) use of aspirin: Secondary | ICD-10-CM | POA: Insufficient documentation

## 2024-03-11 DIAGNOSIS — R300 Dysuria: Secondary | ICD-10-CM | POA: Diagnosis present

## 2024-03-11 LAB — CBC WITH DIFFERENTIAL/PLATELET
Abs Immature Granulocytes: 0.02 K/uL (ref 0.00–0.07)
Basophils Absolute: 0 K/uL (ref 0.0–0.1)
Basophils Relative: 1 %
Eosinophils Absolute: 0.2 K/uL (ref 0.0–0.5)
Eosinophils Relative: 3 %
HCT: 43.3 % (ref 36.0–46.0)
Hemoglobin: 13.9 g/dL (ref 12.0–15.0)
Immature Granulocytes: 0 %
Lymphocytes Relative: 43 %
Lymphs Abs: 3.4 K/uL (ref 0.7–4.0)
MCH: 29.4 pg (ref 26.0–34.0)
MCHC: 32.1 g/dL (ref 30.0–36.0)
MCV: 91.7 fL (ref 80.0–100.0)
Monocytes Absolute: 0.5 K/uL (ref 0.1–1.0)
Monocytes Relative: 6 %
Neutro Abs: 3.8 K/uL (ref 1.7–7.7)
Neutrophils Relative %: 47 %
Platelets: 350 K/uL (ref 150–400)
RBC: 4.72 MIL/uL (ref 3.87–5.11)
RDW: 13.1 % (ref 11.5–15.5)
WBC: 7.9 K/uL (ref 4.0–10.5)
nRBC: 0 % (ref 0.0–0.2)

## 2024-03-11 LAB — COMPREHENSIVE METABOLIC PANEL WITH GFR
ALT: 16 U/L (ref 0–44)
AST: 18 U/L (ref 15–41)
Albumin: 3.5 g/dL (ref 3.5–5.0)
Alkaline Phosphatase: 112 U/L (ref 38–126)
Anion gap: 9 (ref 5–15)
BUN: 13 mg/dL (ref 6–20)
CO2: 23 mmol/L (ref 22–32)
Calcium: 9.2 mg/dL (ref 8.9–10.3)
Chloride: 103 mmol/L (ref 98–111)
Creatinine, Ser: 0.87 mg/dL (ref 0.44–1.00)
GFR, Estimated: >60 mL/min (ref >60)
Glucose, Bld: 151 mg/dL — ABNORMAL HIGH (ref 70–99)
Potassium: 4.1 mmol/L (ref 3.5–5.1)
Sodium: 135 mmol/L (ref 135–145)
Total Bilirubin: 0.9 mg/dL (ref 0.0–1.2)
Total Protein: 7.5 g/dL (ref 6.5–8.1)

## 2024-03-11 LAB — URINALYSIS, ROUTINE W REFLEX MICROSCOPIC
Bilirubin Urine: NEGATIVE
Glucose, UA: NEGATIVE mg/dL
Ketones, ur: NEGATIVE mg/dL
Nitrite: NEGATIVE
Protein, ur: 100 mg/dL — AB
Specific Gravity, Urine: 1.016 (ref 1.005–1.030)
WBC, UA: >50 WBC/hpf (ref 0–5)
pH: 6 (ref 5.0–8.0)

## 2024-03-11 LAB — LIPASE, BLOOD: Lipase: 29 U/L (ref 11–51)

## 2024-03-11 MED ORDER — CEPHALEXIN 500 MG PO CAPS
500.0000 mg | ORAL_CAPSULE | Freq: Once | ORAL | Status: AC
  Administered 2024-03-11: 500 mg via ORAL
  Filled 2024-03-11: qty 1

## 2024-03-11 MED ORDER — CEPHALEXIN 500 MG PO CAPS: 500.0000 mg | ORAL_CAPSULE | Freq: Four times a day (QID) | ORAL | 0 refills | Status: DC

## 2024-03-11 MED ORDER — CEPHALEXIN 500 MG PO CAPS
500.0000 mg | ORAL_CAPSULE | Freq: Four times a day (QID) | ORAL | 0 refills | Status: AC
Start: 2024-03-11 — End: 2024-03-18

## 2024-03-11 NOTE — ED Triage Notes (Signed)
 Patient c/o urinary frequency and strong smell on her urine x 2 weeks. Patient report UC was prescribe oral antibiotics for UTI 2 weeks ago. Patient report antibiotics help but it came back again last night. Patient denies N/V. Patient denies fever at home.

## 2024-03-11 NOTE — ED Notes (Signed)
 Discharge instructions, medications, and follow up care reviewed with and provided to pt. Pt denies any further questions, and has verbalized understanding.

## 2024-03-11 NOTE — ED Notes (Signed)
 Pharmacy updated per pt request.   Gennaro, DO to resend RX to preferred pharmacy.

## 2024-03-11 NOTE — ED Provider Notes (Signed)
 [place] EMERGENCY DEPARTMENT AT [facility] Provider Note   CSN: [id number] Arrival date & time: 03/11/24  1514     Patient presents with: Urinary Frequency   [name] is a 53 y.o. female.   53 year old female presents for evaluation of dysuria and increased urinary frequency.  States that her urine also smells bad.  She states that she noticed this yesterday.  States she recently was on antibiotics for UTI and it seems that her symptoms have recurred.  Denies any other symptoms or concerns at this time.   Urinary Frequency Pertinent negatives include no chest pain, no abdominal pain and no shortness of breath.       Prior to Admission medications   Medication Sig Start Date End Date Taking? Authorizing Provider  cephALEXin  (KEFLEX ) 500 MG capsule Take 1 capsule (500 mg total) by mouth 4 (four) times daily for 7 days. 03/11/24 03/18/24 Yes Kennidi Yoshida L, DO  aspirin  EC 81 MG tablet Take 81 mg by mouth 2 (two) times daily.     [provider]  atorvastatin  (LIPITOR ) 80 MG tablet Take 1 tablet (80 mg total) by mouth daily at 6 PM. 08/23/16   Love, Sharlet RAMAN, PA-C  diltiazem (CARDIZEM CD) 240 MG 24 hr capsule Take 240 mg by mouth daily. 01/31/21   [provider]  empagliflozin  (JARDIANCE ) 25 MG TABS tablet Take 1 tablet (25 mg total) by mouth daily before breakfast. 02/06/21   Ladona Heinz, MD  fluticasone  (FLONASE ) 50 MCG/ACT nasal spray Place 1 spray into both nostrils daily. 05/03/18   Burky, Natalie B, NP  furosemide  (LASIX ) 20 MG tablet Take 20 mg by mouth 2 (two) times daily as needed for edema. 01/24/20   [provider]  Insulin  Glargine (BASAGLAR  KWIKPEN) 100 UNIT/ML SOPN Inject 30 Units into the skin daily as needed (only if high sugar is high).  09/26/18   [provider]  labetalol  (NORMODYNE ) 200 MG tablet Take 200 mg by mouth 2 (two) times daily.    [provider]  montelukast (SINGULAIR) 10 MG tablet Take  10 mg by mouth daily. 01/18/21   [provider]  nitroGLYCERIN  (NITROSTAT ) 0.4 MG SL tablet Place 1 tablet (0.4 mg total) under the tongue every 5 (five) minutes as needed for chest pain. Max 3 doses--call MD if you need to use 2 or more pills 08/23/16   Love, Sharlet RAMAN, PA-C  ondansetron  (ZOFRAN -ODT) 4 MG disintegrating tablet Take 1 tablet (4 mg total) by mouth every 8 (eight) hours as needed for nausea or vomiting. 01/06/23   Neldon Hamp RAMAN, PA  polyethylene glycol powder (GLYCOLAX /MIRALAX ) 17 GM/SCOOP powder Take 255 g by mouth in the morning, at noon, and at bedtime. One scoop in beverage of your choice with each meal. You may increase if you continue to be constipated and decrease if your stool becomes too loose. 01/06/23   Neldon Hamp RAMAN, PA  potassium chloride  (MICRO-K ) 10 MEQ CR capsule Take 10 mEq by mouth 2 (two) times daily as needed (With Furosemide ). 02/04/20   [provider]  spironolactone  (ALDACTONE ) 25 MG tablet TAKE 1 TABLET(25 MG) BY MOUTH EVERY MORNING 10/01/21   Ladona Heinz, MD  traMADol  (ULTRAM ) 50 MG tablet Take 1 tablet (50 mg total) by mouth every 12 (twelve) hours as needed for moderate pain. 10/04/16   Tran, Bowie, PA-C    Allergies: Eugenol, Hydrochlorothiazide, Morphine, Morphine and codeine, Other, Oxycodone -acetaminophen , Percocet [oxycodone -acetaminophen ], Shellfish allergy, Sulfa antibiotics, Tomato, Amlodipine , and Nicotine  Review of Systems  Constitutional:  Negative for chills and fever.  HENT:  Negative for ear pain and sore throat.   Eyes:  Negative for pain and visual disturbance.  Respiratory:  Negative for cough and shortness of breath.   Cardiovascular:  Negative for chest pain and palpitations.  Gastrointestinal:  Negative for abdominal pain and vomiting.  Genitourinary:  Positive for dysuria and frequency. Negative for hematuria.  Musculoskeletal:  Negative for arthralgias and back pain.  Skin:  Negative for color change and rash.   Neurological:  Negative for seizures and syncope.  All other systems reviewed and are negative.   Updated Vital Signs BP (!) 186/96   Pulse 85   Temp 98.3 F (36.8 C) (Oral)   Resp 18   SpO2 97%   Physical Exam Vitals and nursing note reviewed.  Constitutional:      General: She is not in acute distress.    Appearance: Normal appearance. She is well-developed. She is not ill-appearing.  HENT:     Head: Normocephalic and atraumatic.  Eyes:     Conjunctiva/sclera: Conjunctivae normal.  Cardiovascular:     Rate and Rhythm: Normal rate and regular rhythm.     Pulses: Normal pulses.     Heart sounds: Normal heart sounds. No murmur heard. Pulmonary:     Effort: Pulmonary effort is normal. No respiratory distress.     Breath sounds: Normal breath sounds.  Abdominal:     Palpations: Abdomen is soft.     Tenderness: There is no abdominal tenderness.  Musculoskeletal:        General: No swelling.     Cervical back: Neck supple.  Skin:    General: Skin is warm and dry.     Capillary Refill: Capillary refill takes less than 2 seconds.  Neurological:     Mental Status: She is alert.  Psychiatric:        Mood and Affect: Mood normal.     (all labs ordered are listed, but only abnormal results are displayed) Labs Reviewed  COMPREHENSIVE METABOLIC PANEL WITH GFR - Abnormal; Notable for the following components:      Result Value   Glucose, Bld 151 (*)    All other components within normal limits  URINALYSIS, ROUTINE W REFLEX MICROSCOPIC - Abnormal; Notable for the following components:   APPearance CLOUDY (*)    Hgb urine dipstick SMALL (*)    Protein, ur 100 (*)    Leukocytes,Ua LARGE (*)    Bacteria, UA MANY (*)    All other components within normal limits  URINE CULTURE  CBC WITH DIFFERENTIAL/PLATELET  LIPASE, BLOOD    EKG: None  Radiology: No results found.   Procedures   Medications Ordered in the ED  cephALEXin  (KEFLEX ) capsule 500 mg (has no  administration in time range)                                    Medical Decision Making Patient here for dysuria and urinary frequency.  Has UTI and evaluation of labs.  Remainder of labs fairly unremarkable.  Urine culture sent.  She has an allergy to many antibiotics.  Will start her on Keflex  4 times daily for a week.  Given 1 dose here.  Vies close follow-up with primary care and urology.  I provided her with the phone number.  Vies return to the ER for any new or worsening symptoms.  She feels comfortable plan to be discharged home.  Problems Addressed: Urinary tract infection without hematuria, site unspecified: acute illness or injury  Amount and/or Complexity of Data Reviewed External Data Reviewed: notes.    Details: Prior ED records reviewed patient recently had a UTI was treated with Keflex  Labs: ordered. Decision-making details documented in ED Course.    Details: Been reviewed by me and patient with evidence of UTI but CBC and BMP are unremarkable, creatinine is within normal limits  Risk OTC drugs. Prescription drug management.    Final diagnoses:  Urinary tract infection without hematuria, site unspecified    ED Discharge Orders          Ordered    cephALEXin  (KEFLEX ) 500 MG capsule  4 times daily        03/11/24 1745               Danie Hannig, Duwaine CROME, DO 03/11/24 1754

## 2024-03-11 NOTE — Discharge Instructions (Signed)
 Take antibiotics as prescribed.  Follow-up with urology as needed.  Call the office to make an appointment.

## 2024-03-11 NOTE — ED Provider Triage Note (Signed)
 Emergency Medicine Provider Triage Evaluation Note  [name] , a 53 y.o. female  was evaluated in triage.  Pt complains of dysuria. Report having increase urinary frequency and strong urine odor x 3 weeks.  Was treated for a UTI 2 weeks ago with improvement of sxs but now sxs return.  Denies fever, chills, nausea, vomiting, abd pain, or blood in urine.  No vaginal bleeding or vaginal discharge.  Hx of DM  Review of Systems  Positive: As above Negative: As above  Physical Exam  BP (!) 186/96   Pulse 85   Temp 98.3 F (36.8 C) (Oral)   Resp 18   SpO2 97%  Gen:   Awake, no distress   Resp:  Normal effort  MSK:   Moves extremities without difficulty  Other:    Medical Decision Making  Medically screening exam initiated at 4:02 PM.  Appropriate orders placed.  [name] was informed that the remainder of the evaluation will be completed by another provider, this initial triage assessment does not replace that evaluation, and the importance of remaining in the ED until their evaluation is complete.     Lindsey Colon, PA-C 03/11/24 [phone]

## 2024-03-14 ENCOUNTER — Telehealth (HOSPITAL_BASED_OUTPATIENT_CLINIC_OR_DEPARTMENT_OTHER): Payer: Self-pay | Admitting: *Deleted

## 2024-03-14 LAB — URINE CULTURE: Culture: >=100000 — AB

## 2024-03-14 NOTE — Telephone Encounter (Signed)
 Post ED Visit - Positive Culture Follow-up  Culture report reviewed by antimicrobial stewardship pharmacist: [facility] []  Rankin Dee, Pharm.D. []  Venetia Gully, Pharm.D., BCPS AQ-ID []  Garrel Crews, Pharm.D., BCPS []  Almarie Lunger, Pharm.D., BCPS []  [place], [address].D., BCPS, AAHIVP []  Rosaline Bihari, Pharm.D., BCPS, AAHIVP []  Vernell Meier, PharmD, BCPS []  Latanya Hint, PharmD, BCPS []  Donald Medley, PharmD, BCPS []  Rocky Bold, PharmD []  Dorothyann Alert, PharmD, BCPS []  Morene Babe, PharmD  [facility] []  Rosaline Edison, PharmD []  Romona Bliss, PharmD []  Dolphus Roller, PharmD []  Veva Seip, Rph []  Vernell Daunt) Leonce, PharmD []  Eva Allis, PharmD []  Rosaline Millet, PharmD []  Iantha Batch, PharmD []  Arvin Gauss, PharmD []  Wanda Hasting, PharmD []  Ronal Rav, PharmD []  Rocky Slade, PharmD [x]  Camelia Marina, PharmD   Positive urine culture Treated with Cephalexin , organism sensitive to the same and no further patient follow-up is required at this time.  [name] 03/14/2024, 12:45 PM

## 2024-03-15 ENCOUNTER — Telehealth (HOSPITAL_BASED_OUTPATIENT_CLINIC_OR_DEPARTMENT_OTHER): Payer: Self-pay

## 2024-03-15 NOTE — Telephone Encounter (Signed)
 Post ED Visit - Positive Culture Follow-up  Culture report reviewed by antimicrobial stewardship pharmacist: [facility] []  Rankin Dee, Pharm.D. []  Venetia Gully, Pharm.D., BCPS AQ-ID []  Garrel Crews, Pharm.D., BCPS []  Almarie Lunger, Pharm.D., BCPS []  [place], [address].D., BCPS, AAHIVP []  Rosaline Bihari, Pharm.D., BCPS, AAHIVP []  Vernell Meier, PharmD, BCPS []  Latanya Hint, PharmD, BCPS []  Donald Medley, PharmD, BCPS []  Rocky Bold, PharmD []  Dorothyann Alert, PharmD, BCPS []  Morene Babe, PharmD  [facility] [x]  Izetta Carl, PharmD []  Romona Bliss, PharmD []  Dolphus Roller, PharmD []  Veva Seip, Rph []  Vernell Daunt) Leonce, PharmD []  Eva Allis, PharmD []  Rosaline Millet, PharmD []  Iantha Batch, PharmD []  Arvin Gauss, PharmD []  Wanda Hasting, PharmD []  Ronal Rav, PharmD []  Rocky Slade, PharmD []  Bard Jeans, PharmD   Positive urine culture Treated with Cephalexin , organism sensitive to the same and no further patient follow-up is required at this time.  [name] 03/15/2024, 11:57 AM

## 2024-03-17 ENCOUNTER — Encounter (HOSPITAL_COMMUNITY): Payer: Self-pay

## 2024-03-17 ENCOUNTER — Emergency Department (HOSPITAL_COMMUNITY)

## 2024-03-17 ENCOUNTER — Emergency Department (HOSPITAL_COMMUNITY)
Admission: EM | Admit: 2024-03-17 | Discharge: 2024-03-17 | Discharge status: Against Medical Advice | Attending: Emergency Medicine | Admitting: Emergency Medicine

## 2024-03-17 DIAGNOSIS — R1032 Left lower quadrant pain: Secondary | ICD-10-CM | POA: Insufficient documentation

## 2024-03-17 DIAGNOSIS — R110 Nausea: Secondary | ICD-10-CM | POA: Diagnosis not present

## 2024-03-17 DIAGNOSIS — Z5321 Procedure and treatment not carried out due to patient leaving prior to being seen by health care provider: Secondary | ICD-10-CM | POA: Insufficient documentation

## 2024-03-17 LAB — CBC WITH DIFFERENTIAL/PLATELET
Abs Immature Granulocytes: 0.02 K/uL (ref 0.00–0.07)
Basophils Absolute: 0.1 K/uL (ref 0.0–0.1)
Basophils Relative: 1 %
Eosinophils Absolute: 0.3 K/uL (ref 0.0–0.5)
Eosinophils Relative: 3 %
HCT: 43.7 % (ref 36.0–46.0)
Hemoglobin: 14.2 g/dL (ref 12.0–15.0)
Immature Granulocytes: 0 %
Lymphocytes Relative: 45 %
Lymphs Abs: 4 K/uL (ref 0.7–4.0)
MCH: 29.8 pg (ref 26.0–34.0)
MCHC: 32.5 g/dL (ref 30.0–36.0)
MCV: 91.8 fL (ref 80.0–100.0)
Monocytes Absolute: 0.5 K/uL (ref 0.1–1.0)
Monocytes Relative: 6 %
Neutro Abs: 3.9 K/uL (ref 1.7–7.7)
Neutrophils Relative %: 45 %
Platelets: 335 K/uL (ref 150–400)
RBC: 4.76 MIL/uL (ref 3.87–5.11)
RDW: 13 % (ref 11.5–15.5)
WBC: 8.7 K/uL (ref 4.0–10.5)
nRBC: 0 % (ref 0.0–0.2)

## 2024-03-17 LAB — URINALYSIS, ROUTINE W REFLEX MICROSCOPIC
Bacteria, UA: NONE SEEN
Bilirubin Urine: NEGATIVE
Glucose, UA: 150 mg/dL — AB
Hgb urine dipstick: NEGATIVE
Ketones, ur: NEGATIVE mg/dL
Leukocytes,Ua: NEGATIVE
Nitrite: NEGATIVE
Protein, ur: >=300 mg/dL — AB
Specific Gravity, Urine: 1.025 (ref 1.005–1.030)
pH: 5 (ref 5.0–8.0)

## 2024-03-17 LAB — COMPREHENSIVE METABOLIC PANEL WITH GFR
ALT: 17 U/L (ref 0–44)
AST: 17 U/L (ref 15–41)
Albumin: 3.6 g/dL (ref 3.5–5.0)
Alkaline Phosphatase: 113 U/L (ref 38–126)
Anion gap: 7 (ref 5–15)
BUN: 16 mg/dL (ref 6–20)
CO2: 25 mmol/L (ref 22–32)
Calcium: 9.3 mg/dL (ref 8.9–10.3)
Chloride: 104 mmol/L (ref 98–111)
Creatinine, Ser: 0.87 mg/dL (ref 0.44–1.00)
GFR, Estimated: >60 mL/min (ref >60)
Glucose, Bld: 195 mg/dL — ABNORMAL HIGH (ref 70–99)
Potassium: 4.2 mmol/L (ref 3.5–5.1)
Sodium: 136 mmol/L (ref 135–145)
Total Bilirubin: 0.5 mg/dL (ref 0.0–1.2)
Total Protein: 7.6 g/dL (ref 6.5–8.1)

## 2024-03-17 MED ORDER — ONDANSETRON 4 MG PO TBDP
4.0000 mg | ORAL_TABLET | Freq: Once | ORAL | Status: AC
  Administered 2024-03-17: 4 mg via ORAL
  Filled 2024-03-17: qty 1

## 2024-03-17 NOTE — ED Triage Notes (Signed)
 Pt arrived via POV, c/o left sided flank pain. States that she believes it is a kidney stone. Hx of same. States currently getting treatment for UTI

## 2024-03-17 NOTE — ED Notes (Signed)
No answer for room x3 

## 2024-03-17 NOTE — ED Provider Triage Note (Signed)
 Emergency Medicine Provider Triage Evaluation Note  [name] , a 53 y.o. female  was evaluated in triage.  Pt complains of L flank, LLQ abdominal pain, described as sharp, believes this is a kidney stone. Taking keflex  for UTI.   Hx of nephrolithiasis Endorses nausea Denies fevers, headache, vision changes, chest pain, shortness of breath, vomiting, dysuria, hematuria, diarrhea, melena, hematochezia, LE swelling, vaginal bleeding/discharge  Review of Systems  Positive: N/a Negative: N/a  Physical Exam  BP (!) 214/123 (BP Location: Left Arm)   Pulse 80   Temp 98.2 F (36.8 C) (Oral)   Resp 18   SpO2 100%  Gen:   Awake, no distress   Resp:  Normal effort  MSK:   Moves extremities without difficulty  Other:    Medical Decision Making  Medically screening exam initiated at 1:11 PM.  Appropriate orders placed.  Trina Cherry-[name] was informed that the remainder of the evaluation will be completed by another provider, this initial triage assessment does not replace that evaluation, and the importance of remaining in the ED until their evaluation is complete.    Beola Terrall RAMAN, [place] 03/17/24 1314
